# Patient Record
Sex: Female | Born: 1945
Health system: Southern US, Community
[De-identification: ages and names within clinical notes are randomized; demographics above are authoritative.]

## PROBLEM LIST (undated history)

## (undated) DIAGNOSIS — J309 Allergic rhinitis, unspecified: Secondary | ICD-10-CM

## (undated) DIAGNOSIS — K219 Gastro-esophageal reflux disease without esophagitis: Secondary | ICD-10-CM

## (undated) DIAGNOSIS — E119 Type 2 diabetes mellitus without complications: Secondary | ICD-10-CM

## (undated) DIAGNOSIS — Z8679 Personal history of other diseases of the circulatory system: Secondary | ICD-10-CM

## (undated) DIAGNOSIS — E559 Vitamin D deficiency, unspecified: Secondary | ICD-10-CM

## (undated) DIAGNOSIS — J45909 Unspecified asthma, uncomplicated: Secondary | ICD-10-CM

## (undated) DIAGNOSIS — I10 Essential (primary) hypertension: Secondary | ICD-10-CM

## (undated) DIAGNOSIS — K449 Diaphragmatic hernia without obstruction or gangrene: Secondary | ICD-10-CM

## (undated) HISTORY — DX: Personal history of other diseases of the circulatory system: Z86.79

## (undated) HISTORY — DX: Essential (primary) hypertension: I10

## (undated) HISTORY — DX: Vitamin D deficiency, unspecified: E55.9

## (undated) HISTORY — DX: Allergic rhinitis, unspecified: J30.9

## (undated) HISTORY — PX: TUBAL LIGATION: SHX77

---
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
## 1973-04-14 HISTORY — PX: NASAL SEPTUM SURGERY: SHX37

## 1996-04-14 DIAGNOSIS — C50911 Malignant neoplasm of unspecified site of right female breast: Secondary | ICD-10-CM

## 1996-04-14 HISTORY — DX: Malignant neoplasm of unspecified site of right female breast: C50.911

## 1996-04-14 HISTORY — PX: PORTACATH PLACEMENT: SHX2246

## 2000-09-17 ENCOUNTER — Encounter: Payer: Self-pay | Admitting: Family Medicine

## 2000-09-17 ENCOUNTER — Encounter: Admission: RE | Admit: 2000-09-17 | Discharge: 2000-09-17 | Payer: Self-pay | Admitting: Family Medicine

## 2005-04-14 DIAGNOSIS — C50912 Malignant neoplasm of unspecified site of left female breast: Secondary | ICD-10-CM

## 2005-04-14 HISTORY — DX: Malignant neoplasm of unspecified site of left female breast: C50.912

## 2005-10-17 ENCOUNTER — Other Ambulatory Visit: Admission: RE | Admit: 2005-10-17 | Discharge: 2005-10-17 | Payer: Self-pay | Admitting: Family Medicine

## 2007-09-28 ENCOUNTER — Ambulatory Visit (HOSPITAL_BASED_OUTPATIENT_CLINIC_OR_DEPARTMENT_OTHER): Admission: RE | Admit: 2007-09-28 | Discharge: 2007-09-28 | Payer: Self-pay | Admitting: Family Medicine

## 2008-04-14 HISTORY — PX: OTHER SURGICAL HISTORY: SHX169

## 2008-09-06 ENCOUNTER — Other Ambulatory Visit: Admission: RE | Admit: 2008-09-06 | Discharge: 2008-09-06 | Payer: Self-pay | Admitting: Family Medicine

## 2011-01-09 LAB — CREATININE, SERUM
GFR calc Af Amer: >60
GFR calc non Af Amer: >60

## 2011-01-09 LAB — BUN: BUN: 12

## 2014-06-06 DIAGNOSIS — Z853 Personal history of malignant neoplasm of breast: Secondary | ICD-10-CM | POA: Diagnosis not present

## 2014-06-06 DIAGNOSIS — Z0000 Encounter for general adult medical examination without abnormal findings: Secondary | ICD-10-CM | POA: Diagnosis not present

## 2014-06-06 DIAGNOSIS — Z23 Encounter for immunization: Secondary | ICD-10-CM | POA: Diagnosis not present

## 2014-06-06 DIAGNOSIS — I10 Essential (primary) hypertension: Secondary | ICD-10-CM | POA: Diagnosis not present

## 2014-06-06 DIAGNOSIS — E559 Vitamin D deficiency, unspecified: Secondary | ICD-10-CM | POA: Diagnosis not present

## 2014-08-28 DIAGNOSIS — Z853 Personal history of malignant neoplasm of breast: Secondary | ICD-10-CM | POA: Diagnosis not present

## 2014-08-28 DIAGNOSIS — I10 Essential (primary) hypertension: Secondary | ICD-10-CM | POA: Diagnosis not present

## 2014-08-28 DIAGNOSIS — E559 Vitamin D deficiency, unspecified: Secondary | ICD-10-CM | POA: Diagnosis not present

## 2014-08-28 DIAGNOSIS — J309 Allergic rhinitis, unspecified: Secondary | ICD-10-CM | POA: Diagnosis not present

## 2014-08-28 DIAGNOSIS — I472 Ventricular tachycardia: Secondary | ICD-10-CM | POA: Diagnosis not present

## 2014-09-13 DIAGNOSIS — S91112A Laceration without foreign body of left great toe without damage to nail, initial encounter: Secondary | ICD-10-CM | POA: Diagnosis not present

## 2014-09-25 DIAGNOSIS — Z4802 Encounter for removal of sutures: Secondary | ICD-10-CM | POA: Diagnosis not present

## 2014-09-25 DIAGNOSIS — L03032 Cellulitis of left toe: Secondary | ICD-10-CM | POA: Diagnosis not present

## 2015-02-14 DIAGNOSIS — Z23 Encounter for immunization: Secondary | ICD-10-CM | POA: Diagnosis not present

## 2015-06-11 DIAGNOSIS — Z1389 Encounter for screening for other disorder: Secondary | ICD-10-CM | POA: Diagnosis not present

## 2015-06-11 DIAGNOSIS — I10 Essential (primary) hypertension: Secondary | ICD-10-CM | POA: Diagnosis not present

## 2015-06-11 DIAGNOSIS — Z853 Personal history of malignant neoplasm of breast: Secondary | ICD-10-CM | POA: Diagnosis not present

## 2015-06-11 DIAGNOSIS — E559 Vitamin D deficiency, unspecified: Secondary | ICD-10-CM | POA: Diagnosis not present

## 2015-06-11 DIAGNOSIS — Z0000 Encounter for general adult medical examination without abnormal findings: Secondary | ICD-10-CM | POA: Diagnosis not present

## 2015-06-11 DIAGNOSIS — R202 Paresthesia of skin: Secondary | ICD-10-CM | POA: Diagnosis not present

## 2015-07-02 DIAGNOSIS — R05 Cough: Secondary | ICD-10-CM | POA: Diagnosis not present

## 2015-07-02 DIAGNOSIS — J019 Acute sinusitis, unspecified: Secondary | ICD-10-CM | POA: Diagnosis not present

## 2015-07-18 DIAGNOSIS — R109 Unspecified abdominal pain: Secondary | ICD-10-CM | POA: Diagnosis not present

## 2015-07-18 DIAGNOSIS — J019 Acute sinusitis, unspecified: Secondary | ICD-10-CM | POA: Diagnosis not present

## 2015-07-18 DIAGNOSIS — R05 Cough: Secondary | ICD-10-CM | POA: Diagnosis not present

## 2015-09-20 DIAGNOSIS — H2510 Age-related nuclear cataract, unspecified eye: Secondary | ICD-10-CM | POA: Diagnosis not present

## 2015-09-20 DIAGNOSIS — H5200 Hypermetropia, unspecified eye: Secondary | ICD-10-CM | POA: Diagnosis not present

## 2015-09-20 DIAGNOSIS — H5210 Myopia, unspecified eye: Secondary | ICD-10-CM | POA: Diagnosis not present

## 2015-11-07 DIAGNOSIS — H2513 Age-related nuclear cataract, bilateral: Secondary | ICD-10-CM | POA: Diagnosis not present

## 2015-11-07 DIAGNOSIS — H40053 Ocular hypertension, bilateral: Secondary | ICD-10-CM | POA: Diagnosis not present

## 2015-11-07 DIAGNOSIS — Z83511 Family history of glaucoma: Secondary | ICD-10-CM | POA: Diagnosis not present

## 2015-11-07 DIAGNOSIS — H53022 Refractive amblyopia, left eye: Secondary | ICD-10-CM | POA: Diagnosis not present

## 2015-12-31 DIAGNOSIS — R21 Rash and other nonspecific skin eruption: Secondary | ICD-10-CM | POA: Diagnosis not present

## 2015-12-31 DIAGNOSIS — L258 Unspecified contact dermatitis due to other agents: Secondary | ICD-10-CM | POA: Diagnosis not present

## 2015-12-31 DIAGNOSIS — N760 Acute vaginitis: Secondary | ICD-10-CM | POA: Diagnosis not present

## 2015-12-31 DIAGNOSIS — B373 Candidiasis of vulva and vagina: Secondary | ICD-10-CM | POA: Diagnosis not present

## 2015-12-31 DIAGNOSIS — R319 Hematuria, unspecified: Secondary | ICD-10-CM | POA: Diagnosis not present

## 2016-06-16 DIAGNOSIS — J069 Acute upper respiratory infection, unspecified: Secondary | ICD-10-CM | POA: Diagnosis not present

## 2016-06-16 DIAGNOSIS — R6889 Other general symptoms and signs: Secondary | ICD-10-CM | POA: Diagnosis not present

## 2016-07-02 DIAGNOSIS — Z853 Personal history of malignant neoplasm of breast: Secondary | ICD-10-CM | POA: Diagnosis not present

## 2016-07-02 DIAGNOSIS — Z1159 Encounter for screening for other viral diseases: Secondary | ICD-10-CM | POA: Diagnosis not present

## 2016-07-02 DIAGNOSIS — I10 Essential (primary) hypertension: Secondary | ICD-10-CM | POA: Diagnosis not present

## 2016-07-02 DIAGNOSIS — E559 Vitamin D deficiency, unspecified: Secondary | ICD-10-CM | POA: Diagnosis not present

## 2016-07-02 DIAGNOSIS — Z6841 Body Mass Index (BMI) 40.0 and over, adult: Secondary | ICD-10-CM | POA: Diagnosis not present

## 2016-07-02 DIAGNOSIS — Z0000 Encounter for general adult medical examination without abnormal findings: Secondary | ICD-10-CM | POA: Diagnosis not present

## 2017-07-31 DIAGNOSIS — H6091 Unspecified otitis externa, right ear: Secondary | ICD-10-CM | POA: Diagnosis not present

## 2017-07-31 DIAGNOSIS — J209 Acute bronchitis, unspecified: Secondary | ICD-10-CM | POA: Diagnosis not present

## 2018-03-30 DIAGNOSIS — J209 Acute bronchitis, unspecified: Secondary | ICD-10-CM | POA: Diagnosis not present

## 2018-03-30 DIAGNOSIS — J069 Acute upper respiratory infection, unspecified: Secondary | ICD-10-CM | POA: Diagnosis not present

## 2018-04-13 DIAGNOSIS — Z6841 Body Mass Index (BMI) 40.0 and over, adult: Secondary | ICD-10-CM | POA: Diagnosis not present

## 2018-04-13 DIAGNOSIS — J40 Bronchitis, not specified as acute or chronic: Secondary | ICD-10-CM | POA: Diagnosis not present

## 2018-08-06 DIAGNOSIS — I10 Essential (primary) hypertension: Secondary | ICD-10-CM | POA: Diagnosis not present

## 2018-09-27 DIAGNOSIS — Z6841 Body Mass Index (BMI) 40.0 and over, adult: Secondary | ICD-10-CM | POA: Diagnosis not present

## 2018-09-27 DIAGNOSIS — L02412 Cutaneous abscess of left axilla: Secondary | ICD-10-CM | POA: Diagnosis not present

## 2019-01-11 DIAGNOSIS — Z23 Encounter for immunization: Secondary | ICD-10-CM | POA: Diagnosis not present

## 2019-02-02 DIAGNOSIS — Z853 Personal history of malignant neoplasm of breast: Secondary | ICD-10-CM | POA: Diagnosis not present

## 2019-02-02 DIAGNOSIS — R350 Frequency of micturition: Secondary | ICD-10-CM | POA: Diagnosis not present

## 2019-02-02 DIAGNOSIS — E559 Vitamin D deficiency, unspecified: Secondary | ICD-10-CM | POA: Diagnosis not present

## 2019-02-02 DIAGNOSIS — Z0000 Encounter for general adult medical examination without abnormal findings: Secondary | ICD-10-CM | POA: Diagnosis not present

## 2019-02-02 DIAGNOSIS — I10 Essential (primary) hypertension: Secondary | ICD-10-CM | POA: Diagnosis not present

## 2019-02-02 DIAGNOSIS — Z6841 Body Mass Index (BMI) 40.0 and over, adult: Secondary | ICD-10-CM | POA: Diagnosis not present

## 2019-02-02 DIAGNOSIS — R202 Paresthesia of skin: Secondary | ICD-10-CM | POA: Diagnosis not present

## 2019-02-02 DIAGNOSIS — Z1211 Encounter for screening for malignant neoplasm of colon: Secondary | ICD-10-CM | POA: Diagnosis not present

## 2019-02-03 DIAGNOSIS — Z1211 Encounter for screening for malignant neoplasm of colon: Secondary | ICD-10-CM | POA: Diagnosis not present

## 2020-01-24 DIAGNOSIS — Z6841 Body Mass Index (BMI) 40.0 and over, adult: Secondary | ICD-10-CM | POA: Diagnosis not present

## 2020-01-24 DIAGNOSIS — Z0000 Encounter for general adult medical examination without abnormal findings: Secondary | ICD-10-CM | POA: Diagnosis not present

## 2020-01-24 DIAGNOSIS — I10 Essential (primary) hypertension: Secondary | ICD-10-CM | POA: Diagnosis not present

## 2020-01-24 DIAGNOSIS — Z9481 Bone marrow transplant status: Secondary | ICD-10-CM | POA: Diagnosis not present

## 2020-01-24 DIAGNOSIS — Z23 Encounter for immunization: Secondary | ICD-10-CM | POA: Diagnosis not present

## 2020-01-24 DIAGNOSIS — Z1211 Encounter for screening for malignant neoplasm of colon: Secondary | ICD-10-CM | POA: Diagnosis not present

## 2020-01-26 DIAGNOSIS — Z1211 Encounter for screening for malignant neoplasm of colon: Secondary | ICD-10-CM | POA: Diagnosis not present

## 2020-07-17 DIAGNOSIS — R202 Paresthesia of skin: Secondary | ICD-10-CM | POA: Diagnosis not present

## 2020-07-17 DIAGNOSIS — Z6841 Body Mass Index (BMI) 40.0 and over, adult: Secondary | ICD-10-CM | POA: Diagnosis not present

## 2020-07-17 DIAGNOSIS — I10 Essential (primary) hypertension: Secondary | ICD-10-CM | POA: Diagnosis not present

## 2020-07-17 DIAGNOSIS — Z9481 Bone marrow transplant status: Secondary | ICD-10-CM | POA: Diagnosis not present

## 2020-08-02 DIAGNOSIS — Z23 Encounter for immunization: Secondary | ICD-10-CM | POA: Diagnosis not present

## 2020-10-10 ENCOUNTER — Other Ambulatory Visit: Payer: Self-pay

## 2020-10-10 ENCOUNTER — Ambulatory Visit: Payer: Medicare HMO | Admitting: Neurology

## 2020-10-10 ENCOUNTER — Encounter: Payer: Self-pay | Admitting: Neurology

## 2020-10-10 ENCOUNTER — Encounter: Payer: Self-pay | Admitting: *Deleted

## 2020-10-10 VITALS — BP 150/76 | HR 85 | Ht 64.0 in | Wt 224.0 lb

## 2020-10-10 DIAGNOSIS — R292 Abnormal reflex: Secondary | ICD-10-CM | POA: Diagnosis not present

## 2020-10-10 DIAGNOSIS — Z9013 Acquired absence of bilateral breasts and nipples: Secondary | ICD-10-CM

## 2020-10-10 DIAGNOSIS — R270 Ataxia, unspecified: Secondary | ICD-10-CM | POA: Diagnosis not present

## 2020-10-10 DIAGNOSIS — R200 Anesthesia of skin: Secondary | ICD-10-CM

## 2020-10-10 DIAGNOSIS — C50912 Malignant neoplasm of unspecified site of left female breast: Secondary | ICD-10-CM

## 2020-10-10 DIAGNOSIS — R269 Unspecified abnormalities of gait and mobility: Secondary | ICD-10-CM

## 2020-10-10 DIAGNOSIS — R2689 Other abnormalities of gait and mobility: Secondary | ICD-10-CM

## 2020-10-10 DIAGNOSIS — C50911 Malignant neoplasm of unspecified site of right female breast: Secondary | ICD-10-CM

## 2020-10-10 DIAGNOSIS — R29898 Other symptoms and signs involving the musculoskeletal system: Secondary | ICD-10-CM

## 2020-10-10 DIAGNOSIS — R202 Paresthesia of skin: Secondary | ICD-10-CM

## 2020-10-10 DIAGNOSIS — C50919 Malignant neoplasm of unspecified site of unspecified female breast: Secondary | ICD-10-CM | POA: Insufficient documentation

## 2020-10-10 NOTE — Progress Notes (Signed)
[facility]    Provider:  Dr Jaynee Eagles Requesting Provider: Orpah Melter, MD Primary Care Provider:  Orpah Melter, MD  CC:  numbness and tingling in the fingers  HPI:  [name] is a 75 y.o. female here as requested by Orpah Melter, MD for numbness in the fingers (carpal tunnel versus aftereffects of chemotherapy).  Past medical history of paresthesias, morbid obesity, hypertension, right breast cancer 1998 status post modified radical right mastectomy and adjuvant chemotherapy and bone marrow transplant as well as radiation therapy, left breast cancer ductal carcinoma in situ left mastectomy, hysterectomy  Started 4-5 years ago, not coincident with her chemo or radiation in 1998. She worked on the computer for 12 years. Digits 1-4, tingling and numb, numbness is worse at night. Driving makes it worse, she has some neck pain but no radicular symptoms. Dropping things. Feet are ok no sensory symptoms there, no alcohol, progressively getting worse, started in the tips of the fingers, not in the pinkies, nothing makes it better, aggravating but not significantly painful, dropping things makes her mad, feels her coordination. She cannot feel her fingers to button her shirt or make a bow. No blood flow issues, no white fingers or blue fingers ir rashes, some forarm swelling. Some pain in the right deltoid, she feels off balance "like a drunk" she can't stand on one foot, no numbness in the feet. Right hand is worse. Neck pain. Imbalance "walking like a drunk"  Review of Systems: Patient complains of symptoms per HPI as well as the following symptoms imbalance. Pertinent negatives and positives per HPI. All others negative.   Social History   Socioeconomic History   Marital status: Married    Spouse name: Not on file   Number of children: 4   Years of education: Not on file   Highest education level: Some college, no degree  Occupational History   Not on file   Tobacco Use   Smoking status: Former    Packs/day: 1.00    Pack years: 0.00    Types: Cigarettes    Start date: 1962    Quit date: 1998    Years since quitting: 24.5   Smokeless tobacco: Never   Tobacco comments:    At first maybe 1 pack per week then it moved up to 1 ppd later.  Vaping Use   Vaping Use: Never used  Substance and Sexual Activity   Alcohol use: Yes    Comment: 2-3 drinks per week seldom   Drug use: Never   Sexual activity: Not on file  Other Topics Concern   Not on file  Social History Narrative   Lives at home with spouse   Right handed   Caffeine: coffee or tea, 2/day    Social Determinants of Health   Financial Resource Strain: Not on file  Food Insecurity: Not on file  Transportation Needs: Not on file  Physical Activity: Not on file  Stress: Not on file  Social Connections: Not on file  Intimate Partner Violence: Not on file    Family History  Problem Relation Age of Onset   Dementia Mother    Prostate cancer Father    Hypertension Maternal Grandmother    Heart Problems Maternal Grandfather    Diabetes Paternal Grandmother    Heart Problems Paternal Grandmother    Blindness Paternal Grandfather    Breast cancer Paternal Aunt    Neuropathy Neg Hx     Past Medical History:  Diagnosis Date  Allergic rhinitis    Breast cancer, left ([place]) 2007   ductal carcinoma in situ, underwent left mastectomy 07/2005; reconstructive surgery with expander, permanent implant placed 03/2006, hospitalized January 2008 with breast cellulitis.   Breast cancer, right ([place]) [place]; modified radical right mastectomy 1998 for infiltrating ductal adenocarcinoma with 7 of 12 axillary nodes positive. Underwent adjuvant chemotherapy and bone marrow transplant by Dr Marcell Anger as well as radiation therapy. Also had Tram flap reconstruction.   History of ventricular tachycardia    Hypertension    Vitamin D deficiency     Patient Active Problem List   Diagnosis Date  Noted   H/O bilateral mastectomy 10/10/2020   Breast cancer ([place]) 10/10/2020    Past Surgical History:  Procedure Laterality Date   biopsy on scalp  2010   breast implant Left 2007   CESAREAN SECTION     childbirth     63, 66, 67, [place]   hysterectomy with one ovary/due to fibroids  1987   left mastectomy Left 2007   [place]   right mastectomy Right 1998   stem cell infusion  1998   surgery for crossed eyes     TONSILLECTOMY  1961   tran flap R breast Right 1998   tri-port  1998   TUBAL LIGATION      Current Outpatient Medications  Medication Sig Dispense Refill   albuterol (VENTOLIN HFA) 108 (90 Base) MCG/ACT inhaler Inhale 2 puffs into the lungs.     B Complex Vitamins (B COMPLEX PO) Take 1 capsule by mouth.     lisinopril-hydrochlorothiazide (ZESTORETIC) 10-12.5 MG tablet Take 1 tablet by mouth daily.     Multiple Vitamin (MULTIVITAMINS PO) 1 tablet     Naproxen Sodium (ALEVE PO) Take 1 tablet by mouth 2 (two) times daily.     Specialty Vitamins Products (COLLAGEN ULTRA) CAPS See admin instructions.     VITAMIN D PO Take 1,000 Units by mouth in the morning and at bedtime.     No current facility-administered medications for this visit.    Allergies as of 10/10/2020 - Review Complete 10/10/2020  Allergen Reaction Noted   Penicillins Shortness Of Breath and Rash 10/10/2020   Bee venom Swelling 10/10/2020    Vitals: BP (!) 150/76 (BP Location: Left Arm, Patient Position: Sitting, Cuff Size: Large)   Pulse 85   Ht 5\' 4"  (1.626 m)   Wt 224 lb (101.6 kg)   BMI 38.45 kg/m  Last Weight:  Wt Readings from Last 1 Encounters:  10/10/20 224 lb (101.6 kg)   Last Height:   Ht Readings from Last 1 Encounters:  10/10/20 5\' 4"  (1.626 m)     Physical exam: Exam: Gen: NAD, conversant, well nourised, obese, well groomed                     CV: RRR, +SEM . No Carotid Bruits. + peripheral edema, warm,  nontender Eyes: Conjunctivae clear without exudates or hemorrhage  Neuro: Detailed Neurologic Exam  Speech:    Speech is normal; fluent and spontaneous with normal comprehension.  Cognition:    The patient is oriented to person, place, and time;     recent and remote memory intact;     language fluent;     normal attention, concentration,     fund of knowledge Cranial Nerves:    The pupils are equal, round, and  reactive to light. Pupils too small to visualize fundi. Visual fields are full to finger confrontation. Extraocular movements are intact. Trigeminal sensation is intact and the muscles of mastication are normal. The face is symmetric. The palate elevates in the midline. Hearing intact. Voice is normal. Shoulder shrug is normal. The tongue has normal motion without fasciculations.   Coordination:    Normal finger to nose   Gait:    Imbalance with heel/toe, cannot tandem, wide based gait   Motor Observation:    No asymmetry, no atrophy, and no involuntary movements noted. Tone:    Normal muscle tone.    Posture:    Posture is normal.     Strength: right arm prox weakness unclear if due to pain, otherwise strength is V/V in the upper and lower limbs.      Sensation: intact to LT distally in the feet, +Romberg     Reflex Exam:  DTR's: Absent AJs, trace patellars, right biceps 1+, left biceps 2+     Toes:    The toes are equiv  bilaterally.   Clonus:    Clonus is absent.    Assessment/Plan:  Patient with numbness and tingling in the hands which appears to be carpal tunnel but she also has right arm prox weakness, asymmetric reflexes, imbalance when walking, needs further evaluation.   Likely CTS bilaterally: EMG/NCS Imbalance, difficulty with heel/toe walking, ataxia, neck pain: difficult to localize need MRI cervical spine due ot neck pain and assymmteric reflexes to look for myelopathy. Recommended MRi brain as well at this time she wants to hold off.   Orders  Placed This Encounter  Procedures   MR CERVICAL SPINE WO CONTRAST   CBC with Differential/Platelets   Comprehensive metabolic panel   T55 and Folate Panel   Methylmalonic acid, serum   NCV with EMG(electromyography)   No orders of the defined types were placed in this encounter.   Cc: Orpah Melter, MD,  Orpah Melter, MD  Sarina Ill, MD  [facility] [address] [place], [place] [address]  Phone [phone] Fax [phone]

## 2020-10-10 NOTE — Patient Instructions (Signed)
MRI cervical spine EMG/NCS Basic blood work  Nurse, mental health is a test to check how well your muscles and nerves are working. This procedure includes the combined use of electromyogram (EMG) and nerve conduction study (NCS). EMG is used to look for muscular disorders. NCS, which is also called electroneurogram, measures how well your nerves arecontrolling your muscles. The procedures are usually done together to check if your muscles and nerves are healthy. If the results of the tests are abnormal, this may indicatedisease or injury, such as a neuromuscular disease or peripheral nerve damage. Tell a health care provider about: Any allergies you have. All medicines you are taking, including vitamins, herbs, eye drops, creams, and over-the-counter medicines. Any problems you or family members have had with anesthetic medicines. Any blood disorders you have. Any surgeries you have had. Any medical conditions you have. If you have a pacemaker. Whether you are pregnant or may be pregnant. What are the risks? Generally, this is a safe procedure. However, problems may occur, including: Infection where the electrodes were inserted. Bleeding. What happens before the procedure? Medicines Ask your health care provider about: Changing or stopping your regular medicines. This is especially important if you are taking diabetes medicines or blood thinners. Taking medicines such as aspirin and ibuprofen. These medicines can thin your blood. Do not take these medicines unless your health care provider tells you to take them. Taking over-the-counter medicines, vitamins, herbs, and supplements. General instructions Your health care provider may ask you to avoid: Beverages that have caffeine, such as coffee and tea. Any products that contain nicotine or tobacco. These products include cigarettes, e-cigarettes, and chewing tobacco. If you need help quitting, ask your health care  provider. Do not use lotions or creams on the same day that you will be having the procedure. What happens during the procedure? For EMG  Your health care provider will ask you to stay in a position so that he or she can access the muscle that will be studied. You may be standing, sitting, or lying down. You may be given a medicine that numbs the area (local anesthetic). A very thin needle that has an electrode will be inserted into your muscle. Another small electrode will be placed on your skin near the muscle. Your health care provider will ask you to continue to remain still. The electrodes will send a signal that tells about the electrical activity of your muscles. You may see this on a monitor or hear it in the room. After your muscles have been studied at rest, your health care provider will ask you to contract or flex your muscles. The electrodes will send a signal that tells about the electrical activity of your muscles. Your health care provider will remove the electrodes and the electrode needles when the procedure is finished. The procedure may vary among health care providers and hospitals. For NCS  An electrode that records your nerve activity (recording electrode) will be placed on your skin by the muscle that is being studied. An electrode that is used as a reference (reference electrode) will be placed near the recording electrode. A paste or gel will be applied to your skin between the recording electrode and the reference electrode. Your nerve will be stimulated with a mild shock. Your health care provider will measure how much time it takes for your muscle to react. Your health care provider will remove the electrodes and the gel when the procedure is finished. The procedure may vary among health care  providers and hospitals. What happens after the procedure? It is up to you to get the results of your procedure. Ask your health care provider, or the department that is doing  the procedure, when your results will be ready. Your health care provider may: Give you medicines for any pain. Monitor the insertion sites to make sure that bleeding stops. Summary Electromyoneurogram is a test to check how well your muscles and nerves are working. If the results of the tests are abnormal, this may indicate disease or injury. This is a safe procedure. However, problems may occur, such as bleeding and infection. Your health care provider will do two tests to complete this procedure. One checks your muscles (EMG) and another checks your nerves (NCS). It is up to you to get the results of your procedure. Ask your health care provider, or the department that is doing the procedure, when your results will be ready. This information is not intended to replace advice given to you by your health care provider. Make sure you discuss any questions you have with your healthcare provider. Document Revised: 12/15/2017 Document Reviewed: 11/27/2017 Elsevier Patient Education  [place].

## 2020-10-12 LAB — CBC WITH DIFFERENTIAL/PLATELET
Basophils Absolute: 0 10*3/uL (ref 0.0–0.2)
Basos: 1 %
EOS (ABSOLUTE): 0.1 10*3/uL (ref 0.0–0.4)
Eos: 1 %
Hematocrit: 45 % (ref 34.0–46.6)
Hemoglobin: 14.6 g/dL (ref 11.1–15.9)
Immature Grans (Abs): 0 10*3/uL (ref 0.0–0.1)
Immature Granulocytes: 0 %
Lymphocytes Absolute: 1.8 10*3/uL (ref 0.7–3.1)
Lymphs: 27 %
MCH: 27.3 pg (ref 26.6–33.0)
MCHC: 32.4 g/dL (ref 31.5–35.7)
MCV: 84 fL (ref 79–97)
Monocytes Absolute: 0.6 10*3/uL (ref 0.1–0.9)
Monocytes: 9 %
Neutrophils Absolute: 4.2 10*3/uL (ref 1.4–7.0)
Neutrophils: 62 %
Platelets: 183 10*3/uL (ref 150–450)
RBC: 5.34 x10E6/uL — ABNORMAL HIGH (ref 3.77–5.28)
RDW: 14.2 % (ref 11.7–15.4)
WBC: 6.7 10*3/uL (ref 3.4–10.8)

## 2020-10-12 LAB — COMPREHENSIVE METABOLIC PANEL
ALT: 28 IU/L (ref 0–32)
AST: 44 IU/L — ABNORMAL HIGH (ref 0–40)
Albumin/Globulin Ratio: 1.8 (ref 1.2–2.2)
Albumin: 4.4 g/dL (ref 3.7–4.7)
Alkaline Phosphatase: 74 IU/L (ref 44–121)
BUN/Creatinine Ratio: 20 (ref 12–28)
BUN: 13 mg/dL (ref 8–27)
Bilirubin Total: 0.4 mg/dL (ref 0.0–1.2)
CO2: 25 mmol/L (ref 20–29)
Calcium: 9.8 mg/dL (ref 8.7–10.3)
Chloride: 97 mmol/L (ref 96–106)
Creatinine, Ser: 0.66 mg/dL (ref 0.57–1.00)
Globulin, Total: 2.5 g/dL (ref 1.5–4.5)
Glucose: 149 mg/dL — ABNORMAL HIGH (ref 65–99)
Potassium: 4.2 mmol/L (ref 3.5–5.2)
Sodium: 137 mmol/L (ref 134–144)
Total Protein: 6.9 g/dL (ref 6.0–8.5)
eGFR: 91 mL/min/{1.73_m2} (ref >59)

## 2020-10-12 LAB — B12 AND FOLATE PANEL
Folate: >20 ng/mL (ref >3.0)
Vitamin B-12: 515 pg/mL (ref 232–1245)

## 2020-10-12 LAB — METHYLMALONIC ACID, SERUM: Methylmalonic Acid: 193 nmol/L (ref 0–378)

## 2020-10-16 ENCOUNTER — Telehealth: Payer: Self-pay | Admitting: *Deleted

## 2020-10-16 NOTE — Telephone Encounter (Signed)
Called pt and advised lab results unremarkable. She verbalized understanding and appreciation. Her questions were answered.

## 2020-10-16 NOTE — Telephone Encounter (Signed)
-----   Message from Melvenia Beam, MD sent at 10/11/2020  9:09 AM EDT ----- Unremarkable blood work. Thanks, dr Jaynee Eagles

## 2020-10-25 ENCOUNTER — Other Ambulatory Visit: Payer: Self-pay

## 2020-10-25 ENCOUNTER — Ambulatory Visit: Payer: Medicare HMO | Admitting: Neurology

## 2020-10-25 ENCOUNTER — Ambulatory Visit (INDEPENDENT_AMBULATORY_CARE_PROVIDER_SITE_OTHER): Payer: Medicare HMO | Admitting: Neurology

## 2020-10-25 DIAGNOSIS — G5603 Carpal tunnel syndrome, bilateral upper limbs: Secondary | ICD-10-CM | POA: Diagnosis not present

## 2020-10-25 DIAGNOSIS — R200 Anesthesia of skin: Secondary | ICD-10-CM

## 2020-10-25 DIAGNOSIS — Z0289 Encounter for other administrative examinations: Secondary | ICD-10-CM

## 2020-10-25 NOTE — Progress Notes (Addendum)
Full Name: [name] Gender: Female MRN #: [id number] Date of Birth: 1945-11-03    Visit Date: 10/25/2020 08:35 Age: 75 Years Examining Physician: Sarina Ill, MD  Requesting Provider: Orpah Melter, MD Primary Care Provider:  Orpah Melter, MD  History: Patient with numbness and tingling in the hands   Summary: Nerve Conduction Studies were performed on the bilateral upper extremities.  The right median APB motor nerve showed prolonged distal onset latency (13 ms, N<4.4) and reduced amplitude(0.6 mV, N<4). The left  median APB motor nerve showed NR. The right Median 2nd Digit orthodromic sensory nerve showed NR. The left  Median 2nd Digit orthodromic sensory nerve showed NR.  The right median/ulnar (palm) comparison nerve showed prolonged distal peak latency (Median Palm, 6.1 ms, N<2.2) and abnormal peak latency difference (Median Palm-Ulnar Palm, 4.0 ms, N<0.4) with a relative median delay.    The left median/ulnar (palm) comparison nerve showed prolonged distal peak latency (Median Palm, 5.6 ms, N<2.2) and abnormal peak latency difference (Median Palm-Ulnar Palm, 3.6 ms, N<0.4) with a relative median delay.    All remaining nerves (as indicated in the following tables) were within normal limits.  All muscles (as indicated in the following tables) were within normal limits.     Conclusion: This is an abnormal study. There is electrophysiologic evidence of bilateral moderately-severe Carpal Tunnel Syndrome. No suggestion of polyneuropathy or radiculopathy.   Orders Placed This Encounter  Procedures   Ambulatory referral to Orthopedic Surgery        ------------------------------- Sarina Ill, M.D.  [facility] [address], [place],  [address] Tel: [phone] Fax: [phone]  Verbal informed consent was obtained from the patient, patient was informed of potential risk of procedure, including bruising, bleeding, hematoma  formation, infection, muscle weakness, muscle pain, numbness, among others.        [place]    Nerve / Sites Muscle Latency Ref. Amplitude Ref. Rel Amp Segments Distance Velocity Ref. Area    ms ms mV mV %  cm m/s m/s mVms  R Median - APB     Wrist APB 13.0 ?4.4 0.6 ?4.0 100 Wrist - APB 7   1.6     Upper arm APB 17.1  0.2  33.3 Upper arm - Wrist 20 49 ?49 0.8  L Median - APB     Wrist APB NR ?4.4 NR ?4.0 NR Wrist - APB 7   NR     Upper arm APB NR  NR  NR Upper arm - Wrist 20 NR ?49 NR  R Ulnar - ADM     Wrist ADM 2.7 ?3.3 8.3 ?6.0 100 Wrist - ADM 7   26.3     B.Elbow ADM 5.5  7.1  85.8 B.Elbow - Wrist 17 60 ?49 22.9     A.Elbow ADM 7.3  6.3  88.5 A.Elbow - B.Elbow 10 54 ?49 19.2           SNC    Nerve / Sites Rec. Site Peak Lat Ref.  Amp Ref. Segments Distance Peak Diff Ref.    ms ms V V  cm ms ms  R Median, Ulnar - Transcarpal comparison     Median Palm Wrist 6.1 ?2.2 13 ?35 Median Palm - Wrist 8       Ulnar Palm Wrist 2.1 ?2.2 12 ?12 Ulnar Palm - Wrist 8          Median Palm - Ulnar Palm  4.0 ?0.4  L Median, Ulnar - Transcarpal comparison     Median Palm Wrist 5.6 ?2.2 11 ?35 Median Palm - Wrist 8       Ulnar Palm Wrist 2.1 ?2.2 14 ?12 Ulnar Palm - Wrist 8          Median Palm - Ulnar Palm  3.6 ?0.4  R Median - Orthodromic (Dig II, Mid palm)     Dig II Wrist NR ?3.4 NR ?10 Dig II - Wrist 13    L Median - Orthodromic (Dig II, Mid palm)     Dig II Wrist NR ?3.4 NR ?10 Dig II - Wrist 13    R Ulnar - Orthodromic, (Dig V, Mid palm)     Dig V Wrist 2.8 ?3.1 5 ?5 Dig V - Wrist 80                 F  Wave    Nerve F Lat Ref.   ms ms  R Ulnar - ADM 26.9 ?32.0       EMG Summary Table    Spontaneous MUAP Recruitment  Muscle IA Fib PSW Fasc Other Amp Dur. Poly Pattern  L. Cervical paraspinals (low) Normal None None None _______ Normal Normal Normal Normal  R. Cervical paraspinals (low) Normal None None None _______ Normal Normal Normal Normal  L. Deltoid Normal None None None  _______ Normal Normal Normal Normal  R. Deltoid Normal None None None _______ Normal Normal Normal Normal  L. Triceps brachii Normal None None None _______ Normal Normal Normal Normal  R. Triceps brachii Normal None None None _______ Normal Normal Normal Normal  L. Pronator teres Normal None None None _______ Normal Normal Normal Normal  R. Pronator teres Normal None None None _______ Normal Normal Normal Normal  L. First dorsal interosseous Normal None None None _______ Normal Normal Normal Normal  R. First dorsal interosseous Normal None None None _______ Normal Normal Normal Normal  L. Opponens pollicis Normal None None None _______ Normal Normal Normal Normal  R. Opponens pollicis Normal None None None _______ Normal Normal Normal Normal

## 2020-10-25 NOTE — Progress Notes (Signed)
See procedure note.

## 2020-10-25 NOTE — Patient Instructions (Signed)

## 2020-10-28 NOTE — Addendum Note (Signed)
Addended by: Sarina Ill B on: 10/28/2020 11:03 AM   Modules accepted: Orders

## 2020-10-28 NOTE — Procedures (Signed)
Full Name: [name] Gender: Female MRN #: [id number] Date of Birth: 1946/03/04    Visit Date: 10/25/2020 08:35 Age: 75 Years Examining Physician: Sarina Ill, MD  Requesting Provider: Orpah Melter, MD Primary Care Provider:  Orpah Melter, MD  History: Patient with numbness and tingling in the hands   Summary: Nerve Conduction Studies were performed on the bilateral upper extremities.  The right median APB motor nerve showed prolonged distal onset latency (13 ms, N<4.4) and reduced amplitude(0.6 mV, N<4). The left  median APB motor nerve showed NR. The right Median 2nd Digit orthodromic sensory nerve showed NR. The left  Median 2nd Digit orthodromic sensory nerve showed NR.  The right median/ulnar (palm) comparison nerve showed prolonged distal peak latency (Median Palm, 6.1 ms, N<2.2) and abnormal peak latency difference (Median Palm-Ulnar Palm, 4.0 ms, N<0.4) with a relative median delay.    The left median/ulnar (palm) comparison nerve showed prolonged distal peak latency (Median Palm, 5.6 ms, N<2.2) and abnormal peak latency difference (Median Palm-Ulnar Palm, 3.6 ms, N<0.4) with a relative median delay.    All remaining nerves (as indicated in the following tables) were within normal limits.  All muscles (as indicated in the following tables) were within normal limits.     Conclusion: This is an abnormal study. There is electrophysiologic evidence of bilateral moderately-severe Carpal Tunnel Syndrome. No suggestion of polyneuropathy or radiculopathy.      ------------------------------- Sarina Ill, M.D.  [facility] [address], [place], [place] [address] Tel: [phone] Fax: [phone]  Verbal informed consent was obtained from the patient, patient was informed of potential risk of procedure, including bruising, bleeding, hematoma formation, infection, muscle weakness, muscle pain, numbness, among others.        [place]     Nerve / Sites Muscle Latency Ref. Amplitude Ref. Rel Amp Segments Distance Velocity Ref. Area    ms ms mV mV %  cm m/s m/s mVms  R Median - APB     Wrist APB 13.0 ?4.4 0.6 ?4.0 100 Wrist - APB 7   1.6     Upper arm APB 17.1  0.2  33.3 Upper arm - Wrist 20 49 ?49 0.8  L Median - APB     Wrist APB NR ?4.4 NR ?4.0 NR Wrist - APB 7   NR     Upper arm APB NR  NR  NR Upper arm - Wrist 20 NR ?49 NR  R Ulnar - ADM     Wrist ADM 2.7 ?3.3 8.3 ?6.0 100 Wrist - ADM 7   26.3     B.Elbow ADM 5.5  7.1  85.8 B.Elbow - Wrist 17 60 ?49 22.9     A.Elbow ADM 7.3  6.3  88.5 A.Elbow - B.Elbow 10 54 ?49 19.2           SNC    Nerve / Sites Rec. Site Peak Lat Ref.  Amp Ref. Segments Distance Peak Diff Ref.    ms ms V V  cm ms ms  R Median, Ulnar - Transcarpal comparison     Median Palm Wrist 6.1 ?2.2 13 ?35 Median Palm - Wrist 8       Ulnar Palm Wrist 2.1 ?2.2 12 ?12 Ulnar Palm - Wrist 8          Median Palm - Ulnar Palm  4.0 ?0.4  L Median, Ulnar - Transcarpal comparison     Median Palm Wrist 5.6 ?2.2 11 ?35  Median Palm - Wrist 8       Ulnar Palm Wrist 2.1 ?2.2 14 ?12 Ulnar Palm - Wrist 8          Median Palm - Ulnar Palm  3.6 ?0.4  R Median - Orthodromic (Dig II, Mid palm)     Dig II Wrist NR ?3.4 NR ?10 Dig II - Wrist 13    L Median - Orthodromic (Dig II, Mid palm)     Dig II Wrist NR ?3.4 NR ?10 Dig II - Wrist 13    R Ulnar - Orthodromic, (Dig V, Mid palm)     Dig V Wrist 2.8 ?3.1 5 ?5 Dig V - Wrist 25                 F  Wave    Nerve F Lat Ref.   ms ms  R Ulnar - ADM 26.9 ?32.0       EMG Summary Table    Spontaneous MUAP Recruitment  Muscle IA Fib PSW Fasc Other Amp Dur. Poly Pattern  L. Cervical paraspinals (low) Normal None None None _______ Normal Normal Normal Normal  R. Cervical paraspinals (low) Normal None None None _______ Normal Normal Normal Normal  L. Deltoid Normal None None None _______ Normal Normal Normal Normal  R. Deltoid Normal None None None _______ Normal Normal Normal  Normal  L. Triceps brachii Normal None None None _______ Normal Normal Normal Normal  R. Triceps brachii Normal None None None _______ Normal Normal Normal Normal  L. Pronator teres Normal None None None _______ Normal Normal Normal Normal  R. Pronator teres Normal None None None _______ Normal Normal Normal Normal  L. First dorsal interosseous Normal None None None _______ Normal Normal Normal Normal  R. First dorsal interosseous Normal None None None _______ Normal Normal Normal Normal  L. Opponens pollicis Normal None None None _______ Normal Normal Normal Normal  R. Opponens pollicis Normal None None None _______ Normal Normal Normal Normal

## 2020-10-29 ENCOUNTER — Telehealth: Payer: Self-pay | Admitting: Neurology

## 2020-10-29 NOTE — Telephone Encounter (Signed)
Referral faxed to [facility] for Dr. Amedeo Plenty. Phone: [phone].

## 2020-11-03 ENCOUNTER — Other Ambulatory Visit: Payer: Self-pay

## 2020-11-03 ENCOUNTER — Ambulatory Visit
Admission: RE | Admit: 2020-11-03 | Discharge: 2020-11-03 | Disposition: A | Discharge status: Home / Self Care | Payer: Self-pay | Source: Ambulatory Visit | Attending: Neurology | Admitting: Neurology

## 2020-11-03 DIAGNOSIS — R2689 Other abnormalities of gait and mobility: Secondary | ICD-10-CM

## 2020-11-03 DIAGNOSIS — R200 Anesthesia of skin: Secondary | ICD-10-CM | POA: Diagnosis not present

## 2020-11-03 DIAGNOSIS — R270 Ataxia, unspecified: Secondary | ICD-10-CM

## 2020-11-03 DIAGNOSIS — R269 Unspecified abnormalities of gait and mobility: Secondary | ICD-10-CM

## 2020-11-03 DIAGNOSIS — R202 Paresthesia of skin: Secondary | ICD-10-CM

## 2020-11-03 DIAGNOSIS — R29898 Other symptoms and signs involving the musculoskeletal system: Secondary | ICD-10-CM

## 2020-11-03 DIAGNOSIS — R292 Abnormal reflex: Secondary | ICD-10-CM

## 2020-12-13 DIAGNOSIS — G5603 Carpal tunnel syndrome, bilateral upper limbs: Secondary | ICD-10-CM | POA: Diagnosis not present

## 2021-01-04 DIAGNOSIS — G5601 Carpal tunnel syndrome, right upper limb: Secondary | ICD-10-CM | POA: Diagnosis not present

## 2021-01-15 DIAGNOSIS — G5601 Carpal tunnel syndrome, right upper limb: Secondary | ICD-10-CM | POA: Diagnosis not present

## 2021-01-31 DIAGNOSIS — I10 Essential (primary) hypertension: Secondary | ICD-10-CM | POA: Diagnosis not present

## 2021-01-31 DIAGNOSIS — Z23 Encounter for immunization: Secondary | ICD-10-CM | POA: Diagnosis not present

## 2021-01-31 DIAGNOSIS — Z0000 Encounter for general adult medical examination without abnormal findings: Secondary | ICD-10-CM | POA: Diagnosis not present

## 2021-01-31 DIAGNOSIS — Z86010 Personal history of colonic polyps: Secondary | ICD-10-CM | POA: Diagnosis not present

## 2021-01-31 DIAGNOSIS — R32 Unspecified urinary incontinence: Secondary | ICD-10-CM | POA: Diagnosis not present

## 2021-03-15 DIAGNOSIS — N3946 Mixed incontinence: Secondary | ICD-10-CM | POA: Diagnosis not present

## 2021-03-15 DIAGNOSIS — R351 Nocturia: Secondary | ICD-10-CM | POA: Diagnosis not present

## 2021-03-15 DIAGNOSIS — R350 Frequency of micturition: Secondary | ICD-10-CM | POA: Diagnosis not present

## 2021-04-25 DIAGNOSIS — R350 Frequency of micturition: Secondary | ICD-10-CM | POA: Diagnosis not present

## 2021-04-25 DIAGNOSIS — N3946 Mixed incontinence: Secondary | ICD-10-CM | POA: Diagnosis not present

## 2021-05-06 ENCOUNTER — Other Ambulatory Visit: Payer: Self-pay

## 2021-05-06 ENCOUNTER — Encounter (HOSPITAL_COMMUNITY): Payer: Self-pay

## 2021-05-06 ENCOUNTER — Emergency Department (HOSPITAL_COMMUNITY)
Admission: EM | Admit: 2021-05-06 | Discharge: 2021-05-06 | Disposition: A | Discharge status: Home / Self Care | Payer: No Typology Code available for payment source | Attending: Emergency Medicine | Admitting: Emergency Medicine

## 2021-05-06 DIAGNOSIS — W228XXA Striking against or struck by other objects, initial encounter: Secondary | ICD-10-CM | POA: Diagnosis not present

## 2021-05-06 DIAGNOSIS — Y900 Blood alcohol level of less than 20 mg/100 ml: Secondary | ICD-10-CM | POA: Diagnosis not present

## 2021-05-06 DIAGNOSIS — R58 Hemorrhage, not elsewhere classified: Secondary | ICD-10-CM | POA: Diagnosis not present

## 2021-05-06 DIAGNOSIS — S8992XA Unspecified injury of left lower leg, initial encounter: Secondary | ICD-10-CM | POA: Diagnosis present

## 2021-05-06 DIAGNOSIS — Z23 Encounter for immunization: Secondary | ICD-10-CM | POA: Insufficient documentation

## 2021-05-06 DIAGNOSIS — S81812A Laceration without foreign body, left lower leg, initial encounter: Secondary | ICD-10-CM | POA: Diagnosis not present

## 2021-05-06 DIAGNOSIS — I10 Essential (primary) hypertension: Secondary | ICD-10-CM | POA: Diagnosis not present

## 2021-05-06 MED ORDER — TETANUS-DIPHTH-ACELL PERTUSSIS 5-2.5-18.5 LF-MCG/0.5 IM SUSY
0.5000 mL | PREFILLED_SYRINGE | Freq: Once | INTRAMUSCULAR | Status: AC
  Administered 2021-05-06: 0.5 mL via INTRAMUSCULAR
  Filled 2021-05-06: qty 0.5

## 2021-05-06 MED ORDER — LIDOCAINE-EPINEPHRINE (PF) 2 %-1:200000 IJ SOLN
10.0000 mL | Freq: Once | INTRAMUSCULAR | Status: AC
  Administered 2021-05-06: 10 mL
  Filled 2021-05-06: qty 20

## 2021-05-06 NOTE — ED Triage Notes (Signed)
Patient was at work, hit left lower leg on desk drawer, laceration to noted to left shin area.

## 2021-05-06 NOTE — Discharge Instructions (Signed)

## 2021-05-06 NOTE — ED Provider Notes (Signed)
[facility] EMERGENCY DEPARTMENT Provider Note   CSN: [id number] Arrival date & time: 05/06/21  1412     History  Chief Complaint  Patient presents with   Laceration    Jessica KAUFMANN is a 76 y.o. female who presents emergency department with a chief complaint of laceration to the left lower extremity.  She was at work today when she hit her leg on the corner of a desk suffering triangular laceration to the left lower extremity.  She is not up-to-date on her tetanus immunization.  She has no active bleeding at this time.   Laceration     Home Medications Prior to Admission medications   Medication Sig Start Date End Date Taking? Authorizing Provider  albuterol (VENTOLIN HFA) 108 (90 Base) MCG/ACT inhaler Inhale 2 puffs into the lungs. 04/13/18   [provider]  B Complex Vitamins (B COMPLEX PO) Take 1 capsule by mouth.    [provider]  lisinopril-hydrochlorothiazide (ZESTORETIC) 10-12.5 MG tablet Take 1 tablet by mouth daily. 07/10/20   [provider]  Multiple Vitamin (MULTIVITAMINS PO) 1 tablet    [provider]  Naproxen Sodium (ALEVE PO) Take 1 tablet by mouth 2 (two) times daily.    [provider]  Specialty Vitamins Products (COLLAGEN ULTRA) CAPS See admin instructions.    [provider]  VITAMIN D PO Take 1,000 Units by mouth in the morning and at bedtime. 09/07/08   [provider]      Allergies    Penicillins and Bee venom    Review of Systems   Review of Systems Per HPI Physical Exam Updated Vital Signs BP (!) 163/77 (BP Location: Left Arm)    Pulse 85    Temp 98.6 F (37 C) (Oral)    Ht 5\' 4"  (1.626 m)    Wt 100.7 kg    SpO2 98%    BMI 38.11 kg/m  Physical Exam Vitals and nursing note reviewed.  Constitutional:      General: She is not in acute distress.    Appearance: She is well-developed. She is not diaphoretic.  HENT:     Head: Normocephalic and atraumatic.     Right Ear: External  ear normal.     Left Ear: External ear normal.     Nose: Nose normal.     Mouth/Throat:     Mouth: Mucous membranes are moist.  Eyes:     General: No scleral icterus.    Conjunctiva/sclera: Conjunctivae normal.  Cardiovascular:     Rate and Rhythm: Normal rate and regular rhythm.     Heart sounds: Normal heart sounds. No murmur heard.   No friction rub. No gallop.  Pulmonary:     Effort: Pulmonary effort is normal. No respiratory distress.     Breath sounds: Normal breath sounds.  Abdominal:     General: Bowel sounds are normal. There is no distension.     Palpations: Abdomen is soft. There is no mass.     Tenderness: There is no abdominal tenderness. There is no guarding.  Musculoskeletal:     Cervical back: Normal range of motion.  Skin:    General: Skin is warm and dry.     Comments: 6 cm triangular laceration of the left lower extremity  Neurological:     Mental Status: She is alert and oriented to person, place, and time.  Psychiatric:        Behavior: Behavior normal.    ED Results / Procedures /  Treatments   Labs (all labs ordered are listed, but only abnormal results are displayed) Labs Reviewed - No data to display  EKG None  Radiology No results found.  Procedures .Marland KitchenLaceration Repair  Date/Time: 05/06/2021 4:35 PM Performed by: Margarita Mail, PA-C Authorized by: Margarita Mail, PA-C   Consent:    Consent obtained:  Verbal   Risks discussed:  Infection, pain, poor cosmetic result, retained foreign body and need for additional repair   Alternatives discussed:  No treatment Universal protocol:    Patient identity confirmed:  Verbally with patient Anesthesia:    Anesthesia method:  Local infiltration   Local anesthetic:  Lidocaine 2% WITH epi Laceration details:    Location:  Leg   Leg location:  L lower leg   Length (cm):  6 Pre-procedure details:    Preparation:  Patient was prepped and draped in usual sterile fashion Exploration:    Wound  exploration: wound explored through full range of motion and entire depth of wound visualized   Treatment:    Area cleansed with:  Chlorhexidine   Amount of cleaning:  Standard   Irrigation solution:  Sterile saline Skin repair:    Repair method:  Sutures   Suture size:  3-0   Suture material:  Nylon   Suture technique:  Simple interrupted   Number of sutures:  6 Repair type:    Repair type:  Simple Post-procedure details:    Dressing:  Bulky dressing   Procedure completion:  Tolerated well, no immediate complications    Medications Ordered in ED Medications - No data to display  ED Course/ Medical Decision Making/ A&P                           Medical Decision Making Patient here with simple leg laceration.  I considered an x-ray to look for foreign body however patient bumped her leg on the edge of a table do not feel that she has significant risk for foreign body in the wound.  Wound repaired under sterile conditions, flushed and irrigated, no evidence of retained foreign bodies on physical examination.  6 sutures placed.  Bulky dressing applied.  Patient may have her sutures removed in 7 days.  She was given outpatient follow-up and return precautions as well as home care.  Patient appears appropriate for discharge at this time, tetanus vaccine updated  Amount and/or Complexity of Data Reviewed Independent Historian: spouse  Risk Prescription drug management.    Final Clinical Impression(s) / ED Diagnoses Final diagnoses:  None    Rx / DC Orders ED Discharge Orders     None         Margarita Mail, PA-C 05/06/21 1637    Milton Ferguson, MD 05/08/21 [id number]

## 2021-05-23 DIAGNOSIS — R350 Frequency of micturition: Secondary | ICD-10-CM | POA: Diagnosis not present

## 2021-05-31 DIAGNOSIS — R350 Frequency of micturition: Secondary | ICD-10-CM | POA: Diagnosis not present

## 2021-05-31 DIAGNOSIS — N3946 Mixed incontinence: Secondary | ICD-10-CM | POA: Diagnosis not present

## 2021-06-04 DIAGNOSIS — R194 Change in bowel habit: Secondary | ICD-10-CM | POA: Diagnosis not present

## 2021-06-04 DIAGNOSIS — R1310 Dysphagia, unspecified: Secondary | ICD-10-CM | POA: Diagnosis not present

## 2021-06-04 DIAGNOSIS — Z86010 Personal history of colonic polyps: Secondary | ICD-10-CM | POA: Diagnosis not present

## 2021-06-04 DIAGNOSIS — Z9481 Bone marrow transplant status: Secondary | ICD-10-CM | POA: Diagnosis not present

## 2021-06-06 ENCOUNTER — Other Ambulatory Visit (HOSPITAL_COMMUNITY): Payer: Self-pay | Admitting: *Deleted

## 2021-06-06 DIAGNOSIS — R1310 Dysphagia, unspecified: Secondary | ICD-10-CM

## 2021-06-07 DIAGNOSIS — R194 Change in bowel habit: Secondary | ICD-10-CM | POA: Diagnosis not present

## 2021-06-18 ENCOUNTER — Ambulatory Visit (HOSPITAL_COMMUNITY)
Admission: RE | Admit: 2021-06-18 | Discharge: 2021-06-18 | Disposition: A | Discharge status: Home / Self Care | Payer: Medicare HMO | Source: Ambulatory Visit | Attending: Gastroenterology | Admitting: Gastroenterology

## 2021-06-18 ENCOUNTER — Other Ambulatory Visit: Payer: Self-pay

## 2021-06-18 DIAGNOSIS — R1310 Dysphagia, unspecified: Secondary | ICD-10-CM | POA: Diagnosis not present

## 2021-06-18 DIAGNOSIS — R1312 Dysphagia, oropharyngeal phase: Secondary | ICD-10-CM | POA: Insufficient documentation

## 2021-06-18 NOTE — Progress Notes (Signed)
Modified Barium Swallow Progress Note ? ?Patient Details  ?Name: [name] ?MRN: [id number] ?Date of Birth: 03/15/1946 ? ?Today's Date: 06/18/2021 ? ?Modified Barium Swallow completed.  Full report located under Chart Review in the Imaging Section. ? ?Brief recommendations include the following: ? ?Clinical Impression ? Pt's oropharyngeal swallowing is WFL with no overt difficulties to explain her symptoms. Given her subjective report and h/o GERD, SLP provided her with esophageal handout with precautions listed. Would defer any additional f/u including consideration for more esophageal w/u to MD. ?  ?Swallow Evaluation Recommendations ? ? Recommended Consults: Consider esophageal assessment;Consider GI evaluation ? ? SLP Diet Recommendations: Regular solids;Thin liquid ? ? Liquid Administration via: Cup;Straw ? ? Medication Administration: Whole meds with liquid ? ? Supervision: Patient able to self feed ? ? Compensations: Slow rate;Small sips/bites;Follow solids with liquid ? ? Postural Changes: Seated upright at 90 degrees;Remain semi-upright after after feeds/meals (Comment) ? ? Oral Care Recommendations: Oral care BID ? ?   ? ? ? ?Osie Bond., M.A. CCC-SLP ?Acute Rehabilitation Services ?Pager [phone] ?Office [phone] ? ?06/18/2021,1:17 PM ?

## 2021-07-24 DIAGNOSIS — R350 Frequency of micturition: Secondary | ICD-10-CM | POA: Diagnosis not present

## 2021-07-24 DIAGNOSIS — N3946 Mixed incontinence: Secondary | ICD-10-CM | POA: Diagnosis not present

## 2021-08-06 ENCOUNTER — Encounter (HOSPITAL_BASED_OUTPATIENT_CLINIC_OR_DEPARTMENT_OTHER): Payer: Medicare HMO | Attending: Internal Medicine | Admitting: Internal Medicine

## 2021-08-06 DIAGNOSIS — I872 Venous insufficiency (chronic) (peripheral): Secondary | ICD-10-CM | POA: Diagnosis not present

## 2021-08-06 DIAGNOSIS — I10 Essential (primary) hypertension: Secondary | ICD-10-CM

## 2021-08-06 DIAGNOSIS — G8929 Other chronic pain: Secondary | ICD-10-CM | POA: Diagnosis not present

## 2021-08-06 DIAGNOSIS — L080 Pyoderma: Secondary | ICD-10-CM | POA: Diagnosis not present

## 2021-08-06 DIAGNOSIS — C50919 Malignant neoplasm of unspecified site of unspecified female breast: Secondary | ICD-10-CM | POA: Insufficient documentation

## 2021-08-06 DIAGNOSIS — I890 Lymphedema, not elsewhere classified: Secondary | ICD-10-CM

## 2021-08-06 DIAGNOSIS — L97812 Non-pressure chronic ulcer of other part of right lower leg with fat layer exposed: Secondary | ICD-10-CM | POA: Diagnosis not present

## 2021-08-06 DIAGNOSIS — B957 Other staphylococcus as the cause of diseases classified elsewhere: Secondary | ICD-10-CM | POA: Diagnosis not present

## 2021-08-06 DIAGNOSIS — Z87891 Personal history of nicotine dependence: Secondary | ICD-10-CM | POA: Insufficient documentation

## 2021-08-06 NOTE — Progress Notes (Signed)
SHENIKA, QUINT ([id number]) ?Visit Report for 08/06/2021 ?Allergy List Details ?Patient Name: Date of Service: ?[name], [name] 08/06/2021 9:00 A M ?Medical Record Number: [id number] ?Patient Account Number: [id number] ?Date of Birth/Sex: Treating RN: ?15-Apr-1945 (76 y.o. F) [name], [name] ?Primary Care Crews Mccollam: [name] Other Clinician: ?Referring Kabir Brannock: ?Treating Cathrine Krizan/Extender: Kalman Shan ?Orpah Melter C ?Weeks in Treatment: 0 ?Allergies ?Active Allergies ?penicillin ?Reaction: rash ?Allergy Notes ?Electronic Signature(s) ?Signed: 08/06/2021 5:13:01 PM By: Deon Pilling RN, BSN ?Entered By: Deon Pilling on 08/06/2021 09:12:46 ?-------------------------------------------------------------------------------- ?Arrival Information Details ?Patient Name: Date of Service: ?[name], [name] 08/06/2021 9:00 A M ?Medical Record Number: [id number] ?Patient Account Number: [id number] ?Date of Birth/Sex: Treating RN: ?09/23/1945 (76 y.o. F) [name], [name] ?Primary Care Daison Braxton: [name] Other Clinician: ?Referring Carri Spillers: ?Treating Satcha Storlie/Extender: Kalman Shan ?Orpah Melter C ?Weeks in Treatment: 0 ?Visit Information ?Patient Arrived: [name] ?Arrival Time: 09:09 ?Accompanied By: self ?Transfer Assistance: None ?Patient Identification Verified: Yes ?Secondary Verification Process Completed: Yes ?Patient Requires Transmission-Based Precautions: No ?Patient Has Alerts: No ?Electronic Signature(s) ?Signed: 08/06/2021 5:13:01 PM By: Deon Pilling RN, BSN ?Entered By: Deon Pilling on 08/06/2021 09:11:49 ?-------------------------------------------------------------------------------- ?Clinic Level of Care Assessment Details ?Patient Name: Date of Service: ?[name], [name] 08/06/2021 9:00 A M ?Medical Record Number: [id number] ?Patient Account Number: [id number] ?Date of Birth/Sex: Treating RN: ?[birth year] (76 y.o. F) [name], [name] ?Primary Care Carlyon Nolasco: [name] Other  Clinician: ?Referring Shakaya Bhullar: ?Treating Willadene Mounsey/Extender: Kalman Shan ?Orpah Melter C ?Weeks in Treatment: 0 ?Clinic Level of Care Assessment Items ?TOOL 1 Quantity Score ?X- 1 0 ?Use when EandM and Procedure is performed on INITIAL visit ?ASSESSMENTS - Nursing Assessment / Reassessment ?X- 1 20 ?General Physical Exam (combine w/ comprehensive assessment (listed just below) when performed on new pt. evals) ?X- 1 25 ?Comprehensive Assessment (HX, ROS, Risk Assessments, Wounds Hx, etc.) ?ASSESSMENTS - Wound and Skin Assessment / Reassessment ?X- 1 10 ?Dermatologic / Skin Assessment (not related to wound area) ?ASSESSMENTS - Ostomy and/or Continence Assessment and Care ?'[]'$  - 0 ?Incontinence Assessment and Management ?'[]'$  - 0 ?Ostomy Care Assessment and Management (repouching, etc.) ?PROCESS - Coordination of Care ?X - Simple Patient / Family Education for ongoing care 1 15 ?'[]'$  - 0 ?Complex (extensive) Patient / Family Education for ongoing care ?X- 1 10 ?Staff obtains Consents, Records, T Results / Process Orders ?est ?'[]'$  - 0 ?Staff telephones HHA, Nursing Homes / Clarify orders / etc ?'[]'$  - 0 ?Routine Transfer to another Facility (non-emergent condition) ?'[]'$  - 0 ?Routine Hospital Admission (non-emergent condition) ?X- 1 15 ?New Admissions / Biomedical engineer / Ordering NPWT Apligraf, etc. ?, ?'[]'$  - 0 ?Emergency Hospital Admission (emergent condition) ?PROCESS - Special Needs ?'[]'$  - 0 ?Pediatric / Minor Patient Management ?'[]'$  - 0 ?Isolation Patient Management ?'[]'$  - 0 ?Hearing / Language / Visual special needs ?'[]'$  - 0 ?Assessment of Community assistance (transportation, D/C planning, etc.) ?'[]'$  - 0 ?Additional assistance / Altered mentation ?'[]'$  - 0 ?Support Surface(s) Assessment (bed, cushion, seat, etc.) ?INTERVENTIONS - Miscellaneous ?'[]'$  - 0 ?External ear exam ?'[]'$  - 0 ?Patient Transfer (multiple staff / Civil Service fast streamer / Similar devices) ?'[]'$  - 0 ?Simple Staple / Suture removal (25 or less) ?'[]'$  - 0 ?Complex  Staple / Suture removal (26 or more) ?'[]'$  - 0 ?Hypo/Hyperglycemic Management (do not check if billed separately) ?'[]'$  - 0 ?Ankle / Brachial Index (ABI) - do not check if billed separately ?Has the patient been seen at the hospital within the last  three years: Yes ?Total Score: 95 ?Level Of Care: New/Established - Level 3 ?Electronic Signature(s) ?Signed: 08/06/2021 5:13:01 PM By: Deon Pilling RN, BSN ?Entered By: Deon Pilling on 08/06/2021 10:04:17 ?-------------------------------------------------------------------------------- ?Encounter Discharge Information Details ?Patient Name: ?Date of Service: ?[name], [name] 08/06/2021 9:00 A M ?Medical Record Number: [id number] ?Patient Account Number: [id number] ?Date of Birth/Sex: ?Treating RN: ?Dec 29, 1945 (76 y.o. F) [name], [name] ?Primary Care Maalle Starrett: [name] ?Other Clinician: ?Referring Brad Mcgaughy: ?Treating Aundra Espin/Extender: Kalman Shan ?Orpah Melter C ?Weeks in Treatment: 0 ?Encounter Discharge Information Items Post Procedure Vitals ?Discharge Condition: Stable ?Temperature (F): 97.8 ?Ambulatory Status: [name] ?Pulse (bpm): 94 ?Discharge Destination: Home ?Respiratory Rate (breaths/min): 20 ?Transportation: Private Auto ?Blood Pressure (mmHg): 174/78 ?Accompanied By: self ?Schedule Follow-up Appointment: Yes ?Clinical Summary of Care: ?Electronic Signature(s) ?Signed: 08/06/2021 5:13:01 PM By: Deon Pilling RN, BSN ?Entered By: Deon Pilling on 08/06/2021 10:14:00 ?-------------------------------------------------------------------------------- ?Lower Extremity Assessment Details ?Patient Name: ?Date of Service: ?[name], [name] 08/06/2021 9:00 A M ?Medical Record Number: [id number] ?Patient Account Number: [id number] ?Date of Birth/Sex: ?Treating RN: ?1945/12/02 (76 y.o. F) [name], [name] ?Primary Care Ova Gillentine: [name] ?Other Clinician: ?Referring Carlo Lorson: ?Treating Tor Tsuda/Extender: Kalman Shan ?Orpah Melter C ?Weeks in Treatment:  0 ?Edema Assessment ?Assessed: [Left: Yes] [Right: No] ?Edema: [Left: Ye] [Right: s] ?Calf ?Left: Right: ?Point of Measurement: 30 cm From Medial Instep 45.5 cm ?Ankle ?Left: Right: ?Point of Measurement: 9 cm From Medial Instep 28.5 cm ?Knee To Floor ?Left: Right: ?From Medial Instep 42 cm ?Vascular Assessment ?Pulses: ?Dorsalis Pedis ?Palpable: [Left:Yes] ?Doppler Audible: [Left:Yes] ?Posterior Tibial ?Palpable: [Left:Yes] ?Doppler Audible: [Left:Yes] ?Notes ?unable to obtain r/t pain when applying BP cuff. ?Electronic Signature(s) ?Signed: 08/06/2021 5:13:01 PM By: Deon Pilling RN, BSN ?Entered By: Deon Pilling on 08/06/2021 09:35:08 ?-------------------------------------------------------------------------------- ?Multi Wound Chart Details ?Patient Name: Date of Service: ?[name], [name] 08/06/2021 9:00 A M ?Medical Record Number: [id number] ?Patient Account Number: [id number] ?Date of Birth/Sex: Treating RN: ?1945-07-18 (76 y.o. F) [name], [name] ?Primary Care Melinda Gwinner: [name] Other Clinician: ?Referring Kaysen Deal: ?Treating Kang Ishida/Extender: Kalman Shan ?Orpah Melter C ?Weeks in Treatment: 0 ?Vital Signs ?Height(in): 63 ?Pulse(bpm): 94 ?Weight(lbs): 220 ?Blood Pressure(mmHg): 174/78 ?Body Mass Index(BMI): 39 ?Temperature(??F): 97.8 ?Respiratory Rate(breaths/min): 20 ?Photos: [N/A:N/A] ?Left, Anterior Lower Leg N/A N/A ?Wound Location: ?Trauma N/A N/A ?Wounding Event: ?Abrasion N/A N/A ?Primary Etiology: ?Lymphedema N/A N/A ?Secondary Etiology: ?Hypertension N/A N/A ?Comorbid History: ?05/06/2021 N/A N/A ?Date Acquired: ?0 N/A N/A ?Weeks of Treatment: ?Open N/A N/A ?Wound Status: ?No N/A N/A ?Wound Recurrence: ?2.8x1.5x0.1 N/A N/A ?Measurements L x W x D (cm) ?3.299 N/A N/A ?A (cm?) : ?rea ?0.33 N/A N/A ?Volume (cm?) : ?0.00% N/A N/A ?% Reduction in A rea: ?0.00% N/A N/A ?% Reduction in Volume: ?Full Thickness Without Exposed N/A N/A ?Classification: ?Support Structures ?Medium N/A N/A ?Exudate  A mount: ?Serosanguineous N/A N/A ?Exudate Type: ?red, brown N/A N/A ?Exudate Color: ?Distinct, outline attached N/A N/A ?Wound Margin: ?Small (1-33%) N/A N/A ?Granulation A mount: ?Red, Pink N/A N/A ?Granulation Quality:

## 2021-08-06 NOTE — Progress Notes (Signed)
VEOLA, CAFARO ([id number]) ?Visit Report for 08/06/2021 ?Abuse Risk Screen Details ?Patient Name: Date of Service: ?[name], [name] 08/06/2021 9:00 A M ?Medical Record Number: [id number] ?Patient Account Number: [id number] ?Date of Birth/Sex: Treating RN: ?[birth year] (76 y.o. F) [name], [name] ?Primary Care Navdeep Fessenden: [name] Other Clinician: ?Referring Gaspard Isbell: ?Treating Anastasiya Gowin/Extender: Kalman Shan ?Orpah Melter C ?Weeks in Treatment: 0 ?Abuse Risk Screen Items ?Answer ?ABUSE RISK SCREEN: ?Has anyone close to you tried to hurt or harm you recentlyo No ?Do you feel uncomfortable with anyone in your familyo No ?Has anyone forced you do things that you didnt want to doo No ?Electronic Signature(s) ?Signed: 08/06/2021 5:13:01 PM By: Deon Pilling RN, BSN ?Entered By: Deon Pilling on 08/06/2021 09:18:12 ?-------------------------------------------------------------------------------- ?Activities of Daily Living Details ?Patient Name: Date of Service: ?[name], [name] 08/06/2021 9:00 A M ?Medical Record Number: [id number] ?Patient Account Number: [id number] ?Date of Birth/Sex: Treating RN: ?29-Aug-1945 (76 y.o. F) [name], [name] ?Primary Care Jobany Montellano: [name] Other Clinician: ?Referring Katerina Zurn: ?Treating Jerred Zaremba/Extender: Kalman Shan ?Orpah Melter C ?Weeks in Treatment: 0 ?Activities of Daily Living Items ?Answer ?Activities of Daily Living (Please select one for each item) ?Borger ?T Medications ?ake Completely Able ?Use T elephone Completely Able ?Care for Appearance Completely Able ?Use T oilet Completely Able ?Bath / Shower Completely Able ?Dress Self Completely Able ?Feed Self Completely Able ?Walk Completely Able ?Get In / Out Bed Completely Able ?Housework Completely Able ?Prepare Meals Completely Able ?Handle Money Completely Able ?Shop for Self Completely Able ?Electronic Signature(s) ?Signed: 08/06/2021 5:13:01 PM By: Deon Pilling RN, BSN ?Entered  By: Deon Pilling on 08/06/2021 09:18:28 ?-------------------------------------------------------------------------------- ?Education Screening Details ?Patient Name: ?Date of Service: ?[name], [name] 08/06/2021 9:00 A M ?Medical Record Number: [id number] ?Patient Account Number: [id number] ?Date of Birth/Sex: ?Treating RN: ?[birth year] (76 y.o. F) [name], [name] ?Primary Care Maricela Schreur: [name] ?Other Clinician: ?Referring Ubaldo Daywalt: ?Treating Wilmar Prabhakar/Extender: Kalman Shan ?Orpah Melter C ?Weeks in Treatment: 0 ?Primary Learner Assessed: Patient ?Learning Preferences/Education Level/Primary Language ?Learning Preference: Explanation, Demonstration, Printed Material ?Highest Education Level: College or Above ?Preferred Language: English ?Cognitive Barrier ?Language Barrier: No ?Translator Needed: No ?Memory Deficit: No ?Emotional Barrier: No ?Cultural/Religious Beliefs Affecting Medical Care: No ?Physical Barrier ?Impaired Vision: Yes Glasses ?Knowledge/Comprehension ?Knowledge Level: High ?Comprehension Level: High ?Ability to understand written instructions: High ?Ability to understand verbal instructions: High ?Motivation ?Anxiety Level: Calm ?Cooperation: Cooperative ?Education Importance: Acknowledges Need ?Interest in Health Problems: Asks Questions ?Perception: Coherent ?Willingness to Engage in Self-Management High ?Activities: ?Readiness to Engage in Self-Management High ?Activities: ?Electronic Signature(s) ?Signed: 08/06/2021 5:13:01 PM By: Deon Pilling RN, BSN ?Entered By: Deon Pilling on 08/06/2021 09:18:44 ?-------------------------------------------------------------------------------- ?Fall Risk Assessment Details ?Patient Name: ?Date of Service: ?[name], [name] 08/06/2021 9:00 A M ?Medical Record Number: [id number] ?Patient Account Number: [id number] ?Date of Birth/Sex: ?Treating RN: ?07-06-1945 (76 y.o. F) [name], [name] ?Primary Care Nilam Quakenbush: [name] ?Other  Clinician: ?Referring Elvan Ebron: ?Treating Toyoko Silos/Extender: Kalman Shan ?Orpah Melter C ?Weeks in Treatment: 0 ?Fall Risk Assessment Items ?Have you had 2 or more falls in the last 12 monthso 0 No ?Have you had any fall that resulted in injury in the last 12 monthso 0 No ?FALLS RISK SCREEN ?History of falling - immediate or within 3 months 0 No ?Secondary diagnosis (Do you have 2 or more medical diagnoseso) 0 No ?Ambulatory aid ?None/bed rest/wheelchair/nurse 0 No ?Crutches/cane/walker 15 Yes ?Furniture 0 No ?Intravenous therapy Access/Saline/Heparin Lock 0 No ?  Gait/Transferring ?Normal/ bed rest/ wheelchair 0 Yes ?Weak (short steps with or without shuffle, stooped but able to lift head while walking, may seek 0 No ?support from furniture) ?Impaired (short steps with shuffle, may have difficulty arising from chair, head down, impaired 0 No ?balance) ?Mental Status ?Oriented to own ability 0 Yes ?Electronic Signature(s) ?Signed: 08/06/2021 5:13:01 PM By: Deon Pilling RN, BSN ?Entered By: Deon Pilling on 08/06/2021 09:18:55 ?-------------------------------------------------------------------------------- ?Foot Assessment Details ?Patient Name: ?Date of Service: ?[name], [name] 08/06/2021 9:00 A M ?Medical Record Number: [id number] ?Patient Account Number: [id number] ?Date of Birth/Sex: ?Treating RN: ?[birth year] (76 y.o. F) [name], [name] ?Primary Care Corneluis Allston: [name] ?Other Clinician: ?Referring Eleen Litz: ?Treating Zakari Bathe/Extender: Kalman Shan ?Orpah Melter C ?Weeks in Treatment: 0 ?Foot Assessment Items ?Site Locations ?+ = Sensation present, - = Sensation absent, C = Callus, U = Ulcer ?R = Redness, W = Warmth, M = Maceration, PU = Pre-ulcerative lesion ?F = Fissure, S = Swelling, D = Dryness ?Assessment ?Right: Left: ?Other Deformity: No No ?Prior Foot Ulcer: No No ?Prior Amputation: No No ?Charcot Joint: No No ?Ambulatory Status: Ambulatory With Help ?Assistance Device: Cane ?Gait:  Steady ?Electronic Signature(s) ?Signed: 08/06/2021 5:13:01 PM By: Deon Pilling RN, BSN ?Entered By: Deon Pilling on 08/06/2021 09:31:16 ?-------------------------------------------------------------------------------- ?Nutrition Risk Screening Details ?Patient Name: ?Date of Service: ?[name], [name] 08/06/2021 9:00 A M ?Medical Record Number: [id number] ?Patient Account Number: [id number] ?Date of Birth/Sex: ?Treating RN: ?[birth year] (76 y.o. F) [name], [name] ?Primary Care Oluwadamilola Deliz: [name] ?Other Clinician: ?Referring Kaidence Callaway: ?Treating Donnabelle Blanchard/Extender: Kalman Shan ?Orpah Melter C ?Weeks in Treatment: 0 ?Height (in): 63 ?Weight (lbs): 220 ?Body Mass Index (BMI): 39 ?Nutrition Risk Screening Items ?Score Screening ?NUTRITION RISK SCREEN: ?I have an illness or condition that made me change the kind and/or amount of food I eat 2 Yes ?I eat fewer than two meals per day 0 No ?I eat few fruits and vegetables, or milk products 0 No ?I have three or more drinks of beer, liquor or wine almost every day 0 No ?I have tooth or mouth problems that make it hard for me to eat 0 No ?I don't always have enough money to buy the food I need 0 No ?I eat alone most of the time 0 No ?I take three or more different prescribed or over-the-counter drugs a day 1 Yes ?Without wanting to, I have lost or gained 10 pounds in the last six months 0 No ?I am not always physically able to shop, cook and/or feed myself 0 No ?Nutrition Protocols ?Good Risk Protocol ?Moderate Risk Protocol 0 Provide education on nutrition ?High Risk Proctocol ?Risk Level: Moderate Risk ?Score: 3 ?Electronic Signature(s) ?Signed: 08/06/2021 5:13:01 PM By: Deon Pilling RN, BSN ?Entered By: Deon Pilling on 08/06/2021 09:19:07 ?

## 2021-08-06 NOTE — Progress Notes (Signed)
DREMA, EDDINGTON ([id number]) ?Visit Report for 08/06/2021 ?Chief Complaint Document Details ?Patient Name: Date of Service: ?[name], Jessica [name] 08/06/2021 9:00 A M ?Medical Record Number: [id number] ?Patient Account Number: [id number] ?Date of Birth/Sex: Treating [name]: ?[birth year] (77 y.o. F) [name] ?Primary Care Provider: [name] Other Clinician: ?Referring Provider: ?Treating Provider/Extender: [name] ?[name] ?Weeks in [name]: 0 ?Information Obtained from: Patient ?Chief Complaint ?08/06/2021; left lower extremity wound following trauma ?Electronic Signature(s) ?Signed: 08/06/2021 12:48:38 PM By: Jessica Shan DO ?Entered By: [name] on 08/06/2021 10:31:18 ?-------------------------------------------------------------------------------- ?Debridement Details ?Patient Name: Date of Service: ?[name], Jessica [name] 08/06/2021 9:00 A M ?Medical Record Number: [id number] ?Patient Account Number: [id number] ?Date of Birth/Sex: Treating [name]: ?11-07-1945 (76 y.o. F) [name] ?Primary Care Provider: [name] Other Clinician: ?Referring Provider: ?Treating Provider/Extender: [name] ?[name] ?Weeks in [name]: 0 ?Debridement Performed for Assessment: Wound #1 Left,Anterior Lower Leg ?Performed By: Physician Jessica Shan, DO ?Debridement Type: Debridement ?Level of Consciousness (Pre-procedure): Awake and Alert ?Pre-procedure Verification/Time Out Yes - 10:00 ?Taken: ?Start Time: 10:01 ?Pain Control: Lidocaine 5% topical ointment ?T Area Debrided (L x W): ?otal 2.8 (cm) x 1.5 (cm) = 4.2 (cm?) ?Tissue and other material debrided: ?Viable, Non-Viable, Slough, Subcutaneous, Skin: Dermis , Skin: Epidermis, Fibrin/Exudate, Slough ?Level: Skin/Subcutaneous Tissue ?Debridement Description: Excisional ?Instrument: Curette ?Bleeding: Minimum ?Hemostasis Achieved: Pressure ?End Time: 10:04 ?Procedural Pain: 0 ?Post Procedural Pain: 0 ?Response to [name]:  Procedure was tolerated well ?Level of Consciousness (Post- Awake and Alert ?procedure): ?Post Debridement Measurements of Total Wound ?Length: (cm) 2.8 ?Width: (cm) 1.5 ?Depth: (cm) 0.1 ?Volume: (cm?) 0.33 ?Character of Wound/Ulcer Post Debridement: Improved ?Post Procedure Diagnosis ?Same as Pre-procedure ?Electronic Signature(s) ?Signed: 08/06/2021 12:48:38 PM By: Jessica Shan DO ?Signed: 08/06/2021 5:13:01 PM By: [name], [name] ?Entered By: [name] on 08/06/2021 10:05:41 ?-------------------------------------------------------------------------------- ?HPI Details ?Patient Name: Date of Service: ?[name], Jessica [name] 08/06/2021 9:00 A M ?Medical Record Number: [id number] ?Patient Account Number: [id number] ?Date of Birth/Sex: Treating [name]: ?1945-07-28 (76 y.o. F) [name] ?Primary Care Provider: [name] Other Clinician: ?Referring Provider: ?Treating Provider/Extender: [name] ?[name] ?Weeks in [name]: 0 ?History of Present Illness ?HPI Description: Admission/20 08/2021 ?[name] is a 76 year old female with a past medical history of breast cancer status post bilateral mastectomy that presents the clinic for a 45-month?history of nonhealing ulcer to the left lower extremity. On 05/06/2021 while at work she hit her leg against the corner of a desk experiencing a decent laceration. ?She went to the ED and had sutures placed. These have since been removed. She has been on 2 rounds of antibiotics including Keflex and Bactrim for this ?issue. She reports chronic pain to the wound site. She has been using Xeroform with dressing changes. She denies purulent drainage. ?Electronic Signature(s) ?Signed: 08/06/2021 12:48:38 PM By: Jessica ShanDO ?Entered By: [name] 08/06/2021 10:33:00 ?-------------------------------------------------------------------------------- ?Physical Exam Details ?Patient Name: Date of Service: ?[name], Jessica [name] 08/06/2021 9:00 A  M ?Medical Record Number: [id number]?Patient Account Number: [id number]?Date of Birth/Sex: Treating [name]: ?[birth year]((76y.o. F) [name] ?Primary Care Provider: MHowie IllOther Clinician: ?Referring Provider: ?Treating Provider/Extender: [name]?[name] ?Weeks in [name]: 0 ?Constitutional ?respirations regular, non-labored and within target range for patient..Marland Kitchen?Cardiovascular ?2+ dorsalis pedis/posterior tibialis pulses. ?Psychiatric ?pleasant and cooperative. ?Notes ?Left lower extremity: T the anterior aspect there is an open wound with nonviable surface  throughout. T ?o enderness on exam. No signs of soft tissue infection. ?Electronic Signature(s) ?Signed: 08/06/2021 12:48:38 PM By: Jessica Shan DO ?Entered By: [name] on 08/06/2021 10:34:13 ?-------------------------------------------------------------------------------- ?Physician Orders Details ?Patient Name: ?Date of Service: ?[name], Jessica [name] 08/06/2021 9:00 A M ?Medical Record Number: [id number] ?Patient Account Number: [id number] ?Date of Birth/Sex: ?Treating [name]: ?[birth year] (76 y.o. F) [name] ?Primary Care Provider: [name] ?Other Clinician: ?Referring Provider: ?Treating Provider/Extender: [name] ?[name] ?Weeks in [name]: 0 ?Verbal / Phone Orders: No ?Diagnosis Coding ?ICD-10 Coding ?Code Description ?N36.144 Non-pressure chronic ulcer of other part of right lower leg with fat layer exposed ?I10 Essential (primary) hypertension ?I89.0 Lymphedema, not elsewhere classified ?I87.2 Venous insufficiency (chronic) (peripheral) ?C50.919 Malignant neoplasm of unspecified site of unspecified female breast ?Follow-up Appointments ?ppointment in 1 week. - Dr. Heber [place] and [place], Room 8 Monday 08/12/2021 130pm ?Return A ?Other: - [employer]- will send out wound care supplies ?***Pick up the gentamicin ointment at your pharmacy.*** ?***will call you with results of the PCR  culture if positive will send in order for topical compounding antibiotic from [facility].*** ?Bathing/ Shower/ Hygiene ?May shower and wash wound with soap and water. ?Edema Control - Lymphedema / SCD / Other ?Elevate legs to the level of the heart or above for 30 minutes daily and/or when sitting, a frequency of: - 3-4 times a day throughout the day. ?Avoid standing for long periods of time. ?Exercise regularly ?Moisturize legs daily. - both legs every night before bed. ?Wound [name] ?Wound #1 - Lower Leg Wound Laterality: Left, Anterior ?Cleanser: Soap and Water 1 x Per Day/30 Days ?Discharge Instructions: May shower and wash wound with dial antibacterial soap and water prior to dressing change. ?Peri-Wound Care: Skin Prep (DME) (Generic) 1 x Per Day/30 Days ?Discharge Instructions: Use skin prep as directed ?Topical: Gentamicin 1 x Per Day/30 Days ?Discharge Instructions: Apply directly to wound bed. ?Prim Dressing: Hydrofera Blue Ready Foam, 4x5 in 1 x Per Day/30 Days ?ary ?Discharge Instructions: Apply to wound bed as instructed ?Secondary Dressing: Woven Gauze Sponges 2x2 in (DME) (Generic) 1 x Per Day/30 Days ?Discharge Instructions: Apply over primary dressing as directed. ?Secondary Dressing: Zetuvit Plus Silicone Border Dressing 5x5 (in/in) (DME) (Generic) 1 x Per Day/30 Days ?Discharge Instructions: Apply silicone border over primary dressing as directed. ?Laboratory ?erobe culture (MICRO) ?Bacteria identified in Unspecified specimen by A ?LOINC Code: 315-4 ?Convenience Name: Aerobic culture-specimen not specified ?Patient Medications ?llergies: penicillin ?A ?Notifications Medication Indication Start End ?08/06/2021 ?gentamicin ?DOSE 1 - topical 0.1 % cream - apply to the wound bed daily ?Electronic Signature(s) ?Signed: 08/06/2021 12:48:38 PM By: Jessica Shan DO ?Signed: 08/06/2021 5:13:01 PM By: [name], [name] ?Previous Signature: 08/06/2021 10:38:11 AM Version By: Jessica Shan  DO ?Entered By: [name] on 08/06/2021 10:53:16 ?Prescription 08/06/2021 ?-------------------------------------------------------------------------------- ?Nau, Byron J. Jessica Shan DO ?Patient Name: [name]

## 2021-08-12 ENCOUNTER — Encounter (HOSPITAL_BASED_OUTPATIENT_CLINIC_OR_DEPARTMENT_OTHER): Payer: No Typology Code available for payment source | Attending: Internal Medicine | Admitting: Internal Medicine

## 2021-08-12 DIAGNOSIS — L97822 Non-pressure chronic ulcer of other part of left lower leg with fat layer exposed: Secondary | ICD-10-CM | POA: Insufficient documentation

## 2021-08-12 DIAGNOSIS — I872 Venous insufficiency (chronic) (peripheral): Secondary | ICD-10-CM | POA: Diagnosis not present

## 2021-08-12 DIAGNOSIS — W228XXA Striking against or struck by other objects, initial encounter: Secondary | ICD-10-CM | POA: Insufficient documentation

## 2021-08-12 DIAGNOSIS — Z853 Personal history of malignant neoplasm of breast: Secondary | ICD-10-CM | POA: Insufficient documentation

## 2021-08-12 DIAGNOSIS — L97812 Non-pressure chronic ulcer of other part of right lower leg with fat layer exposed: Secondary | ICD-10-CM | POA: Diagnosis not present

## 2021-08-12 DIAGNOSIS — S81812A Laceration without foreign body, left lower leg, initial encounter: Secondary | ICD-10-CM | POA: Insufficient documentation

## 2021-08-12 DIAGNOSIS — I890 Lymphedema, not elsewhere classified: Secondary | ICD-10-CM | POA: Insufficient documentation

## 2021-08-12 DIAGNOSIS — I10 Essential (primary) hypertension: Secondary | ICD-10-CM | POA: Diagnosis not present

## 2021-08-12 DIAGNOSIS — G8929 Other chronic pain: Secondary | ICD-10-CM | POA: Diagnosis not present

## 2021-08-12 DIAGNOSIS — Z9013 Acquired absence of bilateral breasts and nipples: Secondary | ICD-10-CM | POA: Diagnosis not present

## 2021-08-12 NOTE — Progress Notes (Signed)
Jessica, [name] ([id number]) ?Visit Report for 08/12/2021 ?Chief Complaint Document Details ?Patient Name: Date of Service: ?[name], Jessica [name] 08/12/2021 1:30 PM ?Medical Record Number: [id number] ?Patient Account Number: [id number] ?Date of Birth/Sex: Treating RN: ?1945-05-04 (76 y.o. F) [name], Jessica ?Primary Care Provider: [name] Other Clinician: ?Referring Provider: ?Treating Provider/Extender: Kalman Shan ?Jessica Melter C ?Weeks in [name]: 0 ?Information Obtained from: Patient ?Chief Complaint ?08/06/2021; left lower extremity wound following trauma ?Electronic Signature(s) ?Signed: 08/12/2021 3:16:52 PM By: Kalman Shan DO ?Entered By: Kalman Shan on 08/12/2021 14:51:47 ?-------------------------------------------------------------------------------- ?Debridement Details ?Patient Name: Date of Service: ?[name], Jessica [place] [name] 08/12/2021 1:30 PM ?Medical Record Number: [id number] ?Patient Account Number: [id number] ?Date of Birth/Sex: Treating RN: ?February 17, 1946 (76 y.o. F) [name], Jessica ?Primary Care Provider: [name] Other Clinician: ?Referring Provider: ?Treating Provider/Extender: Kalman Shan ?Jessica Melter C ?Weeks in [name]: 0 ?Debridement Performed for Assessment: Wound #1 Left,Anterior Lower Leg ?Performed By: Physician Kalman Shan, DO ?Debridement Type: Debridement ?Level of Consciousness (Pre-procedure): Awake and Alert ?Pre-procedure Verification/Time Out Yes - 14:05 ?Taken: ?Start Time: 14:06 ?Pain Control: Lidocaine 5% topical ointment ?T Area Debrided (L x W): ?otal 2.4 (cm) x 2.5 (cm) = 6 (cm?) ?Tissue and other material debrided: ?Viable, Non-Viable, Slough, Subcutaneous, Skin: Dermis , Skin: Epidermis, Fibrin/Exudate, Slough ?Level: Skin/Subcutaneous Tissue ?Debridement Description: Excisional ?Instrument: Curette ?Bleeding: Minimum ?Hemostasis Achieved: Pressure ?End Time: 14:10 ?Procedural Pain: 0 ?Post Procedural Pain: 0 ?Response to [name]: Procedure was  tolerated well ?Level of Consciousness (Post- Awake and Alert ?procedure): ?Post Debridement Measurements of Total Wound ?Length: (cm) 2.4 ?Width: (cm) 2.5 ?Depth: (cm) 0.1 ?Volume: (cm?) 0.471 ?Character of Wound/Ulcer Post Debridement: Improved ?Post Procedure Diagnosis ?Same as Pre-procedure ?Electronic Signature(s) ?Signed: 08/12/2021 3:16:52 PM By: Kalman Shan DO ?Signed: 08/12/2021 6:01:58 PM By: Jessica Pilling RN, BSN ?Entered By: [name] on 08/12/2021 14:11:10 ?-------------------------------------------------------------------------------- ?HPI Details ?Patient Name: Date of Service: ?[name],  [name] 08/12/2021 1:30 PM ?Medical Record Number: [id number] ?Patient Account Number: [id number] ?Date of Birth/Sex: Treating RN: ?24-Jul-1945 (76 y.o. F) [name], Jessica ?Primary Care Provider: [name] Other Clinician: ?Referring Provider: ?Treating Provider/Extender: Kalman Shan ?Jessica Melter C ?Weeks in [name]: 0 ?History of Present Illness ?HPI Description: Admission/20 08/2021 ?[name] is a 76 year old female with a past medical history of breast cancer status post bilateral mastectomy that presents the clinic for a 51-month?history of nonhealing ulcer to the left lower extremity. On 05/06/2021 while at work she hit her leg against the corner of a desk experiencing a decent laceration. ?She went to the ED and had sutures placed. These have since been removed. She has been on 2 rounds of antibiotics including Keflex and Bactrim for this ?issue. She reports chronic pain to the wound site. She has been using Xeroform with dressing changes. She denies purulent drainage. ?5/1; patient presents for follow-up. She started using gentamicin ointment and Hydrofera Blue over the past week. She reports improvement in her chronic ?pain. She has compression stockings but it is unclear if she is wearing these daily. She currently denies systemic signs of infection. She had a PCR culture ?done at last  clinic visit that showed high levels of Staph aureus. Keystone antibiotics has been ordered and she states she is receiving this soon. ?Electronic Signature(s) ?Signed: 08/12/2021 3:16:52 PM By: HKalman ShanDO ?Entered By: HKalman Shanon 08/12/2021 14:54:41 ?-------------------------------------------------------------------------------- ?Physical Exam Details ?Patient Name: Date of Service: ?[name] 08/12/2021 1:30 PM ?Medical Record Number: [id number]?Patient  Account Number: [id number] ?Date of Birth/Sex: Treating RN: ?1946/03/06 (76 y.o. F) [name], Jessica ?Primary Care Provider: [name] Other Clinician: ?Referring Provider: ?Treating Provider/Extender: Kalman Shan ?Jessica Melter C ?Weeks in [name]: 0 ?Constitutional ?respirations regular, non-labored and within target range for patient.Marland Kitchen ?Cardiovascular ?2+ dorsalis pedis/posterior tibialis pulses. ?Psychiatric ?pleasant and cooperative. ?Notes ?Left lower extremity: T the anterior aspect there is an open wound with granulation tissue and tightly adhered nonviable tissue. Minimal tenderness on exam. ?o ?No signs of surrounding soft tissue infection. 3+ pitting edema to the knee ?Electronic Signature(s) ?Signed: 08/12/2021 3:16:52 PM By: Kalman Shan DO ?Signed: 08/12/2021 3:16:52 PM By: Kalman Shan DO ?Entered By: Kalman Shan on 08/12/2021 14:55:59 ?-------------------------------------------------------------------------------- ?Physician Orders Details ?Patient Name: ?Date of Service: ?[name], [name] 08/12/2021 1:30 PM ?Medical Record Number: [id number] ?Patient Account Number: [id number] ?Date of Birth/Sex: ?Treating RN: ?Jan 07, 1946 (76 y.o. F) [name], Jessica ?Primary Care Provider: [name] ?Other Clinician: ?Referring Provider: ?Treating Provider/Extender: Kalman Shan ?Jessica Melter C ?Weeks in [name]: 0 ?Verbal / Phone Orders: No ?Diagnosis Coding ?ICD-10 Coding ?Code Description ?E17.408  Non-pressure chronic ulcer of other part of right lower leg with fat layer exposed ?T79.9XXA Unspecified early complication of trauma, initial encounter ?I10 Essential (primary) hypertension ?I89.0 Lymphedema, not elsewhere classified ?I87.2 Venous insufficiency (chronic) (peripheral) ?C50.919 Malignant neoplasm of unspecified site of unspecified female breast ?Follow-up Appointments ?ppointment in 1 week. - Dr. Heber  and [place], Room 8 Tuesday 08/20/2021 0900am ?Return A ?Other: - [employer]- will send out wound care supplies ?[place] to call you about topical compounding antibiotic. BRING IN AT NEXT APPT TIME.**** ?Bathing/ Shower/ Hygiene ?May shower and wash wound with soap and water. ?Edema Control - Lymphedema / SCD / Other ?Elevate legs to the level of the heart or above for 30 minutes daily and/or when sitting, a frequency of: - 3-4 times a day throughout the day. ?Avoid standing for long periods of time. ?Exercise regularly ?Moisturize legs daily. - both legs every night before bed. ?Wound [name] ?Wound #1 - Lower Leg Wound Laterality: Left, Anterior ?Cleanser: Soap and Water 1 x Per Week/30 Days ?Discharge Instructions: May shower and wash wound with dial antibacterial soap and water prior to dressing change. ?Peri-Wound Care: Sween Lotion (Moisturizing lotion) 1 x Per Week/30 Days ?Discharge Instructions: Apply moisturizing lotion as directed ?Topical: Mupirocin Ointment 1 x Per Week/30 Days ?Discharge Instructions: Apply Mupirocin (Bactroban) as instructed ?Topical: keystone topical compounding antibiotics 1 x Per Week/30 Days ?Discharge Instructions: will apply mupirocin in clinic and will use compounding antibiotics once it arrives at next appt time. ?Prim Dressing: Hydrofera Blue Ready Foam, 4x5 in 1 x Per Week/30 Days ?ary ?Discharge Instructions: Apply to wound bed as instructed ?Secondary Dressing: Zetuvit Plus Silicone Border Dressing 5x5 (in/in) (Generic) 1 x Per Week/30  Days ?Discharge Instructions: Apply silicone border over primary dressing as directed. ?Compression Wrap: Kerlix Roll 4.5x3.1 (in/yd) 1 x Per Week/30 Days ?Discharge Instructions: Apply Kerlix and Coban compression as

## 2021-08-13 NOTE — Progress Notes (Signed)
MYRON, LONA ([id number]) ?Visit Report for 08/12/2021 ?Arrival Information Details ?Patient Name: Date of Service: ?[name], [place] [name] 08/12/2021 1:30 PM ?Medical Record Number: [id number] ?Patient Account Number: [id number] ?Date of Birth/Sex: Treating RN: ?1946-02-03 (76 y.o. F) [name], [name] ?Primary Care Alphia Behanna: [name] Other Clinician: ?Referring Orian Amberg: ?Treating Kemaya Dorner/Extender: Kalman Shan ?Orpah Melter C ?Weeks in Treatment: 0 ?Visit Information History Since Last Visit ?Added or deleted any medications: No ?Patient Arrived: Ambulatory ?Any new allergies or adverse reactions: No ?Arrival Time: 13:27 ?Had a fall or experienced change in No ?Accompanied By: self ?activities of daily living that may affect ?Transfer Assistance: None ?risk of falls: ?Patient Identification Verified: Yes ?Signs or symptoms of abuse/neglect since last visito No ?Secondary Verification Process Completed: Yes ?Hospitalized since last visit: No ?Patient Requires Transmission-Based Precautions: No ?Implantable device outside of the clinic excluding No ?Patient Has Alerts: No ?cellular tissue based products placed in the center ?since last visit: ?Has Dressing in Place as Prescribed: Yes ?Pain Present Now: Yes ?Electronic Signature(s) ?Signed: 08/13/2021 11:45:56 AM By: Sandre Kitty ?Entered By: Sandre Kitty on 08/12/2021 13:28:31 ?-------------------------------------------------------------------------------- ?Encounter Discharge Information Details ?Patient Name: Date of Service: ?[name], [place] [name] 08/12/2021 1:30 PM ?Medical Record Number: [id number] ?Patient Account Number: [id number] ?Date of Birth/Sex: Treating RN: ?1945/11/17 (76 y.o. F) [name], [name] ?Primary Care Caitlin Hillmer: [name] Other Clinician: ?Referring Diesha Rostad: ?Treating Mishayla Sliwinski/Extender: Kalman Shan ?Orpah Melter C ?Weeks in Treatment: 0 ?Encounter Discharge Information Items Post Procedure Vitals ?Discharge Condition:  Stable ?Temperature (F): 98.3 ?Ambulatory Status: Ambulatory ?Pulse (bpm): 82 ?Discharge Destination: Home ?Respiratory Rate (breaths/min): 20 ?Transportation: Private Auto ?Blood Pressure (mmHg): 165/75 ?Accompanied By: self ?Schedule Follow-up Appointment: Yes ?Clinical Summary of Care: ?Electronic Signature(s) ?Signed: 08/12/2021 6:01:58 PM By: Deon Pilling RN, BSN ?Entered By: Deon Pilling on 08/12/2021 14:16:30 ?-------------------------------------------------------------------------------- ?Lower Extremity Assessment Details ?Patient Name: ?Date of Service: ?[name], [name] 08/12/2021 1:30 PM ?Medical Record Number: [id number] ?Patient Account Number: [id number] ?Date of Birth/Sex: ?Treating RN: ?04/27/1945 (76 y.o. F) [name], [name] ?Primary Care Neziah Vogelgesang: [name] ?Other Clinician: ?Referring Kellianne Ek: ?Treating Katalyna Socarras/Extender: Kalman Shan ?Orpah Melter C ?Weeks in Treatment: 0 ?Edema Assessment ?Assessed: [Left: Yes] [Right: No] ?Edema: [Left: Ye] [Right: s] ?Calf ?Left: Right: ?Point of Measurement: 30 cm From Medial Instep 45 cm ?Ankle ?Left: Right: ?Point of Measurement: 9 cm From Medial Instep 28.5 cm ?Vascular Assessment ?Pulses: ?Dorsalis Pedis ?Palpable: [Left:Yes] ?Electronic Signature(s) ?Signed: 08/12/2021 6:01:58 PM By: Deon Pilling RN, BSN ?Entered By: Deon Pilling on 08/12/2021 13:52:35 ?-------------------------------------------------------------------------------- ?Multi Wound Chart Details ?Patient Name: ?Date of Service: ?[name], [name] 08/12/2021 1:30 PM ?Medical Record Number: [id number] ?Patient Account Number: [id number] ?Date of Birth/Sex: ?Treating RN: ?1945/07/30 (76 y.o. F) [name], [name] ?Primary Care Robbi Scurlock: [name] ?Other Clinician: ?Referring Oluwaseyi Raffel: ?Treating Latrisha Coiro/Extender: Kalman Shan ?Orpah Melter C ?Weeks in Treatment: 0 ?Vital Signs ?Height(in): 63 ?Pulse(bpm): 82 ?Weight(lbs): 220 ?Blood Pressure(mmHg): 165/75 ?Body Mass Index(BMI):  39 ?Temperature(??F): 98.3 ?Respiratory Rate(breaths/min): 20 ?Photos: [1:Left, Anterior Lower Leg] [N/A:N/A N/A] ?Wound Location: [1:Trauma] [N/A:N/A] ?Wounding Event: [1:Abrasion] [N/A:N/A] ?Primary Etiology: [1:Lymphedema] [N/A:N/A] ?Secondary Etiology: [1:Hypertension] [N/A:N/A] ?Comorbid History: [1:05/06/2021] [N/A:N/A] ?Date Acquired: [1:0] [N/A:N/A] ?Weeks of Treatment: [1:Open] [N/A:N/A] ?Wound Status: [1:No] [N/A:N/A] ?Wound Recurrence: [1:2.4x2.5x0.1] [N/A:N/A] ?Measurements L x W x D (cm) [1:4.712] [N/A:N/A] ?A (cm?) : ?rea [1:0.471] [N/A:N/A] ?Volume (cm?) : [1:-42.80%] [N/A:N/A] ?% Reduction in A rea: [1:-42.70%] [N/A:N/A] ?% Reduction in Volume: [1:Full Thickness Without Exposed] [N/A:N/A] ?Classification: [1:Support Structures Medium] [N/A:N/A] ?Exudate A mount: [  1:Serosanguineous] [N/A:N/A] ?Exudate Type: [1:red, brown] [N/A:N/A] ?Exudate Color: [1:Distinct, outline attached] [N/A:N/A] ?Wound Margin: [1:Medium (34-66%)] [N/A:N/A] ?Granulation A mount: [1:Red, Pink] [N/A:N/A] ?Granulation Quality: [1:Medium (34-66%)] [N/A:N/A] ?Necrotic A mount: ?[1:Fat Layer (Subcutaneous Tissue): Yes N/A] ?Exposed Structures: ?[1:Fascia: No Tendon: No Muscle: No Joint: No Bone: No Small (1-33%)] [N/A:N/A] ?Epithelialization: [1:Debridement - Excisional] [N/A:N/A] ?Debridement: ?Pre-procedure Verification/Time Out 14:05 [N/A:N/A] ?Taken: [1:Lidocaine 5% topical ointment] [N/A:N/A] ?Pain Control: [1:Subcutaneous, Slough] [N/A:N/A] ?Tissue Debrided: [1:Skin/Subcutaneous Tissue] [N/A:N/A] ?Level: [1:6] [N/A:N/A] ?Debridement A (sq cm): [1:rea Curette] [N/A:N/A] ?Instrument: [1:Minimum] [N/A:N/A] ?Bleeding: [1:Pressure] [N/A:N/A] ?Hemostasis A chieved: [1:0] [N/A:N/A] ?Procedural Pain: [1:0] [N/A:N/A] ?Post Procedural Pain: [1:Procedure was tolerated well] [N/A:N/A] ?Debridement Treatment Response: [1:2.4x2.5x0.1] [N/A:N/A] ?Post Debridement Measurements L x ?W x D (cm) [1:0.471] [N/A:N/A] ?Post Debridement Volume: (cm?)  [1:Debridement] [N/A:N/A] ?Treatment Notes ?Wound #1 (Lower Leg) Wound Laterality: Left, Anterior ?Cleanser ?Soap and Water ?Discharge Instruction: May shower and wash wound with dial antibacterial soap and water prior to dressing change. ?Peri-Wound Care ?Sween Lotion (Moisturizing lotion) ?Discharge Instruction: Apply moisturizing lotion as directed ?Topical ?Mupirocin Ointment ?Discharge Instruction: Apply Mupirocin (Bactroban) as instructed ?keystone topical compounding antibiotics ?Discharge Instruction: will apply mupirocin in clinic and will use compounding antibiotics once it arrives at next appt time. ?Primary Dressing ?Hydrofera Blue Ready Foam, 4x5 in ?Discharge Instruction: Apply to wound bed as instructed ?Secondary Dressing ?Zetuvit Plus Silicone Border Dressing 5x5 (in/in) ?Discharge Instruction: Apply silicone border over primary dressing as directed. ?Secured With ?Compression Wrap ?Kerlix Roll 4.5x3.1 (in/yd) ?Discharge Instruction: Apply Kerlix and Coban compression as directed. ?Coban Self-Adherent Wrap 4x5 (in/yd) ?Discharge Instruction: Apply over Kerlix as directed. ?Compression Stockings ?Add-Ons ?Electronic Signature(s) ?Signed: 08/12/2021 3:16:52 PM By: Kalman Shan DO ?Signed: 08/12/2021 6:01:58 PM By: Deon Pilling RN, BSN ?Entered By: Kalman Shan on 08/12/2021 14:50:07 ?-------------------------------------------------------------------------------- ?Multi-Disciplinary Care Plan Details ?Patient Name: ?Date of Service: ?[name], [name] 08/12/2021 1:30 PM ?Medical Record Number: [id number] ?Patient Account Number: [id number] ?Date of Birth/Sex: ?Treating RN: ?12-14-1945 (76 y.o. F) [name], [name] ?Primary Care Alithea Lapage: [name] ?Other Clinician: ?Referring Montrail Mehrer: ?Treating Evett Kassa/Extender: Kalman Shan ?Orpah Melter C ?Weeks in Treatment: 0 ?Active Inactive ?Pain, Acute or Chronic ?Nursing Diagnoses: ?Pain, acute or chronic: actual or potential ?Potential alteration  in comfort, pain ?Goals: ?Patient will verbalize adequate pain control and receive pain control interventions during procedures as needed ?Date Initiated: 08/06/2021 ?Target Resolution Date: 08/16/2021 ?Goal S

## 2021-08-19 ENCOUNTER — Encounter (HOSPITAL_BASED_OUTPATIENT_CLINIC_OR_DEPARTMENT_OTHER): Payer: No Typology Code available for payment source | Admitting: Internal Medicine

## 2021-08-20 ENCOUNTER — Encounter (HOSPITAL_BASED_OUTPATIENT_CLINIC_OR_DEPARTMENT_OTHER): Payer: No Typology Code available for payment source | Admitting: Internal Medicine

## 2021-08-20 DIAGNOSIS — G8929 Other chronic pain: Secondary | ICD-10-CM | POA: Diagnosis not present

## 2021-08-20 DIAGNOSIS — I10 Essential (primary) hypertension: Secondary | ICD-10-CM | POA: Diagnosis not present

## 2021-08-20 DIAGNOSIS — I890 Lymphedema, not elsewhere classified: Secondary | ICD-10-CM | POA: Diagnosis not present

## 2021-08-20 DIAGNOSIS — Z853 Personal history of malignant neoplasm of breast: Secondary | ICD-10-CM | POA: Diagnosis not present

## 2021-08-20 DIAGNOSIS — L97822 Non-pressure chronic ulcer of other part of left lower leg with fat layer exposed: Secondary | ICD-10-CM

## 2021-08-20 DIAGNOSIS — Z9013 Acquired absence of bilateral breasts and nipples: Secondary | ICD-10-CM | POA: Diagnosis not present

## 2021-08-20 DIAGNOSIS — I872 Venous insufficiency (chronic) (peripheral): Secondary | ICD-10-CM | POA: Diagnosis not present

## 2021-08-20 DIAGNOSIS — S81812A Laceration without foreign body, left lower leg, initial encounter: Secondary | ICD-10-CM | POA: Diagnosis not present

## 2021-08-20 NOTE — Progress Notes (Signed)
LOVEDA, COLAIZZI ([id number]) ?Visit Report for 08/20/2021 ?Chief Complaint Document Details ?Patient Name: Date of Service: ?[name], [name] 08/20/2021 9:00 A M ?Medical Record Number: [id number] ?Patient Account Number: [id number] ?Date of Birth/Sex: Treating RN: ?1945/09/24 (76 y.o. F) [name], [name] ?Primary Care Provider: [name] Other Clinician: ?Referring Provider: ?Treating Provider/Extender: Kalman Shan ?Orpah Melter C ?Weeks in Treatment: 2 ?Information Obtained from: Patient ?Chief Complaint ?08/06/2021; left lower extremity wound following trauma ?Electronic Signature(s) ?Signed: 08/20/2021 11:55:25 AM By: Kalman Shan DO ?Entered By: Kalman Shan on 08/20/2021 10:40:16 ?-------------------------------------------------------------------------------- ?Debridement Details ?Patient Name: Date of Service: ?[name], [name] 08/20/2021 9:00 A M ?Medical Record Number: [id number] ?Patient Account Number: [id number] ?Date of Birth/Sex: Treating RN: ?November 21, 1945 (76 y.o. F) [name], [name] ?Primary Care Provider: [name] Other Clinician: ?Referring Provider: ?Treating Provider/Extender: Kalman Shan ?Orpah Melter C ?Weeks in Treatment: 2 ?Debridement Performed for Assessment: Wound #1 Left,Anterior Lower Leg ?Performed By: Physician Kalman Shan, DO ?Debridement Type: Debridement ?Level of Consciousness (Pre-procedure): Awake and Alert ?Pre-procedure Verification/Time Out Yes - 09:20 ?Taken: ?Start Time: 09:21 ?Pain Control: Lidocaine 5% topical ointment ?T Area Debrided (L x W): ?otal 2.4 (cm) x 2.3 (cm) = 5.52 (cm?) ?Tissue and other material debrided: Viable, Non-Viable, Slough, Subcutaneous, Fibrin/Exudate, Slough ?Level: Skin/Subcutaneous Tissue ?Debridement Description: Excisional ?Instrument: Curette ?Bleeding: Minimum ?Hemostasis Achieved: Pressure ?End Time: 09:37 ?Procedural Pain: 0 ?Post Procedural Pain: 0 ?Response to Treatment: Procedure was tolerated well ?Level of  Consciousness (Post- Awake and Alert ?procedure): ?Post Debridement Measurements of Total Wound ?Length: (cm) 2.4 ?Width: (cm) 2.3 ?Depth: (cm) 0.1 ?Volume: (cm?) 0.434 ?Character of Wound/Ulcer Post Debridement: Improved ?Post Procedure Diagnosis ?Same as Pre-procedure ?Electronic Signature(s) ?Signed: 08/20/2021 11:55:25 AM By: Kalman Shan DO ?Signed: 08/20/2021 5:49:10 PM By: Deon Pilling RN, BSN ?Entered By: Deon Pilling on 08/20/2021 09:37:27 ?-------------------------------------------------------------------------------- ?HPI Details ?Patient Name: Date of Service: ?[name], [name] 08/20/2021 9:00 A M ?Medical Record Number: [id number] ?Patient Account Number: [id number] ?Date of Birth/Sex: Treating RN: ?1946-02-05 (76 y.o. F) [name], [name] ?Primary Care Provider: [name] Other Clinician: ?Referring Provider: ?Treating Provider/Extender: Kalman Shan ?Orpah Melter C ?Weeks in Treatment: 2 ?History of Present Illness ?HPI Description: Admission/20 08/2021 ?Ms. Alynna Hargrove is a 76 year old female with a past medical history of breast cancer status post bilateral mastectomy that presents the clinic for a 63-month?history of nonhealing ulcer to the left lower extremity. On 05/06/2021 while at work she hit her leg against the corner of a desk experiencing a decent laceration. ?She went to the ED and had sutures placed. These have since been removed. She has been on 2 rounds of antibiotics including Keflex and Bactrim for this ?issue. She reports chronic pain to the wound site. She has been using Xeroform with dressing changes. She denies purulent drainage. ?5/1; patient presents for follow-up. She started using gentamicin ointment and Hydrofera Blue over the past week. She reports improvement in her chronic ?pain. She has compression stockings but it is unclear if she is wearing these daily. She currently denies systemic signs of infection. She had a PCR culture ?done at last clinic visit that  showed high levels of Staph aureus. Keystone antibiotics has been ordered and she states she is receiving this soon. ?5/9; patient presents for follow-up. She tolerated the compression wrap well. We have been using gentamicin and Hydrofera Blue under the wrap. She is still not ?obtained her Keystone antibiotics. She states she will try to get this soon. She denies  signs of infection. ?Electronic Signature(s) ?Signed: 08/20/2021 11:55:25 AM By: Kalman Shan DO ?Entered By: Kalman Shan on 08/20/2021 10:41:20 ?-------------------------------------------------------------------------------- ?Physical Exam Details ?Patient Name: Date of Service: ?[name], [name] 08/20/2021 9:00 A M ?Medical Record Number: [id number] ?Patient Account Number: [id number] ?Date of Birth/Sex: Treating RN: ?[birth year] (76 y.o. F) [name], [name] ?Primary Care Provider: [name] Other Clinician: ?Referring Provider: ?Treating Provider/Extender: Kalman Shan ?Orpah Melter C ?Weeks in Treatment: 2 ?Constitutional ?respirations regular, non-labored and within target range for patient.Marland Kitchen ?Cardiovascular ?2+ dorsalis pedis/posterior tibialis pulses. ?Psychiatric ?pleasant and cooperative. ?Notes ?Left lower extremity: T the anterior aspect there is an open wound with granulation tissue and tightly adhered nonviable tissue. No signs of surrounding soft ?o ?tissue infection. decent edema control. ?Electronic Signature(s) ?Signed: 08/20/2021 11:55:25 AM By: Kalman Shan DO ?Entered By: Kalman Shan on 08/20/2021 10:43:08 ?-------------------------------------------------------------------------------- ?Physician Orders Details ?Patient Name: ?Date of Service: ?[name], [name] 08/20/2021 9:00 A M ?Medical Record Number: [id number] ?Patient Account Number: [id number] ?Date of Birth/Sex: ?Treating RN: ?[birth year] (76 y.o. F) [name], [name] ?Primary Care Provider: [name] ?Other Clinician: ?Referring Provider: ?Treating  Provider/Extender: Kalman Shan ?Orpah Melter C ?Weeks in Treatment: 2 ?Verbal / Phone Orders: No ?Diagnosis Coding ?ICD-10 Coding ?Code Description ?V37.106 Non-pressure chronic ulcer of other part of right lower leg with fat layer exposed ?T79.9XXA Unspecified early complication of trauma, initial encounter ?I10 Essential (primary) hypertension ?I89.0 Lymphedema, not elsewhere classified ?I87.2 Venous insufficiency (chronic) (peripheral) ?C50.919 Malignant neoplasm of unspecified site of unspecified female breast ?Follow-up Appointments ?ppointment in 1 week. - Dr. Heber [place] and [name], Room 8 Tuesday 08/27/2021 315pm ?Return A ?Dr. Heber [place] and [place], Room 8 Tuesday 09/03/2021 1115am ?Other: - [employer]- will send out wound care supplies ?[facility] to call you about topical compounding antibiotic. BRING IN AT NEXT APPT TIME.**** ?Bathing/ Shower/ Hygiene ?May shower and wash wound with soap and water. ?Edema Control - Lymphedema / SCD / Other ?Elevate legs to the level of the heart or above for 30 minutes daily and/or when sitting, a frequency of: - 3-4 times a day throughout the day. ?Avoid standing for long periods of time. ?Exercise regularly ?Moisturize legs daily. - both legs every night before bed. ?Wound Treatment ?Wound #1 - Lower Leg Wound Laterality: Left, Anterior ?Cleanser: Soap and Water 1 x Per Week/30 Days ?Discharge Instructions: May shower and wash wound with dial antibacterial soap and water prior to dressing change. ?Peri-Wound Care: Sween Lotion (Moisturizing lotion) 1 x Per Week/30 Days ?Discharge Instructions: Apply moisturizing lotion as directed ?Topical: Mupirocin Ointment 1 x Per Week/30 Days ?Discharge Instructions: Apply Mupirocin (Bactroban) as instructed ?Topical: keystone topical compounding antibiotics 1 x Per Week/30 Days ?Discharge Instructions: will apply mupirocin in clinic and will use compounding antibiotics once it arrives at next appt time. ?Prim Dressing:  Cutimed Sorbact Swab 1 x Per Week/30 Days ?ary ?Discharge Instructions: Apply to wound bed over the ointment and apply hydrogel over the sorbact. ?Secondary Dressing: Zetuvit Plus 4x8 in 1 x Per Week/30 Days ?Dischar

## 2021-08-27 ENCOUNTER — Encounter (HOSPITAL_BASED_OUTPATIENT_CLINIC_OR_DEPARTMENT_OTHER): Payer: No Typology Code available for payment source | Admitting: Internal Medicine

## 2021-08-27 DIAGNOSIS — L97822 Non-pressure chronic ulcer of other part of left lower leg with fat layer exposed: Secondary | ICD-10-CM

## 2021-08-27 DIAGNOSIS — Z9013 Acquired absence of bilateral breasts and nipples: Secondary | ICD-10-CM | POA: Diagnosis not present

## 2021-08-27 DIAGNOSIS — S81812A Laceration without foreign body, left lower leg, initial encounter: Secondary | ICD-10-CM | POA: Diagnosis not present

## 2021-08-27 DIAGNOSIS — I890 Lymphedema, not elsewhere classified: Secondary | ICD-10-CM | POA: Diagnosis not present

## 2021-08-27 DIAGNOSIS — I10 Essential (primary) hypertension: Secondary | ICD-10-CM | POA: Diagnosis not present

## 2021-08-27 DIAGNOSIS — I872 Venous insufficiency (chronic) (peripheral): Secondary | ICD-10-CM | POA: Diagnosis not present

## 2021-08-27 DIAGNOSIS — Z853 Personal history of malignant neoplasm of breast: Secondary | ICD-10-CM | POA: Diagnosis not present

## 2021-08-27 DIAGNOSIS — G8929 Other chronic pain: Secondary | ICD-10-CM | POA: Diagnosis not present

## 2021-08-27 NOTE — Progress Notes (Signed)
[name], RUDA ([id number]) ?Visit Report for 08/27/2021 ?Chief Complaint Document Details ?Patient Name: Date of Service: ?[name], [place] [name] 08/27/2021 3:15 PM ?Medical Record Number: [id number] ?Patient Account Number: [id number] ?Date of Birth/Sex: Treating RN: ?02-Apr-1946 (76 y.o. F) [name] ?Primary Care Provider: [name] Other Clinician: ?Referring Provider: ?Treating Provider/Extender: Kalman Shan ?Orpah Melter C ?Weeks in Treatment: 3 ?Information Obtained from: Patient ?Chief Complaint ?08/06/2021; left lower extremity wound following trauma ?Electronic Signature(s) ?Signed: 08/27/2021 4:36:48 PM By: Kalman Shan DO ?Entered By: Kalman Shan on 08/27/2021 16:31:59 ?-------------------------------------------------------------------------------- ?Debridement Details ?Patient Name: Date of Service: ?[name], [name] 08/27/2021 3:15 PM ?Medical Record Number: [id number] ?Patient Account Number: [id number] ?Date of Birth/Sex: Treating RN: ?18-May-1945 (76 y.o. F) [name] ?Primary Care Provider: [name] Other Clinician: ?Referring Provider: ?Treating Provider/Extender: Kalman Shan ?Orpah Melter C ?Weeks in Treatment: 3 ?Debridement Performed for Assessment: Wound #1 Left,Anterior Lower Leg ?Performed By: Physician Kalman Shan, DO ?Debridement Type: Debridement ?Level of Consciousness (Pre-procedure): Awake and Alert ?Pre-procedure Verification/Time Out Yes - 15:50 ?Taken: ?Start Time: 15:51 ?Pain Control: Lidocaine 5% topical ointment ?T Area Debrided (L x W): ?otal 2.2 (cm) x 2.3 (cm) = 5.06 (cm?) ?Tissue and other material debrided: Viable, Non-Viable, Slough, Subcutaneous, Slough ?Level: Skin/Subcutaneous Tissue ?Debridement Description: Excisional ?Instrument: Curette ?Bleeding: Minimum ?Hemostasis Achieved: Pressure ?End Time: 15:58 ?Procedural Pain: 0 ?Post Procedural Pain: 0 ?Response to Treatment: Procedure was tolerated well ?Level of Consciousness  (Post- Awake and Alert ?procedure): ?Post Debridement Measurements of Total Wound ?Length: (cm) 2.2 ?Width: (cm) 2.3 ?Depth: (cm) 0.1 ?Volume: (cm?) 0.397 ?Character of Wound/Ulcer Post Debridement: Improved ?Post Procedure Diagnosis ?Same as Pre-procedure ?Electronic Signature(s) ?Signed: 08/27/2021 4:36:48 PM By: Kalman Shan DO ?Signed: 08/27/2021 4:59:59 PM By: Deon Pilling RN, BSN ?Entered By: Deon Pilling on 08/27/2021 15:58:31 ?-------------------------------------------------------------------------------- ?HPI Details ?Patient Name: Date of Service: ?[name], [name] 08/27/2021 3:15 PM ?Medical Record Number: [id number] ?Patient Account Number: [id number] ?Date of Birth/Sex: Treating RN: ?December 18, 1945 (76 y.o. F) [name] ?Primary Care Provider: [name] Other Clinician: ?Referring Provider: ?Treating Provider/Extender: Kalman Shan ?Orpah Melter C ?Weeks in Treatment: 3 ?History of Present Illness ?HPI Description: Admission/20 08/2021 ?Ms. Sierra Spargo is a 76 year old female with a past medical history of breast cancer status post bilateral mastectomy that presents the clinic for a 66-month?history of nonhealing ulcer to the left lower extremity. On 05/06/2021 while at work she hit her leg against the corner of a desk experiencing a decent laceration. ?She went to the ED and had sutures placed. These have since been removed. She has been on 2 rounds of antibiotics including Keflex and Bactrim for this ?issue. She reports chronic pain to the wound site. She has been using Xeroform with dressing changes. She denies purulent drainage. ?5/1; patient presents for follow-up. She started using gentamicin ointment and Hydrofera Blue over the past week. She reports improvement in her chronic ?pain. She has compression stockings but it is unclear if she is wearing these daily. She currently denies systemic signs of infection. She had a PCR culture ?done at last clinic visit that showed high levels  of Staph aureus. Keystone antibiotics has been ordered and she states she is receiving this soon. ?5/9; patient presents for follow-up. She tolerated the compression wrap well. We have been using gentamicin and Hydrofera Blue under the wrap. She is still not ?obtained her Keystone antibiotics. She states she will try to get this soon. She denies signs of infection. ?5/16;  patient presents for follow-up. She states she is obtaining Keystone antibiotics today. She has no issues or complaints today. She tolerated the ?compression wrap well. We did in office ABIs and they were 0.97 on the left. ?Electronic Signature(s) ?Signed: 08/27/2021 4:36:48 PM By: Kalman Shan DO ?Entered By: Kalman Shan on 08/27/2021 16:32:32 ?-------------------------------------------------------------------------------- ?Physical Exam Details ?Patient Name: Date of Service: ?[name], [name] 08/27/2021 3:15 PM ?Medical Record Number: [id number] ?Patient Account Number: [id number] ?Date of Birth/Sex: Treating RN: ?01-30-1946 (76 y.o. F) [name] ?Primary Care Provider: [name] Other Clinician: ?Referring Provider: ?Treating Provider/Extender: Kalman Shan ?Orpah Melter C ?Weeks in Treatment: 3 ?Constitutional ?respirations regular, non-labored and within target range for patient.Marland Kitchen ?Cardiovascular ?2+ dorsalis pedis/posterior tibialis pulses. ?Psychiatric ?pleasant and cooperative. ?Notes ?Left lower extremity: T the anterior aspect there is an open wound with granulation tissue and nonviable tissue. There is tightly adhered fibrinous tissue as well. ?o ?No signs of surrounding infection. 2+ pitting edema to the knee. ?Electronic Signature(s) ?Signed: 08/27/2021 4:36:48 PM By: Kalman Shan DO ?Entered By: Kalman Shan on 08/27/2021 16:33:20 ?-------------------------------------------------------------------------------- ?Physician Orders Details ?Patient Name: ?Date of Service: ?[name], [name] 08/27/2021 3:15  PM ?Medical Record Number: [id number] ?Patient Account Number: [id number] ?Date of Birth/Sex: ?Treating RN: ?Jan 17, 1946 (76 y.o. 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 - Dr. Heber [place] [facility] [place], Room 8 Tuesday 09/03/2021 1115am ?Return A ?Dr. Heber [place] [facility] [place], Room 8 Tuesday 09/10/2021 0900am ?Other: - [employer]- will send out wound care supplies ?[facility] to call you about topical compounding antibiotic. BRING IN AT NEXT APPT TIME.**** ?Bathing/ Shower/ Hygiene ?May shower and wash wound with soap and water. ?Edema Control - Lymphedema / SCD / Other ?Elevate legs to the level of the heart or above for 30 minutes daily and/or when sitting, a frequency of: - 3-4 times a day throughout the day. ?Avoid standing for long periods of time. ?Exercise regularly ?Moisturize legs daily. - both legs every night before bed. ?Wound Treatment ?Wound #1 - Lower Leg Wound Laterality: Left, Anterior ?Cleanser: Soap and Water 1 x Per Week/30 Days ?Discharge Instructions: May shower and wash wound with dial antibacterial soap and water prior to dressing change. ?Peri-Wound Care: Sween Lotion (Moisturizing lotion) 1 x Per Week/30 Days ?Discharge Instructions: Apply moisturizing lotion as directed ?Topical: Mupirocin Ointment 1 x Per Week/30 Days ?Discharge Instructions: Apply Mupirocin  (Bactroban) as instructed ?Topical: keystone topical compounding antibiotics 1 x Per Week/30 Days ?Discharge Instructions: will apply mupirocin in clinic and will use compounding antibiotics once it arrives at n

## 2021-08-28 DIAGNOSIS — D175 Benign lipomatous neoplasm of intra-abdominal organs: Secondary | ICD-10-CM | POA: Diagnosis not present

## 2021-08-28 DIAGNOSIS — D125 Benign neoplasm of sigmoid colon: Secondary | ICD-10-CM | POA: Diagnosis not present

## 2021-08-28 DIAGNOSIS — K621 Rectal polyp: Secondary | ICD-10-CM | POA: Diagnosis not present

## 2021-08-28 DIAGNOSIS — Z1211 Encounter for screening for malignant neoplasm of colon: Secondary | ICD-10-CM | POA: Diagnosis not present

## 2021-08-28 DIAGNOSIS — K648 Other hemorrhoids: Secondary | ICD-10-CM | POA: Diagnosis not present

## 2021-08-29 DIAGNOSIS — R350 Frequency of micturition: Secondary | ICD-10-CM | POA: Diagnosis not present

## 2021-08-29 DIAGNOSIS — N3946 Mixed incontinence: Secondary | ICD-10-CM | POA: Diagnosis not present

## 2021-08-30 DIAGNOSIS — K621 Rectal polyp: Secondary | ICD-10-CM | POA: Diagnosis not present

## 2021-08-30 DIAGNOSIS — D125 Benign neoplasm of sigmoid colon: Secondary | ICD-10-CM | POA: Diagnosis not present

## 2021-09-03 ENCOUNTER — Encounter (HOSPITAL_BASED_OUTPATIENT_CLINIC_OR_DEPARTMENT_OTHER): Payer: No Typology Code available for payment source | Admitting: Internal Medicine

## 2021-09-03 DIAGNOSIS — G8929 Other chronic pain: Secondary | ICD-10-CM | POA: Diagnosis not present

## 2021-09-03 DIAGNOSIS — L97822 Non-pressure chronic ulcer of other part of left lower leg with fat layer exposed: Secondary | ICD-10-CM

## 2021-09-03 DIAGNOSIS — Z9013 Acquired absence of bilateral breasts and nipples: Secondary | ICD-10-CM | POA: Diagnosis not present

## 2021-09-03 DIAGNOSIS — S81812A Laceration without foreign body, left lower leg, initial encounter: Secondary | ICD-10-CM | POA: Diagnosis not present

## 2021-09-03 DIAGNOSIS — Z853 Personal history of malignant neoplasm of breast: Secondary | ICD-10-CM | POA: Diagnosis not present

## 2021-09-03 DIAGNOSIS — I872 Venous insufficiency (chronic) (peripheral): Secondary | ICD-10-CM | POA: Diagnosis not present

## 2021-09-03 DIAGNOSIS — I890 Lymphedema, not elsewhere classified: Secondary | ICD-10-CM | POA: Diagnosis not present

## 2021-09-03 DIAGNOSIS — I10 Essential (primary) hypertension: Secondary | ICD-10-CM | POA: Diagnosis not present

## 2021-09-03 NOTE — Progress Notes (Signed)
RUBYLEE, ZAMARRIPA ([id number]) Visit Report for 09/03/2021 Arrival Information Details Patient Name: Date of Service: Marylene Land, [place] 09/03/2021 3:15 PM Medical Record Number: [id number] Patient Account Number: [id number] Date of Birth/Sex: Treating RN: 03-15-1946 (76 y.o. Tonita Phoenix, Lauren Primary Care Vince Ainsley: [name] Other Clinician: Referring Jariyah Hackley: Treating Donnette Macmullen/Extender: Loreen Freud in Treatment: 4 Visit Information History Since Last Visit Added or deleted any medications: No Patient Arrived: [name] Any new allergies or adverse reactions: No Arrival Time: 15:13 Had a fall or experienced change in No Accompanied By: self activities of daily living that may affect Transfer Assistance: None risk of falls: Patient Identification Verified: Yes Signs or symptoms of abuse/neglect since last visito No Secondary Verification Process Completed: Yes Hospitalized since last visit: No Patient Requires Transmission-Based Precautions: No Implantable device outside of the clinic excluding No Patient Has Alerts: No cellular tissue based products placed in the center since last visit: Has Dressing in Place as Prescribed: Yes Has Compression in Place as Prescribed: Yes Pain Present Now: No Electronic Signature(s) Signed: 09/03/2021 4:06:07 PM By: Rhae Hammock RN Entered By: Rhae Hammock on 09/03/2021 15:13:20 -------------------------------------------------------------------------------- Compression Therapy Details Patient Name: Date of Service: [name], [name] 09/03/2021 3:15 PM Medical Record Number: [id number] Patient Account Number: [id number] Date of Birth/Sex: Treating RN: September 16, 1945 (76 y.o. Tonita Phoenix, Lauren Primary Care Alexzia Kasler: [name] Other Clinician: Referring Zauria Dombek: Treating Elodia Haviland/Extender: Loreen Freud in Treatment: 4 Compression Therapy Performed for Wound  Assessment: Wound #1 Left,Anterior Lower Leg Performed By: Clinician Rhae Hammock, RN Compression Type: Three Layer Post Procedure Diagnosis Same as Pre-procedure Electronic Signature(s) Signed: 09/03/2021 4:06:07 PM By: Rhae Hammock RN Entered By: Rhae Hammock on 09/03/2021 15:29:11 -------------------------------------------------------------------------------- Lower Extremity Assessment Details Patient Name: Date of Service: [name], [name] 09/03/2021 3:15 PM Medical Record Number: [id number] Patient Account Number: [id number] Date of Birth/Sex: Treating RN: 12-05-1945 (76 y.o. Tonita Phoenix, Lauren Primary Care Melady Chow: [name] Other Clinician: Referring Miko Markwood: Treating Nickson Middlesworth/Extender: Loreen Freud in Treatment: 4 Edema Assessment Assessed: [name]: Yes] [name]: No] Edema: [Left: Ye] [Right: s] Calf Left: Right: Point of Measurement: 30 cm From Medial Instep 46.7 cm Ankle Left: Right: Point of Measurement: 9 cm From Medial Instep 28.7 cm Vascular Assessment Pulses: Dorsalis Pedis Palpable: [Left:Yes] Posterior Tibial Palpable: [Left:Yes] Electronic Signature(s) Signed: 09/03/2021 4:06:07 PM By: Rhae Hammock RN Entered By: Rhae Hammock on 09/03/2021 15:13:58 -------------------------------------------------------------------------------- Multi Wound Chart Details Patient Name: Date of Service: [name], [name] 09/03/2021 3:15 PM Medical Record Number: [id number] Patient Account Number: [id number] Date of Birth/Sex: Treating RN: 01-Oct-1945 (76 y.o. [name], [name] Primary Care Austina Constantin: [name] Other Clinician: Referring Taylor Levick: Treating Hasini Peachey/Extender: Loreen Freud in Treatment: 4 Vital Signs Height(in): 63 Pulse(bpm): 84 Weight(lbs): 220 Blood Pressure(mmHg): 161/69 Body Mass Index(BMI): 39 Temperature(F): 99.1 Respiratory Rate(breaths/min):  17 Photos: [1:Left, Anterior Lower Leg] [N/A:N/A N/A] Wound Location: [1:Trauma] [N/A:N/A] Wounding Event: [1:Abrasion] [N/A:N/A] Primary Etiology: [1:Lymphedema] [N/A:N/A] Secondary Etiology: [1:Hypertension] [N/A:N/A] Comorbid History: [1:05/06/2021] [N/A:N/A] Date Acquired: [1:4] [N/A:N/A] Weeks of Treatment: [1:Open] [N/A:N/A] Wound Status: [1:No] [N/A:N/A] Wound Recurrence: [1:2x2x0.2] [N/A:N/A] Measurements L x W x D (cm) [1:3.142] [N/A:N/A] A (cm) : rea [1:0.628] [N/A:N/A] Volume (cm) : [1:4.80%] [N/A:N/A] % Reduction in A rea: [1:-90.30%] [N/A:N/A] % Reduction in Volume: [1:Full Thickness Without Exposed] [N/A:N/A] Classification: [1:Support Structures Medium] [N/A:N/A] Exudate A mount: [1:Serosanguineous] [N/A:N/A] Exudate Type: [1:red, brown] [N/A:N/A] Exudate  Color: [1:Distinct, outline attached] [N/A:N/A] Wound Margin: [1:Large (67-100%)] [N/A:N/A] Granulation A mount: [1:Red, Pink, Hyper-granulation] [N/A:N/A] Granulation Quality: [1:Small (1-33%)] [N/A:N/A] Necrotic A mount: [1:Fat Layer (Subcutaneous Tissue): Yes N/A] Exposed Structures: [1:Fascia: No Tendon: No Muscle: No Joint: No Bone: No Small (1-33%)] [N/A:N/A] Epithelialization: [1:Debridement - Excisional] [N/A:N/A] Debridement: Pre-procedure Verification/Time Out 15:30 [N/A:N/A] Taken: [1:Lidocaine] [N/A:N/A] Pain Control: [1:Subcutaneous, Slough] [N/A:N/A] Tissue Debrided: [1:Skin/Subcutaneous Tissue] [N/A:N/A] Level: [1:4] [N/A:N/A] Debridement A (sq cm): [1:rea Curette] [N/A:N/A] Instrument: [1:Minimum] [N/A:N/A] Bleeding: [1:Pressure] [N/A:N/A] Hemostasis A chieved: [1:0] [N/A:N/A] Procedural Pain: [1:0] [N/A:N/A] Post Procedural Pain: [1:Procedure was tolerated well] [N/A:N/A] Debridement Treatment Response: [1:2x2x0.2] [N/A:N/A] Post Debridement Measurements L x W x D (cm) [1:0.628] [N/A:N/A] Post Debridement Volume: (cm) [1:Compression Therapy] [N/A:N/A] Procedures Performed:  [1:Debridement] Treatment Notes Wound #1 (Lower Leg) Wound Laterality: Left, Anterior Cleanser Soap and Water Discharge Instruction: May shower and wash wound with dial antibacterial soap and water prior to dressing change. Peri-Wound Care Sween Lotion (Moisturizing lotion) Discharge Instruction: Apply moisturizing lotion as directed Topical keystone topical compounding antibiotics Discharge Instruction: will apply mupirocin in clinic and will use compounding antibiotics once it arrives at next appt time. Primary Dressing Cutimed Sorbact Swab Discharge Instruction: Apply to wound bed over the ointment and apply hydrogel over the sorbact. Secondary Dressing Zetuvit Plus 4x8 in Discharge Instruction: Apply over primary dressing as directed. Secured With Compression Wrap ThreePress (3 layer compression wrap) Discharge Instruction: Apply three layer compression as directed. Compression Stockings Add-Ons Electronic Signature(s) Signed: 09/03/2021 4:06:56 PM By: Kalman Shan DO Signed: 09/03/2021 4:34:51 PM By: Deon Pilling RN, BSN Entered By: Kalman Shan on 09/03/2021 16:03:36 -------------------------------------------------------------------------------- Multi-Disciplinary Care Plan Details Patient Name: Date of Service: [name], [name] 09/03/2021 3:15 PM Medical Record Number: [id number] Patient Account Number: [id number] Date of Birth/Sex: Treating RN: 14-Nov-1945 (76 y.o. Tonita Phoenix, Lauren Primary Care Takari Duncombe: [name] Other Clinician: Referring Keliyah Spillman: Treating Kemet Nijjar/Extender: Loreen Freud in Treatment: 4 Active Inactive Pain, Acute or Chronic Nursing Diagnoses: Pain, acute or chronic: actual or potential Potential alteration in comfort, pain Goals: Patient will verbalize adequate pain control and receive pain control interventions during procedures as needed Date Initiated: 08/06/2021 Target Resolution Date:  10/04/2021 Goal Status: Active Patient/caregiver will verbalize comfort level met Date Initiated: 08/06/2021 Target Resolution Date: 10/10/2021 Goal Status: Active Interventions: Encourage patient to take pain medications as prescribed Provide education on pain management Reposition patient for comfort Treatment Activities: Administer pain control measures as ordered : 08/06/2021 Notes: Wound/Skin Impairment Nursing Diagnoses: Knowledge deficit related to ulceration/compromised skin integrity Goals: Patient/caregiver will verbalize understanding of skin care regimen Date Initiated: 08/06/2021 Target Resolution Date: 10/10/2021 Goal Status: Active Interventions: Assess patient/caregiver ability to obtain necessary supplies Assess patient/caregiver ability to perform ulcer/skin care regimen upon admission and as needed Provide education on ulcer and skin care Treatment Activities: Skin care regimen initiated : 08/06/2021 Topical wound management initiated : 08/06/2021 Notes: Electronic Signature(s) Signed: 09/03/2021 4:06:07 PM By: Rhae Hammock RN Entered By: Rhae Hammock on 09/03/2021 15:21:23 -------------------------------------------------------------------------------- Pain Assessment Details Patient Name: Date of Service: [name], [name] 09/03/2021 3:15 PM Medical Record Number: [id number] Patient Account Number: [id number] Date of Birth/Sex: Treating RN: 1946-01-05 (76 y.o. Tonita Phoenix, Lauren Primary Care Chanan Detwiler: [name] Other Clinician: Referring Artina Minella: Treating Jevonte Clanton/Extender: Loreen Freud in Treatment: 4 Active Problems Location of Pain Severity and Description of Pain Patient Has Paino No Site Locations Pain Management and Medication Current Pain Management: Electronic Signature(s)  Signed: 09/03/2021 4:06:07 PM By: Rhae Hammock RN Entered By: Rhae Hammock on 09/03/2021  15:13:32 -------------------------------------------------------------------------------- Patient/Caregiver Education Details Patient Name: Date of Service: [name], [name] 5/23/2023andnbsp3:15 PM Medical Record Number: [id number] Patient Account Number: [id number] Date of Birth/Gender: Treating RN: 30-Nov-1945 (76 y.o. Tonita Phoenix, Lauren Primary Care Physician: [name] Other Clinician: Referring Physician: Treating Physician/Extender: Loreen Freud in Treatment: 4 Education Assessment Education Provided To: Patient Education Topics Provided Wound/Skin Impairment: Methods: Explain/Verbal Responses: Reinforcements needed, State content correctly Electronic Signature(s) Signed: 09/03/2021 4:06:07 PM By: Rhae Hammock RN Entered By: Rhae Hammock on 09/03/2021 15:21:49 -------------------------------------------------------------------------------- Wound Assessment Details Patient Name: Date of Service: [name], [name] 09/03/2021 3:15 PM Medical Record Number: [id number] Patient Account Number: [id number] Date of Birth/Sex: Treating RN: January 10, 1946 (76 y.o. Tonita Phoenix, Lauren Primary Care Dailan Pfalzgraf: [name] Other Clinician: Referring Diago Haik: Treating Neriah Brott/Extender: Loreen Freud in Treatment: 4 Wound Status Wound Number: 1 Primary Etiology: Abrasion Wound Location: Left, Anterior Lower Leg Secondary Etiology: Lymphedema Wounding Event: Trauma Wound Status: Open Date Acquired: 05/06/2021 Comorbid History: Hypertension Weeks Of Treatment: 4 Clustered Wound: No Photos Wound Measurements Length: (cm) 2 Width: (cm) 2 Depth: (cm) 0.2 Area: (cm) 3.142 Volume: (cm) 0.628 % Reduction in Area: 4.8% % Reduction in Volume: -90.3% Epithelialization: Small (1-33%) Tunneling: No Undermining: No Wound Description Classification: Full Thickness Without Exposed Support Structures Wound  Margin: Distinct, outline attached Exudate Amount: Medium Exudate Type: Serosanguineous Exudate Color: red, brown Foul Odor After Cleansing: No Slough/Fibrino Yes Wound Bed Granulation Amount: Large (67-100%) Exposed Structure Granulation Quality: Red, Pink, Hyper-granulation Fascia Exposed: No Necrotic Amount: Small (1-33%) Fat Layer (Subcutaneous Tissue) Exposed: Yes Necrotic Quality: Adherent Slough Tendon Exposed: No Muscle Exposed: No Joint Exposed: No Bone Exposed: No Treatment Notes Wound #1 (Lower Leg) Wound Laterality: Left, Anterior Cleanser Soap and Water Discharge Instruction: May shower and wash wound with dial antibacterial soap and water prior to dressing change. Peri-Wound Care Sween Lotion (Moisturizing lotion) Discharge Instruction: Apply moisturizing lotion as directed Topical keystone topical compounding antibiotics Discharge Instruction: will apply mupirocin in clinic and will use compounding antibiotics once it arrives at next appt time. Primary Dressing Cutimed Sorbact Swab Discharge Instruction: Apply to wound bed over the ointment and apply hydrogel over the sorbact. Secondary Dressing Zetuvit Plus 4x8 in Discharge Instruction: Apply over primary dressing as directed. Secured With Compression Wrap ThreePress (3 layer compression wrap) Discharge Instruction: Apply three layer compression as directed. Compression Stockings Add-Ons Electronic Signature(s) Signed: 09/03/2021 4:06:07 PM By: Rhae Hammock RN Entered By: Rhae Hammock on 09/03/2021 15:19:43 -------------------------------------------------------------------------------- Vitals Details Patient Name: Date of Service: [name], [name] 09/03/2021 3:15 PM Medical Record Number: [id number] Patient Account Number: [id number] Date of Birth/Sex: Treating RN: [birth year] (76 y.o. Tonita Phoenix, Lauren Primary Care Jeannetta Cerutti: [name] Other Clinician: Referring  Mckenleigh Tarlton: Treating Emelly Wurtz/Extender: Loreen Freud in Treatment: 4 Vital Signs Time Taken: 15:13 Temperature (F): 99.1 Height (in): 63 Pulse (bpm): 84 Weight (lbs): 220 Respiratory Rate (breaths/min): 17 Body Mass Index (BMI): 39 Blood Pressure (mmHg): 161/69 Reference Range: 80 - 120 mg / dl Electronic Signature(s) Signed: 09/03/2021 4:06:07 PM By: Rhae Hammock RN Entered By: Rhae Hammock on 09/03/2021 15:18:05

## 2021-09-03 NOTE — Progress Notes (Signed)
JEREMIE, ABDELAZIZ ([id number]) Visit Report for 09/03/2021 Chief Complaint Document Details Patient Name: Date of Service: [name], [place] [name] 09/03/2021 3:15 PM Medical Record Number: [id number] Patient Account Number: [id number] Date of Birth/Sex: Treating RN: 06/12/1945 (76 y.o. [name], [name] in Treatment: 4 Information Obtained from: Patient Chief Complaint 08/06/2021; left lower extremity wound following trauma Electronic Signature(s) Signed: 09/03/2021 4:06:56 PM By: Kalman Shan DO Entered By: Kalman Shan on 09/03/2021 16:03:48 -------------------------------------------------------------------------------- Debridement Details Patient Name: Date of Service: CO [name], [name] 09/03/2021 3:15 PM Medical Record Number: [id number] Patient Account Number: [id number] Date of Birth/Sex: Treating RN: [birth year] (76 y.o. Tonita Phoenix, Lauren Primary Care Provider: [name] Other Clinician: Referring Provider: Treating Provider/Extender: Loreen Freud in Treatment: 4 Debridement Performed for Assessment: Wound #1 Left,Anterior Lower Leg Performed By: Physician Kalman Shan, DO Debridement Type: Debridement Level of Consciousness (Pre-procedure): Awake and Alert Pre-procedure Verification/Time Out Yes - 15:30 Taken: Start Time: 15:30 Pain Control: Lidocaine T Area Debrided (L x W): otal 2 (cm) x 2 (cm) = 4 (cm) Tissue and other material debrided: Viable, Non-Viable, Slough, Subcutaneous, Slough Level: Skin/Subcutaneous Tissue Debridement Description: Excisional Instrument: Curette Bleeding: Minimum Hemostasis Achieved: Pressure End Time: 15:30 Procedural Pain: 0 Post Procedural Pain: 0 Response to Treatment: Procedure was tolerated well Level of Consciousness (Post- Awake and  Alert procedure): Post Debridement Measurements of Total Wound Length: (cm) 2 Width: (cm) 2 Depth: (cm) 0.2 Volume: (cm) 0.628 Character of Wound/Ulcer Post Debridement: Improved Post Procedure Diagnosis Same as Pre-procedure Electronic Signature(s) Signed: 09/03/2021 4:06:07 PM By: Rhae Hammock RN Signed: 09/03/2021 4:06:56 PM By: Kalman Shan DO Entered By: Rhae Hammock on 09/03/2021 15:28:59 -------------------------------------------------------------------------------- HPI Details Patient Name: Date of Service: CO [name], [name] 09/03/2021 3:15 PM Medical Record Number: [id number] Patient Account Number: [id number] Date of Birth/Sex: Treating RN: 06-15-1945 (76 y.o. [name] Primary Care Provider: [name] Other Clinician: Referring Provider: Treating Provider/Extender: Loreen Freud in Treatment: 4 History of Present Illness HPI Description: Admission/20 08/2021 Ms. [name] is a 76 year old female with a past medical history of breast cancer status post bilateral mastectomy that presents the clinic for a 14-monthhistory of nonhealing ulcer to the left lower extremity. On 05/06/2021 while at work she hit her leg against the corner of a desk experiencing a decent laceration. She went to the ED and had sutures placed. These have since been removed. She has been on 2 rounds of antibiotics including Keflex and Bactrim for this issue. She reports chronic pain to the wound site. She has been using Xeroform with dressing changes. She denies purulent drainage. 5/1; patient presents for follow-up. She started using gentamicin ointment and Hydrofera Blue over the past week. She reports improvement in her chronic pain. She has compression stockings but it is unclear if she is wearing these daily. She currently denies systemic signs of infection. She had a PCR culture done at last clinic visit that showed high levels of Staph aureus.  Keystone antibiotics has been ordered and she states she is receiving this soon. 5/9; patient presents for follow-up. She tolerated the compression wrap well. We have been using gentamicin and Hydrofera Blue under the wrap. She is still not obtained her Keystone antibiotics. She states she will try to get this soon. She denies signs of infection. 5/16; patient presents for follow-up.  She states she is obtaining Keystone antibiotics today. She has no issues or complaints today. She tolerated the compression wrap well. We did in office ABIs and they were 0.97 on the left. 5/23; patient presents for follow-up. She has her Keystone antibiotics with her today. She has no issues or complaints. She tolerated the compression wrap well. Electronic Signature(s) Signed: 09/03/2021 4:06:56 PM By: Kalman Shan DO Entered By: Kalman Shan on 09/03/2021 16:04:15 -------------------------------------------------------------------------------- Physical Exam Details Patient Name: Date of Service: CO [name], [name] 09/03/2021 3:15 PM Medical Record Number: [id number] Patient Account Number: [id number] Date of Birth/Sex: Treating RN: 02-Nov-1945 (76 y.o. [name] Primary Care Provider: [name] Other Clinician: Referring Provider: Treating Provider/Extender: Loreen Freud in Treatment: 4 Constitutional respirations regular, non-labored and within target range for patient.. Cardiovascular 2+ dorsalis pedis/posterior tibialis pulses. Psychiatric pleasant and cooperative. Notes Left lower extremity: T the anterior aspect there is an open wound with granulation tissue and tightly adhered nonviable tissue. No surrounding signs of o infection. Good edema control. Electronic Signature(s) Signed: 09/03/2021 4:06:56 PM By: Kalman Shan DO Entered By: Kalman Shan on 09/03/2021  16:04:56 -------------------------------------------------------------------------------- Physician Orders Details Patient Name: Date of Service: CO [name], [name] 09/03/2021 3:15 PM Medical Record Number: [id number] Patient Account Number: [id number] Date of Birth/Sex: Treating RN: [birth year] (76 y.o. Tonita Phoenix, Lauren Primary Care Provider: [name] Other Clinician: Referring Provider: Treating Provider/Extender: Loreen Freud in Treatment: 4 Verbal / Phone Orders: No Diagnosis Coding Follow-up Appointments ppointment in 1 week. - Dr. Heber [place] and [place], Room 8 Tuesday 09/10/2021 0900am Return A Other: - [employer]- will send out wound care supplies [place] to call you about topical compounding antibiotic. BRING IN AT NEXT APPT TIME.**** Bathing/ Shower/ Hygiene May shower and wash wound with soap and water. Edema Control - Lymphedema / SCD / Other Elevate legs to the level of the heart or above for 30 minutes daily and/or when sitting, a frequency of: - 3-4 times a day throughout the day. Avoid standing for long periods of time. Exercise regularly Moisturize legs daily. - both legs every night before bed. Wound Treatment Wound #1 - Lower Leg Wound Laterality: Left, Anterior Cleanser: Soap and Water 1 x Per Week/30 Days Discharge Instructions: May shower and wash wound with dial antibacterial soap and water prior to dressing change. Peri-Wound Care: Sween Lotion (Moisturizing lotion) 1 x Per Week/30 Days Discharge Instructions: Apply moisturizing lotion as directed Topical: keystone topical compounding antibiotics 1 x Per Week/30 Days Discharge Instructions: will apply mupirocin in clinic and will use compounding antibiotics once it arrives at next appt time. Prim Dressing: Cutimed Sorbact Swab 1 x Per Week/30 Days ary Discharge Instructions: Apply to wound bed over the ointment and apply hydrogel over the  sorbact. Secondary Dressing: Zetuvit Plus 4x8 in 1 x Per Week/30 Days Discharge Instructions: Apply over primary dressing as directed. Compression Wrap: ThreePress (3 layer compression wrap) 1 x Per Week/30 Days Discharge Instructions: Apply three layer compression as directed. Electronic Signature(s) Signed: 09/03/2021 4:06:56 PM By: Kalman Shan DO Entered By: Kalman Shan on 09/03/2021 16:05:05 -------------------------------------------------------------------------------- Problem List Details Patient Name: Date of Service: CO [name], [name] 09/03/2021 3:15 PM Medical Record Number: [id number] Patient Account Number: [id number] Date of Birth/Sex: Treating RN: 11-Aug-1945 (76 y.o. [name], [name] in Treatment: 4 Active  Problems ICD-10 Encounter Code Description Active Date MDM Diagnosis L97.822 Non-pressure chronic ulcer of other part of left lower leg with fat layer exposed5/12/2021 No Yes T79.9XXA Unspecified early complication of trauma, initial encounter 08/06/2021 No Yes I10 Essential (primary) hypertension 08/06/2021 No Yes I89.0 Lymphedema, not elsewhere classified 08/06/2021 No Yes I87.2 Venous insufficiency (chronic) (peripheral) 08/06/2021 No Yes C50.919 Malignant neoplasm of unspecified site of unspecified female breast 08/06/2021 No Yes Inactive Problems Resolved Problems Electronic Signature(s) Signed: 09/03/2021 4:06:56 PM By: Kalman Shan DO Entered By: Kalman Shan on 09/03/2021 16:03:29 -------------------------------------------------------------------------------- Progress Note Details Patient Name: Date of Service: CO [name], [name] 09/03/2021 3:15 PM Medical Record Number: [id number] Patient Account Number: [id number] Date of Birth/Sex: Treating RN: 1945/12/21 (76 y.o. [name] Primary Care  Provider: [name] Other Clinician: Referring Provider: Treating Provider/Extender: Loreen Freud in Treatment: 4 Subjective Chief Complaint Information obtained from Patient 08/06/2021; left lower extremity wound following trauma History of Present Illness (HPI) Admission/20 08/2021 [name] is a 76 year old female with a past medical history of breast cancer status post bilateral mastectomy that presents the clinic for a 43-monthhistory of nonhealing ulcer to the left lower extremity. On 05/06/2021 while at work she hit her leg against the corner of a desk experiencing a decent laceration. She went to the ED and had sutures placed. These have since been removed. She has been on 2 rounds of antibiotics including Keflex and Bactrim for this issue. She reports chronic pain to the wound site. She has been using Xeroform with dressing changes. She denies purulent drainage. 5/1; patient presents for follow-up. She started using gentamicin ointment and Hydrofera Blue over the past week. She reports improvement in her chronic pain. She has compression stockings but it is unclear if she is wearing these daily. She currently denies systemic signs of infection. She had a PCR culture done at last clinic visit that showed high levels of Staph aureus. Keystone antibiotics has been ordered and she states she is receiving this soon. 5/9; patient presents for follow-up. She tolerated the compression wrap well. We have been using gentamicin and Hydrofera Blue under the wrap. She is still not obtained her Keystone antibiotics. She states she will try to get this soon. She denies signs of infection. 5/16; patient presents for follow-up. She states she is obtaining Keystone antibiotics today. She has no issues or complaints today. She tolerated the compression wrap well. We did in office ABIs and they were 0.97 on the left. 5/23; patient presents for follow-up. She has her  Keystone antibiotics with her today. She has no issues or complaints. She tolerated the compression wrap well. Patient History Family History Cancer - Father, Diabetes - Father, Heart Disease - Father, Hypertension - Father. Social History Former smoker - quit 1998, Marital Status - Married, Alcohol Use - Never, Drug Use - No History, Caffeine Use - Never. Medical History Cardiovascular Patient has history of Hypertension Hospitalization/Surgery History - eyes surgery. - tonsillectomy. - hysterectomy. - bilateral mastectomy. - deviated septum from a pear falling from a tree.. Medical A Surgical History Notes nd Constitutional Symptoms (General Health) Bilateral mastectomy Breast Cancer Vitamin D deficiency Cardiovascular Ventricular tachycardia Objective Constitutional respirations regular, non-labored and within target range for patient.. Vitals Time Taken: 3:13 PM, Height: 63 in, Weight: 220 lbs, BMI: 39, Temperature: 99.1 F, Pulse: 84 bpm, Respiratory Rate: 17 breaths/min, Blood Pressure: 161/69 mmHg. Cardiovascular 2+ dorsalis pedis/posterior tibialis pulses. Psychiatric pleasant and cooperative. General Notes:  Left lower extremity: T the anterior aspect there is an open wound with granulation tissue and tightly adhered nonviable tissue. No surrounding o signs of infection. Good edema control. Integumentary (Hair, Skin) Wound #1 status is Open. Original cause of wound was Trauma. The date acquired was: 05/06/2021. The wound has been in treatment 4 weeks. The wound is located on the Left,Anterior Lower Leg. The wound measures 2cm length x 2cm width x 0.2cm depth; 3.142cm^2 area and 0.628cm^3 volume. There is Fat Layer (Subcutaneous Tissue) exposed. There is no tunneling or undermining noted. There is a medium amount of serosanguineous drainage noted. The wound margin is distinct with the outline attached to the wound base. There is large (67-100%) red, pink, hyper - granulation  within the wound bed. There is a small (1- 33%) amount of necrotic tissue within the wound bed including Adherent Slough. Assessment Active Problems ICD-10 Non-pressure chronic ulcer of other part of left lower leg with fat layer exposed Unspecified early complication of trauma, initial encounter Essential (primary) hypertension Lymphedema, not elsewhere classified Venous insufficiency (chronic) (peripheral) Malignant neoplasm of unspecified site of unspecified female breast Patient's wound has shown improvement in size and appearance since last clinic visit. I debrided nonviable tissue. I recommended starting Keystone antibiotic along with Sorbact under 3 layer compression. Procedures Wound #1 Pre-procedure diagnosis of Wound #1 is an Abrasion located on the Left,Anterior Lower Leg . There was a Excisional Skin/Subcutaneous Tissue Debridement with a total area of 4 sq cm performed by Kalman Shan, DO. With the following instrument(s): Curette to remove Viable and Non-Viable tissue/material. Material removed includes Subcutaneous Tissue and Slough and after achieving pain control using Lidocaine. No specimens were taken. A time out was conducted at 15:30, prior to the start of the procedure. A Minimum amount of bleeding was controlled with Pressure. The procedure was tolerated well with a pain level of 0 throughout and a pain level of 0 following the procedure. Post Debridement Measurements: 2cm length x 2cm width x 0.2cm depth; 0.628cm^3 volume. Character of Wound/Ulcer Post Debridement is improved. Post procedure Diagnosis Wound #1: Same as Pre-Procedure Pre-procedure diagnosis of Wound #1 is an Abrasion located on the Left,Anterior Lower Leg . There was a Three Layer Compression Therapy Procedure by Rhae Hammock, RN. Post procedure Diagnosis Wound #1: Same as Pre-Procedure Plan Follow-up Appointments: Return Appointment in 1 week. - Dr. Heber [place] and [place], Room 8 Tuesday  09/10/2021 0900am Other: [employer]- will send out wound care supplies [place] to call you about topical compounding antibiotic. BRING IN AT NEXT APPT TIME.**** Bathing/ Shower/ Hygiene: May shower and wash wound with soap and water. Edema Control - Lymphedema / SCD / Other: Elevate legs to the level of the heart or above for 30 minutes daily and/or when sitting, a frequency of: - 3-4 times a day throughout the day. Avoid standing for long periods of time. Exercise regularly Moisturize legs daily. - both legs every night before bed. WOUND #1: - Lower Leg Wound Laterality: Left, Anterior Cleanser: Soap and Water 1 x Per Week/30 Days Discharge Instructions: May shower and wash wound with dial antibacterial soap and water prior to dressing change. Peri-Wound Care: Sween Lotion (Moisturizing lotion) 1 x Per Week/30 Days Discharge Instructions: Apply moisturizing lotion as directed Topical: keystone topical compounding antibiotics 1 x Per Week/30 Days Discharge Instructions: will apply mupirocin in clinic and will use compounding antibiotics once it arrives at next appt time. Prim Dressing: Cutimed Sorbact Swab 1 x Per Week/30 Days  ary Discharge Instructions: Apply to wound bed over the ointment and apply hydrogel over the sorbact. Secondary Dressing: Zetuvit Plus 4x8 in 1 x Per Week/30 Days Discharge Instructions: Apply over primary dressing as directed. Com pression Wrap: ThreePress (3 layer compression wrap) 1 x Per Week/30 Days Discharge Instructions: Apply three layer compression as directed. 1. In office sharp debridement 2. Sorbact with Keystone antibiotic under 3 layer compression Electronic Signature(s) Signed: 09/03/2021 4:06:56 PM By: Kalman Shan DO Entered By: Kalman Shan on 09/03/2021 16:05:55 -------------------------------------------------------------------------------- HxROS Details Patient Name: Date of Service: [name], [name] 09/03/2021 3:15  PM Medical Record Number: [id number] Patient Account Number: [id number] Date of Birth/Sex: Treating RN: 1946-01-11 (76 y.o. [name] Primary Care Provider: Other Clinician: [name] Referring Provider: Treating Provider/Extender: Loreen Freud in Treatment: 4 Constitutional Symptoms (General Health) Medical History: Past Medical History Notes: Bilateral mastectomy Breast Cancer Vitamin D deficiency Cardiovascular Medical History: Positive for: Hypertension Past Medical History Notes: Ventricular tachycardia Immunizations Pneumococcal Vaccine: Received Pneumococcal Vaccination: No Implantable Devices No devices added Hospitalization / Surgery History Type of Hospitalization/Surgery eyes surgery tonsillectomy hysterectomy bilateral mastectomy deviated septum from a pear falling from a tree. Family and Social History Cancer: Yes - Father; Diabetes: Yes - Father; Heart Disease: Yes - Father; Hypertension: Yes - Father; Former smoker - quit 1998; Marital Status - Married; Alcohol Use: Never; Drug Use: No History; Caffeine Use: Never; Financial Concerns: No; Food, Clothing or Shelter Needs: No; Support System Lacking: No; Transportation Concerns: No Electronic Signature(s) Signed: 09/03/2021 4:06:56 PM By: Kalman Shan DO Signed: 09/03/2021 4:34:51 PM By: Deon Pilling RN, BSN Entered By: Kalman Shan on 09/03/2021 16:04:24 -------------------------------------------------------------------------------- SuperBill Details Patient Name: Date of Service: CO [name], [name] 09/03/2021 Medical Record Number: [id number] Patient Account Number: [id number] Date of Birth/Sex: Treating RN: Aug 23, 1945 (76 y.o. Tonita Phoenix, Lauren Primary Care Provider: [name] Other Clinician: Referring Provider: Treating Provider/Extender: Loreen Freud in Treatment: 4 Diagnosis Coding ICD-10 Codes Code  Description [phone] Non-pressure chronic ulcer of other part of left lower leg with fat layer exposed T79.9XXA Unspecified early complication of trauma, initial encounter I10 Essential (primary) hypertension I89.0 Lymphedema, not elsewhere classified I87.2 Venous insufficiency (chronic) (peripheral) C50.919 Malignant neoplasm of unspecified site of unspecified female breast Facility Procedures CPT4 Code: [id number] Description: 97416 - DEB SUBQ TISSUE 20 SQ CM/< ICD-10 Diagnosis Description L97.822 Non-pressure chronic ulcer of other part of left lower leg with fat layer expo Modifier: sed Quantity: 1 Physician Procedures : CPT4 Code Description Modifier [id number] - WC PHYS SUBQ TISS 20 SQ CM ICD-10 Diagnosis Description L97.822 Non-pressure chronic ulcer of other part of left lower leg with fat layer exposed Quantity: 1 Electronic Signature(s) Signed: 09/03/2021 4:06:56 PM By: Kalman Shan DO Previous Signature: 09/03/2021 4:06:07 PM Version By: Rhae Hammock RN Entered By: Kalman Shan on 09/03/2021 16:06:34

## 2021-09-10 ENCOUNTER — Encounter (HOSPITAL_BASED_OUTPATIENT_CLINIC_OR_DEPARTMENT_OTHER): Payer: No Typology Code available for payment source | Admitting: Internal Medicine

## 2021-09-10 DIAGNOSIS — I10 Essential (primary) hypertension: Secondary | ICD-10-CM | POA: Diagnosis not present

## 2021-09-10 DIAGNOSIS — S81812A Laceration without foreign body, left lower leg, initial encounter: Secondary | ICD-10-CM | POA: Diagnosis not present

## 2021-09-10 DIAGNOSIS — L97822 Non-pressure chronic ulcer of other part of left lower leg with fat layer exposed: Secondary | ICD-10-CM | POA: Diagnosis not present

## 2021-09-10 DIAGNOSIS — I890 Lymphedema, not elsewhere classified: Secondary | ICD-10-CM

## 2021-09-10 DIAGNOSIS — Z9013 Acquired absence of bilateral breasts and nipples: Secondary | ICD-10-CM | POA: Diagnosis not present

## 2021-09-10 DIAGNOSIS — T799XXA Unspecified early complication of trauma, initial encounter: Secondary | ICD-10-CM | POA: Diagnosis not present

## 2021-09-10 DIAGNOSIS — I872 Venous insufficiency (chronic) (peripheral): Secondary | ICD-10-CM | POA: Diagnosis not present

## 2021-09-10 DIAGNOSIS — Z853 Personal history of malignant neoplasm of breast: Secondary | ICD-10-CM | POA: Diagnosis not present

## 2021-09-10 DIAGNOSIS — G8929 Other chronic pain: Secondary | ICD-10-CM | POA: Diagnosis not present

## 2021-09-10 NOTE — Progress Notes (Signed)
[name] ([id number]) Visit Report for 09/10/2021 Chief Complaint Document Details Patient Name: Date of Service: [name], [name]. 09/10/2021 9:00 A M Medical Record Number: [id number] Patient Account Number: [id number] Date of Birth/Sex: Treating RN: [birth year] (76 y.o. [name], [name] Primary Care Provider: [name] Other Clinician: Referring Provider: Treating Provider/Extender: Loreen Freud in Treatment: 5 Information Obtained from: Patient Chief Complaint 08/06/2021; left lower extremity wound following trauma Electronic Signature(s) Signed: 09/10/2021 10:12:59 AM By: Kalman Shan DO Entered By: Kalman Shan on 09/10/2021 10:09:40 -------------------------------------------------------------------------------- Debridement Details Patient Name: Date of Service: Jessica [name], [name]. 09/10/2021 9:00 A M Medical Record Number: [id number] Patient Account Number: [id number] Date of Birth/Sex: Treating RN: 02-04-1946 (76 y.o. [name], [name] Primary Care Provider: [name] Other Clinician: Referring Provider: Treating Provider/Extender: Loreen Freud in Treatment: 5 Debridement Performed for Assessment: Wound #1 Left,Anterior Lower Leg Performed By: Physician Kalman Shan, DO Debridement Type: Debridement Level of Consciousness (Pre-procedure): Awake and Alert Pre-procedure Verification/Time Out Yes - 09:40 Taken: Start Time: 09:41 Pain Control: Lidocaine 5% topical ointment T Area Debrided (L x W): otal 1.9 (cm) x 1.9 (cm) = 3.61 (cm) Tissue and other material debrided: Viable, Non-Viable, Slough, Subcutaneous, Slough Level: Skin/Subcutaneous Tissue Debridement Description: Excisional Instrument: Curette Bleeding: Minimum Hemostasis Achieved: Pressure End Time: 09:45 Procedural Pain: 0 Post Procedural Pain: 0 Response to Treatment: Procedure was tolerated well Level of Consciousness  (Post- Awake and Alert procedure): Post Debridement Measurements of Total Wound Length: (cm) 1.9 Width: (cm) 1.9 Depth: (cm) 0.1 Volume: (cm) 0.284 Character of Wound/Ulcer Post Debridement: Improved Post Procedure Diagnosis Same as Pre-procedure Electronic Signature(s) Signed: 09/10/2021 10:12:59 AM By: Kalman Shan DO Signed: 09/10/2021 4:42:51 PM By: Deon Pilling RN, BSN Entered By: Deon Pilling on 09/10/2021 09:45:48 -------------------------------------------------------------------------------- HPI Details Patient Name: Date of Service: [name], [name]. 09/10/2021 9:00 A M Medical Record Number: [id number] Patient Account Number: [id number] Date of Birth/Sex: Treating RN: 25-May-1945 (76 y.o. [name] Primary Care Provider: [name] Other Clinician: Referring Provider: Treating Provider/Extender: Loreen Freud in Treatment: 5 History of Present Illness HPI Description: Admission/20 08/2021 [name] is a 76 year old female with a past medical history of breast cancer status post bilateral mastectomy that presents the clinic for a 68-monthhistory of nonhealing ulcer to the left lower extremity. On 05/06/2021 while at work she hit her leg against the corner of a desk experiencing a decent laceration. She went to the ED and had sutures placed. These have since been removed. She has been on 2 rounds of antibiotics including Keflex and Bactrim for this issue. She reports chronic pain to the wound site. She has been using Xeroform with dressing changes. She denies purulent drainage. 5/1; patient presents for follow-up. She started using gentamicin ointment and Hydrofera Blue over the past week. She reports improvement in her chronic pain. She has compression stockings but it is unclear if she is wearing these daily. She currently denies systemic signs of infection. She had a PCR culture done at last clinic visit that showed high  levels of Staph aureus. Keystone antibiotics has been ordered and she states she is receiving this soon. 5/9; patient presents for follow-up. She tolerated the compression wrap well. We have been using gentamicin and Hydrofera Blue under the wrap. She is still not obtained her Keystone antibiotics. She states she will try to get this soon. She denies signs  of infection. 5/16; patient presents for follow-up. She states she is obtaining Keystone antibiotics today. She has no issues or complaints today. She tolerated the compression wrap well. We did in office ABIs and they were 0.97 on the left. 5/23; patient presents for follow-up. She has her Keystone antibiotics with her today. She has no issues or complaints. She tolerated the compression wrap well. 5/30; patient presents for follow-up. We have been using Keystone antibiotic with Sorbact under compression wrap. She has no issues or complaints today. She denies signs of infection. Electronic Signature(s) Signed: 09/10/2021 10:12:59 AM By: Kalman Shan DO Entered By: Kalman Shan on 09/10/2021 10:10:13 -------------------------------------------------------------------------------- Physical Exam Details Patient Name: Date of Service: Jessica [name], [name]. 09/10/2021 9:00 A M Medical Record Number: [id number] Patient Account Number: [id number] Date of Birth/Sex: Treating RN: 09/01/1945 (76 y.o. [name] Primary Care Provider: [name] Other Clinician: Referring Provider: Treating Provider/Extender: Loreen Freud in Treatment: 5 Constitutional respirations regular, non-labored and within target range for patient.. Cardiovascular 2+ dorsalis pedis/posterior tibialis pulses. Psychiatric pleasant and cooperative. Notes Left lower extremity: T the anterior aspect there is an open wound with granulation tissue and tightly adhered nonviable tissue. No surrounding signs of o infection. Good edema  control. Electronic Signature(s) Signed: 09/10/2021 10:12:59 AM By: Kalman Shan DO Entered By: Kalman Shan on 09/10/2021 10:10:39 -------------------------------------------------------------------------------- Physician Orders Details Patient Name: Date of Service: [name], [name]. 09/10/2021 9:00 A M Medical Record Number: [id number] Patient Account Number: [id number] Date of Birth/Sex: Treating RN: [birth year] (76 y.o. [name] Primary Care Provider: [name] Other Clinician: Referring Provider: Treating Provider/Extender: Loreen Freud in Treatment: 5 Verbal / Phone Orders: No Diagnosis Coding ICD-10 Coding Code Description [phone] Non-pressure chronic ulcer of other part of left lower leg with fat layer exposed T79.9XXA Unspecified early complication of trauma, initial encounter I10 Essential (primary) hypertension I89.0 Lymphedema, not elsewhere classified I87.2 Venous insufficiency (chronic) (peripheral) C50.919 Malignant neoplasm of unspecified site of unspecified female breast Follow-up Appointments ppointment in 1 week. - (Dr. Dellia Nims covering) Dr. Heber [place] and [place], Room 8 Tuesday 09/17/2021 1230 Return A Dr. Heber [place] and [place], Room 8 Tuesday 09/24/2021 0900 Other: - [employer]- will send out wound care supplies Keystone topical antibiotic bring in at each appt time. Bathing/ Shower/ Hygiene May shower and wash wound with soap and water. Edema Control - Lymphedema / SCD / Other Elevate legs to the level of the heart or above for 30 minutes daily and/or when sitting, a frequency of: - 3-4 times a day throughout the day. Avoid standing for long periods of time. Exercise regularly Moisturize legs daily. - both legs every night before bed. Wound Treatment Wound #1 - Lower Leg Wound Laterality: Left, Anterior Cleanser: Soap and Water 1 x Per Week/30 Days Discharge Instructions: May shower and wash wound with dial  antibacterial soap and water prior to dressing change. Peri-Wound Care: Sween Lotion (Moisturizing lotion) 1 x Per Week/30 Days Discharge Instructions: Apply moisturizing lotion as directed Topical: keystone topical compounding antibiotics 1 x Per Week/30 Days Discharge Instructions: will apply mupirocin in clinic and will use compounding antibiotics once it arrives at next appt time. Prim Dressing: IODOFLEX 0.9% Cadexomer Iodine Pad 4x6 cm 1 x Per Week/30 Days ary Discharge Instructions: Apply to wound bed as instructed Secondary Dressing: Zetuvit Plus 4x8 in 1 x Per Week/30 Days Discharge Instructions: Apply over primary dressing as directed. Compression Wrap: ThreePress (3  layer compression wrap) 1 x Per Week/30 Days Discharge Instructions: Apply three layer compression as directed. Electronic Signature(s) Signed: 09/10/2021 10:12:59 AM By: Kalman Shan DO Entered By: Kalman Shan on 09/10/2021 10:10:55 -------------------------------------------------------------------------------- Problem List Details Patient Name: Date of Service: [name], [name]. 09/10/2021 9:00 A M Medical Record Number: [id number] Patient Account Number: [id number] Date of Birth/Sex: Treating RN: Jan 04, 1946 (76 y.o. [name] Primary Care Provider: [name] Other Clinician: Referring Provider: Treating Provider/Extender: Loreen Freud in Treatment: 5 Active Problems ICD-10 Encounter Code Description Active Date MDM Diagnosis [phone] Non-pressure chronic ulcer of other part of left lower leg with fat layer exposed5/12/2021 No Yes T79.9XXA Unspecified early complication of trauma, initial encounter 08/06/2021 No Yes I10 Essential (primary) hypertension 08/06/2021 No Yes I89.0 Lymphedema, not elsewhere classified 08/06/2021 No Yes I87.2 Venous insufficiency (chronic) (peripheral) 08/06/2021 No Yes C50.919 Malignant neoplasm of unspecified site of unspecified  female breast 08/06/2021 No Yes Inactive Problems Resolved Problems Electronic Signature(s) Signed: 09/10/2021 10:12:59 AM By: Kalman Shan DO Entered By: Kalman Shan on 09/10/2021 10:09:24 -------------------------------------------------------------------------------- Progress Note Details Patient Name: Date of Service: [name], [name]. 09/10/2021 9:00 A M Medical Record Number: [id number] Patient Account Number: [id number] Date of Birth/Sex: Treating RN: [birth year] (76 y.o. [name] Primary Care Provider: Other Clinician: [name] Referring Provider: Treating Provider/Extender: Loreen Freud in Treatment: 5 Subjective Chief Complaint Information obtained from Patient 08/06/2021; left lower extremity wound following trauma History of Present Illness (HPI) Admission/20 08/2021 [name] is a 76 year old female with a past medical history of breast cancer status post bilateral mastectomy that presents the clinic for a 60-monthhistory of nonhealing ulcer to the left lower extremity. On 05/06/2021 while at work she hit her leg against the corner of a desk experiencing a decent laceration. She went to the ED and had sutures placed. These have since been removed. She has been on 2 rounds of antibiotics including Keflex and Bactrim for this issue. She reports chronic pain to the wound site. She has been using Xeroform with dressing changes. She denies purulent drainage. 5/1; patient presents for follow-up. She started using gentamicin ointment and Hydrofera Blue over the past week. She reports improvement in her chronic pain. She has compression stockings but it is unclear if she is wearing these daily. She currently denies systemic signs of infection. She had a PCR culture done at last clinic visit that showed high levels of Staph aureus. Keystone antibiotics has been ordered and she states she is receiving this soon. 5/9; patient  presents for follow-up. She tolerated the compression wrap well. We have been using gentamicin and Hydrofera Blue under the wrap. She is still not obtained her Keystone antibiotics. She states she will try to get this soon. She denies signs of infection. 5/16; patient presents for follow-up. She states she is obtaining Keystone antibiotics today. She has no issues or complaints today. She tolerated the compression wrap well. We did in office ABIs and they were 0.97 on the left. 5/23; patient presents for follow-up. She has her Keystone antibiotics with her today. She has no issues or complaints. She tolerated the compression wrap well. 5/30; patient presents for follow-up. We have been using Keystone antibiotic with Sorbact under compression wrap. She has no issues or complaints today. She denies signs of infection. Patient History Family History Cancer - Father, Diabetes - Father, Heart Disease - Father, Hypertension - Father. Social History Former  smoker - quit 1998, Marital Status - Married, Alcohol Use - Never, Drug Use - No History, Caffeine Use - Never. Medical History Cardiovascular Patient has history of Hypertension Hospitalization/Surgery History - eyes surgery. - tonsillectomy. - hysterectomy. - bilateral mastectomy. - deviated septum from a pear falling from a tree.. Medical A Surgical History Notes nd Constitutional Symptoms (General Health) Bilateral mastectomy Breast Cancer Vitamin D deficiency Cardiovascular Ventricular tachycardia Objective Constitutional respirations regular, non-labored and within target range for patient.. Vitals Time Taken: 9:12 AM, Height: 63 in, Weight: 220 lbs, BMI: 39, Temperature: 98.6 F, Pulse: 94 bpm, Respiratory Rate: 16 breaths/min, Blood Pressure: 140/74 mmHg. Cardiovascular 2+ dorsalis pedis/posterior tibialis pulses. Psychiatric pleasant and cooperative. General Notes: Left lower extremity: T the anterior aspect there is an open  wound with granulation tissue and tightly adhered nonviable tissue. No surrounding o signs of infection. Good edema control. Integumentary (Hair, Skin) Wound #1 status is Open. Original cause of wound was Trauma. The date acquired was: 05/06/2021. The wound has been in treatment 5 weeks. The wound is located on the Left,Anterior Lower Leg. The wound measures 1.9cm length x 1.9cm width x 0.1cm depth; 2.835cm^2 area and 0.284cm^3 volume. There is Fat Layer (Subcutaneous Tissue) exposed. There is no tunneling or undermining noted. There is a medium amount of serosanguineous drainage noted. The wound margin is distinct with the outline attached to the wound base. There is medium (34-66%) red, pink granulation within the wound bed. There is a medium (34-66%) amount of necrotic tissue within the wound bed including Adherent Slough. Assessment Active Problems ICD-10 Non-pressure chronic ulcer of other part of left lower leg with fat layer exposed Unspecified early complication of trauma, initial encounter Essential (primary) hypertension Lymphedema, not elsewhere classified Venous insufficiency (chronic) (peripheral) Malignant neoplasm of unspecified site of unspecified female breast Patient's wound is stable. No signs of surrounding infection. I debrided nonviable tissue. She still has tightly adhered fibrinous tissue and I recommended switching the dressing to Iodoflex with the [facility] antibiotics to help with further debridement. We will continue the compression wrap. Follow-up in 1 week. Procedures Wound #1 Pre-procedure diagnosis of Wound #1 is an Abrasion located on the Left,Anterior Lower Leg . There was a Excisional Skin/Subcutaneous Tissue Debridement with a total area of 3.61 sq cm performed by Kalman Shan, DO. With the following instrument(s): Curette to remove Viable and Non-Viable tissue/material. Material removed includes Subcutaneous Tissue and Slough and after achieving pain  control using Lidocaine 5% topical ointment. A time out was conducted at 09:40, prior to the start of the procedure. A Minimum amount of bleeding was controlled with Pressure. The procedure was tolerated well with a pain level of 0 throughout and a pain level of 0 following the procedure. Post Debridement Measurements: 1.9cm length x 1.9cm width x 0.1cm depth; 0.284cm^3 volume. Character of Wound/Ulcer Post Debridement is improved. Post procedure Diagnosis Wound #1: Same as Pre-Procedure Pre-procedure diagnosis of Wound #1 is an Abrasion located on the Left,Anterior Lower Leg . There was a Three Layer Compression Therapy Procedure by Erenest Blank. Post procedure Diagnosis Wound #1: Same as Pre-Procedure Plan Follow-up Appointments: Return Appointment in 1 week. - (Dr. Dellia Nims covering) Dr. Heber [place] and [place], Room 8 Tuesday 09/17/2021 1230 Dr. Heber [place] and [place], Room 8 Tuesday 09/24/2021 0900 Other: - [employer]- will send out wound care supplies Keystone topical antibiotic bring in at each appt time. Bathing/ Shower/ Hygiene: May shower and wash wound with soap and water. Edema Control - Lymphedema / SCD /  Other: Elevate legs to the level of the heart or above for 30 minutes daily and/or when sitting, a frequency of: - 3-4 times a day throughout the day. Avoid standing for long periods of time. Exercise regularly Moisturize legs daily. - both legs every night before bed. WOUND #1: - Lower Leg Wound Laterality: Left, Anterior Cleanser: Soap and Water 1 x Per Week/30 Days Discharge Instructions: May shower and wash wound with dial antibacterial soap and water prior to dressing change. Peri-Wound Care: Sween Lotion (Moisturizing lotion) 1 x Per Week/30 Days Discharge Instructions: Apply moisturizing lotion as directed Topical: keystone topical compounding antibiotics 1 x Per Week/30 Days Discharge Instructions: will apply mupirocin in clinic and will use compounding antibiotics once it  arrives at next appt time. Prim Dressing: IODOFLEX 0.9% Cadexomer Iodine Pad 4x6 cm 1 x Per Week/30 Days ary Discharge Instructions: Apply to wound bed as instructed Secondary Dressing: Zetuvit Plus 4x8 in 1 x Per Week/30 Days Discharge Instructions: Apply over primary dressing as directed. Com pression Wrap: ThreePress (3 layer compression wrap) 1 x Per Week/30 Days Discharge Instructions: Apply three layer compression as directed. 1. In office sharp debridement 2. Iodoflex 3. Keystone antibiotic 4. 3 layer compression 5. Follow-up in 1 week Electronic Signature(s) Signed: 09/10/2021 10:12:59 AM By: Kalman Shan DO Signed: 09/10/2021 10:12:59 AM By: Kalman Shan DO Entered By: Kalman Shan on 09/10/2021 10:12:07 -------------------------------------------------------------------------------- HxROS Details Patient Name: Date of Service: [name], [name]. 09/10/2021 9:00 A M Medical Record Number: [id number] Patient Account Number: [id number] Date of Birth/Sex: Treating RN: 03-24-1946 (76 y.o. [name], [name] Primary Care Provider: [name] Other Clinician: Referring Provider: Treating Provider/Extender: Loreen Freud in Treatment: 5 Constitutional Symptoms (General Health) Medical History: Past Medical History Notes: Bilateral mastectomy Breast Cancer Vitamin D deficiency Cardiovascular Medical History: Positive for: Hypertension Past Medical History Notes: Ventricular tachycardia Immunizations Pneumococcal Vaccine: Received Pneumococcal Vaccination: No Implantable Devices No devices added Hospitalization / Surgery History Type of Hospitalization/Surgery eyes surgery tonsillectomy hysterectomy bilateral mastectomy deviated septum from a pear falling from a tree. Family and Social History Cancer: Yes - Father; Diabetes: Yes - Father; Heart Disease: Yes - Father; Hypertension: Yes - Father; Former smoker - quit 1998;  Marital Status - Married; Alcohol Use: Never; Drug Use: No History; Caffeine Use: Never; Financial Concerns: No; Food, Clothing or Shelter Needs: No; Support System Lacking: No; Transportation Concerns: No Electronic Signature(s) Signed: 09/10/2021 10:12:59 AM By: Kalman Shan DO Signed: 09/10/2021 4:42:51 PM By: Deon Pilling RN, BSN Entered By: Kalman Shan on 09/10/2021 10:10:19 -------------------------------------------------------------------------------- SuperBill Details Patient Name: Date of Service: Jessica [name], [name]. 09/10/2021 Medical Record Number: [id number] Patient Account Number: [id number] Date of Birth/Sex: Treating RN: June 30, 1945 (76 y.o. [name] Primary Care Provider: [name] Other Clinician: Referring Provider: Treating Provider/Extender: Loreen Freud in Treatment: 5 Diagnosis Coding ICD-10 Codes Code Description [phone] Non-pressure chronic ulcer of other part of left lower leg with fat layer exposed T79.9XXA Unspecified early complication of trauma, initial encounter I10 Essential (primary) hypertension I89.0 Lymphedema, not elsewhere classified I87.2 Venous insufficiency (chronic) (peripheral) C50.919 Malignant neoplasm of unspecified site of unspecified female breast Facility Procedures CPT4 Code: [id number] Description: 73419 - DEB SUBQ TISSUE 20 SQ CM/< ICD-10 Diagnosis Description L97.822 Non-pressure chronic ulcer of other part of left lower leg with fat layer expo Modifier: sed Quantity: 1 Physician Procedures : CPT4 Code Description Modifier [id number] - WC PHYS LEVEL 2 - EST  PT 25 ICD-10 Diagnosis Description L97.822 Non-pressure chronic ulcer of other part of left lower leg with fat layer exposed T79.9XXA Unspecified early complication of trauma, initial  encounter I89.0 Lymphedema, not elsewhere classified I87.2 Venous insufficiency (chronic) (peripheral) Quantity: 1 : [id number] 11042 - WC PHYS  SUBQ TISS 20 SQ CM ICD-10 Diagnosis Description L97.822 Non-pressure chronic ulcer of other part of left lower leg with fat layer exposed Quantity: 1 Electronic Signature(s) Signed: 09/10/2021 12:32:00 PM By: Kalman Shan DO Previous Signature: 09/10/2021 10:12:59 AM Version By: Kalman Shan DO Entered By: Kalman Shan on 09/10/2021 12:29:24

## 2021-09-10 NOTE — Progress Notes (Signed)
MERITA, HAWKS ([id number]) Visit Report for 09/10/2021 Arrival Information Details Patient Name: Date of Service: [name], Jessica [name] 09/10/2021 9:00 A M Medical Record Number: [id number] Patient Account Number: [id number] Date of Birth/Sex: Treating RN: 05-06-1945 (76 y.o. [name], [name] Primary Care [name]: [name] Other Clinician: Referring Ever Gustafson: Treating Luana Tatro/Extender: [name] in Treatment: 5 Visit Information History Since Last Visit Added or deleted any medications: No Patient Arrived: [name] Any new allergies or adverse reactions: No Arrival Time: 09:11 Had a fall or experienced change in No Accompanied By: self activities of daily living that may affect Transfer Assistance: None risk of falls: Patient Identification Verified: Yes Signs or symptoms of abuse/neglect since last visito No Secondary Verification Process Completed: Yes Hospitalized since last visit: No Patient Requires Transmission-Based Precautions: No Implantable device outside of the clinic excluding No Patient Has Alerts: No cellular tissue based products placed in the center since last visit: Has Dressing in Place as Prescribed: Yes Has Compression in Place as Prescribed: Yes Pain Present Now: No Electronic Signature(s) Signed: 09/10/2021 4:42:51 PM By: Jessica Pilling RN, BSN Entered By: [name] on 09/10/2021 09:11:39 -------------------------------------------------------------------------------- Compression Therapy Details Patient Name: Date of Service: CO [name], [name] 09/10/2021 9:00 A M Medical Record Number: [id number] Patient Account Number: [id number] Date of Birth/Sex: Treating RN: 30-Nov-1945 (76 y.o. [name] Primary Care Brevon Dewald: [name] Other Clinician: Referring Tal Kempker: Treating [name]/Extender: [name] in Treatment: 5 Compression Therapy Performed for Wound Assessment: Wound  #1 Left,Anterior Lower Leg Performed By: Clinician Erenest Blank, Compression Type: Three Layer Post Procedure Diagnosis Same as Pre-procedure Electronic Signature(s) Signed: 09/10/2021 4:42:51 PM By: Jessica Pilling RN, BSN Entered By: [name] on 09/10/2021 09:48:40 -------------------------------------------------------------------------------- Encounter Discharge Information Details Patient Name: Date of Service: CO [name], [name] 09/10/2021 9:00 A M Medical Record Number: [id number] Patient Account Number: [id number] Date of Birth/Sex: Treating RN: 07/04/1945 (76 y.o. [name], [name] Primary Care [place]: [name] Other Clinician: Referring Ashliegh Parekh: Treating Jisell Majer/Extender: [name] in Treatment: 5 Encounter Discharge Information Items Post Procedure Vitals Discharge Condition: Stable Temperature (F): 98.6 Ambulatory Status: Cane Pulse (bpm): 94 Discharge Destination: Home Respiratory Rate (breaths/min): 20 Transportation: Private Auto Blood Pressure (mmHg): 140/74 Accompanied By: self Schedule Follow-up Appointment: Yes Clinical Summary of Care: Electronic Signature(s) Signed: 09/10/2021 4:42:51 PM By: Jessica Pilling RN, BSN Entered By: [name] on 09/10/2021 09:49:26 -------------------------------------------------------------------------------- Lower Extremity Assessment Details Patient Name: Date of Service: CO [name], [name] 09/10/2021 9:00 A M Medical Record Number: [id number] Patient Account Number: [id number] Date of Birth/Sex: Treating RN: 05-21-1945 (76 y.o. [name], [name] Primary Care Kiyonna Tortorelli: [name] Other Clinician: Referring Chanz Cahall: Treating Trigo Winterbottom/Extender: [name] in Treatment: 5 Edema Assessment Assessed: [Left: Yes] [Right: No] Edema: [Left: N] [Right: o] Calf Left: Right: Point of Measurement: 30 cm From Medial Instep 42 cm Ankle Left:  Right: Point of Measurement: 9 cm From Medial Instep 26 cm Vascular Assessment Pulses: Dorsalis Pedis Palpable: [Left:Yes] Electronic Signature(s) Signed: 09/10/2021 4:42:51 PM By: Jessica Pilling RN, BSN Entered By: [name] on 09/10/2021 09:18:09 -------------------------------------------------------------------------------- Multi Wound Chart Details Patient Name: Date of Service: CO [name], [name] 09/10/2021 9:00 A M Medical Record Number: [id number] Patient Account Number: [id number] Date of Birth/Sex: Treating RN: 02-27-1946 (76 y.o. [name], [name] Primary Care Kylyn Sookram: [name] Other Clinician: Referring Harla Mensch: Treating Lakeyn Dokken/Extender: Kalman Shan  [name] Weeks in Treatment: 5 Vital Signs Height(in): 63 Pulse(bpm): 94 Weight(lbs): 220 Blood Pressure(mmHg): 140/74 Body Mass Index(BMI): 39 Temperature(F): 98.6 Respiratory Rate(breaths/min): 16 Photos: [N/A:N/A] Left, Anterior Lower Leg N/A N/A Wound Location: Trauma N/A N/A Wounding Event: Abrasion N/A N/A Primary Etiology: Lymphedema N/A N/A Secondary Etiology: Hypertension N/A N/A Comorbid History: 05/06/2021 N/A N/A Date Acquired: 5 N/A N/A Weeks of Treatment: Open N/A N/A Wound Status: No N/A N/A Wound Recurrence: 1.9x1.9x0.1 N/A N/A Measurements L x W x D (cm) 2.835 N/A N/A A (cm) : rea 0.284 N/A N/A Volume (cm) : 14.10% N/A N/A % Reduction in A rea: 13.90% N/A N/A % Reduction in Volume: Full Thickness Without Exposed N/A N/A Classification: Support Structures Medium N/A N/A Exudate A mount: Serosanguineous N/A N/A Exudate Type: red, brown N/A N/A Exudate Color: Distinct, outline attached N/A N/A Wound Margin: Medium (34-66%) N/A N/A Granulation A mount: Red, Pink N/A N/A Granulation Quality: Medium (34-66%) N/A N/A Necrotic A mount: Fat Layer (Subcutaneous Tissue): Yes N/A N/A Exposed Structures: Fascia: No Tendon: No Muscle: No Joint:  No Bone: No Large (67-100%) N/A N/A Epithelialization: Debridement - Excisional N/A N/A Debridement: Pre-procedure Verification/Time Out 09:40 N/A N/A Taken: Lidocaine 5% topical ointment N/A N/A Pain Control: Subcutaneous, Slough N/A N/A Tissue Debrided: Skin/Subcutaneous Tissue N/A N/A Level: 3.61 N/A N/A Debridement A (sq cm): rea Curette N/A N/A Instrument: Minimum N/A N/A Bleeding: Pressure N/A N/A Hemostasis A chieved: 0 N/A N/A Procedural Pain: 0 N/A N/A Post Procedural Pain: Procedure was tolerated well N/A N/A Debridement Treatment Response: 1.9x1.9x0.1 N/A N/A Post Debridement Measurements L x W x D (cm) 0.284 N/A N/A Post Debridement Volume: (cm) Compression Therapy N/A N/A Procedures Performed: Debridement Treatment Notes Wound #1 (Lower Leg) Wound Laterality: Left, Anterior Cleanser Soap and Water Discharge Instruction: May shower and wash wound with dial antibacterial soap and water prior to dressing change. Peri-Wound Care Sween Lotion (Moisturizing lotion) Discharge Instruction: Apply moisturizing lotion as directed Topical keystone topical compounding antibiotics Discharge Instruction: will apply mupirocin in clinic and will use compounding antibiotics once it arrives at next appt time. Primary Dressing IODOFLEX 0.9% Cadexomer Iodine Pad 4x6 cm Discharge Instruction: Apply to wound bed as instructed Secondary Dressing Zetuvit Plus 4x8 in Discharge Instruction: Apply over primary dressing as directed. Secured With Compression Wrap ThreePress (3 layer compression wrap) Discharge Instruction: Apply three layer compression as directed. Compression Stockings Add-Ons Electronic Signature(s) Signed: 09/10/2021 10:12:59 AM By: Kalman Shan DO Signed: 09/10/2021 4:42:51 PM By: Jessica Pilling RN, BSN Entered By: Kalman Shan on 09/10/2021  10:09:32 -------------------------------------------------------------------------------- Multi-Disciplinary Care Plan Details Patient Name: Date of Service: CO [name], [name] 09/10/2021 9:00 A M Medical Record Number: [id number] Patient Account Number: [id number] Date of Birth/Sex: Treating RN: [birth year] (76 y.o. [name], [name] Primary Care Onita Pfluger: [name] Other Clinician: Referring Parthiv Mucci: Treating Cheick Suhr/Extender: [name] in Treatment: 5 Active Inactive Pain, Acute or Chronic Nursing Diagnoses: Pain, acute or chronic: actual or potential Potential alteration in comfort, pain Goals: Patient will verbalize adequate pain control and receive pain control interventions during procedures as needed Date Initiated: 08/06/2021 Target Resolution Date: 10/04/2021 Goal Status: Active Patient/caregiver will verbalize comfort level met Date Initiated: 08/06/2021 Target Resolution Date: 10/10/2021 Goal Status: Active Interventions: Encourage patient to take pain medications as prescribed Provide education on pain management Reposition patient for comfort Treatment Activities: Administer pain control measures as ordered : 08/06/2021 Notes: Wound/Skin Impairment Nursing Diagnoses: Knowledge deficit related to ulceration/compromised skin  integrity Goals: Patient/caregiver will verbalize understanding of skin care regimen Date Initiated: 08/06/2021 Target Resolution Date: 10/10/2021 Goal Status: Active Interventions: Assess patient/caregiver ability to obtain necessary supplies Assess patient/caregiver ability to perform ulcer/skin care regimen upon admission and as needed Provide education on ulcer and skin care Treatment Activities: Skin care regimen initiated : 08/06/2021 Topical wound management initiated : 08/06/2021 Notes: Electronic Signature(s) Signed: 09/10/2021 4:42:51 PM By: Jessica Pilling RN, BSN Entered By: [name] on  09/10/2021 09:20:31 -------------------------------------------------------------------------------- Pain Assessment Details Patient Name: Date of Service: CO [name], [name] 09/10/2021 9:00 A M Medical Record Number: [id number] Patient Account Number: [id number] Date of Birth/Sex: Treating RN: 08-03-1945 (76 y.o. [name] Primary Care Mariangela Heldt: [name] Other Clinician: Referring Jemmie Rhinehart: Treating Deneisha Dade/Extender: [name] in Treatment: 5 Active Problems Location of Pain Severity and Description of Pain Patient Has Paino No Site Locations Rate the pain. Current Pain Level: 0 Pain Management and Medication Current Pain Management: Medication: No Cold Application: No Rest: No Massage: No Activity: No T.E.N.S.: No Heat Application: No Leg drop or elevation: No Is the Current Pain Management Adequate: Adequate How does your wound impact your activities of daily livingo Sleep: No Bathing: No Appetite: No Relationship With Others: No Bladder Continence: No Emotions: No Bowel Continence: No Work: No Toileting: No Drive: No Dressing: No Hobbies: No Engineer, maintenance) Signed: 09/10/2021 4:42:51 PM By: Jessica Pilling RN, BSN Entered By: [name] on 09/10/2021 09:12:36 -------------------------------------------------------------------------------- Patient/Caregiver Education Details Patient Name: Date of Service: CO [name], [name] 5/30/2023andnbsp9:00 A M Medical Record Number: [id number] Patient Account Number: [id number] Date of Birth/Gender: Treating RN: [birth year] (76 y.o. [name], [name] Primary Care Physician: [name] Other Clinician: Referring Physician: Treating Physician/Extender: [name] in Treatment: 5 Education Assessment Education Provided To: Patient Education Topics Provided Wound/Skin Impairment: Handouts: Skin Care Do's and Dont's Methods:  Explain/Verbal Responses: Reinforcements needed Electronic Signature(s) Signed: 09/10/2021 4:42:51 PM By: Jessica Pilling RN, BSN Entered By: [name] on 09/10/2021 09:21:43 -------------------------------------------------------------------------------- Wound Assessment Details Patient Name: Date of Service: CO [name], [name] 09/10/2021 9:00 A M Medical Record Number: [id number] Patient Account Number: [id number] Date of Birth/Sex: Treating RN: January 28, 1946 (76 y.o. [name], [name] Primary Care Dylan Ruotolo: [name] Other Clinician: Referring Jameela Michna: Treating Reedy Biernat/Extender: [name] in Treatment: 5 Wound Status Wound Number: 1 Primary Etiology: Abrasion Wound Location: Left, Anterior Lower Leg Secondary Etiology: Lymphedema Wounding Event: Trauma Wound Status: Open Date Acquired: 05/06/2021 Comorbid History: Hypertension Weeks Of Treatment: 5 Clustered Wound: No Photos Wound Measurements Length: (cm) 1.9 Width: (cm) 1.9 Depth: (cm) 0.1 Area: (cm) 2.835 Volume: (cm) 0.284 % Reduction in Area: 14.1% % Reduction in Volume: 13.9% Epithelialization: Large (67-100%) Tunneling: No Undermining: No Wound Description Classification: Full Thickness Without Exposed Support Structures Wound Margin: Distinct, outline attached Exudate Amount: Medium Exudate Type: Serosanguineous Exudate Color: red, brown Foul Odor After Cleansing: No Slough/Fibrino Yes Wound Bed Granulation Amount: Medium (34-66%) Exposed Structure Granulation Quality: Red, Pink Fascia Exposed: No Necrotic Amount: Medium (34-66%) Fat Layer (Subcutaneous Tissue) Exposed: Yes Necrotic Quality: Adherent Slough Tendon Exposed: No Muscle Exposed: No Joint Exposed: No Bone Exposed: No Treatment Notes Wound #1 (Lower Leg) Wound Laterality: Left, Anterior Cleanser Soap and Water Discharge Instruction: May shower and wash wound with dial antibacterial soap and  water prior to dressing change. Peri-Wound Care Sween Lotion (Moisturizing lotion) Discharge Instruction: Apply moisturizing lotion as directed Topical keystone  topical compounding antibiotics Discharge Instruction: will apply mupirocin in clinic and will use compounding antibiotics once it arrives at next appt time. Primary Dressing IODOFLEX 0.9% Cadexomer Iodine Pad 4x6 cm Discharge Instruction: Apply to wound bed as instructed Secondary Dressing Zetuvit Plus 4x8 in Discharge Instruction: Apply over primary dressing as directed. Secured With Compression Wrap ThreePress (3 layer compression wrap) Discharge Instruction: Apply three layer compression as directed. Compression Stockings Add-Ons Electronic Signature(s) Signed: 09/10/2021 4:42:51 PM By: Jessica Pilling RN, BSN Entered By: [name] on 09/10/2021 09:19:48 -------------------------------------------------------------------------------- Vitals Details Patient Name: Date of Service: CO [name], [name] 09/10/2021 9:00 A M Medical Record Number: [id number] Patient Account Number: [id number] Date of Birth/Sex: Treating RN: 1946-04-12 (76 y.o. [name], [name] Primary Care Charley Lafrance: [name] Other Clinician: Referring Juanell Saffo: Treating Chloee Tena/Extender: [name] in Treatment: 5 Vital Signs Time Taken: 09:12 Temperature (F): 98.6 Height (in): 63 Pulse (bpm): 94 Weight (lbs): 220 Respiratory Rate (breaths/min): 16 Body Mass Index (BMI): 39 Blood Pressure (mmHg): 140/74 Reference Range: 80 - 120 mg / dl Electronic Signature(s) Signed: 09/10/2021 4:42:51 PM By: Jessica Pilling RN, BSN Entered By: [name] on 09/10/2021 09:12:46

## 2021-09-17 ENCOUNTER — Encounter (HOSPITAL_BASED_OUTPATIENT_CLINIC_OR_DEPARTMENT_OTHER): Payer: No Typology Code available for payment source | Attending: Internal Medicine | Admitting: Internal Medicine

## 2021-09-17 DIAGNOSIS — I872 Venous insufficiency (chronic) (peripheral): Secondary | ICD-10-CM | POA: Diagnosis not present

## 2021-09-17 DIAGNOSIS — T798XXA Other early complications of trauma, initial encounter: Secondary | ICD-10-CM | POA: Insufficient documentation

## 2021-09-17 DIAGNOSIS — L97822 Non-pressure chronic ulcer of other part of left lower leg with fat layer exposed: Secondary | ICD-10-CM | POA: Diagnosis not present

## 2021-09-17 DIAGNOSIS — Z853 Personal history of malignant neoplasm of breast: Secondary | ICD-10-CM | POA: Insufficient documentation

## 2021-09-17 DIAGNOSIS — Z9013 Acquired absence of bilateral breasts and nipples: Secondary | ICD-10-CM | POA: Insufficient documentation

## 2021-09-17 DIAGNOSIS — X58XXXA Exposure to other specified factors, initial encounter: Secondary | ICD-10-CM | POA: Diagnosis not present

## 2021-09-17 DIAGNOSIS — I890 Lymphedema, not elsewhere classified: Secondary | ICD-10-CM | POA: Insufficient documentation

## 2021-09-17 DIAGNOSIS — I10 Essential (primary) hypertension: Secondary | ICD-10-CM | POA: Diagnosis not present

## 2021-09-17 NOTE — Progress Notes (Signed)
[name] ([id number]) Visit Report for 09/17/2021 Debridement Details Patient Name: Date of Service: [name], [place] [name] 09/17/2021 12:30 PM Medical Record Number: [id number] Patient Account Number: [id number] Date of Birth/Sex: Treating RN: 1945/08/16 (76 y.o. F) Primary Care Provider: [name] Other Clinician: Referring Provider: Treating Provider/Extender: Aldine Contes in Treatment: 6 Debridement Performed for Assessment: Wound #1 Left,Anterior Lower Leg Performed By: Physician Ricard Dillon., MD Debridement Type: Debridement Level of Consciousness (Pre-procedure): Awake and Alert Pre-procedure Verification/Time Out Yes - 12:45 Taken: Start Time: 12:46 Pain Control: Lidocaine 5% topical ointment T Area Debrided (L x W): otal 2 (cm) x 2 (cm) = 4 (cm) Tissue and other material debrided: Viable, Non-Viable, Slough, Subcutaneous, Skin: Dermis , Skin: Epidermis, Fibrin/Exudate, Slough Level: Skin/Subcutaneous Tissue Debridement Description: Excisional Instrument: Curette Bleeding: Minimum Hemostasis Achieved: Pressure End Time: 12:49 Procedural Pain: 0 Post Procedural Pain: 0 Response to Treatment: Procedure was tolerated well Level of Consciousness (Post- Awake and Alert procedure): Post Debridement Measurements of Total Wound Length: (cm) 2 Width: (cm) 2 Depth: (cm) 0.1 Volume: (cm) 0.314 Character of Wound/Ulcer Post Debridement: Improved Post Procedure Diagnosis Same as Pre-procedure Electronic Signature(s) Signed: 09/17/2021 5:45:31 PM By: Linton Ham MD Entered By: Linton Ham on 09/17/2021 12:52:08 -------------------------------------------------------------------------------- HPI Details Patient Name: Date of Service: [place] [name], [name] 09/17/2021 12:30 PM Medical Record Number: [id number] Patient Account Number: [id number] Date of Birth/Sex: Treating RN: June 10, 1945 (76 y.o. F) Primary Care Provider: [name] Other Clinician: Referring Provider: Treating Provider/Extender: Aldine Contes in Treatment: 6 History of Present Illness HPI Description: Admission/20 08/2021 [name] is a 76 year old female with a past medical history of breast cancer status post bilateral mastectomy that presents the clinic for a 67-monthhistory of nonhealing ulcer to the left lower extremity. On 05/06/2021 while at work she hit her leg against the corner of a desk experiencing a decent laceration. She went to the ED and had sutures placed. These have since been removed. She has been on 2 rounds of antibiotics including Keflex and Bactrim for this issue. She reports chronic pain to the wound site. She has been using Xeroform with dressing changes. She denies purulent drainage. 5/1; patient presents for follow-up. She started using gentamicin ointment and Hydrofera Blue over the past week. She reports improvement in her chronic pain. She has compression stockings but it is unclear if she is wearing these daily. She currently denies systemic signs of infection. She had a PCR culture done at last clinic visit that showed high levels of Staph aureus. Keystone antibiotics has been ordered and she states she is receiving this soon. 5/9; patient presents for follow-up. She tolerated the compression wrap well. We have been using gentamicin and Hydrofera Blue under the wrap. She is still not obtained her Keystone antibiotics. She states she will try to get this soon. She denies signs of infection. 5/16; patient presents for follow-up. She states she is obtaining Keystone antibiotics today. She has no issues or complaints today. She tolerated the compression wrap well. We did in office ABIs and they were 0.97 on the left. 5/23; patient presents for follow-up. She has her Keystone antibiotics with her today. She has no issues or complaints. She tolerated the compression wrap well. 5/30; patient presents  for follow-up. We have been using Keystone antibiotic with Sorbact under compression wrap. She has no issues or complaints today. She denies signs of infection. 6/6; Keystone with Iodoflex  as of last week. Per our intake nurse everything looks a little better she is using 3 layer compression Electronic Signature(s) Signed: 09/17/2021 5:45:31 PM By: Linton Ham MD Entered By: Linton Ham on 09/17/2021 12:52:40 -------------------------------------------------------------------------------- Physical Exam Details Patient Name: Date of Service: CO [name], [name] 09/17/2021 12:30 PM Medical Record Number: [id number] Patient Account Number: [id number] Date of Birth/Sex: Treating RN: 30-Aug-1945 (76 y.o. F) Primary Care Provider: [name] Other Clinician: Referring Provider: Treating Provider/Extender: Aldine Contes in Treatment: 6 Constitutional Sitting or standing Blood Pressure is within target range for patient.. Pulse regular and within target range for patient.Marland Kitchen Respirations regular, non-labored and within target range.. Temperature is normal and within the target range for the patient.Marland Kitchen Appears in no distress. Notes Wound exam; left anterior lower extremity. Probably some degree of chronic venous insufficiency here not perfect edema control. I used a #3 curette to debride the subcutaneous tissue postdebridement this really cleans up quite nicely healthy looking clinic granulation. Mild to moderate bleeding controlled with direct pressure. No evidence of surrounding infection Electronic Signature(s) Signed: 09/17/2021 5:45:31 PM By: Linton Ham MD Entered By: Linton Ham on 09/17/2021 12:53:40 -------------------------------------------------------------------------------- Physician Orders Details Patient Name: Date of Service: CO [name], [name] 09/17/2021 12:30 PM Medical Record Number: [id number] Patient Account Number: [id number] Date of  Birth/Sex: Treating RN: [birth year] (76 y.o. [name] Primary Care Provider: [name] Other Clinician: Referring Provider: Treating Provider/Extender: Aldine Contes in Treatment: 6 Verbal / Phone Orders: No Diagnosis Coding ICD-10 Coding Code Description [phone] Non-pressure chronic ulcer of other part of left lower leg with fat layer exposed T79.9XXA Unspecified early complication of trauma, initial encounter I10 Essential (primary) hypertension I89.0 Lymphedema, not elsewhere classified I87.2 Venous insufficiency (chronic) (peripheral) C50.919 Malignant neoplasm of unspecified site of unspecified female breast Follow-up Appointments ppointment in 1 week. - Dr. Heber [place], Room 8 Tuesday 09/24/2021 0900 Return A Dr. Heber [place] and [place], Room 8 Tuesday 10/01/2021 1115 Other: - [employer]- will send out wound care supplies [place] topical antibiotic bring in at each appt time. Bathing/ Shower/ Hygiene May shower and wash wound with soap and water. Edema Control - Lymphedema / SCD / Other Elevate legs to the level of the heart or above for 30 minutes daily and/or when sitting, a frequency of: - 3-4 times a day throughout the day. Avoid standing for long periods of time. Exercise regularly Moisturize legs daily. - both legs every night before bed. Wound Treatment Wound #1 - Lower Leg Wound Laterality: Left, Anterior Cleanser: Soap and Water 1 x Per Week/30 Days Discharge Instructions: May shower and wash wound with dial antibacterial soap and water prior to dressing change. Peri-Wound Care: Triamcinolone 15 (g) 1 x Per Week/30 Days Discharge Instructions: Use triamcinolone 15 (g) as directed Peri-Wound Care: Sween Lotion (Moisturizing lotion) 1 x Per Week/30 Days Discharge Instructions: Apply moisturizing lotion as directed Topical: keystone topical compounding antibiotics 1 x Per Week/30 Days Discharge Instructions: will apply  mupirocin in clinic and will use compounding antibiotics once it arrives at next appt time. Prim Dressing: IODOFLEX 0.9% Cadexomer Iodine Pad 4x6 cm 1 x Per Week/30 Days ary Discharge Instructions: Apply to wound bed as instructed Secondary Dressing: Zetuvit Plus 4x8 in 1 x Per Week/30 Days Discharge Instructions: Apply over primary dressing as directed. Compression Wrap: FourPress (4 layer compression wrap) 1 x Per Week/30 Days Discharge Instructions: Apply four layer compression as directed. May also  use Miliken CoFlex 2 layer compression system as alternative. Electronic Signature(s) Signed: 09/17/2021 5:45:31 PM By: Linton Ham MD Signed: 09/17/2021 6:06:53 PM By: Deon Pilling RN, BSN Entered By: Deon Pilling on 09/17/2021 12:52:56 -------------------------------------------------------------------------------- Problem List Details Patient Name: Date of Service: CO [name], [name] 09/17/2021 12:30 PM Medical Record Number: [id number] Patient Account Number: [id number] Date of Birth/Sex: Treating RN: [birth year] (76 y.o. F) Primary Care Provider: [name] Other Clinician: Referring Provider: Treating Provider/Extender: Aldine Contes in Treatment: 6 Active Problems ICD-10 Encounter Code Description Active Date MDM Diagnosis [phone] Non-pressure chronic ulcer of other part of left lower leg with fat layer exposed5/12/2021 No Yes T79.9XXA Unspecified early complication of trauma, initial encounter 08/06/2021 No Yes I10 Essential (primary) hypertension 08/06/2021 No Yes I89.0 Lymphedema, not elsewhere classified 08/06/2021 No Yes I87.2 Venous insufficiency (chronic) (peripheral) 08/06/2021 No Yes C50.919 Malignant neoplasm of unspecified site of unspecified female breast 08/06/2021 No Yes Inactive Problems Resolved Problems Electronic Signature(s) Signed: 09/17/2021 5:45:31 PM By: Linton Ham MD Entered By: Linton Ham on 09/17/2021  12:51:55 -------------------------------------------------------------------------------- Progress Note Details Patient Name: Date of Service: CO [name], [name] 09/17/2021 12:30 PM Medical Record Number: [id number] Patient Account Number: [id number] Date of Birth/Sex: Treating RN: 06/28/1945 (76 y.o. F) Primary Care Provider: [name] Other Clinician: Referring Provider: Treating Provider/Extender: Aldine Contes in Treatment: 6 Subjective History of Present Illness (HPI) Admission/20 08/2021 [name] is a 76 year old female with a past medical history of breast cancer status post bilateral mastectomy that presents the clinic for a 46-monthhistory of nonhealing ulcer to the left lower extremity. On 05/06/2021 while at work she hit her leg against the corner of a desk experiencing a decent laceration. She went to the ED and had sutures placed. These have since been removed. She has been on 2 rounds of antibiotics including Keflex and Bactrim for this issue. She reports chronic pain to the wound site. She has been using Xeroform with dressing changes. She denies purulent drainage. 5/1; patient presents for follow-up. She started using gentamicin ointment and Hydrofera Blue over the past week. She reports improvement in her chronic pain. She has compression stockings but it is unclear if she is wearing these daily. She currently denies systemic signs of infection. She had a PCR culture done at last clinic visit that showed high levels of Staph aureus. Keystone antibiotics has been ordered and she states she is receiving this soon. 5/9; patient presents for follow-up. She tolerated the compression wrap well. We have been using gentamicin and Hydrofera Blue under the wrap. She is still not obtained her Keystone antibiotics. She states she will try to get this soon. She denies signs of infection. 5/16; patient presents for follow-up. She states she is obtaining  Keystone antibiotics today. She has no issues or complaints today. She tolerated the compression wrap well. We did in office ABIs and they were 0.97 on the left. 5/23; patient presents for follow-up. She has her Keystone antibiotics with her today. She has no issues or complaints. She tolerated the compression wrap well. 5/30; patient presents for follow-up. We have been using Keystone antibiotic with Sorbact under compression wrap. She has no issues or complaints today. She denies signs of infection. 6/6; Keystone with Iodoflex as of last week. Per our intake nurse everything looks a little better she is using 3 layer compression Objective Constitutional Sitting or standing Blood Pressure is within target range for patient.. Pulse  regular and within target range for patient.Marland Kitchen Respirations regular, non-labored and within target range.. Temperature is normal and within the target range for the patient.Marland Kitchen Appears in no distress. Vitals Time Taken: 12:35 PM, Height: 63 in, Weight: 220 lbs, BMI: 39, Temperature: 98.6 F, Pulse: 84 bpm, Respiratory Rate: 16 breaths/min, Blood Pressure: 139/84 mmHg. General Notes: Wound exam; left anterior lower extremity. Probably some degree of chronic venous insufficiency here not perfect edema control. I used a #3 curette to debride the subcutaneous tissue postdebridement this really cleans up quite nicely healthy looking clinic granulation. Mild to moderate bleeding controlled with direct pressure. No evidence of surrounding infection Integumentary (Hair, Skin) Wound #1 status is Open. Original cause of wound was Trauma. The date acquired was: 05/06/2021. The wound has been in treatment 6 weeks. The wound is located on the Left,Anterior Lower Leg. The wound measures 2cm length x 2cm width x 0.1cm depth; 3.142cm^2 area and 0.314cm^3 volume. There is Fat Layer (Subcutaneous Tissue) exposed. There is no tunneling or undermining noted. There is a medium amount of  serosanguineous drainage noted. The wound margin is distinct with the outline attached to the wound base. There is medium (34-66%) red, pink granulation within the wound bed. There is a medium (34-66%) amount of necrotic tissue within the wound bed including Adherent Slough. Assessment Active Problems ICD-10 Non-pressure chronic ulcer of other part of left lower leg with fat layer exposed Unspecified early complication of trauma, initial encounter Essential (primary) hypertension Lymphedema, not elsewhere classified Venous insufficiency (chronic) (peripheral) Malignant neoplasm of unspecified site of unspecified female breast Procedures Wound #1 Pre-procedure diagnosis of Wound #1 is an Abrasion located on the Left,Anterior Lower Leg . There was a Excisional Skin/Subcutaneous Tissue Debridement with a total area of 4 sq cm performed by Ricard Dillon., MD. With the following instrument(s): Curette to remove Viable and Non-Viable tissue/material. Material removed includes Subcutaneous Tissue, Slough, Skin: Dermis, Skin: Epidermis, and Fibrin/Exudate after achieving pain control using Lidocaine 5% topical ointment. A time out was conducted at 12:45, prior to the start of the procedure. A Minimum amount of bleeding was controlled with Pressure. The procedure was tolerated well with a pain level of 0 throughout and a pain level of 0 following the procedure. Post Debridement Measurements: 2cm length x 2cm width x 0.1cm depth; 0.314cm^3 volume. Character of Wound/Ulcer Post Debridement is improved. Post procedure Diagnosis Wound #1: Same as Pre-Procedure Pre-procedure diagnosis of Wound #1 is an Abrasion located on the Left,Anterior Lower Leg . There was a Four Layer Compression Therapy Procedure by Deon Pilling, RN. Post procedure Diagnosis Wound #1: Same as Pre-Procedure Plan Follow-up Appointments: Return Appointment in 1 week. - Dr. Heber [place] and [place], Room 8 Tuesday 09/24/2021 0900 Dr.  Heber [place] and [place], Room 8 Tuesday 10/01/2021 1115 Other: [employer]- will send out wound care supplies Key[place] topical antibiotic bring in at each appt time. Bathing/ Shower/ Hygiene: May shower and wash wound with soap and water. Edema Control - Lymphedema / SCD / Other: Elevate legs to the level of the heart or above for 30 minutes daily and/or when sitting, a frequency of: - 3-4 times a day throughout the day. Avoid standing for long periods of time. Exercise regularly Moisturize legs daily. - both legs every night before bed. WOUND #1: - Lower Leg Wound Laterality: Left, Anterior Cleanser: Soap and Water 1 x Per Week/30 Days Discharge Instructions: May shower and wash wound with dial antibacterial soap and water prior to dressing change. Peri-Wound  Care: Triamcinolone 15 (g) 1 x Per Week/30 Days Discharge Instructions: Use triamcinolone 15 (g) as directed Peri-Wound Care: Sween Lotion (Moisturizing lotion) 1 x Per Week/30 Days Discharge Instructions: Apply moisturizing lotion as directed Topical: keystone topical compounding antibiotics 1 x Per Week/30 Days Discharge Instructions: will apply mupirocin in clinic and will use compounding antibiotics once it arrives at next appt time. Prim Dressing: IODOFLEX 0.9% Cadexomer Iodine Pad 4x6 cm 1 x Per Week/30 Days ary Discharge Instructions: Apply to wound bed as instructed Secondary Dressing: Zetuvit Plus 4x8 in 1 x Per Week/30 Days Discharge Instructions: Apply over primary dressing as directed. Com pression Wrap: FourPress (4 layer compression wrap) 1 x Per Week/30 Days Discharge Instructions: Apply four layer compression as directed. May also use [place] CoFlex 2 layer compression system as alternative. 1. I continued with the [facility] and Iodoflex which really seems to have done a nice job since last week. 2. Mechanical debridement today due to a nonviable surface on the wound this cleaned up surprisingly well. 3. She did not  have edema control therefore I have increased her to 4-layer compression. She does not have an arterial issue Electronic Signature(s) Signed: 09/17/2021 5:45:31 PM By: Linton Ham MD Entered By: Linton Ham on 09/17/2021 12:54:27 -------------------------------------------------------------------------------- SuperBill Details Patient Name: Date of Service: CO [name], [name] 09/17/2021 Medical Record Number: [id number] Patient Account Number: [id number] Date of Birth/Sex: Treating RN: 1946/03/28 (76 y.o. [name] Primary Care Provider: [name] Other Clinician: Referring Provider: Treating Provider/Extender: Aldine Contes in Treatment: 6 Diagnosis Coding ICD-10 Codes Code Description [phone] Non-pressure chronic ulcer of other part of left lower leg with fat layer exposed T79.9XXA Unspecified early complication of trauma, initial encounter I10 Essential (primary) hypertension I89.0 Lymphedema, not elsewhere classified I87.2 Venous insufficiency (chronic) (peripheral) C50.919 Malignant neoplasm of unspecified site of unspecified female breast Facility Procedures CPT4 Code: [id number] Description: 40814 - DEB SUBQ TISSUE 20 SQ CM/< ICD-10 Diagnosis Description L97.822 Non-pressure chronic ulcer of other part of left lower leg with fat layer expo Modifier: sed Quantity: 1 Physician Procedures : CPT4 Code Description Modifier [id number] - WC PHYS SUBQ TISS 20 SQ CM ICD-10 Diagnosis Description L97.822 Non-pressure chronic ulcer of other part of left lower leg with fat layer exposed Quantity: 1 Electronic Signature(s) Signed: 09/17/2021 5:45:31 PM By: Linton Ham MD Signed: 09/17/2021 5:45:31 PM By: Linton Ham MD Entered By: Linton Ham on 09/17/2021 12:54:34

## 2021-09-24 ENCOUNTER — Encounter (HOSPITAL_BASED_OUTPATIENT_CLINIC_OR_DEPARTMENT_OTHER): Payer: No Typology Code available for payment source | Admitting: Internal Medicine

## 2021-09-24 DIAGNOSIS — I10 Essential (primary) hypertension: Secondary | ICD-10-CM | POA: Diagnosis not present

## 2021-09-24 DIAGNOSIS — Z9013 Acquired absence of bilateral breasts and nipples: Secondary | ICD-10-CM | POA: Diagnosis not present

## 2021-09-24 DIAGNOSIS — T798XXA Other early complications of trauma, initial encounter: Secondary | ICD-10-CM | POA: Diagnosis not present

## 2021-09-24 DIAGNOSIS — I872 Venous insufficiency (chronic) (peripheral): Secondary | ICD-10-CM | POA: Diagnosis not present

## 2021-09-24 DIAGNOSIS — I890 Lymphedema, not elsewhere classified: Secondary | ICD-10-CM | POA: Diagnosis not present

## 2021-09-24 DIAGNOSIS — L97822 Non-pressure chronic ulcer of other part of left lower leg with fat layer exposed: Secondary | ICD-10-CM

## 2021-09-24 DIAGNOSIS — Z853 Personal history of malignant neoplasm of breast: Secondary | ICD-10-CM | POA: Diagnosis not present

## 2021-09-24 NOTE — Progress Notes (Signed)
[name], [name] ([id number]) Visit Report for 09/17/2021 Arrival Information Details Patient Name: Date of Service: [place], [place] [name] 09/17/2021 12:30 PM Medical Record Number: [id number] Patient Account Number: [id number] Date of Birth/Sex: Treating RN: [birth year] (76 y.o. F) Primary Care Kwana Ringel: [name] Other Clinician: Referring Chenee Munns: Treating Kieryn Burtis/Extender: Aldine Contes in Treatment: 6 Visit Information History Since Last Visit Added or deleted any medications: No Patient Arrived: [name] Any new allergies or adverse reactions: No Arrival Time: 12:34 Had a fall or experienced change in No Accompanied By: self activities of daily living that may affect Transfer Assistance: None risk of falls: Patient Identification Verified: Yes Signs or symptoms of abuse/neglect since last visito No Secondary Verification Process Completed: Yes Hospitalized since last visit: No Patient Requires Transmission-Based Precautions: No Implantable device outside of the clinic excluding No Patient Has Alerts: No cellular tissue based products placed in the center since last visit: Has Dressing in Place as Prescribed: Yes Has Compression in Place as Prescribed: Yes Pain Present Now: No Electronic Signature(s) Signed: 09/24/2021 8:46:10 AM By: [name] Entered By: [name] on 09/17/2021 12:34:49 -------------------------------------------------------------------------------- Compression Therapy Details Patient Name: Date of Service: [name], Jessica [name] 09/17/2021 12:30 PM Medical Record Number: [id number] Patient Account Number: [id number] Date of Birth/Sex: Treating RN: [birth year] (76 y.o. [name] Primary Care Nathalya Wolanski: [name] Other Clinician: Referring Navil Kole: Treating [name]/Extender: Aldine Contes in Treatment: 6 Compression Therapy Performed for Wound Assessment: Wound #1 Left,Anterior Lower  Leg Performed By: Clinician Jessica Pilling, RN Compression Type: Four Layer Post Procedure Diagnosis Same as Pre-procedure Electronic Signature(s) Signed: 09/17/2021 6:06:53 PM By: Jessica Pilling RN, BSN Entered By: [name] on 09/17/2021 12:48:17 -------------------------------------------------------------------------------- Encounter Discharge Information Details Patient Name: Date of Service: [name], Jessica [name] 09/17/2021 12:30 PM Medical Record Number: [id number] Patient Account Number: [id number] Date of Birth/Sex: Treating RN: 06-Aug-1945 (76 y.o. [name] Primary Care Darianne Muralles: [name] Other Clinician: Referring Adalie Mand: Treating [name]/Extender: Aldine Contes in Treatment: 6 Encounter Discharge Information Items Post Procedure Vitals Discharge Condition: Stable Temperature (F): 98.6 Ambulatory Status: Cane Pulse (bpm): 84 Discharge Destination: Home Respiratory Rate (breaths/min): 20 Transportation: Private Auto Blood Pressure (mmHg): 139/84 Accompanied By: self Schedule Follow-up Appointment: Yes Clinical Summary of Care: Electronic Signature(s) Signed: 09/17/2021 6:06:53 PM By: Jessica Pilling RN, BSN Entered By: [name] on 09/17/2021 12:53:45 -------------------------------------------------------------------------------- Lower Extremity Assessment Details Patient Name: Date of Service: Jessica [name], Jessica [name] 09/17/2021 12:30 PM Medical Record Number: [id number] Patient Account Number: [id number] Date of Birth/Sex: Treating RN: April 25, 1945 (76 y.o. F) Primary Care Erie Radu: [name] Other Clinician: Referring Bluford Sedler: Treating Hashir Deleeuw/Extender: Aldine Contes in Treatment: 6 Edema Assessment Assessed: [name]: Yes] [Right: No] Edema: [Left: N] [Right: o] Calf Left: Right: Point of Measurement: 30 cm From Medial Instep 45 cm Ankle Left: Right: Point of Measurement: 9 cm From  Medial Instep 25 cm Vascular Assessment Pulses: Dorsalis Pedis Palpable: [Left:Yes] Electronic Signature(s) Signed: 09/24/2021 8:46:10 AM By: [name] Entered By: [name] on 09/17/2021 12:40:01 -------------------------------------------------------------------------------- Multi Wound Chart Details Patient Name: Date of Service: [name], Jessica [name] 09/17/2021 12:30 PM Medical Record Number: [id number] Patient Account Number: [id number] Date of Birth/Sex: Treating RN: 1945/10/24 (76 y.o. F) Primary Care Andry Bogden: [name] Other Clinician: Referring Billiejo Sorto: Treating Cosimo Schertzer/Extender: Aldine Contes in Treatment: 6 Vital Signs Height(in): 63 Pulse(bpm): 84 Weight(lbs):  220 Blood Pressure(mmHg): 139/84 Body Mass Index(BMI): 39 Temperature(F): 98.6 Respiratory Rate(breaths/min): 16 Photos: [N/A:N/A] Left, Anterior Lower Leg N/A N/A Wound Location: Trauma N/A N/A Wounding Event: Abrasion N/A N/A Primary Etiology: Lymphedema N/A N/A Secondary Etiology: Hypertension N/A N/A Comorbid History: 05/06/2021 N/A N/A Date Acquired: 6 N/A N/A Weeks of Treatment: Open N/A N/A Wound Status: No N/A N/A Wound Recurrence: 2x2x0.1 N/A N/A Measurements L x W x D (cm) 3.142 N/A N/A A (cm) : rea 0.314 N/A N/A Volume (cm) : 4.80% N/A N/A % Reduction in A rea: 4.80% N/A N/A % Reduction in Volume: Full Thickness Without Exposed N/A N/A Classification: Support Structures Medium N/A N/A Exudate A mount: Serosanguineous N/A N/A Exudate Type: red, brown N/A N/A Exudate Color: Distinct, outline attached N/A N/A Wound Margin: Medium (34-66%) N/A N/A Granulation A mount: Red, Pink N/A N/A Granulation Quality: Medium (34-66%) N/A N/A Necrotic A mount: Fat Layer (Subcutaneous Tissue): Yes N/A N/A Exposed Structures: Fascia: No Tendon: No Muscle: No Joint: No Bone: No Medium (34-66%) N/A N/A Epithelialization: Debridement  - Excisional N/A N/A Debridement: Pre-procedure Verification/Time Out 12:45 N/A N/A Taken: Lidocaine 5% topical ointment N/A N/A Pain Control: Subcutaneous, Slough N/A N/A Tissue Debrided: Skin/Subcutaneous Tissue N/A N/A Level: 4 N/A N/A Debridement A (sq cm): rea Curette N/A N/A Instrument: Minimum N/A N/A Bleeding: Pressure N/A N/A Hemostasis A chieved: 0 N/A N/A Procedural Pain: 0 N/A N/A Post Procedural Pain: Procedure was tolerated well N/A N/A Debridement Treatment Response: 2x2x0.1 N/A N/A Post Debridement Measurements L x W x D (cm) 0.314 N/A N/A Post Debridement Volume: (cm) Compression Therapy N/A N/A Procedures Performed: Debridement Treatment Notes Electronic Signature(s) Signed: 09/17/2021 5:45:31 PM By: Jessica Ham MD Entered By: [name] on 09/17/2021 12:52:01 -------------------------------------------------------------------------------- Multi-Disciplinary Care Plan Details Patient Name: Date of Service: [name], Jessica [name] 09/17/2021 12:30 PM Medical Record Number: [id number] Patient Account Number: [id number] Date of Birth/Sex: Treating RN: 1945/07/12 (76 y.o. F) Primary Care Joenathan Sakuma: [name] Other Clinician: Referring Sadey Yandell: Treating [name]/Extender: Aldine Contes in Treatment: 6 Active Inactive Pain, Acute or Chronic Nursing Diagnoses: Pain, acute or chronic: actual or potential Potential alteration in comfort, pain Goals: Patient will verbalize adequate pain control and receive pain control interventions during procedures as needed Date Initiated: 08/06/2021 Target Resolution Date: 10/04/2021 Goal Status: Active Patient/caregiver will verbalize comfort level met Date Initiated: 08/06/2021 Target Resolution Date: 10/10/2021 Goal Status: Active Interventions: Encourage patient to take pain medications as prescribed Provide education on pain management Reposition patient for  comfort Treatment Activities: Administer pain control measures as ordered : 08/06/2021 Notes: Wound/Skin Impairment Nursing Diagnoses: Knowledge deficit related to ulceration/compromised skin integrity Goals: Patient/caregiver will verbalize understanding of skin care regimen Date Initiated: 08/06/2021 Target Resolution Date: 10/10/2021 Goal Status: Active Interventions: Assess patient/caregiver ability to obtain necessary supplies Assess patient/caregiver ability to perform ulcer/skin care regimen upon admission and as needed Provide education on ulcer and skin care Treatment Activities: Skin care regimen initiated : 08/06/2021 Topical wound management initiated : 08/06/2021 Notes: Electronic Signature(s) Signed: 09/17/2021 6:06:53 PM By: Jessica Pilling RN, BSN Entered By: [name] on 09/17/2021 12:44:17 -------------------------------------------------------------------------------- Pain Assessment Details Patient Name: Date of Service: [name], Jessica [name] 09/17/2021 12:30 PM Medical Record Number: [id number] Patient Account Number: [id number] Date of Birth/Sex: Treating RN: 1946-03-28 (76 y.o. F) Primary Care Laronda Lisby: [name] Other Clinician: Referring Jordynn Marcella: Treating [name]/Extender: Aldine Contes in Treatment: 6 Active Problems Location of Pain Severity and  Description of Pain Patient Has Paino No Site Locations Rate the pain. Current Pain Level: 0 Pain Management and Medication Current Pain Management: Medication: No Cold Application: No Rest: No Massage: No Activity: No T.E.N.S.: No Heat Application: No Leg drop or elevation: No Is the Current Pain Management Adequate: Adequate How does your wound impact your activities of daily livingo Sleep: No Bathing: No Appetite: No Relationship With Others: No Bladder Continence: No Emotions: No Bowel Continence: No Work: No Toileting: No Drive: No Dressing: No Hobbies:  No Electronic Signature(s) Signed: 09/24/2021 8:46:10 AM By: [name] Entered By: [name] on 09/17/2021 12:35:00 -------------------------------------------------------------------------------- Patient/Caregiver Education Details Patient Name: Date of Service: [name], Jessica [name] 6/6/2023andnbsp12:30 PM Medical Record Number: [id number] Patient Account Number: [id number] Date of Birth/Gender: Treating RN: [birth year] (76 y.o. F) Primary Care Physician: [name] Other Clinician: Referring Physician: Treating Physician/Extender: Aldine Contes in Treatment: 6 Education Assessment Education Provided To: Patient Education Topics Provided Pain: Handouts: A Guide to Pain Control Methods: Explain/Verbal Responses: Reinforcements needed Electronic Signature(s) Signed: 09/17/2021 6:06:53 PM By: Jessica Pilling RN, BSN Entered By: [name] on 09/17/2021 12:44:22 -------------------------------------------------------------------------------- Wound Assessment Details Patient Name: Date of Service: [name], Jessica [name] 09/17/2021 12:30 PM Medical Record Number: [id number] Patient Account Number: [id number] Date of Birth/Sex: Treating RN: 1945/05/22 (76 y.o. [name], [name] Primary Care Barbi Kumagai: [name] Other Clinician: Referring Blue Winther: Treating [name]/Extender: Aldine Contes in Treatment: 6 Wound Status Wound Number: 1 Primary Etiology: Abrasion Wound Location: Left, Anterior Lower Leg Secondary Etiology: Lymphedema Wounding Event: Trauma Wound Status: Open Date Acquired: 05/06/2021 Comorbid History: Hypertension Weeks Of Treatment: 6 Clustered Wound: No Photos Wound Measurements Length: (cm) 2 Width: (cm) 2 Depth: (cm) 0.1 Area: (cm) 3.142 Volume: (cm) 0.314 % Reduction in Area: 4.8% % Reduction in Volume: 4.8% Epithelialization: Medium (34-66%) Tunneling: No Undermining: No Wound  Description Classification: Full Thickness Without Exposed Support Structu Wound Margin: Distinct, outline attached Exudate Amount: Medium Exudate Type: Serosanguineous Exudate Color: red, brown res Foul Odor After Cleansing: No Slough/Fibrino Yes Wound Bed Granulation Amount: Medium (34-66%) Exposed Structure Granulation Quality: Red, Pink Fascia Exposed: No Necrotic Amount: Medium (34-66%) Fat Layer (Subcutaneous Tissue) Exposed: Yes Necrotic Quality: Adherent Slough Tendon Exposed: No Muscle Exposed: No Joint Exposed: No Bone Exposed: No Treatment Notes Wound #1 (Lower Leg) Wound Laterality: Left, Anterior Cleanser Soap and Water Discharge Instruction: May shower and wash wound with dial antibacterial soap and water prior to dressing change. Peri-Wound Care Triamcinolone 15 (g) Discharge Instruction: Use triamcinolone 15 (g) as directed Sween Lotion (Moisturizing lotion) Discharge Instruction: Apply moisturizing lotion as directed Topical keystone topical compounding antibiotics Discharge Instruction: will apply mupirocin in clinic and will use compounding antibiotics once it arrives at next appt time. Primary Dressing IODOFLEX 0.9% Cadexomer Iodine Pad 4x6 cm Discharge Instruction: Apply to wound bed as instructed Secondary Dressing Zetuvit Plus 4x8 in Discharge Instruction: Apply over primary dressing as directed. Secured With Compression Wrap FourPress (4 layer compression wrap) Discharge Instruction: Apply four layer compression as directed. May also use Miliken CoFlex 2 layer compression system as alternative. Compression Stockings Add-Ons Electronic Signature(s) Signed: 09/17/2021 6:06:53 PM By: Jessica Pilling RN, BSN Entered By: [name] on 09/17/2021 12:42:24 -------------------------------------------------------------------------------- Vitals Details Patient Name: Date of Service: Jessica [name], Jessica [name] 09/17/2021 12:30 PM Medical Record Number:  [id number] Patient Account Number: [id number] Date of Birth/Sex: Treating RN: 12-Jan-1946 (76 y.o. F) Primary Care Tylasia Fletchall: [name]  Other Clinician: Referring Harry Bark: Treating [name]/Extender: Aldine Contes in Treatment: 6 Vital Signs Time Taken: 12:35 Temperature (F): 98.6 Height (in): 63 Pulse (bpm): 84 Weight (lbs): 220 Respiratory Rate (breaths/min): 16 Body Mass Index (BMI): 39 Blood Pressure (mmHg): 139/84 Reference Range: 80 - 120 mg / dl Electronic Signature(s) Signed: 09/24/2021 8:46:10 AM By: [name] Entered By: [name] on 09/17/2021 12:36:56

## 2021-09-24 NOTE — Progress Notes (Signed)
EVALYSE, STROOPE ([id number]) Visit Report for 08/27/2021 Arrival Information Details Patient Name: Date of Service: [place], [place] [name] 08/27/2021 3:15 PM Medical Record Number: [id number] Patient Account Number: [id number] Date of Birth/Sex: Treating RN: 31-Jan-1946 (76 y.o. [name], [name] Primary Care Ariyonna Twichell: [name] Other Clinician: Referring Aravind Chrismer: Treating Oluwaferanmi Wain/Extender: Loreen Freud in Treatment: 3 Visit Information History Since Last Visit Added or deleted any medications: No Patient Arrived: [name] Any new allergies or adverse reactions: No Arrival Time: 15:22 Had a fall or experienced change in No Accompanied By: self activities of daily living that may affect Transfer Assistance: None risk of falls: Patient Identification Verified: Yes Signs or symptoms of abuse/neglect since last visito No Secondary Verification Process Completed: Yes Hospitalized since last visit: No Patient Requires Transmission-Based Precautions: No Implantable device outside of the clinic excluding No Patient Has Alerts: No cellular tissue based products placed in the center since last visit: Has Dressing in Place as Prescribed: Yes Has Compression in Place as Prescribed: Yes Pain Present Now: No Electronic Signature(s) Signed: 09/24/2021 8:46:54 AM By: Erenest Blank Entered By: Erenest Blank on 08/27/2021 15:23:09 -------------------------------------------------------------------------------- Encounter Discharge Information Details Patient Name: Date of Service: [name], [name] 08/27/2021 3:15 PM Medical Record Number: [id number] Patient Account Number: [id number] Date of Birth/Sex: Treating RN: Sep 12, 1945 (76 y.o. [name], [name] Primary Care Nimisha Rathel: [name] Other Clinician: Referring Alexiz Cothran: Treating Katalin Colledge/Extender: Loreen Freud in Treatment: 3 Encounter Discharge Information Items Post Procedure  Vitals Discharge Condition: Stable Temperature (F): 97.8 Ambulatory Status: Cane Pulse (bpm): 87 Discharge Destination: Home Respiratory Rate (breaths/min): 18 Transportation: Private Auto Blood Pressure (mmHg): 145/93 Accompanied By: self Schedule Follow-up Appointment: Yes Clinical Summary of Care: Electronic Signature(s) Signed: 09/24/2021 8:46:54 AM By: Erenest Blank Entered By: Erenest Blank on 08/27/2021 16:58:45 -------------------------------------------------------------------------------- Lower Extremity Assessment Details Patient Name: Date of Service: [name], [name] 08/27/2021 3:15 PM Medical Record Number: [id number] Patient Account Number: [id number] Date of Birth/Sex: Treating RN: [birth year] (76 y.o. [name], [name] Primary Care Jonice Cerra: [name] Other Clinician: Referring Narmeen Kerper: Treating Nikkolas Coomes/Extender: Loreen Freud in Treatment: 3 Edema Assessment Assessed: [Left: No] [Right: No] Edema: [Left: Ye] [Right: s] Calf Left: Right: Point of Measurement: 30 cm From Medial Instep 46.7 cm Ankle Left: Right: Point of Measurement: 9 cm From Medial Instep 28.7 cm Vascular Assessment Pulses: Dorsalis Pedis Palpable: [Left:Yes] Blood Pressure: Brachial: [Left:145] Ankle: [Left:Dorsalis Pedis: 140 0.97] Electronic Signature(s) Signed: 08/27/2021 4:59:59 PM By: Deon Pilling RN, BSN Entered By: Deon Pilling on 08/27/2021 16:04:51 -------------------------------------------------------------------------------- Multi Wound Chart Details Patient Name: Date of Service: [name], [name] 08/27/2021 3:15 PM Medical Record Number: [id number] Patient Account Number: [id number] Date of Birth/Sex: Treating RN: 01-21-1946 (76 y.o. [name], [name] Primary Care Neomia Herbel: [name] Other Clinician: Referring Renell Coaxum: Treating Bibi Economos/Extender: Loreen Freud in Treatment: 3 Vital  Signs Height(in): 63 Pulse(bpm): 87 Weight(lbs): 220 Blood Pressure(mmHg): 145/93 Body Mass Index(BMI): 39 Temperature(F): 97.8 Respiratory Rate(breaths/min): 18 Photos: [1:Left, Anterior Lower Leg] [N/A:N/A N/A] Wound Location: [1:Trauma] [N/A:N/A] Wounding Event: [1:Abrasion] [N/A:N/A] Primary Etiology: [1:Lymphedema] [N/A:N/A] Secondary Etiology: [1:Hypertension] [N/A:N/A] Comorbid History: [1:05/06/2021] [N/A:N/A] Date Acquired: [1:3] [N/A:N/A] Weeks of Treatment: [1:Open] [N/A:N/A] Wound Status: [1:No] [N/A:N/A] Wound Recurrence: [1:2.2x2.3x0.1] [N/A:N/A] Measurements L x W x D (cm) [1:3.974] [N/A:N/A] A (cm) : rea [1:0.397] [N/A:N/A] Volume (cm) : [1:-20.50%] [N/A:N/A] % Reduction in A rea: [1:-20.30%] [N/A:N/A] % Reduction in  Volume: [1:Full Thickness Without Exposed] [N/A:N/A] Classification: [1:Support Structures Medium] [N/A:N/A] Exudate A mount: [1:Serosanguineous] [N/A:N/A] Exudate Type: [1:red, brown] [N/A:N/A] Exudate Color: [1:Distinct, outline attached] [N/A:N/A] Wound Margin: [1:Large (67-100%)] [N/A:N/A] Granulation A mount: [1:Red, Pink, Hyper-granulation] [N/A:N/A] Granulation Quality: [1:Small (1-33%)] [N/A:N/A] Necrotic A mount: [1:Fat Layer (Subcutaneous Tissue): Yes N/A] Exposed Structures: [1:Fascia: No Tendon: No Muscle: No Joint: No Bone: No Small (1-33%)] [N/A:N/A] Epithelialization: [1:Debridement - Excisional] [N/A:N/A] Debridement: Pre-procedure Verification/Time Out 15:50 [N/A:N/A] Taken: [1:Lidocaine 5% topical ointment] [N/A:N/A] Pain Control: [1:Subcutaneous, Slough] [N/A:N/A] Tissue Debrided: [1:Skin/Subcutaneous Tissue] [N/A:N/A] Level: [1:5.06] [N/A:N/A] Debridement A (sq cm): [1:rea Curette] [N/A:N/A] Instrument: [1:Minimum] [N/A:N/A] Bleeding: [1:Pressure] [N/A:N/A] Hemostasis A chieved: [1:0] [N/A:N/A] Procedural Pain: [1:0] [N/A:N/A] Post Procedural Pain: [1:Procedure was tolerated well] [N/A:N/A] Debridement Treatment  Response: [1:2.2x2.3x0.1] [N/A:N/A] Post Debridement Measurements L x W x D (cm) [1:0.397] [N/A:N/A] Post Debridement Volume: (cm) [1:Debridement] [N/A:N/A] Treatment Notes Electronic Signature(s) Signed: 08/27/2021 4:36:48 PM By: Kalman Shan DO Signed: 08/27/2021 4:59:59 PM By: Deon Pilling RN, BSN Entered By: Kalman Shan on 08/27/2021 16:31:51 -------------------------------------------------------------------------------- Multi-Disciplinary Care Plan Details Patient Name: Date of Service: [name], [name] 08/27/2021 3:15 PM Medical Record Number: [id number] Patient Account Number: [id number] Date of Birth/Sex: Treating RN: 08-Sep-1945 (76 y.o. [name], [name] Primary Care Niyati Heinke: [name] Other Clinician: Referring Tobin Witucki: Treating Ishitha Roper/Extender: Loreen Freud in Treatment: 3 Active Inactive Pain, Acute or Chronic Nursing Diagnoses: Pain, acute or chronic: actual or potential Potential alteration in comfort, pain Goals: Patient will verbalize adequate pain control and receive pain control interventions during procedures as needed Date Initiated: 08/06/2021 Target Resolution Date: 10/04/2021 Goal Status: Active Patient/caregiver will verbalize comfort level met Date Initiated: 08/06/2021 Target Resolution Date: 10/10/2021 Goal Status: Active Interventions: Encourage patient to take pain medications as prescribed Provide education on pain management Reposition patient for comfort Treatment Activities: Administer pain control measures as ordered : 08/06/2021 Notes: Wound/Skin Impairment Nursing Diagnoses: Knowledge deficit related to ulceration/compromised skin integrity Goals: Patient/caregiver will verbalize understanding of skin care regimen Date Initiated: 08/06/2021 Target Resolution Date: 10/10/2021 Goal Status: Active Interventions: Assess patient/caregiver ability to obtain necessary supplies Assess  patient/caregiver ability to perform ulcer/skin care regimen upon admission and as needed Provide education on ulcer and skin care Treatment Activities: Skin care regimen initiated : 08/06/2021 Topical wound management initiated : 08/06/2021 Notes: Electronic Signature(s) Signed: 08/27/2021 4:59:59 PM By: Deon Pilling RN, BSN Entered By: Deon Pilling on 08/27/2021 15:34:08 -------------------------------------------------------------------------------- Pain Assessment Details Patient Name: Date of Service: [name], [name] 08/27/2021 3:15 PM Medical Record Number: [id number] Patient Account Number: [id number] Date of Birth/Sex: Treating RN: 10/12/1945 (76 y.o. [name], [name] Primary Care Nicklas Mcsweeney: [name] Other Clinician: Referring Britany Callicott: Treating Nathaniel Yaden/Extender: Loreen Freud in Treatment: 3 Active Problems Location of Pain Severity and Description of Pain Patient Has Paino No Site Locations Pain Management and Medication Current Pain Management: Electronic Signature(s) Signed: 08/27/2021 4:59:59 PM By: Deon Pilling RN, BSN Signed: 09/24/2021 8:46:54 AM By: Erenest Blank Entered By: Erenest Blank on 08/27/2021 15:26:57 -------------------------------------------------------------------------------- Patient/Caregiver Education Details Patient Name: Date of Service: CO Bobbye [place] 5/16/2023andnbsp3:15 PM Medical Record Number: [id number] Patient Account Number: [id number] Date of Birth/Gender: Treating RN: 05-04-1945 (76 y.o. [name] Primary Care Physician: [name] Other Clinician: Referring Physician: Treating Physician/Extender: Loreen Freud in Treatment: 3 Education Assessment Education Provided To: Patient Education Topics Provided Wound/Skin Impairment: Handouts: Skin Care Do's and Dont's Methods: Explain/Verbal  Responses: Reinforcements needed Electronic  Signature(s) Signed: 08/27/2021 4:59:59 PM By: Deon Pilling RN, BSN Entered By: Deon Pilling on 08/27/2021 15:34:20 -------------------------------------------------------------------------------- Wound Assessment Details Patient Name: Date of Service: [name], [name] 08/27/2021 3:15 PM Medical Record Number: [id number] Patient Account Number: [id number] Date of Birth/Sex: Treating RN: May 07, 1945 (76 y.o. [name], [name] Primary Care Tequan Redmon: [name] Other Clinician: Referring Kaipo Ardis: Treating Ezariah Nace/Extender: Loreen Freud in Treatment: 3 Wound Status Wound Number: 1 Primary Etiology: Abrasion Wound Location: Left, Anterior Lower Leg Secondary Etiology: Lymphedema Wounding Event: Trauma Wound Status: Open Date Acquired: 05/06/2021 Comorbid History: Hypertension Weeks Of Treatment: 3 Clustered Wound: No Photos Wound Measurements Length: (cm) 2.2 Width: (cm) 2.3 Depth: (cm) 0.1 Area: (cm) 3.974 Volume: (cm) 0.397 % Reduction in Area: -20.5% % Reduction in Volume: -20.3% Epithelialization: Small (1-33%) Tunneling: No Undermining: No Wound Description Classification: Full Thickness Without Exposed Support Structures Wound Margin: Distinct, outline attached Exudate Amount: Medium Exudate Type: Serosanguineous Exudate Color: red, brown Foul Odor After Cleansing: No Slough/Fibrino Yes Wound Bed Granulation Amount: Large (67-100%) Exposed Structure Granulation Quality: Red, Pink, Hyper-granulation Fascia Exposed: No Necrotic Amount: Small (1-33%) Fat Layer (Subcutaneous Tissue) Exposed: Yes Necrotic Quality: Adherent Slough Tendon Exposed: No Muscle Exposed: No Joint Exposed: No Bone Exposed: No Electronic Signature(s) Signed: 08/27/2021 4:59:59 PM By: Deon Pilling RN, BSN Signed: 09/24/2021 8:46:54 AM By: Erenest Blank Entered By: Erenest Blank on 08/27/2021  15:42:32 -------------------------------------------------------------------------------- Vitals Details Patient Name: Date of Service: [name], [name] 08/27/2021 3:15 PM Medical Record Number: [id number] Patient Account Number: [id number] Date of Birth/Sex: Treating RN: November 24, 1945 (76 y.o. [name], [name] Primary Care Dempsey Ahonen: [name] Other Clinician: Referring Marne Meline: Treating Demetrius Barrell/Extender: Loreen Freud in Treatment: 3 Vital Signs Time Taken: 15:26 Temperature (F): 97.8 Height (in): 63 Pulse (bpm): 87 Weight (lbs): 220 Respiratory Rate (breaths/min): 18 Body Mass Index (BMI): 39 Blood Pressure (mmHg): 145/93 Reference Range: 80 - 120 mg / dl Electronic Signature(s) Signed: 09/24/2021 8:46:54 AM By: Erenest Blank Entered By: Erenest Blank on 08/27/2021 15:26:39

## 2021-09-24 NOTE — Progress Notes (Signed)
[name], [name] ([id number]) Visit Report for 09/24/2021 Chief Complaint Document Details Patient Name: Date of Service: [name], [place] [name] 09/24/2021 9:00 A M Medical Record Number: [id number] Patient Account Number: [id number] Date of Birth/Sex: Treating RN: June 07, 1945 (76 y.o. [name], [name] Primary Care Provider: [name] Other Clinician: Referring Provider: Treating Provider/Extender: Loreen Freud in Treatment: 7 Information Obtained from: Patient Chief Complaint 08/06/2021; left lower extremity wound following trauma Electronic Signature(s) Signed: 09/24/2021 10:43:02 AM By: Kalman Shan DO Entered By: Kalman Shan on 09/24/2021 10:14:36 -------------------------------------------------------------------------------- Debridement Details Patient Name: Date of Service: CO [name], Jessica [name] 09/24/2021 9:00 A M Medical Record Number: [id number] Patient Account Number: [id number] Date of Birth/Sex: Treating RN: Apr 07, 1946 (76 y.o. [name], [name] Primary Care Provider: [name] Other Clinician: Referring Provider: Treating Provider/Extender: Loreen Freud in Treatment: 7 Debridement Performed for Assessment: Wound #1 Left,Anterior Lower Leg Performed By: Physician Kalman Shan, DO Debridement Type: Debridement Level of Consciousness (Pre-procedure): Awake and Alert Pre-procedure Verification/Time Out Yes - 09:30 Taken: Start Time: 09:31 Pain Control: Lidocaine 5% topical ointment T Area Debrided (L x W): otal 2 (cm) x 2 (cm) = 4 (cm) Tissue and other material debrided: Viable, Non-Viable, Slough, Subcutaneous, Skin: Dermis , Skin: Epidermis, Fibrin/Exudate, Slough Level: Skin/Subcutaneous Tissue Debridement Description: Excisional Instrument: Curette Bleeding: Minimum Hemostasis Achieved: Pressure End Time: 09:35 Procedural Pain: 0 Post Procedural Pain: 0 Response to Treatment: Procedure was  tolerated well Level of Consciousness (Post- Awake and Alert procedure): Post Debridement Measurements of Total Wound Length: (cm) 2 Width: (cm) 2 Depth: (cm) 0.1 Volume: (cm) 0.314 Character of Wound/Ulcer Post Debridement: Improved Post Procedure Diagnosis Same as Pre-procedure Electronic Signature(s) Signed: 09/24/2021 10:43:02 AM By: Kalman Shan DO Signed: 09/24/2021 5:07:46 PM By: Jessica Pilling RN, BSN Entered By: [name] on 09/24/2021 09:35:59 -------------------------------------------------------------------------------- HPI Details Patient Name: Date of Service: [name], Jessica [name] 09/24/2021 9:00 A M Medical Record Number: [id number] Patient Account Number: [id number] Date of Birth/Sex: Treating RN: 01-Apr-1946 (76 y.o. [name] Primary Care Provider: [name] Other Clinician: Referring Provider: Treating Provider/Extender: Loreen Freud in Treatment: 7 History of Present Illness HPI Description: Admission/20 08/2021 [name] is a 76 year old female with a past medical history of breast cancer status post bilateral mastectomy that presents the clinic for a 46-monthhistory of nonhealing ulcer to the left lower extremity. On 05/06/2021 while at work she hit her leg against the corner of a desk experiencing a decent laceration. She went to the ED and had sutures placed. These have since been removed. She has been on 2 rounds of antibiotics including Keflex and Bactrim for this issue. She reports chronic pain to the wound site. She has been using Xeroform with dressing changes. She denies purulent drainage. 5/1; patient presents for follow-up. She started using gentamicin ointment and Hydrofera Blue over the past week. She reports improvement in her chronic pain. She has compression stockings but it is unclear if she is wearing these daily. She currently denies systemic signs of infection. She had a PCR culture done at last  clinic visit that showed high levels of Staph aureus. Keystone antibiotics has been ordered and she states she is receiving this soon. 5/9; patient presents for follow-up. She tolerated the compression wrap well. We have been using gentamicin and Hydrofera Blue under the wrap. She is still not obtained her Keystone antibiotics. She states she will try to  get this soon. She denies signs of infection. 5/16; patient presents for follow-up. She states she is obtaining Keystone antibiotics today. She has no issues or complaints today. She tolerated the compression wrap well. We did in office ABIs and they were 0.97 on the left. 5/23; patient presents for follow-up. She has her Keystone antibiotics with her today. She has no issues or complaints. She tolerated the compression wrap well. 5/30; patient presents for follow-up. We have been using Keystone antibiotic with Sorbact under compression wrap. She has no issues or complaints today. She denies signs of infection. 6/6; Keystone with Iodoflex as of last week. Per our intake nurse everything looks a little better she is using 3 layer compression 6/13; patient presents for follow-up. She has been using Keystone with Iodoflex under compression. She was increased to 4 layer at last clinic visit. Electronic Signature(s) Signed: 09/24/2021 10:43:02 AM By: Kalman Shan DO Entered By: Kalman Shan on 09/24/2021 10:39:14 -------------------------------------------------------------------------------- Physical Exam Details Patient Name: Date of Service: CO [name], Jessica [name] 09/24/2021 9:00 A M Medical Record Number: [id number] Patient Account Number: [id number] Date of Birth/Sex: Treating RN: 24-Feb-1946 (76 y.o. [name] Primary Care Provider: [name] Other Clinician: Referring Provider: Treating Provider/Extender: Loreen Freud in Treatment: 7 Constitutional respirations regular, non-labored and within  target range for patient.. Cardiovascular 2+ dorsalis pedis/posterior tibialis pulses. Psychiatric pleasant and cooperative. Notes Left lower extremity: T the anterior aspect there is an open wound with granulation tissue and slough. No surrounding signs of infection. Good edema control. o Electronic Signature(s) Signed: 09/24/2021 10:43:02 AM By: Kalman Shan DO Entered By: Kalman Shan on 09/24/2021 10:40:56 -------------------------------------------------------------------------------- Physician Orders Details Patient Name: Date of Service: [name], Jessica [name] 09/24/2021 9:00 A M Medical Record Number: [id number] Patient Account Number: [id number] Date of Birth/Sex: Treating RN: [birth year] (76 y.o. [name] Primary Care Provider: [name] Other Clinician: Referring Provider: Treating Provider/Extender: Loreen Freud in Treatment: 7 Verbal / Phone Orders: No Diagnosis Coding ICD-10 Coding Code Description [phone] Non-pressure chronic ulcer of other part of left lower leg with fat layer exposed T79.9XXA Unspecified early complication of trauma, initial encounter I10 Essential (primary) hypertension I89.0 Lymphedema, not elsewhere classified I87.2 Venous insufficiency (chronic) (peripheral) C50.919 Malignant neoplasm of unspecified site of unspecified female breast Follow-up Appointments ppointment in 1 week. - Dr. Heber [place] and [place], Room 8 Tuesday 10/01/2021 1115 Return A Dr. Heber [place] and [place], Room 8 Tuesday 10/08/2021 0900 Other: - [employer]- will send out wound care supplies Keystone topical antibiotic bring in at each appt time. Bathing/ Shower/ Hygiene May shower and wash wound with soap and water. Edema Control - Lymphedema / SCD / Other Elevate legs to the level of the heart or above for 30 minutes daily and/or when sitting, a frequency of: - 3-4 times a day throughout the day. Avoid standing for long periods of  time. Exercise regularly Moisturize legs daily. - both legs every night before bed. Wound Treatment Wound #1 - Lower Leg Wound Laterality: Left, Anterior Cleanser: Soap and Water 1 x Per Week/30 Days Discharge Instructions: May shower and wash wound with dial antibacterial soap and water prior to dressing change. Peri-Wound Care: Triamcinolone 15 (g) 1 x Per Week/30 Days Discharge Instructions: Use triamcinolone 15 (g) as directed Peri-Wound Care: Sween Lotion (Moisturizing lotion) 1 x Per Week/30 Days Discharge Instructions: Apply moisturizing lotion as directed Topical: keystone topical compounding antibiotics 1 x Per Week/30 Days  Discharge Instructions: will apply mupirocin in clinic and will use compounding antibiotics once it arrives at next appt time. Prim Dressing: IODOFLEX 0.9% Cadexomer Iodine Pad 4x6 cm ary 1 x Per Week/30 Days Discharge Instructions: Apply to wound bed as instructed Secondary Dressing: Zetuvit Plus 4x8 in 1 x Per Week/30 Days Discharge Instructions: Apply over primary dressing as directed. Compression Wrap: FourPress (4 layer compression wrap) 1 x Per Week/30 Days Discharge Instructions: Apply four layer compression as directed. May also use Miliken CoFlex 2 layer compression system as alternative. Electronic Signature(s) Signed: 09/24/2021 10:43:02 AM By: Kalman Shan DO Entered By: Kalman Shan on 09/24/2021 10:41:03 -------------------------------------------------------------------------------- Problem List Details Patient Name: Date of Service: [name], Jessica [name] 09/24/2021 9:00 A M Medical Record Number: [id number] Patient Account Number: [id number] Date of Birth/Sex: Treating RN: 1946/03/17 (76 y.o. [name] Primary Care Provider: [name] Other Clinician: Referring Provider: Treating Provider/Extender: Loreen Freud in Treatment: 7 Active Problems ICD-10 Encounter Code Description Active Date  MDM Diagnosis [phone] Non-pressure chronic ulcer of other part of left lower leg with fat layer exposed5/12/2021 No Yes T79.9XXA Unspecified early complication of trauma, initial encounter 08/06/2021 No Yes I10 Essential (primary) hypertension 08/06/2021 No Yes I89.0 Lymphedema, not elsewhere classified 08/06/2021 No Yes I87.2 Venous insufficiency (chronic) (peripheral) 08/06/2021 No Yes C50.919 Malignant neoplasm of unspecified site of unspecified female breast 08/06/2021 No Yes Inactive Problems Resolved Problems Electronic Signature(s) Signed: 09/24/2021 10:43:02 AM By: Kalman Shan DO Entered By: Kalman Shan on 09/24/2021 10:14:23 -------------------------------------------------------------------------------- Progress Note Details Patient Name: Date of Service: [name], Jessica [name] 09/24/2021 9:00 A M Medical Record Number: [id number] Patient Account Number: [id number] Date of Birth/Sex: Treating RN: 1946-02-11 (76 y.o. [name] Primary Care Provider: [name] Other Clinician: Referring Provider: Treating Provider/Extender: Loreen Freud in Treatment: 7 Subjective Chief Complaint Information obtained from Patient 08/06/2021; left lower extremity wound following trauma History of Present Illness (HPI) Admission/20 08/2021 [name] is a 76 year old female with a past medical history of breast cancer status post bilateral mastectomy that presents the clinic for a 14-monthhistory of nonhealing ulcer to the left lower extremity. On 05/06/2021 while at work she hit her leg against the corner of a desk experiencing a decent laceration. She went to the ED and had sutures placed. These have since been removed. She has been on 2 rounds of antibiotics including Keflex and Bactrim for this issue. She reports chronic pain to the wound site. She has been using Xeroform with dressing changes. She denies purulent drainage. 5/1; patient presents for  follow-up. She started using gentamicin ointment and Hydrofera Blue over the past week. She reports improvement in her chronic pain. She has compression stockings but it is unclear if she is wearing these daily. She currently denies systemic signs of infection. She had a PCR culture done at last clinic visit that showed high levels of Staph aureus. Keystone antibiotics has been ordered and she states she is receiving this soon. 5/9; patient presents for follow-up. She tolerated the compression wrap well. We have been using gentamicin and Hydrofera Blue under the wrap. She is still not obtained her Keystone antibiotics. She states she will try to get this soon. She denies signs of infection. 5/16; patient presents for follow-up. She states she is obtaining Keystone antibiotics today. She has no issues or complaints today. She tolerated the compression wrap well. We did in office ABIs and they were 0.97  on the left. 5/23; patient presents for follow-up. She has her Keystone antibiotics with her today. She has no issues or complaints. She tolerated the compression wrap well. 5/30; patient presents for follow-up. We have been using Keystone antibiotic with Sorbact under compression wrap. She has no issues or complaints today. She denies signs of infection. 6/6; Keystone with Iodoflex as of last week. Per our intake nurse everything looks a little better she is using 3 layer compression 6/13; patient presents for follow-up. She has been using Keystone with Iodoflex under compression. She was increased to 4 layer at last clinic visit. Patient History Family History Cancer - Father, Diabetes - Father, Heart Disease - Father, Hypertension - Father. Social History Former smoker - quit 1998, Marital Status - Married, Alcohol Use - Never, Drug Use - No History, Caffeine Use - Never. Medical History Cardiovascular Patient has history of Hypertension Hospitalization/Surgery History - eyes surgery. -  tonsillectomy. - hysterectomy. - bilateral mastectomy. - deviated septum from a pear falling from a tree.. Medical A Surgical History Notes nd Constitutional Symptoms (General Health) Bilateral mastectomy Breast Cancer Vitamin D deficiency Cardiovascular Ventricular tachycardia Objective Constitutional respirations regular, non-labored and within target range for patient.. Vitals Time Taken: 8:55 AM, Height: 63 in, Weight: 220 lbs, BMI: 39, Temperature: 98 F, Pulse: 83 bpm, Respiratory Rate: 20 breaths/min, Blood Pressure: 151/83 mmHg. Cardiovascular 2+ dorsalis pedis/posterior tibialis pulses. Psychiatric pleasant and cooperative. General Notes: Left lower extremity: T the anterior aspect there is an open wound with granulation tissue and slough. No surrounding signs of infection. Good o edema control. Integumentary (Hair, Skin) Wound #1 status is Open. Original cause of wound was Trauma. The date acquired was: 05/06/2021. The wound has been in treatment 7 weeks. The wound is located on the Left,Anterior Lower Leg. The wound measures 2cm length x 2cm width x 0.1cm depth; 3.142cm^2 area and 0.314cm^3 volume. There is Fat Layer (Subcutaneous Tissue) exposed. There is no tunneling or undermining noted. There is a medium amount of serosanguineous drainage noted. The wound margin is distinct with the outline attached to the wound base. There is large (67-100%) red, pink granulation within the wound bed. There is a small (1-33%) amount of necrotic tissue within the wound bed including Adherent Slough. Assessment Active Problems ICD-10 Non-pressure chronic ulcer of other part of left lower leg with fat layer exposed Unspecified early complication of trauma, initial encounter Essential (primary) hypertension Lymphedema, not elsewhere classified Venous insufficiency (chronic) (peripheral) Malignant neoplasm of unspecified site of unspecified female breast Patient's wound has shown  improvement in appearance since last clinic visit. There is more granulation tissue present. I debrided nonviable tissue. I recommended continuing with Iodoflex and Keystone antibiotics under 4-layer compression. Procedures Wound #1 Pre-procedure diagnosis of Wound #1 is an Abrasion located on the Left,Anterior Lower Leg . There was a Excisional Skin/Subcutaneous Tissue Debridement with a total area of 4 sq cm performed by Kalman Shan, DO. With the following instrument(s): Curette to remove Viable and Non-Viable tissue/material. Material removed includes Subcutaneous Tissue, Slough, Skin: Dermis, Skin: Epidermis, and Fibrin/Exudate after achieving pain control using Lidocaine 5% topical ointment. A time out was conducted at 09:30, prior to the start of the procedure. A Minimum amount of bleeding was controlled with Pressure. The procedure was tolerated well with a pain level of 0 throughout and a pain level of 0 following the procedure. Post Debridement Measurements: 2cm length x 2cm width x 0.1cm depth; 0.314cm^3 volume. Character of Wound/Ulcer Post Debridement is improved. Post procedure  Diagnosis Wound #1: Same as Pre-Procedure Pre-procedure diagnosis of Wound #1 is an Abrasion located on the Left,Anterior Lower Leg . There was a Four Layer Compression Therapy Procedure by Jessica Pilling, RN. Post procedure Diagnosis Wound #1: Same as Pre-Procedure Plan Follow-up Appointments: Return Appointment in 1 week. - Dr. Heber [place] and [place], Room 8 Tuesday 10/01/2021 1115 Dr. Heber [place] and [place], Room 8 Tuesday 10/08/2021 0900 Other: - [employer]- will send out wound care supplies Keystone topical antibiotic bring in at each appt time. Bathing/ Shower/ Hygiene: May shower and wash wound with soap and water. Edema Control - Lymphedema / SCD / Other: Elevate legs to the level of the heart or above for 30 minutes daily and/or when sitting, a frequency of: - 3-4 times a day throughout the  day. Avoid standing for long periods of time. Exercise regularly Moisturize legs daily. - both legs every night before bed. WOUND #1: - Lower Leg Wound Laterality: Left, Anterior Cleanser: Soap and Water 1 x Per Week/30 Days Discharge Instructions: May shower and wash wound with dial antibacterial soap and water prior to dressing change. Peri-Wound Care: Triamcinolone 15 (g) 1 x Per Week/30 Days Discharge Instructions: Use triamcinolone 15 (g) as directed Peri-Wound Care: Sween Lotion (Moisturizing lotion) 1 x Per Week/30 Days Discharge Instructions: Apply moisturizing lotion as directed Topical: keystone topical compounding antibiotics 1 x Per Week/30 Days Discharge Instructions: will apply mupirocin in clinic and will use compounding antibiotics once it arrives at next appt time. Prim Dressing: IODOFLEX 0.9% Cadexomer Iodine Pad 4x6 cm 1 x Per Week/30 Days ary Discharge Instructions: Apply to wound bed as instructed Secondary Dressing: Zetuvit Plus 4x8 in 1 x Per Week/30 Days Discharge Instructions: Apply over primary dressing as directed. Com pression Wrap: FourPress (4 layer compression wrap) 1 x Per Week/30 Days Discharge Instructions: Apply four layer compression as directed. May also use Miliken CoFlex 2 layer compression system as alternative. 1. In office sharp debridement 2. Iodoflex with Keystone antibiotics under 4-layer compression 3. Follow-up in 1 week Electronic Signature(s) Signed: 09/24/2021 10:43:02 AM By: Kalman Shan DO Entered By: Kalman Shan on 09/24/2021 10:42:26 -------------------------------------------------------------------------------- HxROS Details Patient Name: Date of Service: [name], Jessica [name] 09/24/2021 9:00 A M Medical Record Number: [id number] Patient Account Number: [id number] Date of Birth/Sex: Treating RN: [birth year] (76 y.o. [name], [name] Primary Care Provider: [name] Other Clinician: Referring Provider: Treating  Provider/Extender: Loreen Freud in Treatment: 7 Constitutional Symptoms (General Health) Medical History: Past Medical History Notes: Bilateral mastectomy Breast Cancer Vitamin D deficiency Cardiovascular Medical History: Positive for: Hypertension Past Medical History Notes: Ventricular tachycardia Immunizations Pneumococcal Vaccine: Received Pneumococcal Vaccination: No Implantable Devices No devices added Hospitalization / Surgery History Type of Hospitalization/Surgery eyes surgery tonsillectomy hysterectomy bilateral mastectomy deviated septum from a pear falling from a tree. Family and Social History Cancer: Yes - Father; Diabetes: Yes - Father; Heart Disease: Yes - Father; Hypertension: Yes - Father; Former smoker - quit 1998; Marital Status - Married; Alcohol Use: Never; Drug Use: No History; Caffeine Use: Never; Financial Concerns: No; Food, Clothing or Shelter Needs: No; Support System Lacking: No; Transportation Concerns: No Electronic Signature(s) Signed: 09/24/2021 10:43:02 AM By: Kalman Shan DO Signed: 09/24/2021 5:07:46 PM By: Jessica Pilling RN, BSN Entered By: Kalman Shan on 09/24/2021 10:39:24 -------------------------------------------------------------------------------- SuperBill Details Patient Name: Date of Service: [name], Jessica [name] 09/24/2021 Medical Record Number: [id number] Patient Account Number: [id number] Date of Birth/Sex: Treating RN: 30-Apr-1945 (76 y.o. F) [name],  [facility] Primary Care Provider: [name] Other Clinician: Referring Provider: Treating Provider/Extender: Loreen Freud in Treatment: 7 Diagnosis Coding ICD-10 Codes Code Description [phone] Non-pressure chronic ulcer of other part of left lower leg with fat layer exposed T79.9XXA Unspecified early complication of trauma, initial encounter I10 Essential (primary) hypertension I89.0 Lymphedema, not elsewhere  classified I87.2 Venous insufficiency (chronic) (peripheral) C50.919 Malignant neoplasm of unspecified site of unspecified female breast Facility Procedures CPT4 Code: [id number] Description: 59292 - DEB SUBQ TISSUE 20 SQ CM/< ICD-10 Diagnosis Description L97.822 Non-pressure chronic ulcer of other part of left lower leg with fat layer expo Modifier: sed Quantity: 1 Physician Procedures : CPT4 Code Description Modifier [id number] 11042 - WC PHYS SUBQ TISS 20 SQ CM ICD-10 Diagnosis Description L97.822 Non-pressure chronic ulcer of other part 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

## 2021-09-24 NOTE — Progress Notes (Signed)
ANYSA, TACEY ([id number]) Visit Report for 08/20/2021 Arrival Information Details Patient Name: Date of Service: [place], [place] [name] 08/20/2021 9:00 A M Medical Record Number: [id number] Patient Account Number: [id number] Date of Birth/Sex: Treating RN: [birth year] (76 y.o. [name], [name] Primary Care Blayden Conwell: [name] Other Clinician: Referring Joany Khatib: Treating Consuello Lassalle/Extender: Loreen Freud in Treatment: 2 Visit Information History Since Last Visit Added or deleted any medications: No Patient Arrived: [name] Any new allergies or adverse reactions: No Arrival Time: 09:12 Had a fall or experienced change in No Accompanied By: self activities of daily living that may affect Transfer Assistance: None risk of falls: Patient Identification Verified: Yes Signs or symptoms of abuse/neglect since last visito No Secondary Verification Process Completed: Yes Hospitalized since last visit: No Patient Requires Transmission-Based Precautions: No Implantable device outside of the clinic excluding No Patient Has Alerts: No cellular tissue based products placed in the center since last visit: Has Dressing in Place as Prescribed: Yes Has Compression in Place as Prescribed: Yes Pain Present Now: Yes Electronic Signature(s) Signed: 09/24/2021 8:46:54 AM By: Erenest Blank Entered By: Erenest Blank on 08/20/2021 09:16:56 -------------------------------------------------------------------------------- Encounter Discharge Information Details Patient Name: Date of Service: [name], [name] 08/20/2021 9:00 A M Medical Record Number: [id number] Patient Account Number: [id number] Date of Birth/Sex: Treating RN: Jun 20, 1945 (76 y.o. [name], [name] Primary Care Cameryn Chrisley: [name] Other Clinician: Referring Favor Kreh: Treating Keeya Dyckman/Extender: Loreen Freud in Treatment: 2 Encounter Discharge Information Items Post Procedure  Vitals Discharge Condition: Stable Temperature (F): 98.3 Ambulatory Status: Cane Pulse (bpm): 93 Discharge Destination: Home Respiratory Rate (breaths/min): 18 Transportation: Private Auto Blood Pressure (mmHg): 155/69 Accompanied By: self Schedule Follow-up Appointment: Yes Clinical Summary of Care: Electronic Signature(s) Signed: 09/24/2021 8:46:54 AM By: Erenest Blank Entered By: Erenest Blank on 08/20/2021 09:59:58 -------------------------------------------------------------------------------- Lower Extremity Assessment Details Patient Name: Date of Service: [name], [name] 08/20/2021 9:00 A M Medical Record Number: [id number] Patient Account Number: [id number] Date of Birth/Sex: Treating RN: 1946/03/20 (76 y.o. [name], [name] Primary Care Amish Mintzer: [name] Other Clinician: Referring Hilde Churchman: Treating Grayden Burley/Extender: Loreen Freud in Treatment: 2 Edema Assessment Assessed: [Left: Yes] [Right: No] Edema: [Left: Ye] [Right: s] Calf Left: Right: Point of Measurement: 30 cm From Medial Instep 44.5 cm Ankle Left: Right: Point of Measurement: 9 cm From Medial Instep 26.5 cm Vascular Assessment Pulses: Dorsalis Pedis Palpable: [Left:Yes] Electronic Signature(s) Signed: 08/20/2021 5:49:10 PM By: Deon Pilling RN, BSN Signed: 09/24/2021 8:46:54 AM By: Erenest Blank Entered By: Erenest Blank on 08/20/2021 09:18:15 -------------------------------------------------------------------------------- Multi Wound Chart Details Patient Name: Date of Service: [name], [name] 08/20/2021 9:00 A M Medical Record Number: [id number] Patient Account Number: [id number] Date of Birth/Sex: Treating RN: 06-Sep-1945 (76 y.o. [name], [name] Primary Care Iyanni Hepp: [name] Other Clinician: Referring Saree Krogh: Treating Oluwaseyi Raffel/Extender: Loreen Freud in Treatment: 2 Vital Signs Height(in): 63 Pulse(bpm):  93 Weight(lbs): 220 Blood Pressure(mmHg): 155/69 Body Mass Index(BMI): 39 Temperature(F): 98.3 Respiratory Rate(breaths/min): 18 Photos: [N/A:N/A] Left, Anterior Lower Leg N/A N/A Wound Location: Trauma N/A N/A Wounding Event: Abrasion N/A N/A Primary Etiology: Lymphedema N/A N/A Secondary Etiology: Hypertension N/A N/A Comorbid History: 05/06/2021 N/A N/A Date Acquired: 2 N/A N/A Weeks of Treatment: Open N/A N/A Wound Status: No N/A N/A Wound Recurrence: 2.4x2.3x0.1 N/A N/A Measurements L x W x D (cm) 4.335 N/A N/A A (cm) : rea 0.434 N/A N/A Volume (  cm) : -31.40% N/A N/A % Reduction in A rea: -31.50% N/A N/A % Reduction in Volume: Full Thickness Without Exposed N/A N/A Classification: Support Structures Medium N/A N/A Exudate A mount: Serosanguineous N/A N/A Exudate Type: red, brown N/A N/A Exudate Color: Distinct, outline attached N/A N/A Wound Margin: Large (67-100%) N/A N/A Granulation A mount: Red, Pink, Hyper-granulation N/A N/A Granulation Quality: Small (1-33%) N/A N/A Necrotic A mount: Fat Layer (Subcutaneous Tissue): Yes N/A N/A Exposed Structures: Fascia: No Tendon: No Muscle: No Joint: No Bone: No Small (1-33%) N/A N/A Epithelialization: Debridement - Excisional N/A N/A Debridement: Pre-procedure Verification/Time Out 09:20 N/A N/A Taken: Lidocaine 5% topical ointment N/A N/A Pain Control: Subcutaneous, Slough N/A N/A Tissue Debrided: Skin/Subcutaneous Tissue N/A N/A Level: 5.52 N/A N/A Debridement A (sq cm): rea Curette N/A N/A Instrument: Minimum N/A N/A Bleeding: Pressure N/A N/A Hemostasis A chieved: 0 N/A N/A Procedural Pain: 0 N/A N/A Post Procedural Pain: Procedure was tolerated well N/A N/A Debridement Treatment Response: 2.4x2.3x0.1 N/A N/A Post Debridement Measurements L x W x D (cm) 0.434 N/A N/A Post Debridement Volume: (cm) Debridement N/A N/A Procedures Performed: Treatment Notes Wound #1  (Lower Leg) Wound Laterality: Left, Anterior Cleanser Soap and Water Discharge Instruction: May shower and wash wound with dial antibacterial soap and water prior to dressing change. Peri-Wound Care Sween Lotion (Moisturizing lotion) Discharge Instruction: Apply moisturizing lotion as directed Topical Mupirocin Ointment Discharge Instruction: Apply Mupirocin (Bactroban) as instructed keystone topical compounding antibiotics Discharge Instruction: will apply mupirocin in clinic and will use compounding antibiotics once it arrives at next appt time. Primary Dressing Cutimed Sorbact Swab Discharge Instruction: Apply to wound bed over the ointment and apply hydrogel over the sorbact. Secondary Dressing Zetuvit Plus 4x8 in Discharge Instruction: Apply over primary dressing as directed. Secured With Compression Wrap Kerlix Roll 4.5x3.1 (in/yd) Discharge Instruction: Apply Kerlix and Coban compression as directed. Coban Self-Adherent Wrap 4x5 (in/yd) Discharge Instruction: Apply over Kerlix as directed. Compression Stockings Add-Ons Electronic Signature(s) Signed: 08/20/2021 11:55:25 AM By: Kalman Shan DO Signed: 08/20/2021 5:49:10 PM By: Deon Pilling RN, BSN Entered By: Kalman Shan on 08/20/2021 10:40:09 -------------------------------------------------------------------------------- Multi-Disciplinary Care Plan Details Patient Name: Date of Service: [name], [name] 08/20/2021 9:00 A M Medical Record Number: [id number] Patient Account Number: [id number] Date of Birth/Sex: Treating RN: 1945-12-23 (76 y.o. [name], [name] Primary Care Krystyl Cannell: [name] Other Clinician: Referring Bernita Beckstrom: Treating Tonatiuh Mallon/Extender: Loreen Freud in Treatment: 2 Active Inactive Pain, Acute or Chronic Nursing Diagnoses: Pain, acute or chronic: actual or potential Potential alteration in comfort, pain Goals: Patient will verbalize adequate pain  control and receive pain control interventions during procedures as needed Date Initiated: 08/06/2021 Target Resolution Date: 09/06/2021 Goal Status: Active Patient/caregiver will verbalize comfort level met Date Initiated: 08/06/2021 Target Resolution Date: 09/06/2021 Goal Status: Active Interventions: Encourage patient to take pain medications as prescribed Provide education on pain management Reposition patient for comfort Treatment Activities: Administer pain control measures as ordered : 08/06/2021 Notes: Wound/Skin Impairment Nursing Diagnoses: Knowledge deficit related to ulceration/compromised skin integrity Goals: Patient/caregiver will verbalize understanding of skin care regimen Date Initiated: 08/06/2021 Target Resolution Date: 09/06/2021 Goal Status: Active Interventions: Assess patient/caregiver ability to obtain necessary supplies Assess patient/caregiver ability to perform ulcer/skin care regimen upon admission and as needed Provide education on ulcer and skin care Treatment Activities: Skin care regimen initiated : 08/06/2021 Topical wound management initiated : 08/06/2021 Notes: Electronic Signature(s) Signed: 08/20/2021 5:49:10 PM By: Deon Pilling RN, BSN Entered By:  Deon Pilling on 08/20/2021 09:27:49 -------------------------------------------------------------------------------- Pain Assessment Details Patient Name: Date of Service: Marylene [place], [place] [name] 08/20/2021 9:00 A M Medical Record Number: [id number] Patient Account Number: [id number] Date of Birth/Sex: Treating RN: 1945/09/14 (76 y.o. [name] Primary Care Donovin Kraemer: [name] Other Clinician: Referring Anavictoria Wilk: Treating Brenly Trawick/Extender: Loreen Freud in Treatment: 2 Active Problems Location of Pain Severity and Description of Pain Patient Has Paino Yes Site Locations Pain Location: Generalized Pain Duration of the Pain. Constant / Intermittento  Intermittent Rate the pain. Current Pain Level: 7 Pain Management and Medication Current Pain Management: Electronic Signature(s) Signed: 08/20/2021 5:49:10 PM By: Deon Pilling RN, BSN Signed: 09/24/2021 8:46:54 AM By: Erenest Blank Entered By: Erenest Blank on 08/20/2021 09:17:57 -------------------------------------------------------------------------------- Patient/Caregiver Education Details Patient Name: Date of Service: [name], [name] 5/9/2023andnbsp9:00 A M Medical Record Number: [id number] Patient Account Number: [id number] Date of Birth/Gender: Treating RN: 1946-01-21 (76 y.o. [name], [name] Primary Care Physician: [name] Other Clinician: Referring Physician: Treating Physician/Extender: Loreen Freud in Treatment: 2 Education Assessment Education Provided To: Patient Education Topics Provided Wound/Skin Impairment: Handouts: Skin Care Do's and Dont's Methods: Explain/Verbal Responses: Reinforcements needed Electronic Signature(s) Signed: 08/20/2021 5:49:10 PM By: Deon Pilling RN, BSN Entered By: Deon Pilling on 08/20/2021 09:27:59 -------------------------------------------------------------------------------- Wound Assessment Details Patient Name: Date of Service: [name], [name] 08/20/2021 9:00 A M Medical Record Number: [id number] Patient Account Number: [id number] Date of Birth/Sex: Treating RN: 07/18/1945 (76 y.o. [name], [name] Primary Care Trevante Tennell: [name] Other Clinician: Referring Paloma Grange: Treating Tiffnay Bossi/Extender: Loreen Freud in Treatment: 2 Wound Status Wound Number: 1 Primary Etiology: Abrasion Wound Location: Left, Anterior Lower Leg Secondary Etiology: Lymphedema Wounding Event: Trauma Wound Status: Open Date Acquired: 05/06/2021 Comorbid History: Hypertension Weeks Of Treatment: 2 Clustered Wound: No Photos Wound Measurements Length: (cm)  2.4 Width: (cm) 2.3 Depth: (cm) 0.1 Area: (cm) 4.335 Volume: (cm) 0.434 % Reduction in Area: -31.4% % Reduction in Volume: -31.5% Epithelialization: Small (1-33%) Tunneling: No Undermining: No Wound Description Classification: Full Thickness Without Exposed Support Structures Wound Margin: Distinct, outline attached Exudate Amount: Medium Exudate Type: Serosanguineous Exudate Color: red, brown Foul Odor After Cleansing: No Slough/Fibrino Yes Wound Bed Granulation Amount: Large (67-100%) Exposed Structure Granulation Quality: Red, Pink, Hyper-granulation Fascia Exposed: No Necrotic Amount: Small (1-33%) Fat Layer (Subcutaneous Tissue) Exposed: Yes Necrotic Quality: Adherent Slough Tendon Exposed: No Muscle Exposed: No Joint Exposed: No Bone Exposed: No Electronic Signature(s) Signed: 08/20/2021 5:49:10 PM By: Deon Pilling RN, BSN Signed: 09/24/2021 8:46:54 AM By: Erenest Blank Entered By: Erenest Blank on 08/20/2021 09:21:54 -------------------------------------------------------------------------------- Vitals Details Patient Name: Date of Service: [name], [name] 08/20/2021 9:00 A M Medical Record Number: [id number] Patient Account Number: [id number] Date of Birth/Sex: Treating RN: 01/31/1946 (76 y.o. [name], [name] Primary Care Roberts Bon: [name] Other Clinician: Referring Arkie Tagliaferro: Treating Chasidy Janak/Extender: Loreen Freud in Treatment: 2 Vital Signs Time Taken: 09:17 Temperature (F): 98.3 Height (in): 63 Pulse (bpm): 93 Weight (lbs): 220 Respiratory Rate (breaths/min): 18 Body Mass Index (BMI): 39 Blood Pressure (mmHg): 155/69 Reference Range: 80 - 120 mg / dl Electronic Signature(s) Signed: 09/24/2021 8:46:54 AM By: Erenest Blank Entered By: Erenest Blank on 08/20/2021 09:17:24

## 2021-09-26 NOTE — Progress Notes (Signed)
[name], [name] ([id number]) Visit Report for 09/24/2021 Arrival Information Details Patient Name: Date of Service: [name], Jessica [name] 09/24/2021 9:00 A M Medical Record Number: [id number] Patient Account Number: [id number] Date of Birth/Sex: Treating RN: Jul 08, 1945 (76 y.o. [name], [name] Primary Care [name] Other Clinician: Referring Neil Errickson: Treating Aiman Sonn/Extender: Loreen Freud in Treatment: 7 Visit Information History Since Last Visit Added or deleted any medications: No Patient Arrived: Ambulatory Any new allergies or adverse reactions: No Arrival Time: 08:58 Had a fall or experienced change in No Accompanied By: self activities of daily living that may affect Transfer Assistance: None risk of falls: Patient Identification Verified: Yes Signs or symptoms of abuse/neglect since last visito No Secondary Verification Process Completed: Yes Hospitalized since last visit: No Patient Requires Transmission-Based Precautions: No Implantable device outside of the clinic excluding No Patient Has Alerts: No cellular tissue based products placed in the center since last visit: Has Dressing in Place as Prescribed: Yes Has Compression in Place as Prescribed: Yes Pain Present Now: No Electronic Signature(s) Signed: 09/24/2021 5:07:46 PM By: Deon Pilling RN, BSN Entered By: Deon Pilling on 09/24/2021 08:59:05 -------------------------------------------------------------------------------- Compression Therapy Details Patient Name: Date of Service: [name], [name] 09/24/2021 9:00 A M Medical Record Number: [id number] Patient Account Number: [id number] Date of Birth/Sex: Treating RN: 1946/04/03 (76 y.o. [name], [name] Primary Care Mercy Malena: Howie [name] Other Clinician: Referring Mitcheal Sweetin: Treating Elliett Guarisco/Extender: Loreen Freud in Treatment: 7 Compression Therapy Performed for Wound Assessment:  Wound #1 Left,Anterior Lower Leg Performed By: Clinician Deon Pilling, RN Compression Type: Four Layer Post Procedure Diagnosis Same as Pre-procedure Electronic Signature(s) Signed: 09/24/2021 5:07:46 PM By: Deon Pilling RN, BSN Entered By: Deon Pilling on 09/24/2021 09:32:59 -------------------------------------------------------------------------------- Encounter Discharge Information Details Patient Name: Date of Service: [name], [name] 09/24/2021 9:00 A M Medical Record Number: [id number] Patient Account Number: [id number] Date of Birth/Sex: Treating RN: 1945-08-20 (76 y.o. [name], [name] Primary Care Amana Bouska: Howie [name] Other Clinician: Referring Maybelline Kolarik: Treating Keisi Eckford/Extender: Loreen Freud in Treatment: 7 Encounter Discharge Information Items Post Procedure Vitals Discharge Condition: Stable Temperature (F): 98 Ambulatory Status: Cane Pulse (bpm): 83 Discharge Destination: Home Respiratory Rate (breaths/min): 20 Transportation: Private Auto Blood Pressure (mmHg): 151/83 Accompanied By: self Schedule Follow-up Appointment: Yes Clinical Summary of Care: Electronic Signature(s) Signed: 09/24/2021 5:07:46 PM By: Deon Pilling RN, BSN Entered By: Deon Pilling on 09/24/2021 09:36:55 -------------------------------------------------------------------------------- Lower Extremity Assessment Details Patient Name: Date of Service: [name], [name] 09/24/2021 9:00 A M Medical Record Number: [id number] Patient Account Number: [id number] Date of Birth/Sex: Treating RN: 07-08-1945 (76 y.o. [name], [name] Primary Care Keatyn Luck: Howie [name] Other Clinician: Referring Nate Perri: Treating Raeley Gilmore/Extender: Loreen Freud in Treatment: 7 Edema Assessment Assessed: [Left: Yes] [Right: No] Edema: [Left: N] [Right: o] Calf Left: Right: Point of Measurement: 30 cm From Medial Instep 43  cm Ankle Left: Right: Point of Measurement: 9 cm From Medial Instep 26 cm Vascular Assessment Pulses: Dorsalis Pedis Palpable: [Left:Yes] Electronic Signature(s) Signed: 09/24/2021 5:07:46 PM By: Deon Pilling RN, BSN Entered By: Deon Pilling on 09/24/2021 09:01:25 -------------------------------------------------------------------------------- Multi Wound Chart Details Patient Name: Date of Service: [name], [name] 09/24/2021 9:00 A M Medical Record Number: [id number] Patient Account Number: [id number] Date of Birth/Sex: Treating RN: 08/20/1945 (76 y.o. [name], [name] Primary Care Gianella Chismar: Howie [name] Other Clinician: Referring Calaya Gildner: Treating Shawnta Schlegel/Extender: [place]  Violet Baldy, Madelon Lips Weeks in Treatment: 7 Vital Signs Height(in): 63 Pulse(bpm): 83 Weight(lbs): 220 Blood Pressure(mmHg): 151/83 Body Mass Index(BMI): 39 Temperature(F): 98 Respiratory Rate(breaths/min): 20 Photos: [N/A:N/A] Left, Anterior Lower Leg N/A N/A Wound Location: Trauma N/A N/A Wounding Event: Abrasion N/A N/A Primary Etiology: Lymphedema N/A N/A Secondary Etiology: Hypertension N/A N/A Comorbid History: 05/06/2021 N/A N/A Date Acquired: 7 N/A N/A Weeks of Treatment: Open N/A N/A Wound Status: No N/A N/A Wound Recurrence: 2x2x0.1 N/A N/A Measurements L x W x D (cm) 3.142 N/A N/A A (cm) : rea 0.314 N/A N/A Volume (cm) : 4.80% N/A N/A % Reduction in A rea: 4.80% N/A N/A % Reduction in Volume: Full Thickness Without Exposed N/A N/A Classification: Support Structures Medium N/A N/A Exudate A mount: Serosanguineous N/A N/A Exudate Type: red, brown N/A N/A Exudate Color: Distinct, outline attached N/A N/A Wound Margin: Large (67-100%) N/A N/A Granulation A mount: Red, Pink N/A N/A Granulation Quality: Small (1-33%) N/A N/A Necrotic A mount: Fat Layer (Subcutaneous Tissue): Yes N/A N/A Exposed Structures: Fascia: No Tendon: No Muscle:  No Joint: No Bone: No Medium (34-66%) N/A N/A Epithelialization: Debridement - Excisional N/A N/A Debridement: Pre-procedure Verification/Time Out 09:30 N/A N/A Taken: Lidocaine 5% topical ointment N/A N/A Pain Control: Subcutaneous, Slough N/A N/A Tissue Debrided: Skin/Subcutaneous Tissue N/A N/A Level: 4 N/A N/A Debridement A (sq cm): rea Curette N/A N/A Instrument: Minimum N/A N/A Bleeding: Pressure N/A N/A Hemostasis A chieved: 0 N/A N/A Procedural Pain: 0 N/A N/A Post Procedural Pain: Procedure was tolerated well N/A N/A Debridement Treatment Response: 2x2x0.1 N/A N/A Post Debridement Measurements L x W x D (cm) 0.314 N/A N/A Post Debridement Volume: (cm) Compression Therapy N/A N/A Procedures Performed: Debridement Treatment Notes Wound #1 (Lower Leg) Wound Laterality: Left, Anterior Cleanser Soap and Water Discharge Instruction: May shower and wash wound with dial antibacterial soap and water prior to dressing change. Peri-Wound Care Triamcinolone 15 (g) Discharge Instruction: Use triamcinolone 15 (g) as directed Sween Lotion (Moisturizing lotion) Discharge Instruction: Apply moisturizing lotion as directed Topical keystone topical compounding antibiotics Discharge Instruction: will apply mupirocin in clinic and will use compounding antibiotics once it arrives at next appt time. Primary Dressing IODOFLEX 0.9% Cadexomer Iodine Pad 4x6 cm Discharge Instruction: Apply to wound bed as instructed Secondary Dressing Zetuvit Plus 4x8 in Discharge Instruction: Apply over primary dressing as directed. Secured With Compression Wrap FourPress (4 layer compression wrap) Discharge Instruction: Apply four layer compression as directed. May also use Miliken CoFlex 2 layer compression system as alternative. Compression Stockings Add-Ons Electronic Signature(s) Signed: 09/24/2021 10:43:02 AM By: Kalman Shan DO Signed: 09/24/2021 5:07:46 PM By: Deon Pilling  RN, BSN Entered By: Kalman Shan on 09/24/2021 10:14:30 -------------------------------------------------------------------------------- Multi-Disciplinary Care Plan Details Patient Name: Date of Service: [name], [name] 09/24/2021 9:00 A M Medical Record Number: [id number] Patient Account Number: [id number] Date of Birth/Sex: Treating RN: 27-Jan-1946 (76 y.o. [name], [name] Primary Care Maston Wight: Howie [name] Other Clinician: Referring Orazio Weller: Treating Darrian Goodwill/Extender: Loreen Freud in Treatment: 7 Active Inactive Pain, Acute or Chronic Nursing Diagnoses: Pain, acute or chronic: actual or potential Potential alteration in comfort, pain Goals: Patient will verbalize adequate pain control and receive pain control interventions during procedures as needed Date Initiated: 08/06/2021 Target Resolution Date: 10/04/2021 Goal Status: Active Patient/caregiver will verbalize comfort level met Date Initiated: 08/06/2021 Target Resolution Date: 10/10/2021 Goal Status: Active Interventions: Encourage patient to take pain medications as prescribed Provide education on pain management Reposition patient  for comfort Treatment Activities: Administer pain control measures as ordered : 08/06/2021 Notes: Wound/Skin Impairment Nursing Diagnoses: Knowledge deficit related to ulceration/compromised skin integrity Goals: Patient/caregiver will verbalize understanding of skin care regimen Date Initiated: 08/06/2021 Target Resolution Date: 10/10/2021 Goal Status: Active Interventions: Assess patient/caregiver ability to obtain necessary supplies Assess patient/caregiver ability to perform ulcer/skin care regimen upon admission and as needed Provide education on ulcer and skin care Treatment Activities: Skin care regimen initiated : 08/06/2021 Topical wound management initiated : 08/06/2021 Notes: Electronic Signature(s) Signed: 09/24/2021 5:07:46 PM By:  Deon Pilling RN, BSN Entered By: Deon Pilling on 09/24/2021 09:05:29 -------------------------------------------------------------------------------- Pain Assessment Details Patient Name: Date of Service: [name], [name] 09/24/2021 9:00 A M Medical Record Number: [id number] Patient Account Number: [id number] Date of Birth/Sex: Treating RN: 02/04/1946 (76 y.o. [name], [name] Primary Care Brantlee Penn: Howie [name] Other Clinician: Referring Krissa Utke: Treating Cicley Ganesh/Extender: Loreen Freud in Treatment: 7 Active Problems Location of Pain Severity and Description of Pain Patient Has Paino No Site Locations Pain Management and Medication Current Pain Management: Electronic Signature(s) Signed: 09/24/2021 5:07:46 PM By: Deon Pilling RN, BSN Entered By: Deon Pilling on 09/24/2021 09:01:18 -------------------------------------------------------------------------------- Patient/Caregiver Education Details Patient Name: Date of Service: [name], [name] 6/13/2023andnbsp9:00 A M Medical Record Number: [id number] Patient Account Number: [id number] Date of Birth/Gender: Treating RN: 09/03/1945 (76 y.o. [name], [name] Primary Care Physician: Howie [name] Other Clinician: Referring Physician: Treating Physician/Extender: Loreen Freud in Treatment: 7 Education Assessment Education Provided To: Patient Education Topics Provided Wound/Skin Impairment: Handouts: Skin Care Do's and Dont's Methods: Explain/Verbal Responses: Reinforcements needed Electronic Signature(s) Signed: 09/24/2021 5:07:46 PM By: Deon Pilling RN, BSN Entered By: Deon Pilling on 09/24/2021 09:05:38 -------------------------------------------------------------------------------- Wound Assessment Details Patient Name: Date of Service: [name], [name] 09/24/2021 9:00 A M Medical Record Number: [id number] Patient Account Number:  [id number] Date of Birth/Sex: Treating RN: [birth year] (76 y.o. [name], [name] Primary Care Alasdair Kleve: Howie [name] Other Clinician: Referring Ishaq Maffei: Treating Alaa Eyerman/Extender: Loreen Freud in Treatment: 7 Wound Status Wound Number: 1 Primary Etiology: Abrasion Wound Location: Left, Anterior Lower Leg Secondary Etiology: Lymphedema Wounding Event: Trauma Wound Status: Open Date Acquired: 05/06/2021 Comorbid History: Hypertension Weeks Of Treatment: 7 Clustered Wound: No Photos Wound Measurements Length: (cm) 2 Width: (cm) 2 Depth: (cm) 0.1 Area: (cm) 3.142 Volume: (cm) 0.314 % Reduction in Area: 4.8% % Reduction in Volume: 4.8% Epithelialization: Medium (34-66%) Tunneling: No Undermining: No Wound Description Classification: Full Thickness Without Exposed Support Structures Wound Margin: Distinct, outline attached Exudate Amount: Medium Exudate Type: Serosanguineous Exudate Color: red, brown Foul Odor After Cleansing: No Slough/Fibrino Yes Wound Bed Granulation Amount: Large (67-100%) Exposed Structure Granulation Quality: Red, Pink Fascia Exposed: No Necrotic Amount: Small (1-33%) Fat Layer (Subcutaneous Tissue) Exposed: Yes Necrotic Quality: Adherent Slough Tendon Exposed: No Muscle Exposed: No Joint Exposed: No Bone Exposed: No Treatment Notes Wound #1 (Lower Leg) Wound Laterality: Left, Anterior Cleanser Soap and Water Discharge Instruction: May shower and wash wound with dial antibacterial soap and water prior to dressing change. Peri-Wound Care Triamcinolone 15 (g) Discharge Instruction: Use triamcinolone 15 (g) as directed Sween Lotion (Moisturizing lotion) Discharge Instruction: Apply moisturizing lotion as directed Topical keystone topical compounding antibiotics Discharge Instruction: will apply mupirocin in clinic and will use compounding antibiotics once it arrives at next appt time. Primary  Dressing IODOFLEX 0.9% Cadexomer Iodine Pad 4x6 cm Discharge Instruction: Apply to wound bed as instructed  Secondary Dressing Zetuvit Plus 4x8 in Discharge Instruction: Apply over primary dressing as directed. Secured With Compression Wrap FourPress (4 layer compression wrap) Discharge Instruction: Apply four layer compression as directed. May also use Miliken CoFlex 2 layer compression system as alternative. Compression Stockings Add-Ons Electronic Signature(s) Signed: 09/24/2021 5:07:46 PM By: Deon Pilling RN, BSN Signed: 09/26/2021 5:29:07 PM By: Rhae Hammock RN Entered By: Rhae Hammock on 09/24/2021 09:03:08 -------------------------------------------------------------------------------- Vitals Details Patient Name: Date of Service: [name], [name] 09/24/2021 9:00 A M Medical Record Number: [id number] Patient Account Number: [id number] Date of Birth/Sex: Treating RN: 05/25/1945 (76 y.o. [name], [name] Primary Care Suhail Peloquin: Howie [name] Other Clinician: Referring Sidonia Nutter: Treating Izzabell Klasen/Extender: Loreen Freud in Treatment: 7 Vital Signs Time Taken: 08:55 Temperature (F): 98 Height (in): 63 Pulse (bpm): 83 Weight (lbs): 220 Respiratory Rate (breaths/min): 20 Body Mass Index (BMI): 39 Blood Pressure (mmHg): 151/83 Reference Range: 80 - 120 mg / dl Electronic Signature(s) Signed: 09/24/2021 5:07:46 PM By: Deon Pilling RN, BSN Entered By: Deon Pilling on 09/24/2021 08:59:16

## 2021-09-27 DIAGNOSIS — Z6837 Body mass index (BMI) 37.0-37.9, adult: Secondary | ICD-10-CM | POA: Diagnosis not present

## 2021-09-27 DIAGNOSIS — Z791 Long term (current) use of non-steroidal anti-inflammatories (NSAID): Secondary | ICD-10-CM | POA: Diagnosis not present

## 2021-09-27 DIAGNOSIS — K219 Gastro-esophageal reflux disease without esophagitis: Secondary | ICD-10-CM | POA: Diagnosis not present

## 2021-09-27 DIAGNOSIS — J45909 Unspecified asthma, uncomplicated: Secondary | ICD-10-CM | POA: Diagnosis not present

## 2021-09-27 DIAGNOSIS — N3281 Overactive bladder: Secondary | ICD-10-CM | POA: Diagnosis not present

## 2021-09-27 DIAGNOSIS — R32 Unspecified urinary incontinence: Secondary | ICD-10-CM | POA: Diagnosis not present

## 2021-09-27 DIAGNOSIS — I10 Essential (primary) hypertension: Secondary | ICD-10-CM | POA: Diagnosis not present

## 2021-09-27 DIAGNOSIS — Z853 Personal history of malignant neoplasm of breast: Secondary | ICD-10-CM | POA: Diagnosis not present

## 2021-10-01 ENCOUNTER — Encounter (HOSPITAL_BASED_OUTPATIENT_CLINIC_OR_DEPARTMENT_OTHER): Payer: No Typology Code available for payment source | Admitting: Internal Medicine

## 2021-10-01 DIAGNOSIS — L97822 Non-pressure chronic ulcer of other part of left lower leg with fat layer exposed: Secondary | ICD-10-CM

## 2021-10-01 DIAGNOSIS — Z853 Personal history of malignant neoplasm of breast: Secondary | ICD-10-CM | POA: Diagnosis not present

## 2021-10-01 DIAGNOSIS — I890 Lymphedema, not elsewhere classified: Secondary | ICD-10-CM | POA: Diagnosis not present

## 2021-10-01 DIAGNOSIS — T798XXA Other early complications of trauma, initial encounter: Secondary | ICD-10-CM | POA: Diagnosis not present

## 2021-10-01 DIAGNOSIS — Z9013 Acquired absence of bilateral breasts and nipples: Secondary | ICD-10-CM | POA: Diagnosis not present

## 2021-10-01 DIAGNOSIS — I872 Venous insufficiency (chronic) (peripheral): Secondary | ICD-10-CM | POA: Diagnosis not present

## 2021-10-01 DIAGNOSIS — I10 Essential (primary) hypertension: Secondary | ICD-10-CM | POA: Diagnosis not present

## 2021-10-08 ENCOUNTER — Encounter (HOSPITAL_BASED_OUTPATIENT_CLINIC_OR_DEPARTMENT_OTHER): Payer: No Typology Code available for payment source | Admitting: Internal Medicine

## 2021-10-08 DIAGNOSIS — I890 Lymphedema, not elsewhere classified: Secondary | ICD-10-CM | POA: Diagnosis not present

## 2021-10-08 DIAGNOSIS — T798XXA Other early complications of trauma, initial encounter: Secondary | ICD-10-CM | POA: Diagnosis not present

## 2021-10-08 DIAGNOSIS — I872 Venous insufficiency (chronic) (peripheral): Secondary | ICD-10-CM | POA: Diagnosis not present

## 2021-10-08 DIAGNOSIS — Z9013 Acquired absence of bilateral breasts and nipples: Secondary | ICD-10-CM | POA: Diagnosis not present

## 2021-10-08 DIAGNOSIS — Z853 Personal history of malignant neoplasm of breast: Secondary | ICD-10-CM | POA: Diagnosis not present

## 2021-10-08 DIAGNOSIS — L97822 Non-pressure chronic ulcer of other part of left lower leg with fat layer exposed: Secondary | ICD-10-CM

## 2021-10-08 DIAGNOSIS — I10 Essential (primary) hypertension: Secondary | ICD-10-CM | POA: Diagnosis not present

## 2021-10-17 ENCOUNTER — Encounter (HOSPITAL_BASED_OUTPATIENT_CLINIC_OR_DEPARTMENT_OTHER): Payer: No Typology Code available for payment source | Attending: Internal Medicine | Admitting: Internal Medicine

## 2021-10-17 DIAGNOSIS — X58XXXA Exposure to other specified factors, initial encounter: Secondary | ICD-10-CM | POA: Insufficient documentation

## 2021-10-17 DIAGNOSIS — I10 Essential (primary) hypertension: Secondary | ICD-10-CM | POA: Diagnosis not present

## 2021-10-17 DIAGNOSIS — L97822 Non-pressure chronic ulcer of other part of left lower leg with fat layer exposed: Secondary | ICD-10-CM | POA: Insufficient documentation

## 2021-10-17 DIAGNOSIS — I872 Venous insufficiency (chronic) (peripheral): Secondary | ICD-10-CM | POA: Diagnosis not present

## 2021-10-17 DIAGNOSIS — T799XXA Unspecified early complication of trauma, initial encounter: Secondary | ICD-10-CM | POA: Diagnosis not present

## 2021-10-17 DIAGNOSIS — C50919 Malignant neoplasm of unspecified site of unspecified female breast: Secondary | ICD-10-CM | POA: Insufficient documentation

## 2021-10-17 DIAGNOSIS — I890 Lymphedema, not elsewhere classified: Secondary | ICD-10-CM | POA: Diagnosis not present

## 2021-10-17 NOTE — Progress Notes (Signed)
[name], [name] ([id number]) Visit Report for 10/17/2021 Arrival Information Details Patient Name: Date of Service: Screven, [name] 10/17/2021 9:00 A M Medical Record Number: [id number] Patient Account Number: [id number] Date of Birth/Sex: Treating RN: 1945/08/30 (76 y.o. [name], [name] Primary Care Provider: [name] Other Clinician: Referring Provider: Treating Provider/Extender: Loreen Freud in Treatment: 10 Visit Information History Since Last Visit Added or deleted any medications: No Patient Arrived: [name] Any new allergies or adverse reactions: No Arrival Time: 09:20 Had a fall or experienced change in No Accompanied By: self activities of daily living that may affect Transfer Assistance: None risk of falls: Patient Identification Verified: Yes Signs or symptoms of abuse/neglect since last visito No Secondary Verification Process Completed: Yes Hospitalized since last visit: No Patient Requires Transmission-Based Precautions: No Implantable device outside of the clinic excluding No Patient Has Alerts: No cellular tissue based products placed in the center since last visit: Has Dressing in Place as Prescribed: Yes Has Compression in Place as Prescribed: Yes Pain Present Now: No Electronic Signature(s) Signed: 10/17/2021 4:18:35 PM By: Deon Pilling RN, BSN Entered By: Deon Pilling on 10/17/2021 09:26:48 -------------------------------------------------------------------------------- Compression Therapy Details Patient Name: Date of Service: CO [name], [name] 10/17/2021 9:00 A M Medical Record Number: [id number] Patient Account Number: [id number] Date of Birth/Sex: Treating RN: 1945-08-27 (76 y.o. [name], [name] in Treatment: 10 Compression Therapy Performed for Wound Assessment: Wound #1  Left,Anterior Lower Leg Performed By: Clinician Deon Pilling, RN Compression Type: Double Layer Post Procedure Diagnosis Same as Pre-procedure Electronic Signature(s) Signed: 10/17/2021 4:18:35 PM By: Deon Pilling RN, BSN Entered By: Deon Pilling on 10/17/2021 09:53:02 -------------------------------------------------------------------------------- Encounter Discharge Information Details Patient Name: Date of Service: CO [name], [name] 10/17/2021 9:00 A M Medical Record Number: [id number] Patient Account Number: [id number] Date of Birth/Sex: Treating RN: 1946-02-15 (76 y.o. [name], [name] in Treatment: 10 Encounter Discharge Information Items Discharge Condition: Stable Ambulatory Status: Cane Discharge Destination: Home Transportation: Private Auto Accompanied By: self Schedule Follow-up Appointment: Yes Clinical Summary of Care: Electronic Signature(s) Signed: 10/17/2021 4:18:35 PM By: Deon Pilling RN, BSN Entered By: Deon Pilling on 10/17/2021 09:54:22 -------------------------------------------------------------------------------- Lower Extremity Assessment Details Patient Name: Date of Service: [name], [name] 10/17/2021 9:00 A M Medical Record Number: [id number] Patient Account Number: [id number] Date of Birth/Sex: Treating RN: 1945/09/27 (76 y.o. [name], [name] in Treatment: 10 Edema Assessment Assessed: [name]: Yes] [name]: No] Edema: [Left: N] [Right: o] Calf Left: Right: Point of Measurement: 30 cm From Medial Instep 41 cm Ankle Left: Right: Point of Measurement: 9 cm From Medial Instep 29.5 cm Vascular Assessment Pulses: Dorsalis Pedis Palpable:  [Left:Yes] Electronic Signature(s) Signed: 10/17/2021 4:18:35 PM By: Deon Pilling RN, BSN Entered By: Deon Pilling on 10/17/2021 09:29:22 -------------------------------------------------------------------------------- Multi Wound Chart Details Patient Name: Date of Service: CO [name], [name] 10/17/2021 9:00 A M Medical Record Number: [id number] Patient Account Number: [id number] Date of Birth/Sex: Treating RN: [birth year] (76 y.o. [name] Primary Care Provider: [name] Other Clinician: Referring Provider: Treating Provider/Extender: Loreen Freud in Treatment: 10 Vital Signs Height(in): 63 Pulse(bpm): 96 Weight(lbs): 220 Blood  Pressure(mmHg): 116/82 Body Mass Index(BMI): 39 Temperature(F): 98 Respiratory Rate(breaths/min): 20 Photos: [N/A:N/A] Left, Anterior Lower Leg N/A N/A Wound Location: Trauma N/A N/A Wounding Event: Abrasion N/A N/A Primary Etiology: Lymphedema N/A N/A Secondary Etiology: Hypertension N/A N/A Comorbid History: 05/06/2021 N/A N/A Date Acquired: 10 N/A N/A Weeks of Treatment: Open N/A N/A Wound Status: No N/A N/A Wound Recurrence: 1.5x1.7x0.1 N/A N/A Measurements L x W x D (cm) 2.003 N/A N/A A (cm) : rea 0.2 N/A N/A Volume (cm) : 39.30% N/A N/A % Reduction in A rea: 39.40% N/A N/A % Reduction in Volume: Full Thickness Without Exposed N/A N/A Classification: Support Structures Medium N/A N/A Exudate Amount: Serosanguineous N/A N/A Exudate Type: red, brown N/A N/A Exudate Color: Distinct, outline attached N/A N/A Wound Margin: Large (67-100%) N/A N/A Granulation Amount: Red, Pink N/A N/A Granulation Quality: Small (1-33%) N/A N/A Necrotic Amount: Fat Layer (Subcutaneous Tissue): Yes N/A N/A Exposed Structures: Fascia: No Tendon: No Muscle: No Joint: No Bone: No Medium (34-66%) N/A N/A Epithelialization: Compression Therapy N/A N/A Procedures Performed: Treatment  Notes Wound #1 (Lower Leg) Wound Laterality: Left, Anterior Cleanser Soap and Water Discharge Instruction: May shower and wash wound with dial antibacterial soap and water prior to dressing change. Peri-Wound Care Triamcinolone 15 (g) Discharge Instruction: Use triamcinolone 15 (g) as directed Sween Lotion (Moisturizing lotion) Discharge Instruction: Apply moisturizing lotion as directed Topical keystone topical compounding antibiotics Discharge Instruction: will apply mupirocin in clinic and will use compounding antibiotics once it arrives at next appt time. Primary Dressing PolyMem Silver Non-Adhesive Dressing, 4.25x4.25 in Discharge Instruction: Apply to wound bed as instructed Secondary Dressing Zetuvit Plus 4x8 in Discharge Instruction: Apply over primary dressing as directed. Secured With Compression Wrap FourPress (4 layer compression wrap) Discharge Instruction: Apply four layer compression as directed. May also use Miliken CoFlex 2 layer compression system as alternative. Compression Stockings Add-Ons Electronic Signature(s) Signed: 10/17/2021 12:03:16 PM By: Kalman Shan DO Signed: 10/17/2021 4:18:35 PM By: Deon Pilling RN, BSN Entered By: Kalman Shan on 10/17/2021 11:04:09 -------------------------------------------------------------------------------- Multi-Disciplinary Care Plan Details Patient Name: Date of Service: CO [name], [name] 10/17/2021 9:00 A M Medical Record Number: [id number] Patient Account Number: [id number] Date of Birth/Sex: Treating RN: 03/24/1946 (76 y.o. [name], [name] Primary Care Provider: [name] Other Clinician: Referring Provider: Treating Provider/Extender: Loreen Freud in Treatment: 10 Active Inactive Pain, Acute or Chronic Nursing Diagnoses: Pain, acute or chronic: actual or potential Potential alteration in comfort, pain Goals: Patient will verbalize adequate pain control and receive  pain control interventions during procedures as needed Date Initiated: 08/06/2021 Date Inactivated: 10/01/2021 Target Resolution Date: 10/04/2021 Goal Status: Met Patient/caregiver will verbalize comfort level met Date Initiated: 08/06/2021 Target Resolution Date: 12/13/2021 Goal Status: Active Interventions: Encourage patient to take pain medications as prescribed Provide education on pain management Reposition patient for comfort Treatment Activities: Administer pain control measures as ordered : 08/06/2021 Notes: Wound/Skin Impairment Nursing Diagnoses: Knowledge deficit related to ulceration/compromised skin integrity Goals: Patient/caregiver will verbalize understanding of skin care regimen Date Initiated: 08/06/2021 Target Resolution Date: 12/13/2021 Goal Status: Active Interventions: Assess patient/caregiver ability to obtain necessary supplies Assess patient/caregiver ability to perform ulcer/skin care regimen upon admission and as needed Provide education on ulcer and skin care Treatment Activities: Skin care regimen initiated : 08/06/2021 Topical wound management initiated : 08/06/2021 Notes: Electronic Signature(s) Signed: 10/17/2021 4:18:35 PM By: Deon Pilling RN, BSN Entered By: Deon Pilling on 10/17/2021 09:42:36 -------------------------------------------------------------------------------- Pain Assessment Details Patient Name: Date of  Service: [name], [name] 10/17/2021 9:00 A M Medical Record Number: [id number] Patient Account Number: [id number] Date of Birth/Sex: Treating RN: 1945-05-09 (76 y.o. [name] Primary Care Aalyah Mansouri: [name] Other Clinician: Referring Rhiley Solem: Treating Meryl Ponder/Extender: Loreen Freud in Treatment: 10 Active Problems Location of Pain Severity and Description of Pain Patient Has Paino No Site Locations Rate the pain. Current Pain Level: 0 Pain Management and Medication Current Pain  Management: Medication: No Cold Application: No Rest: No Massage: No Activity: No T.E.N.S.: No Heat Application: No Leg drop or elevation: No Is the Current Pain Management Adequate: Adequate How does your wound impact your activities of daily livingo Sleep: No Bathing: No Appetite: No Relationship With Others: No Bladder Continence: No Emotions: No Bowel Continence: No Work: No Toileting: No Drive: No Dressing: No Hobbies: No Notes per patient early am pain. Electronic Signature(s) Signed: 10/17/2021 4:18:35 PM By: Deon Pilling RN, BSN Entered By: Deon Pilling on 10/17/2021 09:27:16 -------------------------------------------------------------------------------- Patient/Caregiver Education Details Patient Name: Date of Service: CO [name], [name] 7/6/2023andnbsp9:00 A M Medical Record Number: [id number] Patient Account Number: [id number] Date of Birth/Gender: Treating RN: 1945-08-10 (76 y.o. [name], [name] Primary Care Physician: [name] Other Clinician: Referring Physician: Treating Physician/Extender: Loreen Freud in Treatment: 10 Education Assessment Education Provided To: Patient Education Topics Provided Wound/Skin Impairment: Handouts: Skin Care Do's and Dont's Methods: Explain/Verbal Responses: Reinforcements needed Electronic Signature(s) Signed: 10/17/2021 4:18:35 PM By: Deon Pilling RN, BSN Entered By: Deon Pilling on 10/17/2021 09:42:47 -------------------------------------------------------------------------------- Wound Assessment Details Patient Name: Date of Service: [name], [name] 10/17/2021 9:00 A M Medical Record Number: [id number] Patient Account Number: [id number] Date of Birth/Sex: Treating RN: May 26, 1945 (76 y.o. [name], [name] Primary Care Temesgen Weightman: [name] Other Clinician: Referring Jesalyn Finazzo: Treating Lakya Schrupp/Extender: Loreen Freud in Treatment: 10 Wound  Status Wound Number: 1 Primary Etiology: Abrasion Wound Location: Left, Anterior Lower Leg Secondary Etiology: Lymphedema Wounding Event: Trauma Wound Status: Open Date Acquired: 05/06/2021 Comorbid History: Hypertension Weeks Of Treatment: 10 Clustered Wound: No Photos Wound Measurements Length: (cm) 1.5 Width: (cm) 1.7 Depth: (cm) 0.1 Area: (cm) 2.003 Volume: (cm) 0.2 % Reduction in Area: 39.3% % Reduction in Volume: 39.4% Epithelialization: Medium (34-66%) Tunneling: No Undermining: No Wound Description Classification: Full Thickness Without Exposed Support Structures Wound Margin: Distinct, outline attached Exudate Amount: Medium Exudate Type: Serosanguineous Exudate Color: red, brown Foul Odor After Cleansing: No Slough/Fibrino Yes Wound Bed Granulation Amount: Large (67-100%) Exposed Structure Granulation Quality: Red, Pink Fascia Exposed: No Necrotic Amount: Small (1-33%) Fat Layer (Subcutaneous Tissue) Exposed: Yes Necrotic Quality: Adherent Slough Tendon Exposed: No Muscle Exposed: No Joint Exposed: No Bone Exposed: No Treatment Notes Wound #1 (Lower Leg) Wound Laterality: Left, Anterior Cleanser Soap and Water Discharge Instruction: May shower and wash wound with dial antibacterial soap and water prior to dressing change. Peri-Wound Care Triamcinolone 15 (g) Discharge Instruction: Use triamcinolone 15 (g) as directed Sween Lotion (Moisturizing lotion) Discharge Instruction: Apply moisturizing lotion as directed Topical keystone topical compounding antibiotics Discharge Instruction: will apply mupirocin in clinic and will use compounding antibiotics once it arrives at next appt time. Primary Dressing PolyMem Silver Non-Adhesive Dressing, 4.25x4.25 in Discharge Instruction: Apply to wound bed as instructed Secondary Dressing Zetuvit Plus 4x8 in Discharge Instruction: Apply over primary dressing as directed. Secured With Compression  Wrap FourPress (4 layer compression wrap) Discharge Instruction: Apply four layer compression as directed. May also use  Miliken CoFlex 2 layer compression system as alternative. Compression Stockings Add-Ons Electronic Signature(s) Signed: 10/17/2021 4:18:35 PM By: Deon Pilling RN, BSN Signed: 10/17/2021 4:18:35 PM By: Deon Pilling RN, BSN Entered By: Deon Pilling on 10/17/2021 09:37:42 -------------------------------------------------------------------------------- Vitals Details Patient Name: Date of Service: [name], [name] 10/17/2021 9:00 A M Medical Record Number: [id number] Patient Account Number: [id number] Date of Birth/Sex: Treating RN: 10-06-1945 (76 y.o. [name], [name] Primary Care Provider: [name] Other Clinician: Referring Provider: Treating Provider/Extender: Loreen Freud in Treatment: 10 Vital Signs Time Taken: 09:20 Temperature (F): 98 Height (in): 63 Pulse (bpm): 96 Weight (lbs): 220 Respiratory Rate (breaths/min): 20 Body Mass Index (BMI): 39 Blood Pressure (mmHg): 116/82 Reference Range: 80 - 120 mg / dl Electronic Signature(s) Signed: 10/17/2021 4:18:35 PM By: Deon Pilling RN, BSN Entered By: Deon Pilling on 10/17/2021 09:27:01

## 2021-10-17 NOTE — Progress Notes (Signed)
[name], [name] ([id number]) Visit Report for 10/17/2021 Chief Complaint Document Details Patient Name: Date of Service: [name],  [name] 10/17/2021 9:00 A M Medical Record Number: [id number] Patient Account Number: [id number] Date of Birth/Sex: Treating RN: 09/21/1945 (76 y.o. [name], [name] Primary Care Provider: Howie [name] Other Clinician: Referring Provider: Treating Provider/Extender: Loreen [name] in Treatment: 10 Information Obtained from: Patient Chief Complaint 08/06/2021; left lower extremity wound following trauma Electronic Signature(s) Signed: 10/17/2021 12:03:16 PM By: Jessica Shan DO Entered By: [name] on 10/17/2021 11:04:15 -------------------------------------------------------------------------------- HPI Details Patient Name: Date of Service: [name], Jessica [name] 10/17/2021 9:00 A M Medical Record Number: [id number] Patient Account Number: [id number] Date of Birth/Sex: Treating RN: [birth year] (76 y.o. [name] Primary Care Provider: Howie [name] Other Clinician: Referring Provider: Treating Provider/Extender: Loreen [name] in Treatment: 10 History of Present Illness HPI Description: Admission/20 08/2021 [name] is a 76 year old female with a past medical history of breast cancer status post bilateral mastectomy that presents the clinic for a 15-monthhistory of nonhealing ulcer to the left lower extremity. On 05/06/2021 while at work she hit her leg against the corner of a desk experiencing a decent laceration. She went to the ED and had sutures placed. These have since been removed. She has been on 2 rounds of antibiotics including Keflex and Bactrim for this issue. She reports chronic pain to the wound site. She has been using Xeroform with dressing changes. She denies purulent drainage. 5/1; patient presents for follow-up. She started using gentamicin ointment and Hydrofera Blue  over the past week. She reports improvement in her chronic pain. She has compression stockings but it is unclear if she is wearing these daily. She currently denies systemic signs of infection. She had a PCR culture done at last clinic visit that showed high levels of Staph aureus. Keystone antibiotics has been ordered and she states she is receiving this soon. 5/9; patient presents for follow-up. She tolerated the compression wrap well. We have been using gentamicin and Hydrofera Blue under the wrap. She is still not obtained her Keystone antibiotics. She states she will try to get this soon. She denies signs of infection. 5/16; patient presents for follow-up. She states she is obtaining Keystone antibiotics today. She has no issues or complaints today. She tolerated the compression wrap well. We did in office ABIs and they were 0.97 on the left. 5/23; patient presents for follow-up. She has her Keystone antibiotics with her today. She has no issues or complaints. She tolerated the compression wrap well. 5/30; patient presents for follow-up. We have been using Keystone antibiotic with Sorbact under compression wrap. She has no issues or complaints today. She denies signs of infection. 6/6; Keystone with Iodoflex as of last week. Per our intake nurse everything looks a little better she is using 3 layer compression 6/13; patient presents for follow-up. She has been using Keystone with Iodoflex under compression. She was increased to 4 layer at last clinic visit. 6/22; patient presents for follow-up. We have been using Keystone antibiotics with Iodoflex under compression. She has no issues or complaints today. 6/27; patient presents for follow-up. We have been using Keystone antibiotics with PolyMem silver under compression for the past week. She has no issues or complaints today. 7/6; patient presents for follow-up. We have been using Keystone antibiotic with PolyMem silver under the compression wrap  with no issues. Patient reports improvement in wound healing. Electronic  Signature(s) Signed: 10/17/2021 12:03:16 PM By: Jessica Shan DO Entered By: [name] on 10/17/2021 11:04:44 -------------------------------------------------------------------------------- Physical Exam Details Patient Name: Date of Service: Jessica [name], Jessica [name] 10/17/2021 9:00 A M Medical Record Number: [id number] Patient Account Number: [id number] Date of Birth/Sex: Treating RN: 1945/09/20 (76 y.o. [name] Primary Care Provider: Howie [name] Other Clinician: Referring Provider: Treating Provider/Extender: Loreen [name] in Treatment: 10 Constitutional respirations regular, non-labored and within target range for patient.. Cardiovascular 2+ dorsalis pedis/posterior tibialis pulses. Psychiatric pleasant and cooperative. Notes T the anterior aspect there is an open wound with granulation tissue and tightly adhered fibrinous tissue. No signs of surrounding infection. Good edema o control. Electronic Signature(s) Signed: 10/17/2021 12:03:16 PM By: Jessica Shan DO Entered By: [name] on 10/17/2021 11:05:30 -------------------------------------------------------------------------------- Physician Orders Details Patient Name: Date of Service: [name], Jessica [name] 10/17/2021 9:00 A M Medical Record Number: [id number] Patient Account Number: [id number] Date of Birth/Sex: Treating RN: 08-Jul-1945 (76 y.o. [name] Primary Care Provider: Howie [name] Other Clinician: Referring Provider: Treating Provider/Extender: Loreen [name] in Treatment: 10 Verbal / Phone Orders: No Diagnosis Coding ICD-10 Coding Code Description [phone] Non-pressure chronic ulcer of other part of left lower leg with fat layer exposed T79.9XXA Unspecified early complication of trauma, initial encounter I10 Essential (primary) hypertension I89.0  Lymphedema, not elsewhere classified I87.2 Venous insufficiency (chronic) (peripheral) C50.919 Malignant neoplasm of unspecified site of unspecified female breast Follow-up Appointments ppointment in 1 week. - Dr. Heber [name] and [place], Room 8 Tuesday 10/22/2021 0900 Return A ppointment in 2 weeks. - Dr. Heber  and [place], Room 8 Tuesday 10/29/2021 0900 Return A Other: - [employer]- will send out wound care supplies Keystone topical antibiotic bring in at each appt time. Bathing/ Shower/ Hygiene May shower and wash wound with soap and water. Edema Control - Lymphedema / SCD / Other Elevate legs to the level of the heart or above for 30 minutes daily and/or when sitting, a frequency of: - 3-4 times a day throughout the day. Avoid standing for long periods of time. Exercise regularly Moisturize legs daily. - both legs every night before bed. Wound Treatment Wound #1 - Lower Leg Wound Laterality: Left, Anterior Cleanser: Soap and Water 1 x Per Week/30 Days Discharge Instructions: May shower and wash wound with dial antibacterial soap and water prior to dressing change. Peri-Wound Care: Triamcinolone 15 (g) 1 x Per Week/30 Days Discharge Instructions: Use triamcinolone 15 (g) as directed Peri-Wound Care: Sween Lotion (Moisturizing lotion) 1 x Per Week/30 Days Discharge Instructions: Apply moisturizing lotion as directed Topical: keystone topical compounding antibiotics 1 x Per Week/30 Days Discharge Instructions: will apply mupirocin in clinic and will use compounding antibiotics once it arrives at next appt time. Prim Dressing: PolyMem Silver Non-Adhesive Dressing, 4.25x4.25 in 1 x Per Week/30 Days ary Discharge Instructions: Apply to wound bed as instructed Secondary Dressing: Zetuvit Plus 4x8 in 1 x Per Week/30 Days Discharge Instructions: Apply over primary dressing as directed. Compression Wrap: FourPress (4 layer compression wrap) 1 x Per Week/30 Days Discharge Instructions: Apply  four layer compression as directed. May also use Miliken CoFlex 2 layer compression system as alternative. Electronic Signature(s) Signed: 10/17/2021 12:03:16 PM By: Jessica Shan DO Entered By: [name] on 10/17/2021 11:05:40 -------------------------------------------------------------------------------- Problem List Details Patient Name: Date of Service: [name], Jessica [name] 10/17/2021 9:00 A M Medical Record Number: [id number] Patient Account Number: [id number] Date of Birth/Sex: Treating RN: 04/12/1946 (  76 y.o. [name] Primary Care Provider: Howie [name] Other Clinician: Referring Provider: Treating Provider/Extender: Loreen [name] in Treatment: 10 Active Problems ICD-10 Encounter Code Description Active Date MDM Diagnosis [phone] Non-pressure chronic ulcer of other part of left lower leg with fat layer exposed5/12/2021 No Yes T79.9XXA Unspecified early complication of trauma, initial encounter 08/06/2021 No Yes I10 Essential (primary) hypertension 08/06/2021 No Yes I89.0 Lymphedema, not elsewhere classified 08/06/2021 No Yes I87.2 Venous insufficiency (chronic) (peripheral) 08/06/2021 No Yes C50.919 Malignant neoplasm of unspecified site of unspecified female breast 08/06/2021 No Yes Inactive Problems Resolved Problems Electronic Signature(s) Signed: 10/17/2021 12:03:16 PM By: Jessica Shan DO Entered By: [name] on 10/17/2021 11:04:04 -------------------------------------------------------------------------------- Progress Note Details Patient Name: Date of Service: [name], Jessica [name] 10/17/2021 9:00 A M Medical Record Number: [id number] Patient Account Number: [id number] Date of Birth/Sex: Treating RN: Nov 01, 1945 (76 y.o. [name] Primary Care Provider: Howie [name] Other Clinician: Referring Provider: Treating Provider/Extender: Loreen [name] in Treatment: 10 Subjective Chief  Complaint Information obtained from Patient 08/06/2021; left lower extremity wound following trauma History of Present Illness (HPI) Admission/20 08/2021 Ms. Meghen Akopyan is a 76 year old female with a past medical history of breast cancer status post bilateral mastectomy that presents the clinic for a 67-monthhistory of nonhealing ulcer to the left lower extremity. On 05/06/2021 while at work she hit her leg against the corner of a desk experiencing a decent laceration. She went to the ED and had sutures placed. These have since been removed. She has been on 2 rounds of antibiotics including Keflex and Bactrim for this issue. She reports chronic pain to the wound site. She has been using Xeroform with dressing changes. She denies purulent drainage. 5/1; patient presents for follow-up. She started using gentamicin ointment and Hydrofera Blue over the past week. She reports improvement in her chronic pain. She has compression stockings but it is unclear if she is wearing these daily. She currently denies systemic signs of infection. She had a PCR culture done at last clinic visit that showed high levels of Staph aureus. Keystone antibiotics has been ordered and she states she is receiving this soon. 5/9; patient presents for follow-up. She tolerated the compression wrap well. We have been using gentamicin and Hydrofera Blue under the wrap. She is still not obtained her Keystone antibiotics. She states she will try to get this soon. She denies signs of infection. 5/16; patient presents for follow-up. She states she is obtaining Keystone antibiotics today. She has no issues or complaints today. She tolerated the compression wrap well. We did in office ABIs and they were 0.97 on the left. 5/23; patient presents for follow-up. She has her Keystone antibiotics with her today. She has no issues or complaints. She tolerated the compression wrap well. 5/30; patient presents for follow-up. We have been using  Keystone antibiotic with Sorbact under compression wrap. She has no issues or complaints today. She denies signs of infection. 6/6; Keystone with Iodoflex as of last week. Per our intake nurse everything looks a little better she is using 3 layer compression 6/13; patient presents for follow-up. She has been using Keystone with Iodoflex under compression. She was increased to 4 layer at last clinic visit. 6/22; patient presents for follow-up. We have been using Keystone antibiotics with Iodoflex under compression. She has no issues or complaints today. 6/27; patient presents for follow-up. We have been using Keystone antibiotics with PolyMem silver under compression  for the past week. She has no issues or complaints today. 7/6; patient presents for follow-up. We have been using Keystone antibiotic with PolyMem silver under the compression wrap with no issues. Patient reports improvement in wound healing. Patient History Family History Cancer - Father, Diabetes - Father, Heart Disease - Father, Hypertension - Father. Social History Former smoker - quit 1998, Marital Status - Married, Alcohol Use - Never, Drug Use - No History, Caffeine Use - Never. Medical History Cardiovascular Patient has history of Hypertension Hospitalization/Surgery History - eyes surgery. - tonsillectomy. - hysterectomy. - bilateral mastectomy. - deviated septum from a pear falling from a tree.. Medical A Surgical History Notes nd Constitutional Symptoms (General Health) Bilateral mastectomy Breast Cancer Vitamin D deficiency Cardiovascular Ventricular tachycardia Objective Constitutional respirations regular, non-labored and within target range for patient.. Vitals Time Taken: 9:20 AM, Height: 63 in, Weight: 220 lbs, BMI: 39, Temperature: 98 F, Pulse: 96 bpm, Respiratory Rate: 20 breaths/min, Blood Pressure: 116/82 mmHg. Cardiovascular 2+ dorsalis pedis/posterior tibialis pulses. Psychiatric pleasant and  cooperative. General Notes: T the anterior aspect there is an open wound with granulation tissue and tightly adhered fibrinous tissue. No signs of surrounding infection. o Good edema control. Integumentary (Hair, Skin) Wound #1 status is Open. Original cause of wound was Trauma. The date acquired was: 05/06/2021. The wound has been in treatment 10 weeks. The wound is located on the Left,Anterior Lower Leg. The wound measures 1.5cm length x 1.7cm width x 0.1cm depth; 2.003cm^2 area and 0.2cm^3 volume. There is Fat Layer (Subcutaneous Tissue) exposed. There is no tunneling or undermining noted. There is a medium amount of serosanguineous drainage noted. The wound margin is distinct with the outline attached to the wound base. There is large (67-100%) red, pink granulation within the wound bed. There is a small (1-33%) amount of necrotic tissue within the wound bed including Adherent Slough. Assessment Active Problems ICD-10 Non-pressure chronic ulcer of other part of left lower leg with fat layer exposed Unspecified early complication of trauma, initial encounter Essential (primary) hypertension Lymphedema, not elsewhere classified Venous insufficiency (chronic) (peripheral) Malignant neoplasm of unspecified site of unspecified female breast Patient's wound has shown improvement in size and appearance since last clinic visit. No need for debridement today. I recommended continuing PolyMem silver And Keystone antibiotic under 4-layer compression. Follow-up in 1 week. Procedures Wound #1 Pre-procedure diagnosis of Wound #1 is an Abrasion located on the Left,Anterior Lower Leg . There was a Double Layer Compression Therapy Procedure by Deon Pilling, RN. Post procedure Diagnosis Wound #1: Same as Pre-Procedure Plan Follow-up Appointments: Return Appointment in 1 week. - Dr. Heber [place] and [place], Room 8 Tuesday 10/22/2021 0900 Return Appointment in 2 weeks. - Dr. Heber [place] and [place], Room 8 Tuesday  10/29/2021 0900 Other: - [employer]- will send out wound care supplies Keystone topical antibiotic bring in at each appt time. Bathing/ Shower/ Hygiene: May shower and wash wound with soap and water. Edema Control - Lymphedema / SCD / Other: Elevate legs to the level of the heart or above for 30 minutes daily and/or when sitting, a frequency of: - 3-4 times a day throughout the day. Avoid standing for long periods of time. Exercise regularly Moisturize legs daily. - both legs every night before bed. WOUND #1: - Lower Leg Wound Laterality: Left, Anterior Cleanser: Soap and Water 1 x Per Week/30 Days Discharge Instructions: May shower and wash wound with dial antibacterial soap and water prior to dressing change. Peri-Wound Care: Triamcinolone 15 (g) 1 x  Per Week/30 Days Discharge Instructions: Use triamcinolone 15 (g) as directed Peri-Wound Care: Sween Lotion (Moisturizing lotion) 1 x Per Week/30 Days Discharge Instructions: Apply moisturizing lotion as directed Topical: keystone topical compounding antibiotics 1 x Per Week/30 Days Discharge Instructions: will apply mupirocin in clinic and will use compounding antibiotics once it arrives at next appt time. Prim Dressing: PolyMem Silver Non-Adhesive Dressing, 4.25x4.25 in 1 x Per Week/30 Days ary Discharge Instructions: Apply to wound bed as instructed Secondary Dressing: Zetuvit Plus 4x8 in 1 x Per Week/30 Days Discharge Instructions: Apply over primary dressing as directed. Com pression Wrap: FourPress (4 layer compression wrap) 1 x Per Week/30 Days Discharge Instructions: Apply four layer compression as directed. May also use Miliken CoFlex 2 layer compression system as alternative. 1. PolyMem silver with Keystone under 4-layer compression 2. Follow-up in 1 week Electronic Signature(s) Signed: 10/17/2021 12:03:16 PM By: Jessica Shan DO Entered By: [name] on 10/17/2021  11:06:30 -------------------------------------------------------------------------------- HxROS Details Patient Name: Date of Service: [name], Jessica [name] 10/17/2021 9:00 A M Medical Record Number: [id number] Patient Account Number: [id number] Date of Birth/Sex: Treating RN: 06-02-1945 (76 y.o. [name], [name] Primary Care Provider: Howie [name] Other Clinician: Referring Provider: Treating Provider/Extender: Loreen [name] in Treatment: 10 Constitutional Symptoms (General Health) Medical History: Past Medical History Notes: Bilateral mastectomy Breast Cancer Vitamin D deficiency Cardiovascular Medical History: Positive for: Hypertension Past Medical History Notes: Ventricular tachycardia Immunizations Pneumococcal Vaccine: Received Pneumococcal Vaccination: No Implantable Devices No devices added Hospitalization / Surgery History Type of Hospitalization/Surgery eyes surgery tonsillectomy hysterectomy bilateral mastectomy deviated septum from a pear falling from a tree. Family and Social History Cancer: Yes - Father; Diabetes: Yes - Father; Heart Disease: Yes - Father; Hypertension: Yes - Father; Former smoker - quit 1998; Marital Status - Married; Alcohol Use: Never; Drug Use: No History; Caffeine Use: Never; Financial Concerns: No; Food, Clothing or Shelter Needs: No; Support System Lacking: No; Transportation Concerns: No Electronic Signature(s) Signed: 10/17/2021 12:03:16 PM By: Jessica Shan DO Signed: 10/17/2021 4:18:35 PM By: Deon Pilling RN, BSN Entered By: [name] on 10/17/2021 11:04:49 -------------------------------------------------------------------------------- SuperBill Details Patient Name: Date of Service: Jessica [name], Jessica [name] 10/17/2021 Medical Record Number: [id number] Patient Account Number: [id number] Date of Birth/Sex: Treating RN: October 10, 1945 (76 y.o. [name] Primary Care Provider: Howie [name] Other Clinician: Referring Provider: Treating Provider/Extender: Loreen [name] in Treatment: 10 Diagnosis Coding ICD-10 Codes Code Description [phone] Non-pressure chronic ulcer of other part of left lower leg with fat layer exposed T79.9XXA Unspecified early complication of trauma, initial encounter I10 Essential (primary) hypertension I89.0 Lymphedema, not elsewhere classified I87.2 Venous insufficiency (chronic) (peripheral) C50.919 Malignant neoplasm of unspecified site of unspecified female breast Facility Procedures CPT4 Code: [id number] ( Description: Facility Use Only) [id number] - [facility] LWR LT LEG Modifier: Quantity: 1 Physician Procedures : CPT4 Code Description Modifier [id number] - WC PHYS LEVEL 3 - EST PT ICD-10 Diagnosis Description L97.822 Non-pressure chronic ulcer of other part of left lower leg with fat layer exposed T79.9XXA Unspecified early complication of trauma, initial  encounter I89.0 Lymphedema, not elsewhere classified I87.2 Venous insufficiency (chronic) (peripheral) Quantity: 1 Electronic Signature(s) Signed: 10/17/2021 12:03:16 PM By: Jessica Shan DO Entered By: [name] on 10/17/2021 11:24:51

## 2021-10-22 ENCOUNTER — Encounter (HOSPITAL_BASED_OUTPATIENT_CLINIC_OR_DEPARTMENT_OTHER): Payer: No Typology Code available for payment source | Admitting: Internal Medicine

## 2021-10-22 DIAGNOSIS — L97822 Non-pressure chronic ulcer of other part of left lower leg with fat layer exposed: Secondary | ICD-10-CM | POA: Diagnosis not present

## 2021-10-22 DIAGNOSIS — I890 Lymphedema, not elsewhere classified: Secondary | ICD-10-CM | POA: Diagnosis not present

## 2021-10-22 DIAGNOSIS — T799XXA Unspecified early complication of trauma, initial encounter: Secondary | ICD-10-CM | POA: Diagnosis not present

## 2021-10-22 DIAGNOSIS — I872 Venous insufficiency (chronic) (peripheral): Secondary | ICD-10-CM | POA: Diagnosis not present

## 2021-10-22 NOTE — Progress Notes (Signed)
[name], [name] ([id number]) Visit Report for 10/22/2021 Arrival Information Details Patient Name: Date of Service: [name], [name] 10/22/2021 9:00 A M Medical Record Number: [id number] Patient Account Number: [id number] Date of Birth/Sex: Treating RN: March 08, 1946 (76 y.o. [name], [name] Primary Care Muneeb Veras: [name] Other Clinician: Referring [name]: Treating Phelan Schadt/Extender: Loreen Freud in Treatment: 11 Visit Information History Since Last Visit Added or deleted any medications: No Patient Arrived: [name] Any new allergies or adverse reactions: No Arrival Time: 09:18 Had a fall or experienced change in No Accompanied By: self activities of daily living that may affect Transfer Assistance: None risk of falls: Patient Identification Verified: Yes Signs or symptoms of abuse/neglect since last visito No Secondary Verification Process Completed: Yes Hospitalized since last visit: No Patient Requires Transmission-Based Precautions: No Implantable device outside of the clinic excluding No Patient Has Alerts: No cellular tissue based products placed in the center since last visit: Has Dressing in Place as Prescribed: Yes Has Compression in Place as Prescribed: Yes Pain Present Now: No Electronic Signature(s) Signed: 10/22/2021 6:35:30 PM By: Deon Pilling RN, BSN Entered By: Deon Pilling on 10/22/2021 10:37:34 -------------------------------------------------------------------------------- Compression Therapy Details Patient Name: Date of Service: Jessica [name], [name] 10/22/2021 9:00 A M Medical Record Number: [id number] Patient Account Number: [id number] Date of Birth/Sex: Treating RN: 1945/07/18 (76 y.o. [name], [name] Primary Care Enriqueta Augusta: [name] Other Clinician: Referring Kden Wagster: Treating Yzabelle Calles/Extender: Loreen Freud in Treatment: 11 Compression Therapy Performed for Wound Assessment:  Wound #1 Left,Anterior Lower Leg Performed By: Clinician Deon Pilling, RN Compression Type: Double Layer Post Procedure Diagnosis Same as Pre-procedure Electronic Signature(s) Signed: 10/22/2021 6:35:30 PM By: Deon Pilling RN, BSN Entered By: Deon Pilling on 10/22/2021 18:11:38 -------------------------------------------------------------------------------- Encounter Discharge Information Details Patient Name: Date of Service: Jessica [name], [name] 10/22/2021 9:00 A M Medical Record Number: [id number] Patient Account Number: [id number] Date of Birth/Sex: Treating RN: August 03, 1945 (76 y.o. [name], [name] Primary Care Samarah Hogle: [name] Other Clinician: Referring Amarys Sliwinski: Treating Idus Rathke/Extender: Loreen Freud in Treatment: 11 Encounter Discharge Information Items Discharge Condition: Stable Ambulatory Status: Cane Discharge Destination: Home Transportation: Private Auto Accompanied By: self Schedule Follow-up Appointment: Yes Clinical Summary of Care: Electronic Signature(s) Signed: 10/22/2021 6:35:30 PM By: Deon Pilling RN, BSN Entered By: Deon Pilling on 10/22/2021 18:13:21 -------------------------------------------------------------------------------- Lower Extremity Assessment Details Patient Name: Date of Service: Jessica [name], [name] 10/22/2021 9:00 A M Medical Record Number: [id number] Patient Account Number: [id number] Date of Birth/Sex: Treating RN: Jun 27, 1945 (76 y.o. [name], [name] Primary Care Avina Eberle: [name] Other Clinician: Referring Keishon Chavarin: Treating Charlcie Prisco/Extender: Loreen Freud in Treatment: 11 Edema Assessment Assessed: [name]: Yes] [name]: No] Edema: [Left: N] [Right: o] Calf Left: Right: Point of Measurement: 30 cm From Medial Instep 41 cm Ankle Left: Right: Point of Measurement: 9 cm From Medial Instep 26 cm Vascular Assessment Pulses: Dorsalis Pedis Palpable:  [Left:Yes] Electronic Signature(s) Signed: 10/22/2021 6:35:30 PM By: Deon Pilling RN, BSN Entered By: Deon Pilling on 10/22/2021 10:38:15 -------------------------------------------------------------------------------- Multi Wound Chart Details Patient Name: Date of Service: Jessica [name], [name] 10/22/2021 9:00 A M Medical Record Number: [id number] Patient Account Number: [id number] Date of Birth/Sex: Treating RN: 11/23/1945 (76 y.o. [name] Primary Care Chyanne Kohut: [name] Other Clinician: Referring Namira Rosekrans: Treating Hiawatha Dressel/Extender: Loreen Freud in Treatment: 11 Vital Signs Height(in): 63 Pulse(bpm): 103 Weight(lbs): 220 Blood  Pressure(mmHg): 106/89 Body Mass Index(BMI): 39 Temperature(F): 98.3 Respiratory Rate(breaths/min): 16 Photos: [1:No Photos Left, Anterior Lower Leg] [N/A:N/A N/A] Wound Location: [1:Trauma] [N/A:N/A] Wounding Event: [1:Abrasion] [N/A:N/A] Primary Etiology: [1:Lymphedema] [N/A:N/A] Secondary Etiology: [1:Hypertension] [N/A:N/A] Comorbid History: [1:05/06/2021] [N/A:N/A] Date Acquired: [1:11] [N/A:N/A] Weeks of Treatment: [1:Open] [N/A:N/A] Wound Status: [1:No] [N/A:N/A] Wound Recurrence: [1:1.2x1.9x0.1] [N/A:N/A] Measurements L x W x D (cm) [1:1.791] [N/A:N/A] A (cm) : rea [1:0.179] [N/A:N/A] Volume (cm) : [1:45.70%] [N/A:N/A] % Reduction in A rea: [1:45.80%] [N/A:N/A] % Reduction in Volume: [1:Full Thickness Without Exposed] [N/A:N/A] Classification: [1:Support Structures Medium] [N/A:N/A] Exudate Amount: [1:Serosanguineous] [N/A:N/A] Exudate Type: [1:red, brown] [N/A:N/A] Exudate Color: [1:Distinct, outline attached] [N/A:N/A] Wound Margin: [1:Large (67-100%)] [N/A:N/A] Granulation Amount: [1:Red, Pink] [N/A:N/A] Granulation Quality: [1:None Present (0%)] [N/A:N/A] Necrotic Amount: [1:Fat Layer (Subcutaneous Tissue): Yes N/A] Exposed Structures: [1:Fascia: No Tendon: No Muscle: No Joint: No  Bone: No Medium (34-66%)] [N/A:N/A] Treatment Notes Electronic Signature(s) Signed: 10/22/2021 2:06:16 PM By: Kalman Shan DO Signed: 10/22/2021 6:35:30 PM By: Deon Pilling RN, BSN Entered By: Kalman Shan on 10/22/2021 11:32:19 -------------------------------------------------------------------------------- Multi-Disciplinary Care Plan Details Patient Name: Date of Service: Jessica [name], [name] 10/22/2021 9:00 A M Medical Record Number: [id number] Patient Account Number: [id number] Date of Birth/Sex: Treating RN: 28-Feb-1946 (76 y.o. [name], [name] Primary Care Josalyn Dettmann: [name] Other Clinician: Referring Johan Creveling: Treating Caramia Boutin/Extender: Loreen Freud in Treatment: 11 Active Inactive Pain, Acute or Chronic Nursing Diagnoses: Pain, acute or chronic: actual or potential Potential alteration in comfort, pain Goals: Patient will verbalize adequate pain control and receive pain control interventions during procedures as needed Date Initiated: 08/06/2021 Date Inactivated: 10/01/2021 Target Resolution Date: 10/04/2021 Goal Status: Met Patient/caregiver will verbalize comfort level met Date Initiated: 08/06/2021 Target Resolution Date: 12/13/2021 Goal Status: Active Interventions: Encourage patient to take pain medications as prescribed Provide education on pain management Reposition patient for comfort Treatment Activities: Administer pain control measures as ordered : 08/06/2021 Notes: Wound/Skin Impairment Nursing Diagnoses: Knowledge deficit related to ulceration/compromised skin integrity Goals: Patient/caregiver will verbalize understanding of skin care regimen Date Initiated: 08/06/2021 Target Resolution Date: 12/13/2021 Goal Status: Active Interventions: Assess patient/caregiver ability to obtain necessary supplies Assess patient/caregiver ability to perform ulcer/skin care regimen upon admission and as needed Provide education  on ulcer and skin care Treatment Activities: Skin care regimen initiated : 08/06/2021 Topical wound management initiated : 08/06/2021 Notes: Electronic Signature(s) Signed: 10/22/2021 6:35:30 PM By: Deon Pilling RN, BSN Entered By: Deon Pilling on 10/22/2021 18:11:44 -------------------------------------------------------------------------------- Pain Assessment Details Patient Name: Date of Service: Jessica [name], [name] 10/22/2021 9:00 A M Medical Record Number: [id number] Patient Account Number: [id number] Date of Birth/Sex: Treating RN: Dec 10, 1945 (76 y.o. [name] Primary Care Zaray Gatchel: [name] Other Clinician: Referring Karol Liendo: Treating Jerelene Salaam/Extender: Loreen Freud in Treatment: 11 Active Problems Location of Pain Severity and Description of Pain Patient Has Paino No Site Locations Rate the pain. Current Pain Level: 0 Pain Management and Medication Current Pain Management: Medication: No Cold Application: No Rest: No Massage: No Activity: No T.E.N.S.: No Heat Application: No Leg drop or elevation: No Is the Current Pain Management Adequate: Adequate How does your wound impact your activities of daily livingo Sleep: No Bathing: No Appetite: No Relationship With Others: No Bladder Continence: No Emotions: No Bowel Continence: No Work: No Toileting: No Drive: No Dressing: No Hobbies: No Engineer, maintenance) Signed: 10/22/2021 6:35:30 PM By: Deon Pilling RN, BSN Entered By: Deon Pilling on 10/22/2021 10:38:01 -------------------------------------------------------------------------------- Patient/Caregiver  Education Details Patient Name: Date of Service: Marylene Land, [place] [name] 7/11/2023andnbsp9:00 A M Medical Record Number: [id number] Patient Account Number: [id number] Date of Birth/Gender: Treating RN: 1946-03-31 (76 y.o. [name], [name] Primary Care Physician: [name] Other Clinician: Referring  Physician: Treating Physician/Extender: Loreen Freud in Treatment: 11 Education Assessment Education Provided To: Patient Education Topics Provided Wound/Skin Impairment: Handouts: Skin Care Do's and Dont's Methods: Explain/Verbal Responses: Reinforcements needed Electronic Signature(s) Signed: 10/22/2021 6:35:30 PM By: Deon Pilling RN, BSN Entered By: Deon Pilling on 10/22/2021 18:11:58 -------------------------------------------------------------------------------- Wound Assessment Details Patient Name: Date of Service: Jessica [name], [name] 10/22/2021 9:00 A M Medical Record Number: [id number] Patient Account Number: [id number] Date of Birth/Sex: Treating RN: 1946/02/21 (76 y.o. [name], [name] Primary Care Woodie Degraffenreid: [name] Other Clinician: Referring Beren Yniguez: Treating Keisy Strickler/Extender: Loreen Freud in Treatment: 11 Wound Status Wound Number: 1 Primary Etiology: Abrasion Wound Location: Left, Anterior Lower Leg Secondary Etiology: Lymphedema Wounding Event: Trauma Wound Status: Open Date Acquired: 05/06/2021 Comorbid History: Hypertension Weeks Of Treatment: 11 Clustered Wound: No Wound Measurements Length: (cm) 1.2 Width: (cm) 1.9 Depth: (cm) 0.1 Area: (cm) 1.791 Volume: (cm) 0.179 % Reduction in Area: 45.7% % Reduction in Volume: 45.8% Epithelialization: Medium (34-66%) Tunneling: No Undermining: No Wound Description Classification: Full Thickness Without Exposed Support Structures Wound Margin: Distinct, outline attached Exudate Amount: Medium Exudate Type: Serosanguineous Exudate Color: red, brown Foul Odor After Cleansing: No Slough/Fibrino No Wound Bed Granulation Amount: Large (67-100%) Exposed Structure Granulation Quality: Red, Pink Fascia Exposed: No Necrotic Amount: None Present (0%) Fat Layer (Subcutaneous Tissue) Exposed: Yes Tendon Exposed: No Muscle Exposed: No Joint  Exposed: No Bone Exposed: No Treatment Notes Wound #1 (Lower Leg) Wound Laterality: Left, Anterior Cleanser Soap and Water Discharge Instruction: May shower and wash wound with dial antibacterial soap and water prior to dressing change. Peri-Wound Care Triamcinolone 15 (g) Discharge Instruction: Use triamcinolone 15 (g) as directed Sween Lotion (Moisturizing lotion) Discharge Instruction: Apply moisturizing lotion as directed Topical keystone topical compounding antibiotics Discharge Instruction: will apply mupirocin in clinic and will use compounding antibiotics once it arrives at next appt time. Primary Dressing PolyMem Silver Non-Adhesive Dressing, 4.25x4.25 in Discharge Instruction: Apply to wound bed as instructed Secondary Dressing Zetuvit Plus 4x8 in Discharge Instruction: Apply over primary dressing as directed. Secured With Compression Wrap FourPress (4 layer compression wrap) Discharge Instruction: Apply four layer compression as directed. May also use Miliken CoFlex 2 layer compression system as alternative. Compression Stockings Add-Ons Electronic Signature(s) Signed: 10/22/2021 6:35:30 PM By: Deon Pilling RN, BSN Entered By: Deon Pilling on 10/22/2021 10:38:32 -------------------------------------------------------------------------------- Vitals Details Patient Name: Date of Service: Jessica [name], [name] 10/22/2021 9:00 A M Medical Record Number: [id number] Patient Account Number: [id number] Date of Birth/Sex: Treating RN: 09/17/1945 (76 y.o. [name], [name] Primary Care Keivon Garden: [name] Other Clinician: Referring Jolonda Gomm: Treating Holbert Caples/Extender: Loreen Freud in Treatment: 11 Vital Signs Time Taken: 09:18 Temperature (F): 98.3 Height (in): 63 Pulse (bpm): 103 Weight (lbs): 220 Respiratory Rate (breaths/min): 16 Body Mass Index (BMI): 39 Blood Pressure (mmHg): 106/89 Reference Range: 80 - 120 mg /  dl Electronic Signature(s) Signed: 10/22/2021 6:35:30 PM By: Deon Pilling RN, BSN Entered By: Deon Pilling on 10/22/2021 10:37:54

## 2021-10-23 NOTE — Progress Notes (Signed)
TAUHEEDAH, BOK ([id number]) Visit Report for 10/22/2021 Chief Complaint Document Details Patient Name: Date of Service: [name], Alexander [name] 10/22/2021 9:00 A M Medical Record Number: [id number] Patient Account Number: [id number] Date of Birth/Sex: Treating RN: [birth year] (76 y.o. [name], [name] in Treatment: 11 Information Obtained from: Patient Chief Complaint 08/06/2021; left lower extremity wound following trauma Electronic Signature(s) Signed: 10/22/2021 2:06:16 PM By: Kalman Shan DO Entered By: Kalman Shan on 10/22/2021 11:32:27 -------------------------------------------------------------------------------- HPI Details Patient Name: Date of Service: [name], [name] 10/22/2021 9:00 A M Medical Record Number: [id number] Patient Account Number: [id number] Date of Birth/Sex: Treating RN: May 14, 1945 (76 y.o. [name] Primary Care Provider: [name] Other Clinician: Referring Provider: Treating Provider/Extender: Loreen Freud in Treatment: 11 History of Present Illness HPI Description: Admission/20 08/2021 Ms. [name] is a 75 year old female with a past medical history of breast cancer status post bilateral mastectomy that presents the clinic for a 92-monthhistory of nonhealing ulcer to the left lower extremity. On 05/06/2021 while at work she hit her leg against the corner of a desk experiencing a decent laceration. She went to the ED and had sutures placed. These have since been removed. She has been on 2 rounds of antibiotics including Keflex and Bactrim for this issue. She reports chronic pain to the wound site. She has been using Xeroform with dressing changes. She denies purulent drainage. 5/1; patient presents for follow-up. She started using gentamicin ointment and Hydrofera  Blue over the past week. She reports improvement in her chronic pain. She has compression stockings but it is unclear if she is wearing these daily. She currently denies systemic signs of infection. She had a PCR culture done at last clinic visit that showed high levels of Staph aureus. Keystone antibiotics has been ordered and she states she is receiving this soon. 5/9; patient presents for follow-up. She tolerated the compression wrap well. We have been using gentamicin and Hydrofera Blue under the wrap. She is still not obtained her Keystone antibiotics. She states she will try to get this soon. She denies signs of infection. 5/16; patient presents for follow-up. She states she is obtaining Keystone antibiotics today. She has no issues or complaints today. She tolerated the compression wrap well. We did in office ABIs and they were 0.97 on the left. 5/23; patient presents for follow-up. She has her Keystone antibiotics with her today. She has no issues or complaints. She tolerated the compression wrap well. 5/30; patient presents for follow-up. We have been using Keystone antibiotic with Sorbact under compression wrap. She has no issues or complaints today. She denies signs of infection. 6/6; Keystone with Iodoflex as of last week. Per our intake nurse everything looks a little better she is using 3 layer compression 6/13; patient presents for follow-up. She has been using Keystone with Iodoflex under compression. She was increased to 4 layer at last clinic visit. 6/22; patient presents for follow-up. We have been using Keystone antibiotics with Iodoflex under compression. She has no issues or complaints today. 6/27; patient presents for follow-up. We have been using Keystone antibiotics with PolyMem silver under compression for the past week. She has no issues or complaints today. 7/6; patient presents for follow-up. We have been using Keystone antibiotic with PolyMem silver under the compression  wrap with no issues. Patient reports improvement in wound healing. 7/11;  patient presents for follow-up. We have been using Keystone along with PolyMem silver under compression wrap. Patient has no issues or complaints today. Electronic Signature(s) Signed: 10/22/2021 2:06:16 PM By: Kalman Shan DO Entered By: Kalman Shan on 10/22/2021 11:32:47 -------------------------------------------------------------------------------- Physical Exam Details Patient Name: Date of Service: CO [name], [name] 10/22/2021 9:00 A M Medical Record Number: [id number] Patient Account Number: [id number] Date of Birth/Sex: Treating RN: 05-04-1945 (76 y.o. [name] Primary Care Provider: [name] Other Clinician: Referring Provider: Treating Provider/Extender: Loreen Freud in Treatment: 11 Constitutional respirations regular, non-labored and within target range for patient.. Cardiovascular 2+ dorsalis pedis/posterior tibialis pulses. Psychiatric pleasant and cooperative. Notes T the anterior aspect there is an open wound with granulation tissue and tightly adhered fibrinous tissue. No signs of surrounding infection. Good edema o control. Electronic Signature(s) Signed: 10/22/2021 2:06:16 PM By: Kalman Shan DO Entered By: Kalman Shan on 10/22/2021 11:33:09 -------------------------------------------------------------------------------- Physician Orders Details Patient Name: Date of Service: [name], [name] 10/22/2021 9:00 A M Medical Record Number: [id number] Patient Account Number: [id number] Date of Birth/Sex: Treating RN: 1946/03/22 (76 y.o. 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 - Dr. Heber [place] and [place], Room 8 Tuesday 10/29/2021 0900 Return A Other: - [employer]- will send out wound care supplies Keystone topical antibiotic bring in at each appt time. Bathing/ Shower/ Hygiene May shower and wash wound with soap and water. Edema Control - Lymphedema / SCD / Other Elevate legs to the level of the heart or above for 30 minutes daily and/or when sitting, a frequency of: - 3-4 times a day throughout the day. Avoid standing for long periods of time. Exercise regularly Moisturize legs daily. - both legs every night before bed. Wound Treatment Wound #1 - Lower Leg Wound Laterality: Left, Anterior Cleanser: Soap and Water 1 x Per Week/30 Days Discharge Instructions: May shower and wash wound with dial antibacterial soap and water prior to dressing change. Peri-Wound Care: Triamcinolone 15 (g) 1 x Per Week/30 Days Discharge Instructions: Use triamcinolone 15 (g) as directed Peri-Wound Care: Sween Lotion (Moisturizing lotion) 1 x Per Week/30 Days Discharge Instructions: Apply moisturizing lotion as directed Topical: keystone topical compounding antibiotics 1 x Per Week/30 Days Discharge Instructions: will apply mupirocin in clinic and will use compounding antibiotics once it arrives at next appt time. Prim Dressing: PolyMem Silver Non-Adhesive Dressing, 4.25x4.25 in 1 x Per Week/30 Days ary Discharge Instructions: Apply to wound bed as instructed Secondary Dressing: Zetuvit Plus 4x8 in 1 x Per Week/30 Days Discharge Instructions: Apply over primary dressing as directed. Compression Wrap: FourPress (4  layer compression wrap) 1 x Per Week/30 Days Discharge Instructions: Apply four layer compression as directed. May also use Miliken CoFlex 2 layer compression system as alternative. Electronic Signature(s) Signed: 10/22/2021 6:35:30 PM By: Deon Pilling RN, BSN Signed: 10/23/2021 11:30:51 AM By: Kalman Shan DO Entered By: Deon Pilling on 10/22/2021 18:12:35 -------------------------------------------------------------------------------- Problem List Details Patient Name: Date of Service: [name], [name] 10/22/2021  9:00 A M Medical Record Number: [id number] Patient Account Number: [id number] Date of Birth/Sex: Treating RN: Oct 15, 1945 (76 y.o. [name] Primary Care Provider: [name] Other Clinician: Referring Provider: Treating Provider/Extender: Loreen Freud in Treatment: 11 Active Problems ICD-10 Encounter Code Description Active Date MDM Diagnosis [phone] Non-pressure chronic ulcer of other part of left lower leg with fat layer exposed5/12/2021 No Yes T79.9XXA Unspecified early complication of trauma, initial encounter 08/06/2021 No Yes I10 Essential (primary) hypertension 08/06/2021 No Yes I89.0 Lymphedema, not elsewhere classified 08/06/2021 No Yes I87.2 Venous insufficiency (chronic) (peripheral) 08/06/2021 No Yes C50.919 Malignant neoplasm of unspecified site of unspecified female breast 08/06/2021 No Yes Inactive Problems Resolved Problems Electronic Signature(s) Signed: 10/22/2021 2:06:16 PM By: Kalman Shan DO Entered By: Kalman Shan on 10/22/2021 11:32:15 -------------------------------------------------------------------------------- Progress Note Details Patient Name: Date of Service: [name], [name] 10/22/2021 9:00 A M Medical Record Number: [id number] Patient Account Number: [id number] Date of Birth/Sex: Treating RN: [birth year] (76 y.o. [name] Primary Care Provider: [name] Other  Clinician: Referring Provider: Treating Provider/Extender: Loreen Freud in Treatment: 11 Subjective Chief Complaint Information obtained from Patient 08/06/2021; left lower extremity wound following trauma History of Present Illness (HPI) Admission/20 08/2021 Ms. Ayaan Ringle is a 76 year old female with a past medical history of breast cancer status post bilateral mastectomy that presents the clinic for a 66-monthhistory of nonhealing ulcer to the left lower extremity. On 05/06/2021 while at work she hit her leg against the corner of a desk experiencing a decent laceration. She went to the ED and had sutures placed. These have since been removed. She has been on 2 rounds of antibiotics including Keflex and Bactrim for this issue. She reports chronic pain to the wound site. She has been using Xeroform with dressing changes. She denies purulent drainage. 5/1; patient presents for follow-up. She started using gentamicin ointment and Hydrofera Blue over the past week. She reports improvement in her chronic pain. She has compression stockings but it is unclear if she is wearing these daily. She currently denies systemic signs of infection. She had a PCR culture done at last clinic visit that showed high levels of Staph aureus. Keystone antibiotics has been ordered and she states she is receiving this soon. 5/9; patient presents for follow-up. She tolerated the compression wrap well. We have been using gentamicin and Hydrofera Blue under the wrap. She is still not obtained her Keystone antibiotics. She states she will try to get this soon. She denies signs of infection. 5/16; patient presents for follow-up. She states she is obtaining Keystone antibiotics today. She has no issues or complaints today. She tolerated the compression wrap well. We did in office ABIs and they were 0.97 on the left. 5/23; patient presents for follow-up. She has her Keystone antibiotics with her  today. She has no issues or complaints. She tolerated the compression wrap well. 5/30; patient presents for follow-up. We have been using Keystone antibiotic with Sorbact under compression wrap. She has no issues or complaints today. She denies signs of infection. 6/6; Keystone with Iodoflex as of last week. Per our intake nurse everything looks a little better she is using 3 layer compression 6/13; patient presents for follow-up. She has been using Keystone with Iodoflex under compression. She was increased to 4 layer at last clinic visit. 6/22; patient presents for follow-up. We have been using Keystone antibiotics with Iodoflex under compression. She has no issues or complaints  today. 6/27; patient presents for follow-up. We have been using Keystone antibiotics with PolyMem silver under compression for the past week. She has no issues or complaints today. 7/6; patient presents for follow-up. We have been using Keystone antibiotic with PolyMem silver under the compression wrap with no issues. Patient reports improvement in wound healing. 7/11; patient presents for follow-up. We have been using Keystone along with PolyMem silver under compression wrap. Patient has no issues or complaints today. Patient History Family History Cancer - Father, Diabetes - Father, Heart Disease - Father, Hypertension - Father. Social History Former smoker - quit 1998, Marital Status - Married, Alcohol Use - Never, Drug Use - No History, Caffeine Use - Never. Medical History Cardiovascular Patient has history of Hypertension Hospitalization/Surgery History - eyes surgery. - tonsillectomy. - hysterectomy. - bilateral mastectomy. - deviated septum from a pear falling from a tree.. Medical A Surgical History Notes nd Constitutional Symptoms (General Health) Bilateral mastectomy Breast Cancer Vitamin D deficiency Cardiovascular Ventricular tachycardia Objective Constitutional respirations regular, non-labored  and within target range for patient.. Vitals Time Taken: 9:18 AM, Height: 63 in, Weight: 220 lbs, BMI: 39, Temperature: 98.3 F, Pulse: 103 bpm, Respiratory Rate: 16 breaths/min, Blood Pressure: 106/89 mmHg. Cardiovascular 2+ dorsalis pedis/posterior tibialis pulses. Psychiatric pleasant and cooperative. General Notes: T the anterior aspect there is an open wound with granulation tissue and tightly adhered fibrinous tissue. No signs of surrounding infection. o Good edema control. Integumentary (Hair, Skin) Wound #1 status is Open. Original cause of wound was Trauma. The date acquired was: 05/06/2021. The wound has been in treatment 11 weeks. The wound is located on the Left,Anterior Lower Leg. The wound measures 1.2cm length x 1.9cm width x 0.1cm depth; 1.791cm^2 area and 0.179cm^3 volume. There is Fat Layer (Subcutaneous Tissue) exposed. There is no tunneling or undermining noted. There is a medium amount of serosanguineous drainage noted. The wound margin is distinct with the outline attached to the wound base. There is large (67-100%) red, pink granulation within the wound bed. There is no necrotic tissue within the wound bed. Assessment Active Problems ICD-10 Non-pressure chronic ulcer of other part of left lower leg with fat layer exposed Unspecified early complication of trauma, initial encounter Essential (primary) hypertension Lymphedema, not elsewhere classified Venous insufficiency (chronic) (peripheral) Malignant neoplasm of unspecified site of unspecified female breast Patient's wound continues to show improvement in size and appearance. I recommended continuing PolyMem silver with Keystone under 2 layer Coflex. Follow-up in 1 week. Procedures Wound #1 Pre-procedure diagnosis of Wound #1 is an Abrasion located on the Left,Anterior Lower Leg . There was a Double Layer Compression Therapy Procedure by Deon Pilling, RN. Post procedure Diagnosis Wound #1: Same as  Pre-Procedure Plan Follow-up Appointments: Return Appointment in 1 week. - Dr. Heber [place] and [place], Room 8 Tuesday 10/29/2021 0900 Other: - [employer]- will send out wound care supplies Keystone topical antibiotic bring in at each appt time. Bathing/ Shower/ Hygiene: May shower and wash wound with soap and water. Edema Control - Lymphedema / SCD / Other: Elevate legs to the level of the heart or above for 30 minutes daily and/or when sitting, a frequency of: - 3-4 times a day throughout the day. Avoid standing for long periods of time. Exercise regularly Moisturize legs daily. - both legs every night before bed. WOUND #1: - Lower Leg Wound Laterality: Left, Anterior Cleanser: Soap and Water 1 x Per Week/30 Days Discharge Instructions: May shower and wash wound with dial antibacterial soap and water  prior to dressing change. Peri-Wound Care: Triamcinolone 15 (g) 1 x Per Week/30 Days Discharge Instructions: Use triamcinolone 15 (g) as directed Peri-Wound Care: Sween Lotion (Moisturizing lotion) 1 x Per Week/30 Days Discharge Instructions: Apply moisturizing lotion as directed Topical: keystone topical compounding antibiotics 1 x Per Week/30 Days Discharge Instructions: will apply mupirocin in clinic and will use compounding antibiotics once it arrives at next appt time. Prim Dressing: PolyMem Silver Non-Adhesive Dressing, 4.25x4.25 in 1 x Per Week/30 Days ary Discharge Instructions: Apply to wound bed as instructed Secondary Dressing: Zetuvit Plus 4x8 in 1 x Per Week/30 Days Discharge Instructions: Apply over primary dressing as directed. Com pression Wrap: FourPress (4 layer compression wrap) 1 x Per Week/30 Days Discharge Instructions: Apply four layer compression as directed. May also use Miliken CoFlex 2 layer compression system as alternative. 1. PolyMem silver with Keystone antibiotic under 2 layer Coflex 2. Follow-up in 1 week Electronic Signature(s) Signed: 10/22/2021 6:35:30  PM By: Deon Pilling RN, BSN Signed: 10/23/2021 11:30:51 AM By: Kalman Shan DO Previous Signature: 10/22/2021 2:06:16 PM Version By: Kalman Shan DO Entered By: Deon Pilling on 10/22/2021 18:13:03 -------------------------------------------------------------------------------- HxROS Details Patient Name: Date of Service: [name], [name] 10/22/2021 9:00 A M Medical Record Number: [id number] Patient Account Number: [id number] Date of Birth/Sex: Treating RN: 1945/07/12 (76 y.o. [name], [name] in Treatment: 11 Constitutional Symptoms (General Health) Medical History: Past Medical History Notes: Bilateral mastectomy Breast Cancer Vitamin D deficiency Cardiovascular Medical History: Positive for: Hypertension Past Medical History Notes: Ventricular tachycardia Immunizations Pneumococcal Vaccine: Received Pneumococcal Vaccination: No Implantable Devices No devices added Hospitalization / Surgery History Type of Hospitalization/Surgery eyes surgery tonsillectomy hysterectomy bilateral mastectomy deviated septum from a pear falling from a tree. Family and Social History Cancer: Yes - Father; Diabetes: Yes - Father; Heart Disease: Yes - Father; Hypertension: Yes - Father; Former smoker - quit 1998; Marital Status - Married; Alcohol Use: Never; Drug Use: No History; Caffeine Use: Never; Financial Concerns: No; Food, Clothing or Shelter Needs: No; Support System Lacking: No; Transportation Concerns: No Electronic Signature(s) Signed: 10/22/2021 2:06:16 PM By: Kalman Shan DO Signed: 10/22/2021 6:35:30 PM By: Deon Pilling RN, BSN Entered By: Kalman Shan on 10/22/2021 11:32:54 -------------------------------------------------------------------------------- SuperBill Details Patient Name: Date of Service: CO [name], SA  [name] 10/22/2021 Medical Record Number: [id number] Patient Account Number: [id number] Date of Birth/Sex: Treating RN: 09/19/1945 (76 y.o. [name] Primary Care Provider: [name] Other Clinician: Referring Provider: Treating Provider/Extender: Loreen Freud in Treatment: 11 Diagnosis Coding ICD-10 Codes Code Description [id number] Non-pressure chronic ulcer of other part of left lower leg with fat layer exposed T79.9XXA Unspecified early complication of trauma, initial encounter I10 Essential (primary) hypertension I89.0 Lymphedema, not elsewhere classified I87.2 Venous insufficiency (chronic) (peripheral) C50.919 Malignant neoplasm of unspecified site of unspecified female breast Facility Procedures CPT4 Code: [phone] Description: (Facility Use Only) [phone] - [facility] Modifier: Quantity: 1 Physician Procedures Electronic Signature(s) Signed: 10/22/2021 6:35:30 PM By: Deon Pilling RN, BSN Signed: 10/23/2021 11:30:51 AM By: Kalman Shan DO Previous Signature: 10/22/2021 2:06:16 PM Version By: Kalman Shan DO Entered By: Deon Pilling on 10/22/2021 18:12:51

## 2021-10-29 ENCOUNTER — Encounter (HOSPITAL_BASED_OUTPATIENT_CLINIC_OR_DEPARTMENT_OTHER): Payer: No Typology Code available for payment source | Admitting: Internal Medicine

## 2021-10-29 DIAGNOSIS — L97822 Non-pressure chronic ulcer of other part of left lower leg with fat layer exposed: Secondary | ICD-10-CM | POA: Diagnosis not present

## 2021-10-29 DIAGNOSIS — T799XXA Unspecified early complication of trauma, initial encounter: Secondary | ICD-10-CM

## 2021-10-29 DIAGNOSIS — I890 Lymphedema, not elsewhere classified: Secondary | ICD-10-CM

## 2021-10-29 DIAGNOSIS — I872 Venous insufficiency (chronic) (peripheral): Secondary | ICD-10-CM | POA: Diagnosis not present

## 2021-10-30 NOTE — Progress Notes (Signed)
Jessica, [name] ([id number]) Visit Report for 10/29/2021 Chief Complaint Document Details Patient Name: Date of Service: [name], [place] [name] 10/29/2021 9:00 A M Medical Record Number: [id number] Patient Account Number: [id number] Date of Birth/Sex: Treating RN: May 10, 1945 (76 y.o. [name], [name] in Treatment: 12 Information Obtained from: Patient Chief Complaint 08/06/2021; left lower extremity wound following trauma Electronic Signature(s) Signed: 10/29/2021 12:02:15 PM By: Kalman Shan DO Entered By: Kalman Shan on 10/29/2021 09:23:22 -------------------------------------------------------------------------------- HPI Details Patient Name: Date of Service: [name], [name] 10/29/2021 9:00 A M Medical Record Number: [id number] Patient Account Number: [id number] Date of Birth/Sex: Treating RN: 1945-08-06 (76 y.o. [name] Primary Care Provider: [name] Other Clinician: Referring Provider: Treating Provider/Extender: Loreen Freud in Treatment: 12 History of Present Illness HPI Description: Admission/20 08/2021 [name] is a 76 year old female with a past medical history of breast cancer status post bilateral mastectomy that presents the clinic for a 32-monthhistory of nonhealing ulcer to the left lower extremity. On 05/06/2021 while at work she hit her leg against the corner of a desk experiencing a decent laceration. She went to the ED and had sutures placed. These have since been removed. She has been on 2 rounds of antibiotics including Keflex and Bactrim for this issue. She reports chronic pain to the wound site. She has been using Xeroform with dressing changes. She denies purulent drainage. 5/1; patient presents for follow-up. She started using gentamicin ointment and Hydrofera  Blue over the past week. She reports improvement in her chronic pain. She has compression stockings but it is unclear if she is wearing these daily. She currently denies systemic signs of infection. She had a PCR culture done at last clinic visit that showed high levels of Staph aureus. Keystone antibiotics has been ordered and she states she is receiving this soon. 5/9; patient presents for follow-up. She tolerated the compression wrap well. We have been using gentamicin and Hydrofera Blue under the wrap. She is still not obtained her Keystone antibiotics. She states she will try to get this soon. She denies signs of infection. 5/16; patient presents for follow-up. She states she is obtaining Keystone antibiotics today. She has no issues or complaints today. She tolerated the compression wrap well. We did in office ABIs and they were 0.97 on the left. 5/23; patient presents for follow-up. She has her Keystone antibiotics with her today. She has no issues or complaints. She tolerated the compression wrap well. 5/30; patient presents for follow-up. We have been using Keystone antibiotic with Sorbact under compression wrap. She has no issues or complaints today. She denies signs of infection. 6/6; Keystone with Iodoflex as of last week. Per our intake nurse everything looks a little better she is using 3 layer compression 6/13; patient presents for follow-up. She has been using Keystone with Iodoflex under compression. She was increased to 4 layer at last clinic visit. 6/22; patient presents for follow-up. We have been using Keystone antibiotics with Iodoflex under compression. She has no issues or complaints today. 6/27; patient presents for follow-up. We have been using Keystone antibiotics with PolyMem silver under compression for the past week. She has no issues or complaints today. 7/6; patient presents for follow-up. We have been using Keystone antibiotic with PolyMem silver under the compression  wrap with no issues. Patient reports improvement in wound healing. 7/11;  patient presents for follow-up. We have been using Keystone along with PolyMem silver under compression wrap. Patient has no issues or complaints today. 7/18; patient presents for follow-up. We continue to use [facility] with PolyMem silver under compression therapy. There is continued wound healing. Patient has no issues or complaints today. Electronic Signature(s) Signed: 10/29/2021 12:02:15 PM By: Kalman Shan DO Entered By: Kalman Shan on 10/29/2021 09:24:00 -------------------------------------------------------------------------------- Physical Exam Details Patient Name: Date of Service: CO [name], [name] 10/29/2021 9:00 A M Medical Record Number: [id number] Patient Account Number: [id number] Date of Birth/Sex: Treating RN: 01-17-1946 (76 y.o. [name] Primary Care Provider: [name] Other Clinician: Referring Provider: Treating Provider/Extender: Loreen Freud in Treatment: 12 Constitutional respirations regular, non-labored and within target range for patient.. Cardiovascular 2+ dorsalis pedis/posterior tibialis pulses. Psychiatric pleasant and cooperative. Notes T the anterior aspect there is an open wound with granulation tissue and tightly adhered fibrinous tissue. No signs of surrounding infection. Good edema o control. Electronic Signature(s) Signed: 10/29/2021 12:02:15 PM By: Kalman Shan DO Entered By: Kalman Shan on 10/29/2021 09:25:36 -------------------------------------------------------------------------------- Physician Orders Details Patient Name: Date of Service: [name], [name] 10/29/2021 9:00 A M Medical Record Number: [id number] Patient Account Number: [id number] Date of Birth/Sex: Treating RN: [birth year] (76 y.o. 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 - Dr. Heber [place] and [place], Room 8 Tuesday 11/05/2021 1115 Return A ppointment in 2 weeks. - Dr. Heber [place] and [place], Room 8 Tuesday 11/12/2021 0815 Return A Other: - [employer]- will send out wound care supplies Keystone topical antibiotic bring in at each appt time. Bathing/ Shower/ Hygiene May shower and wash wound with soap and water. Edema Control - Lymphedema / SCD / Other Elevate legs to the level of the heart or above for 30 minutes daily and/or when sitting, a frequency of: - 3-4 times a day throughout the day. Avoid standing for long periods of time. Exercise regularly Moisturize legs daily. - both legs every night before bed. Wound Treatment Wound #1 - Lower Leg Wound Laterality: Left, Anterior Cleanser: Soap and Water 1 x Per Week/30 Days Discharge Instructions: May shower and wash wound with dial antibacterial soap and water prior to dressing change. Peri-Wound Care: Triamcinolone 15 (g) 1 x Per Week/30 Days Discharge Instructions: Use triamcinolone 15 (g) as directed Peri-Wound Care: Sween Lotion (Moisturizing lotion) 1 x Per Week/30 Days Discharge Instructions: Apply moisturizing lotion as directed Topical: keystone topical compounding antibiotics 1 x Per Week/30 Days Discharge Instructions: will apply mupirocin in clinic and will use compounding antibiotics once it arrives at next appt time. Prim Dressing: PolyMem  Silver Non-Adhesive Dressing, 4.25x4.25 in 1 x Per Week/30 Days ary Discharge Instructions: Apply to wound bed as instructed Secondary Dressing: Zetuvit Plus 4x8 in 1 x Per Week/30 Days Discharge Instructions: Apply over primary dressing as directed. Compression Wrap: FourPress (4 layer compression wrap) 1 x Per Week/30 Days Discharge Instructions: Apply four layer compression as directed. May also use Miliken CoFlex 2 layer compression  system as alternative. Electronic Signature(s) Signed: 10/29/2021 12:02:15 PM By: Kalman Shan DO Entered By: Kalman Shan on 10/29/2021 09:26:00 -------------------------------------------------------------------------------- Problem List Details Patient Name: Date of Service: [name], [name] 10/29/2021 9:00 A M Medical Record Number: [id number] Patient Account Number: [id number] Date of Birth/Sex: Treating RN: [birth year] (76 y.o. [name] Primary Care Provider: [name] Other Clinician: Referring Provider: Treating Provider/Extender: Loreen Freud in Treatment: 12 Active Problems ICD-10 Encounter Code Description Active Date MDM Diagnosis [phone] Non-pressure chronic ulcer of other part of left lower leg with fat layer exposed5/12/2021 No Yes T79.9XXA Unspecified early complication of trauma, initial encounter 08/06/2021 No Yes I10 Essential (primary) hypertension 08/06/2021 No Yes I89.0 Lymphedema, not elsewhere classified 08/06/2021 No Yes I87.2 Venous insufficiency (chronic) (peripheral) 08/06/2021 No Yes C50.919 Malignant neoplasm of unspecified site of unspecified female breast 08/06/2021 No Yes Inactive Problems Resolved Problems Electronic Signature(s) Signed: 10/29/2021 12:02:15 PM By: Kalman Shan DO Entered By: Kalman Shan on 10/29/2021 09:23:14 -------------------------------------------------------------------------------- Progress Note Details Patient Name: Date of  Service: [name], [name] 10/29/2021 9:00 A M Medical Record Number: [id number] Patient Account Number: [id number] Date of Birth/Sex: Treating RN: 09/15/1945 (76 y.o. [name] Primary Care Provider: [name] Other Clinician: Referring Provider: Treating Provider/Extender: Loreen Freud in Treatment: 12 Subjective Chief Complaint Information obtained from Patient 08/06/2021; left lower extremity wound following trauma History of Present Illness (HPI) Admission/20 08/2021 Ms. Skyra Crichlow is a 76 year old female with a past medical history of breast cancer status post bilateral mastectomy that presents the clinic for a 71-monthhistory of nonhealing ulcer to the left lower extremity. On 05/06/2021 while at work she hit her leg against the corner of a desk experiencing a decent laceration. She went to the ED and had sutures placed. These have since been removed. She has been on 2 rounds of antibiotics including Keflex and Bactrim for this issue. She reports chronic pain to the wound site. She has been using Xeroform with dressing changes. She denies purulent drainage. 5/1; patient presents for follow-up. She started using gentamicin ointment and Hydrofera Blue over the past week. She reports improvement in her chronic pain. She has compression stockings but it is unclear if she is wearing these daily. She currently denies systemic signs of infection. She had a PCR culture done at last clinic visit that showed high levels of Staph aureus. Keystone antibiotics has been ordered and she states she is receiving this soon. 5/9; patient presents for follow-up. She tolerated the compression wrap well. We have been using gentamicin and Hydrofera Blue under the wrap. She is still not obtained her Keystone antibiotics. She states she will try to get this soon. She denies signs of infection. 5/16; patient presents for follow-up. She states she is obtaining Keystone  antibiotics today. She has no issues or complaints today. She tolerated the compression wrap well. We did in office ABIs and they were 0.97 on the left. 5/23; patient presents for follow-up. She has her Keystone antibiotics with her today. She has no issues or complaints. She tolerated the compression wrap well. 5/30; patient presents for follow-up. We have been using Keystone antibiotic with Sorbact under compression wrap. She has no issues or complaints today. She denies signs of infection. 6/6; Keystone with Iodoflex as of last week. Per our intake nurse everything looks a little better she is using 3 layer compression 6/13; patient presents for follow-up. She has been using Keystone  with Iodoflex under compression. She was increased to 4 layer at last clinic visit. 6/22; patient presents for follow-up. We have been using Keystone antibiotics with Iodoflex under compression. She has no issues or complaints today. 6/27; patient presents for follow-up. We have been using Keystone antibiotics with PolyMem silver under compression for the past week. She has no issues or complaints today. 7/6; patient presents for follow-up. We have been using Keystone antibiotic with PolyMem silver under the compression wrap with no issues. Patient reports improvement in wound healing. 7/11; patient presents for follow-up. We have been using Keystone along with PolyMem silver under compression wrap. Patient has no issues or complaints today. 7/18; patient presents for follow-up. We continue to use [facility] with PolyMem silver under compression therapy. There is continued wound healing. Patient has no issues or complaints today. Patient History Family History Cancer - Father, Diabetes - Father, Heart Disease - Father, Hypertension - Father. Social History Former smoker - quit 1998, Marital Status - Married, Alcohol Use - Never, Drug Use - No History, Caffeine Use - Never. Medical History Cardiovascular Patient  has history of Hypertension Hospitalization/Surgery History - eyes surgery. - tonsillectomy. - hysterectomy. - bilateral mastectomy. - deviated septum from a pear falling from a tree.. Medical A Surgical History Notes nd Constitutional Symptoms (General Health) Bilateral mastectomy Breast Cancer Vitamin D deficiency Cardiovascular Ventricular tachycardia Objective Constitutional respirations regular, non-labored and within target range for patient.. Vitals Time Taken: 9:10 AM, Height: 63 in, Weight: 220 lbs, BMI: 39, Temperature: 98.9 F, Pulse: 81 bpm, Respiratory Rate: 20 breaths/min, Blood Pressure: 136/80 mmHg. Cardiovascular 2+ dorsalis pedis/posterior tibialis pulses. Psychiatric pleasant and cooperative. General Notes: T the anterior aspect there is an open wound with granulation tissue and tightly adhered fibrinous tissue. No signs of surrounding infection. o Good edema control. Integumentary (Hair, Skin) Wound #1 status is Open. Original cause of wound was Trauma. The date acquired was: 05/06/2021. The wound has been in treatment 12 weeks. The wound is located on the Left,Anterior Lower Leg. The wound measures 0.9cm length x 1.5cm width x 0.1cm depth; 1.06cm^2 area and 0.106cm^3 volume. There is Fat Layer (Subcutaneous Tissue) exposed. There is no tunneling or undermining noted. There is a medium amount of serosanguineous drainage noted. The wound margin is distinct with the outline attached to the wound base. There is large (67-100%) red, pink granulation within the wound bed. There is a small (1-33%) amount of necrotic tissue within the wound bed including Adherent Slough. Assessment Active Problems ICD-10 Non-pressure chronic ulcer of other part of left lower leg with fat layer exposed Unspecified early complication of trauma, initial encounter Essential (primary) hypertension Lymphedema, not elsewhere classified Venous insufficiency (chronic) (peripheral) Malignant  neoplasm of unspecified site of unspecified female breast Patient's wound continues to show improvement in size and appearance. No signs of surrounding soft tissue infection. I recommended continuing the course with Keystone and PolyMem silver under 4-layer compression. Procedures Wound #1 Pre-procedure diagnosis of Wound #1 is an Abrasion located on the Left,Anterior Lower Leg . There was a Double Layer Compression Therapy Procedure by Deon Pilling, RN. Post procedure Diagnosis Wound #1: Same as Pre-Procedure Plan Follow-up Appointments: Return Appointment in 1 week. - Dr. Heber [place] and [place], Room 8 Tuesday 11/05/2021 1115 Return Appointment in 2 weeks. - Dr. Heber [place] and [place], Room 8 Tuesday 11/12/2021 0815 Other: [employer]- will send out wound care supplies Keystone topical antibiotic bring in at each appt time. Bathing/ Shower/ Hygiene: May shower and wash wound  with soap and water. Edema Control - Lymphedema / SCD / Other: Elevate legs to the level of the heart or above for 30 minutes daily and/or when sitting, a frequency of: - 3-4 times a day throughout the day. Avoid standing for long periods of time. Exercise regularly Moisturize legs daily. - both legs every night before bed. WOUND #1: - Lower Leg Wound Laterality: Left, Anterior Cleanser: Soap and Water 1 x Per Week/30 Days Discharge Instructions: May shower and wash wound with dial antibacterial soap and water prior to dressing change. Peri-Wound Care: Triamcinolone 15 (g) 1 x Per Week/30 Days Discharge Instructions: Use triamcinolone 15 (g) as directed Peri-Wound Care: Sween Lotion (Moisturizing lotion) 1 x Per Week/30 Days Discharge Instructions: Apply moisturizing lotion as directed Topical: keystone topical compounding antibiotics 1 x Per Week/30 Days Discharge Instructions: will apply mupirocin in clinic and will use compounding antibiotics once it arrives at next appt time. Prim Dressing: PolyMem Silver  Non-Adhesive Dressing, 4.25x4.25 in 1 x Per Week/30 Days ary Discharge Instructions: Apply to wound bed as instructed Secondary Dressing: Zetuvit Plus 4x8 in 1 x Per Week/30 Days Discharge Instructions: Apply over primary dressing as directed. Com pression Wrap: FourPress (4 layer compression wrap) 1 x Per Week/30 Days Discharge Instructions: Apply four layer compression as directed. May also use Miliken CoFlex 2 layer compression system as alternative. 1. Keystone, PolyMem silver under 4-layer compression 2. Follow-up in 1 week Electronic Signature(s) Signed: 10/29/2021 12:02:15 PM By: Kalman Shan DO Entered By: Kalman Shan on 10/29/2021 09:27:00 -------------------------------------------------------------------------------- HxROS Details Patient Name: Date of Service: [name], [name] 10/29/2021 9:00 A M Medical Record Number: [id number] Patient Account Number: [id number] Date of Birth/Sex: Treating RN: 1945/12/05 (76 y.o. [name], [name] in Treatment: 12 Constitutional Symptoms (General Health) Medical History: Past Medical History Notes: Bilateral mastectomy Breast Cancer Vitamin D deficiency Cardiovascular Medical History: Positive for: Hypertension Past Medical History Notes: Ventricular tachycardia Immunizations Pneumococcal Vaccine: Received Pneumococcal Vaccination: No Implantable Devices No devices added Hospitalization / Surgery History Type of Hospitalization/Surgery eyes surgery tonsillectomy hysterectomy bilateral mastectomy deviated septum from a pear falling from a tree. Family and Social History Cancer: Yes - Father; Diabetes: Yes - Father; Heart Disease: Yes - Father; Hypertension: Yes - Father; Former smoker - quit 1998; Marital Status - Married; Alcohol Use: Never; Drug Use: No History;  Caffeine Use: Never; Financial Concerns: No; Food, Clothing or Shelter Needs: No; Support System Lacking: No; Transportation Concerns: No Electronic Signature(s) Signed: 10/29/2021 12:02:15 PM By: Kalman Shan DO Signed: 10/30/2021 4:20:13 PM By: Deon Pilling RN, BSN Entered By: Kalman Shan on 10/29/2021 09:24:06 -------------------------------------------------------------------------------- SuperBill Details Patient Name: Date of Service: [name], [name] 10/29/2021 Medical Record Number: [id number] Patient Account Number: [id number] Date of Birth/Sex: Treating RN: 1945/09/26 (76 y.o. [name] Primary Care Provider: [name] Other Clinician: Referring Provider: Treating Provider/Extender: Loreen Freud in Treatment: 12 Diagnosis Coding ICD-10 Codes Code Description [phone] Non-pressure chronic ulcer of other part of left lower leg with fat layer exposed T79.9XXA Unspecified early complication of trauma, initial encounter I10 Essential (primary) hypertension I89.0 Lymphedema, not elsewhere classified I87.2 Venous insufficiency (chronic) (peripheral) C50.919 Malignant neoplasm of unspecified site of unspecified female breast Facility Procedures CPT4 Code: [id number] Description: (Facility Use Only) [phone] - [place] LWR LT LEG Modifier: Quantity: 1 Physician Procedures : CPT4 Code Description Modifier [id number]  [id number] - WC PHYS LEVEL 3 - EST PT ICD-10 Diagnosis Description L97.822 Non-pressure chronic ulcer of other part of left lower leg with fat layer exposed T79.9XXA Unspecified early complication of trauma, initial  encounter I89.0 Lymphedema, not elsewhere classified I87.2 Venous insufficiency (chronic) (peripheral) Quantity: 1 Electronic Signature(s) Signed: 10/29/2021 12:02:15 PM By: Kalman Shan DO Entered By: Kalman Shan on 10/29/2021 09:27:14

## 2021-10-30 NOTE — Progress Notes (Signed)
[name] ([id number]) Visit Report for 10/29/2021 Arrival Information Details Patient Name: Date of Service: Jessica [place], [place] [name] 10/29/2021 9:00 A M Medical Record Number: [id number] Patient Account Number: [id number] Date of Birth/Sex: Treating RN: October 27, 1945 (76 y.o. [name], [name] History Since Last Visit Added or deleted any medications: No Patient Arrived: [name] Any new allergies or adverse reactions: No Arrival Time: 09:10 Had a fall or experienced change in No Accompanied By: self activities of daily living that may affect Transfer Assistance: None risk of falls: Patient Identification Verified: Yes Signs or symptoms of abuse/neglect since last visito No Secondary Verification Process Completed: Yes Hospitalized since last visit: No Patient Requires Transmission-Based Precautions: No Implantable device outside of the clinic excluding No Patient Has Alerts: No cellular tissue based products placed in the center since last visit: Has Dressing in Place as Prescribed: Yes Has Compression in Place as Prescribed: Yes Pain Present Now: No Electronic Signature(s) Signed: 10/30/2021 4:20:13 PM By: Deon Pilling RN, BSN Entered By: Deon Pilling on 10/29/2021 09:13:39 -------------------------------------------------------------------------------- Compression Therapy Details Patient Name: Date of Service: Jessica Jessica Kaufmann, [name] 10/29/2021 9:00 A M Medical Record Number: [id number] Patient Account Number: [id number] Date of Birth/Sex: Treating RN: May 28, 1945 (76 y.o. [name], [name] in Treatment: 12 Compression Therapy Performed for Wound Assessment:  Wound #1 Left,Anterior Lower Leg Performed By: Clinician Deon Pilling, RN Compression Type: Double Layer Post Procedure Diagnosis Same as Pre-procedure Electronic Signature(s) Signed: 10/30/2021 4:20:13 PM By: Deon Pilling RN, BSN Entered By: Deon Pilling on 10/29/2021 09:21:39 -------------------------------------------------------------------------------- Encounter Discharge Information Details Patient Name: Date of Service: Jessica Jessica Kaufmann, [name] 10/29/2021 9:00 A M Medical Record Number: [id number] Patient Account Number: [id number] Date of Birth/Sex: Treating RN: 09-19-1945 (76 y.o. [name], [name] in Treatment: 12 Encounter Discharge Information Items Discharge Condition: Stable Ambulatory Status: Cane Discharge Destination: Home Transportation: Private Auto Accompanied By: self Schedule Follow-up Appointment: Yes Clinical Summary of Care: Electronic Signature(s) Signed: 10/30/2021 4:20:13 PM By: Deon Pilling RN, BSN Entered By: Deon Pilling on 10/29/2021 09:24:09 -------------------------------------------------------------------------------- Lower Extremity Assessment Details Patient Name: Date of Service: Jessica [name], [name] 10/29/2021 9:00 A M Medical Record Number: [id number] Patient Account Number: [id number] Date of Birth/Sex: Treating RN: Sep 30, 1945 (76 y.o. [name], [name] in Treatment: 12 Edema Assessment Assessed: [name]: Yes] [name]: No] Edema: [Left: N] [Right: o] Calf Left: Right: Point of Measurement: 30 cm From Medial Instep 40 cm Ankle Left: Right: Point of Measurement: 9 cm From Medial Instep 25.5 cm Vascular Assessment Pulses: Dorsalis Pedis Palpable:  [Left:Yes] Electronic Signature(s) Signed: 10/30/2021 4:20:13 PM By: Deon Pilling RN, BSN Entered By: Deon Pilling on 10/29/2021 09:16:10 -------------------------------------------------------------------------------- Multi Wound Chart Details Patient Name: Date of Service: Jessica Jessica Kaufmann, [name] 10/29/2021 9:00 A M Medical Record Number: [id number] Patient Account Number: [id number] Date of Birth/Sex: Treating RN: [birth year] (76 y.o. [name] Primary Care Provider: [name] Other Clinician: Referring Provider: Treating Provider/Extender: Loreen Freud in Treatment: 12 Vital Signs Height(in): 63 Pulse(bpm): 81 Weight(lbs): 220 Blood  Pressure(mmHg): 136/80 Body Mass Index(BMI): 39 Temperature(F): 98.9 Respiratory Rate(breaths/min): 20 Photos: [N/A:N/A] Left, Anterior Lower Leg N/A N/A Wound Location: Trauma N/A N/A Wounding Event: Abrasion N/A N/A Primary Etiology: Lymphedema N/A N/A Secondary Etiology: Hypertension N/A N/A Comorbid History: 05/06/2021 N/A N/A Date Acquired: 12 N/A N/A Weeks of Treatment: Open N/A N/A Wound Status: No N/A N/A Wound Recurrence: 0.9x1.5x0.1 N/A N/A Measurements L x W x D (cm) 1.06 N/A N/A A (cm) : rea 0.106 N/A N/A Volume (cm) : 67.90% N/A N/A % Reduction in A rea: 67.90% N/A N/A % Reduction in Volume: Full Thickness Without Exposed N/A N/A Classification: Support Structures Medium N/A N/A Exudate Amount: Serosanguineous N/A N/A Exudate Type: red, brown N/A N/A Exudate Color: Distinct, outline attached N/A N/A Wound Margin: Large (67-100%) N/A N/A Granulation Amount: Red, Pink N/A N/A Granulation Quality: Small (1-33%) N/A N/A Necrotic Amount: Fat Layer (Subcutaneous Tissue): Yes N/A N/A Exposed Structures: Fascia: No Tendon: No Muscle: No Joint: No Bone: No Large (67-100%) N/A N/A Epithelialization: Compression Therapy N/A N/A Procedures Performed: Treatment  Notes Electronic Signature(s) Signed: 10/29/2021 12:02:15 PM By: Kalman Shan DO Signed: 10/30/2021 4:20:13 PM By: Deon Pilling RN, BSN Entered By: Kalman Shan on 10/29/2021 09:23:17 -------------------------------------------------------------------------------- Multi-Disciplinary Care Plan Details Patient Name: Date of Service: Jessica [name], [name] 10/29/2021 9:00 A M Medical Record Number: [id number] Patient Account Number: [id number] Date of Birth/Sex: Treating RN: 21-Jan-1946 (76 y.o. [name], [name] Primary Care [name]: [name] Other Clinician: Referring Cache Decoursey: Treating Casilda Pickerill/Extender: Loreen Freud in Treatment: 12 Active Inactive Pain, Acute or Chronic Nursing Diagnoses: Pain, acute or chronic: actual or potential Potential alteration in comfort, pain Goals: Patient will verbalize adequate pain control and receive pain control interventions during procedures as needed Date Initiated: 08/06/2021 Date Inactivated: 10/01/2021 Target Resolution Date: 10/04/2021 Goal Status: Met Patient/caregiver will verbalize comfort level met Date Initiated: 08/06/2021 Target Resolution Date: 12/13/2021 Goal Status: Active Interventions: Encourage patient to take pain medications as prescribed Provide education on pain management Reposition patient for comfort Treatment Activities: Administer pain control measures as ordered : 08/06/2021 Notes: Wound/Skin Impairment Nursing Diagnoses: Knowledge deficit related to ulceration/compromised skin integrity Goals: Patient/caregiver will verbalize understanding of skin care regimen Date Initiated: 08/06/2021 Target Resolution Date: 12/13/2021 Goal Status: Active Interventions: Assess patient/caregiver ability to obtain necessary supplies Assess patient/caregiver ability to perform ulcer/skin care regimen upon admission and as needed Provide education on ulcer and skin care Treatment  Activities: Skin care regimen initiated : 08/06/2021 Topical wound management initiated : 08/06/2021 Notes: Electronic Signature(s) Signed: 10/30/2021 4:20:13 PM By: Deon Pilling RN, BSN Entered By: Deon Pilling on 10/29/2021 09:23:22 -------------------------------------------------------------------------------- Pain Assessment Details Patient Name: Date of Service: Jessica [name], [name] 10/29/2021 9:00 A M Medical Record Number: [id number] Patient Account Number: [id number] Date of Birth/Sex: Treating RN: [birth year] (76 y.o. [name] Primary Care Azyria Osmon: [name] Other Clinician: Referring Zarrah Loveland: Treating [name]/Extender: Loreen Freud in Treatment: 12 Active Problems Location of Pain Severity and Description of Pain Patient Has Paino No Site Locations Rate the pain. Current Pain Level: 0 Pain Management and Medication Current Pain Management: Medication: No Cold Application: No Rest: No Massage: No Activity: No T.E.N.S.: No Heat Application: No Leg drop or elevation: No Is the Current Pain Management Adequate: Adequate How does your wound impact your activities of daily livingo Sleep: No Bathing: No Appetite: No Relationship With Others: No Bladder Continence: No Emotions: No Bowel Continence: No Work: No  Toileting: No Drive: No Dressing: No Hobbies: No Electronic Signature(s) Signed: 10/30/2021 4:20:13 PM By: Deon Pilling RN, BSN Entered By: Deon Pilling on 10/29/2021 09:13:58 -------------------------------------------------------------------------------- Patient/Caregiver Education Details Patient Name: Date of Service: Jessica Jessica Kaufmann, [name] 7/18/2023andnbsp9:00 A M Medical Record Number: [id number] Patient Account Number: [id number] Date of Birth/Gender: Treating RN: 05/06/1945 (76 y.o. [name], [name] Primary Care Physician: [name] Other Clinician: Referring Physician: Treating  Physician/Extender: Loreen Freud in Treatment: 12 Education Assessment Education Provided To: Patient Education Topics Provided Wound/Skin Impairment: Handouts: Skin Care Do's and Dont's Methods: Explain/Verbal Responses: Reinforcements needed Electronic Signature(s) Signed: 10/30/2021 4:20:13 PM By: Deon Pilling RN, BSN Entered By: Deon Pilling on 10/29/2021 09:23:32 -------------------------------------------------------------------------------- Wound Assessment Details Patient Name: Date of Service: Jessica [name], [name] 10/29/2021 9:00 A M Medical Record Number: [id number] Patient Account Number: [id number] Date of Birth/Sex: Treating RN: [birth year] (76 y.o. [name], [name] Primary Care Keisi Eckford: [name] Other Clinician: Referring Keonna Raether: Treating Mikaelah Trostle/Extender: Loreen Freud in Treatment: 12 Wound Status Wound Number: 1 Primary Etiology: Abrasion Wound Location: Left, Anterior Lower Leg Secondary Etiology: Lymphedema Wounding Event: Trauma Wound Status: Open Date Acquired: 05/06/2021 Comorbid History: Hypertension Weeks Of Treatment: 12 Clustered Wound: No Photos Wound Measurements Length: (cm) 0.9 Width: (cm) 1.5 Depth: (cm) 0.1 Area: (cm) 1.06 Volume: (cm) 0.106 % Reduction in Area: 67.9% % Reduction in Volume: 67.9% Epithelialization: Large (67-100%) Tunneling: No Undermining: No Wound Description Classification: Full Thickness Without Exposed Support Structures Wound Margin: Distinct, outline attached Exudate Amount: Medium Exudate Type: Serosanguineous Exudate Color: red, brown Foul Odor After Cleansing: No Slough/Fibrino Yes Wound Bed Granulation Amount: Large (67-100%) Exposed Structure Granulation Quality: Red, Pink Fascia Exposed: No Necrotic Amount: Small (1-33%) Fat Layer (Subcutaneous Tissue) Exposed: Yes Necrotic Quality: Adherent Slough Tendon Exposed: No Muscle  Exposed: No Joint Exposed: No Bone Exposed: No Treatment Notes Wound #1 (Lower Leg) Wound Laterality: Left, Anterior Cleanser Soap and Water Discharge Instruction: May shower and wash wound with dial antibacterial soap and water prior to dressing change. Peri-Wound Care Triamcinolone 15 (g) Discharge Instruction: Use triamcinolone 15 (g) as directed Sween Lotion (Moisturizing lotion) Discharge Instruction: Apply moisturizing lotion as directed Topical keystone topical compounding antibiotics Discharge Instruction: will apply mupirocin in clinic and will use compounding antibiotics once it arrives at next appt time. Primary Dressing PolyMem Silver Non-Adhesive Dressing, 4.25x4.25 in Discharge Instruction: Apply to wound bed as instructed Secondary Dressing Zetuvit Plus 4x8 in Discharge Instruction: Apply over primary dressing as directed. Secured With Compression Wrap FourPress (4 layer compression wrap) Discharge Instruction: Apply four layer compression as directed. May also use Miliken CoFlex 2 layer compression system as alternative. Compression Stockings Add-Ons Electronic Signature(s) Signed: 10/30/2021 4:20:13 PM By: Deon Pilling RN, BSN Entered By: Deon Pilling on 10/29/2021 09:17:44 -------------------------------------------------------------------------------- Vitals Details Patient Name: Date of Service: Jessica [name], [name] 10/29/2021 9:00 A M Medical Record Number: [id number] Patient Account Number: [id number] Date of Birth/Sex: Treating RN: 1945/04/23 (76 y.o. [name], [name] Primary Care Maxton Noreen: [name] Other Clinician: Referring Marquesa Rath: Treating [name]/Extender: Loreen Freud in Treatment: 12 Vital Signs Time Taken: 09:10 Temperature (F): 98.9 Height (in): 63 Pulse (bpm): 81 Weight (lbs): 220 Respiratory Rate (breaths/min): 20 Body Mass Index (BMI): 39 Blood Pressure (mmHg): 136/80 Reference Range: 80 -  120 mg / dl Electronic Signature(s) Signed: 10/30/2021 4:20:13 PM By: Deon Pilling RN, BSN Entered By: Deon Pilling on 10/29/2021 09:13:51

## 2021-11-05 ENCOUNTER — Encounter (HOSPITAL_BASED_OUTPATIENT_CLINIC_OR_DEPARTMENT_OTHER): Payer: No Typology Code available for payment source | Admitting: Internal Medicine

## 2021-11-05 DIAGNOSIS — L97822 Non-pressure chronic ulcer of other part of left lower leg with fat layer exposed: Secondary | ICD-10-CM | POA: Diagnosis not present

## 2021-11-05 NOTE — Progress Notes (Signed)
[name], KAMPF ([id number]) Visit Report for 11/05/2021 Arrival Information Details Patient Name: Date of Service: [name], [place] [name] 11/05/2021 11:15 A M Medical Record Number: [id number] Patient Account Number: [id number] Date of Birth/Sex: Treating RN: [birth year] (76 y.o. [name], [name] Primary Care Baby Gieger: [name] Other Clinician: Referring Revere Maahs: Treating Merlina Marchena/Extender: Loreen Freud in Treatment: 70 Visit Information History Since Last Visit Added or deleted any medications: No Patient Arrived: [name] Any new allergies or adverse reactions: No Arrival Time: 11:03 Had a fall or experienced change in No Accompanied By: self activities of daily living that may affect Transfer Assistance: None risk of falls: Patient Identification Verified: Yes Signs or symptoms of abuse/neglect since last visito No Secondary Verification Process Completed: Yes Hospitalized since last visit: No Patient Requires Transmission-Based Precautions: No Implantable device outside of the clinic excluding No Patient Has Alerts: No cellular tissue based products placed in the center since last visit: Has Dressing in Place as Prescribed: Yes Has Compression in Place as Prescribed: Yes Pain Present Now: Yes Electronic Signature(s) Signed: 11/05/2021 4:24:01 PM By: Erenest Blank Entered By: Erenest Blank on 11/05/2021 11:04:37 -------------------------------------------------------------------------------- Compression Therapy Details Patient Name: Date of Service: [name], [name] 11/05/2021 11:15 A M Medical Record Number: [id number] Patient Account Number: [id number] Date of Birth/Sex: Treating RN: 05-Dec-1945 (76 y.o. [name], [name] Primary Care Shakai Dolley: [name] Other Clinician: Referring Mayre Bury: Treating Kiwanna Spraker/Extender: Loreen Freud in Treatment: 13 Compression Therapy Performed for Wound Assessment: Wound  #1 Left,Anterior Lower Leg Performed By: Clinician Deon Pilling, RN Compression Type: Four Layer Electronic Signature(s) Signed: 11/05/2021 4:24:01 PM By: Erenest Blank Entered By: Erenest Blank on 11/05/2021 11:21:50 -------------------------------------------------------------------------------- Encounter Discharge Information Details Patient Name: Date of Service: [name], [name] 11/05/2021 11:15 A M Medical Record Number: [id number] Patient Account Number: [id number] Date of Birth/Sex: Treating RN: 01/10/1946 (76 y.o. [name], [name] Primary Care Kashayla Ungerer: [name] Other Clinician: Referring Carlo Lorson: Treating Arcenio Mullaly/Extender: Loreen Freud in Treatment: 13 Encounter Discharge Information Items Discharge Condition: Stable Ambulatory Status: Cane Discharge Destination: Home Transportation: Private Auto Accompanied By: self Schedule Follow-up Appointment: Yes Clinical Summary of Care: Electronic Signature(s) Signed: 11/05/2021 4:24:01 PM By: Erenest Blank Entered By: Erenest Blank on 11/05/2021 11:22:25 -------------------------------------------------------------------------------- Patient/Caregiver Education Details Patient Name: Date of Service: [name], [name] 7/25/2023andnbsp11:15 A M Medical Record Number: [id number] Patient Account Number: [id number] Date of Birth/Gender: Treating RN: Jan 07, 1946 (76 y.o. [name], [name] Primary Care Physician: [name] Other Clinician: Referring Physician: Treating Physician/Extender: Loreen Freud in Treatment: 13 Education Assessment Education Provided To: Patient Education Topics Provided Electronic Signature(s) Signed: 11/05/2021 4:24:01 PM By: Erenest Blank Entered By: Erenest Blank on 11/05/2021 11:22:10 -------------------------------------------------------------------------------- Wound Assessment Details Patient Name: Date of  Service: [name], [name] 11/05/2021 11:15 A M Medical Record Number: [id number] Patient Account Number: [id number] Date of Birth/Sex: Treating RN: [birth year] (76 y.o. [name], [name] Primary Care Darragh Nay: [name] Other Clinician: Referring Roman Sandall: Treating Tonyetta Berko/Extender: Loreen Freud in Treatment: 13 Wound Status Wound Number: 1 Primary Etiology: Abrasion Wound Location: Left, Anterior Lower Leg Secondary Etiology: Lymphedema Wounding Event: Trauma Wound Status: Open Date Acquired: 05/06/2021 Weeks Of Treatment: 13 Clustered Wound: No Wound Measurements Length: (cm) 0.9 Width: (cm) 1.5 Depth: (cm) 0.1 Area: (cm) 1.06 Volume: (cm) 0.106 % Reduction in Area: 67.9% % Reduction in Volume: 67.9% Wound Description  Classification: Full Thickness Without Exposed Support Struct Exudate Amount: Medium Exudate Type: Serosanguineous Exudate Color: red, brown ures Treatment Notes Wound #1 (Lower Leg) Wound Laterality: Left, Anterior Cleanser Soap and Water Discharge Instruction: May shower and wash wound with dial antibacterial soap and water prior to dressing change. Peri-Wound Care Triamcinolone 15 (g) Discharge Instruction: Use triamcinolone 15 (g) as directed Sween Lotion (Moisturizing lotion) Discharge Instruction: Apply moisturizing lotion as directed Topical keystone topical compounding antibiotics Discharge Instruction: will apply mupirocin in clinic and will use compounding antibiotics once it arrives at next appt time. Primary Dressing PolyMem Silver Non-Adhesive Dressing, 4.25x4.25 in Discharge Instruction: Apply to wound bed as instructed Secondary Dressing Zetuvit Plus 4x8 in Discharge Instruction: Apply over primary dressing as directed. Secured With Compression Wrap FourPress (4 layer compression wrap) Discharge Instruction: Apply four layer compression as directed. May also use Miliken CoFlex 2 layer  compression system as alternative. Compression Stockings Add-Ons Electronic Signature(s) Signed: 11/05/2021 4:24:01 PM By: Erenest Blank Signed: 11/05/2021 5:12:34 PM By: Deon Pilling RN, BSN Entered By: Erenest Blank on 11/05/2021 11:08:04 -------------------------------------------------------------------------------- Vitals Details Patient Name: Date of Service: [name], [name] 11/05/2021 11:15 A M Medical Record Number: [id number] Patient Account Number: [id number] Date of Birth/Sex: Treating RN: 1945/11/05 (76 y.o. [name], [name] Primary Care Katerina Zurn: [name] Other Clinician: Referring Arianni Gallego: Treating Joquan Lotz/Extender: Loreen Freud in Treatment: 13 Vital Signs Time Taken: 11:07 Temperature (F): 98.4 Height (in): 63 Pulse (bpm): 93 Weight (lbs): 220 Respiratory Rate (breaths/min): 18 Body Mass Index (BMI): 39 Blood Pressure (mmHg): 135/78 Reference Range: 80 - 120 mg / dl Electronic Signature(s) Signed: 11/05/2021 4:24:01 PM By: Erenest Blank Entered By: Erenest Blank on 11/05/2021 11:07:50

## 2021-11-05 NOTE — Progress Notes (Signed)
ELVERIA, LAUDERBAUGH ([id number]) Visit Report for 11/05/2021 SuperBill Details Patient Name: Date of Service: [name] 11/05/2021 Medical Record Number: [id number] Patient Account Number: [id number] Date of Birth/Sex: Treating RN: 04-20-1945 (76 y.o. [name] Primary Care Provider: [name] Other Clinician: Referring Provider: Treating Provider/Extender: Loreen Freud in Treatment: 13 Diagnosis Coding ICD-10 Codes Code Description [phone] Non-pressure chronic ulcer of other part of left lower leg with fat layer exposed T79.9XXA Unspecified early complication of trauma, initial encounter I10 Essential (primary) hypertension I89.0 Lymphedema, not elsewhere classified I87.2 Venous insufficiency (chronic) (peripheral) C50.919 Malignant neoplasm of unspecified site of unspecified female breast Facility Procedures CPT4 Code Description Modifier Quantity [id number] (Facility Use Only) [phone] - APPLY [place] 1 Electronic Signature(s) Signed: 11/05/2021 12:07:41 PM By: Kalman Shan DO Signed: 11/05/2021 4:24:01 PM By: Erenest Blank Entered By: Erenest Blank on 11/05/2021 11:23:43

## 2021-11-12 ENCOUNTER — Encounter (HOSPITAL_BASED_OUTPATIENT_CLINIC_OR_DEPARTMENT_OTHER): Payer: No Typology Code available for payment source | Attending: Internal Medicine | Admitting: Internal Medicine

## 2021-11-12 DIAGNOSIS — L97822 Non-pressure chronic ulcer of other part of left lower leg with fat layer exposed: Secondary | ICD-10-CM | POA: Insufficient documentation

## 2021-11-12 DIAGNOSIS — I890 Lymphedema, not elsewhere classified: Secondary | ICD-10-CM | POA: Insufficient documentation

## 2021-11-12 DIAGNOSIS — I10 Essential (primary) hypertension: Secondary | ICD-10-CM | POA: Diagnosis not present

## 2021-11-12 DIAGNOSIS — G8929 Other chronic pain: Secondary | ICD-10-CM | POA: Insufficient documentation

## 2021-11-12 DIAGNOSIS — I872 Venous insufficiency (chronic) (peripheral): Secondary | ICD-10-CM | POA: Insufficient documentation

## 2021-11-12 DIAGNOSIS — T799XXA Unspecified early complication of trauma, initial encounter: Secondary | ICD-10-CM

## 2021-11-12 NOTE — Progress Notes (Signed)
Jessica, [name] ([id number]) Visit Report for 11/12/2021 Arrival Information Details Patient Name: Date of Service: [name], [name] 11/12/2021 8:15 A M Medical Record Number: [id number] Patient Account Number: [id number] Date of Birth/Sex: Treating RN: 12-29-1945 (76 y.o. [name], [name] Primary Care Aliveah Gallant: [name] Other Clinician: Referring Rockey Guarino: Treating Raydell Maners/Extender: Loreen Freud in Treatment: 14 Visit Information History Since Last Visit Added or deleted any medications: No Patient Arrived: Ambulatory Any new allergies or adverse reactions: No Arrival Time: 07:58 Had a fall or experienced change in No Accompanied By: self activities of daily living that may affect Transfer Assistance: None risk of falls: Patient Identification Verified: Yes Signs or symptoms of abuse/neglect since last visito No Secondary Verification Process Completed: Yes Hospitalized since last visit: No Patient Requires Transmission-Based Precautions: No Implantable device outside of the clinic excluding No Patient Has Alerts: No cellular tissue based products placed in the center since last visit: Has Dressing in Place as Prescribed: Yes Has Compression in Place as Prescribed: Yes Pain Present Now: No Electronic Signature(s) Signed: 11/12/2021 4:41:40 PM By: Deon Pilling RN, BSN Entered By: Deon Pilling on 11/12/2021 08:01:39 -------------------------------------------------------------------------------- Compression Therapy Details Patient Name: Date of Service: [name], Jessica [name] 11/12/2021 8:15 A M Medical Record Number: [id number] Patient Account Number: [id number] Date of Birth/Sex: Treating RN: 06/28/1945 (76 y.o. [name] Primary Care Nello Corro: [name] Other Clinician: Referring Ladon Vandenberghe: Treating Mitzi Lilja/Extender: Loreen Freud in Treatment: 14 Compression Therapy Performed for Wound Assessment:  Wound #1 Left,Anterior Lower Leg Performed By: Clinician Deon Pilling, RN Compression Type: Double Layer Post Procedure Diagnosis Same as Pre-procedure Electronic Signature(s) Signed: 11/12/2021 4:41:40 PM By: Deon Pilling RN, BSN Entered By: Deon Pilling on 11/12/2021 08:08:13 -------------------------------------------------------------------------------- Encounter Discharge Information Details Patient Name: Date of Service: [name], Jessica [name] 11/12/2021 8:15 A M Medical Record Number: [id number] Patient Account Number: [id number] Date of Birth/Sex: Treating RN: 12-15-1945 (76 y.o. [name] Primary Care Amorita Vanrossum: [name] Other Clinician: Referring Mariela Rex: Treating Danniela Mcbrearty/Extender: Loreen Freud in Treatment: 14 Encounter Discharge Information Items Discharge Condition: Stable Ambulatory Status: Cane Discharge Destination: Home Transportation: Private Auto Accompanied By: self Schedule Follow-up Appointment: Yes Clinical Summary of Care: Electronic Signature(s) Signed: 11/12/2021 4:41:40 PM By: Deon Pilling RN, BSN Entered By: Deon Pilling on 11/12/2021 08:28:43 -------------------------------------------------------------------------------- Lower Extremity Assessment Details Patient Name: Date of Service: [name], Jessica [name] 11/12/2021 8:15 A M Medical Record Number: [id number] Patient Account Number: [id number] Date of Birth/Sex: Treating RN: [birth year] (76 y.o. [name] Primary Care Claudia Alvizo: [name] Other Clinician: Referring Harce Volden: Treating Atoya Andrew/Extender: Loreen Freud in Treatment: 14 Edema Assessment Assessed: [name]: Yes] [name]: No] Edema: [Left: Ye] [Right: s] Calf Left: Right: Point of Measurement: 30 cm From Medial Instep 41.3 cm Ankle Left: Right: Point of Measurement: 9 cm From Medial Instep 25.5 cm Vascular Assessment Pulses: Dorsalis Pedis Palpable:  [Left:Yes] Electronic Signature(s) Signed: 11/12/2021 4:41:40 PM By: Deon Pilling RN, BSN Entered By: Deon Pilling on 11/12/2021 08:05:36 -------------------------------------------------------------------------------- Multi Wound Chart Details Patient Name: Date of Service: [name], Jessica [name] 11/12/2021 8:15 A M Medical Record Number: [id number] Patient Account Number: [id number] Date of Birth/Sex: Treating RN: [birth year] (76 y.o. [name] Primary Care Camisha Srey: [name] Other Clinician: Referring Dalton Molesworth: Treating Amulya Quintin/Extender: Loreen Freud in Treatment: 14 Vital Signs Height(in): 63 Pulse(bpm): 86 Weight(lbs): 220 Blood  Pressure(mmHg): 163/80 Body Mass Index(BMI): 39 Temperature(F): 97.9 Respiratory Rate(breaths/min): 20 Photos: [N/A:N/A] Left, Anterior Lower Leg N/A N/A Wound Location: Trauma N/A N/A Wounding Event: Abrasion N/A N/A Primary Etiology: Lymphedema N/A N/A Secondary Etiology: Hypertension N/A N/A Comorbid History: 05/06/2021 N/A N/A Date Acquired: 14 N/A N/A Weeks of Treatment: Open N/A N/A Wound Status: No N/A N/A Wound Recurrence: 0.4x0.8x0.1 N/A N/A Measurements L x W x D (cm) 0.251 N/A N/A A (cm) : rea 0.025 N/A N/A Volume (cm) : 92.40% N/A N/A % Reduction in A rea: 92.40% N/A N/A % Reduction in Volume: Full Thickness Without Exposed N/A N/A Classification: Support Structures Small N/A N/A Exudate Amount: Serosanguineous N/A N/A Exudate Type: red, brown N/A N/A Exudate Color: Distinct, outline attached N/A N/A Wound Margin: Large (67-100%) N/A N/A Granulation Amount: Red N/A N/A Granulation Quality: None Present (0%) N/A N/A Necrotic Amount: Fat Layer (Subcutaneous Tissue): Yes N/A N/A Exposed Structures: Fascia: No Tendon: No Muscle: No Joint: No Bone: No Large (67-100%) N/A N/A Epithelialization: Compression Therapy N/A N/A Procedures Performed: Treatment  Notes Wound #1 (Lower Leg) Wound Laterality: Left, Anterior Cleanser Soap and Water Discharge Instruction: May shower and wash wound with dial antibacterial soap and water prior to dressing change. Peri-Wound Care Triamcinolone 15 (g) Discharge Instruction: Use triamcinolone 15 (g) as directed Sween Lotion (Moisturizing lotion) Discharge Instruction: Apply moisturizing lotion as directed Topical Primary Dressing PolyMem Silver Non-Adhesive Dressing, 4.25x4.25 in Discharge Instruction: Apply to wound bed as instructed Secondary Dressing Zetuvit Plus 4x8 in Discharge Instruction: Apply over primary dressing as directed. Secured With Compression Wrap FourPress (4 layer compression wrap) Discharge Instruction: Apply four layer compression as directed. May also use Miliken CoFlex 2 layer compression system as alternative. Compression Stockings Add-Ons Electronic Signature(s) Signed: 11/12/2021 8:57:46 AM By: Kalman Shan DO Signed: 11/12/2021 4:41:40 PM By: Deon Pilling RN, BSN Entered By: Kalman Shan on 11/12/2021 08:55:30 -------------------------------------------------------------------------------- Multi-Disciplinary Care Plan Details Patient Name: Date of Service: [name], Jessica [name] 11/12/2021 8:15 A M Medical Record Number: [id number] Patient Account Number: [id number] Date of Birth/Sex: Treating RN: 1945-09-06 (76 y.o. [name], [name] Primary Care Brees Hounshell: [name] Other Clinician: Referring Ketan Renz: Treating Romani Wilbon/Extender: Loreen Freud in Treatment: 14 Active Inactive Pain, Acute or Chronic Nursing Diagnoses: Pain, acute or chronic: actual or potential Potential alteration in comfort, pain Goals: Patient will verbalize adequate pain control and receive pain control interventions during procedures as needed Date Initiated: 08/06/2021 Date Inactivated: 10/01/2021 Target Resolution Date: 10/04/2021 Goal Status:  Met Patient/caregiver will verbalize comfort level met Date Initiated: 08/06/2021 Target Resolution Date: 12/13/2021 Goal Status: Active Interventions: Encourage patient to take pain medications as prescribed Provide education on pain management Reposition patient for comfort Treatment Activities: Administer pain control measures as ordered : 08/06/2021 Notes: Wound/Skin Impairment Nursing Diagnoses: Knowledge deficit related to ulceration/compromised skin integrity Goals: Patient/caregiver will verbalize understanding of skin care regimen Date Initiated: 08/06/2021 Target Resolution Date: 12/13/2021 Goal Status: Active Interventions: Assess patient/caregiver ability to obtain necessary supplies Assess patient/caregiver ability to perform ulcer/skin care regimen upon admission and as needed Provide education on ulcer and skin care Treatment Activities: Skin care regimen initiated : 08/06/2021 Topical wound management initiated : 08/06/2021 Notes: Electronic Signature(s) Signed: 11/12/2021 4:41:40 PM By: Deon Pilling RN, BSN Entered By: Deon Pilling on 11/12/2021 08:27:24 -------------------------------------------------------------------------------- Pain Assessment Details Patient Name: Date of Service: [name], Jessica [name] 11/12/2021 8:15 A M Medical Record Number: [id number] Patient Account Number: [id number] Date of Birth/Sex: Treating RN:  Aug 21, 1945 (76 y.o. [name] Primary Care Jolly Carlini: [name] Other Clinician: Referring Marlow Berenguer: Treating Karen Huhta/Extender: Loreen Freud in Treatment: 14 Active Problems Location of Pain Severity and Description of Pain Patient Has Paino No Site Locations Rate the pain. Current Pain Level: 0 Pain Management and Medication Current Pain Management: Medication: No Cold Application: No Rest: No Massage: No Activity: No T.E.N.S.: No Heat Application: No Leg drop or elevation: No Is the  Current Pain Management Adequate: Adequate How does your wound impact your activities of daily livingo Sleep: No Bathing: No Appetite: No Relationship With Others: No Bladder Continence: No Emotions: No Bowel Continence: No Work: No Toileting: No Drive: No Dressing: No Hobbies: No Engineer, maintenance) Signed: 11/12/2021 4:41:40 PM By: Deon Pilling RN, BSN Entered By: Deon Pilling on 11/12/2021 08:01:58 -------------------------------------------------------------------------------- Patient/Caregiver Education Details Patient Name: Date of Service: [name], Jessica [name] 8/1/2023andnbsp8:15 A M Medical Record Number: [id number] Patient Account Number: [id number] Date of Birth/Gender: Treating RN: January 09, 1946 (76 y.o. [name] Primary Care Physician: [name] Other Clinician: Referring Physician: Treating Physician/Extender: Loreen Freud in Treatment: 14 Education Assessment Education Provided To: Patient Education Topics Provided Wound/Skin Impairment: Handouts: Skin Care Do's and Dont's Methods: Explain/Verbal Responses: Reinforcements needed Electronic Signature(s) Signed: 11/12/2021 4:41:40 PM By: Deon Pilling RN, BSN Entered By: Deon Pilling on 11/12/2021 08:27:39 -------------------------------------------------------------------------------- Wound Assessment Details Patient Name: Date of Service: [name], Jessica [name] 11/12/2021 8:15 A M Medical Record Number: [id number] Patient Account Number: [id number] Date of Birth/Sex: Treating RN: 12/24/1945 (76 y.o. [name], [name] Primary Care Assyria Morreale: [name] Other Clinician: Referring Estle Sabella: Treating Alexa Golebiewski/Extender: Loreen Freud in Treatment: 14 Wound Status Wound Number: 1 Primary Etiology: Abrasion Wound Location: Left, Anterior Lower Leg Secondary Etiology: Lymphedema Wounding Event: Trauma Wound Status: Open Date Acquired:  05/06/2021 Comorbid History: Hypertension Weeks Of Treatment: 14 Clustered Wound: No Photos Wound Measurements Length: (cm) 0.4 Width: (cm) 0.8 Depth: (cm) 0.1 Area: (cm) 0.251 Volume: (cm) 0.025 % Reduction in Area: 92.4% % Reduction in Volume: 92.4% Epithelialization: Large (67-100%) Tunneling: No Undermining: No Wound Description Classification: Full Thickness Without Exposed Support Structures Wound Margin: Distinct, outline attached Exudate Amount: Small Exudate Type: Serosanguineous Exudate Color: red, brown Foul Odor After Cleansing: No Slough/Fibrino No Wound Bed Granulation Amount: Large (67-100%) Exposed Structure Granulation Quality: Red Fascia Exposed: No Necrotic Amount: None Present (0%) Fat Layer (Subcutaneous Tissue) Exposed: Yes Tendon Exposed: No Muscle Exposed: No Joint Exposed: No Bone Exposed: No Treatment Notes Wound #1 (Lower Leg) Wound Laterality: Left, Anterior Cleanser Soap and Water Discharge Instruction: May shower and wash wound with dial antibacterial soap and water prior to dressing change. Peri-Wound Care Triamcinolone 15 (g) Discharge Instruction: Use triamcinolone 15 (g) as directed Sween Lotion (Moisturizing lotion) Discharge Instruction: Apply moisturizing lotion as directed Topical Primary Dressing PolyMem Silver Non-Adhesive Dressing, 4.25x4.25 in Discharge Instruction: Apply to wound bed as instructed Secondary Dressing Zetuvit Plus 4x8 in Discharge Instruction: Apply over primary dressing as directed. Secured With Compression Wrap FourPress (4 layer compression wrap) Discharge Instruction: Apply four layer compression as directed. May also use Miliken CoFlex 2 layer compression system as alternative. Compression Stockings Add-Ons Electronic Signature(s) Signed: 11/12/2021 4:41:40 PM By: Deon Pilling RN, BSN Entered By: Deon Pilling on 11/12/2021  08:06:50 -------------------------------------------------------------------------------- Vitals Details Patient Name: Date of Service: [name], Jessica [name] 11/12/2021 8:15 A M Medical Record Number: [id number] Patient Account Number: [id number] Date of  Birth/Sex: Treating RN: Mar 09, 1946 (76 y.o. [name], [name] Primary Care Henri Guedes: [name] Other Clinician: Referring Janeene Sand: Treating Jeslynn Hollander/Extender: Loreen Freud in Treatment: 14 Vital Signs Time Taken: 08:00 Temperature (F): 97.9 Height (in): 63 Pulse (bpm): 86 Weight (lbs): 220 Respiratory Rate (breaths/min): 20 Body Mass Index (BMI): 39 Blood Pressure (mmHg): 163/80 Reference Range: 80 - 120 mg / dl Electronic Signature(s) Signed: 11/12/2021 4:41:40 PM By: Deon Pilling RN, BSN Entered By: Deon Pilling on 11/12/2021 08:01:51

## 2021-11-12 NOTE — Progress Notes (Signed)
Jessica, [name] ([id number]) Visit Report for 11/12/2021 Chief Complaint Document Details Patient Name: Date of Service: [name], Page [name] 11/12/2021 8:15 A M Medical Record Number: [id number] Patient Account Number: [id number] Date of Birth/Sex: Treating RN: 03/04/1946 (76 y.o. [name], [name] Primary Care Provider: [name] Other Clinician: Referring Provider: Treating Provider/Extender: Loreen Freud in Treatment: 14 Information Obtained from: Patient Chief Complaint 08/06/2021; left lower extremity wound following trauma Electronic Signature(s) Signed: 11/12/2021 8:57:46 AM By: Kalman Shan DO Entered By: Kalman Shan on 11/12/2021 08:55:37 -------------------------------------------------------------------------------- HPI Details Patient Name: Date of Service: [name], Jessica [name] 11/12/2021 8:15 A M Medical Record Number: [id number] Patient Account Number: [id number] Date of Birth/Sex: Treating RN: 1945/10/13 (76 y.o. [name] Primary Care Provider: [name] Other Clinician: Referring Provider: Treating Provider/Extender: Loreen Freud in Treatment: 14 History of Present Illness HPI Description: Admission/20 08/2021 [name] is a 76 year old female with a past medical history of breast cancer status post bilateral mastectomy that presents the clinic for a 43-monthhistory of nonhealing ulcer to the left lower extremity. On 05/06/2021 while at work she hit her leg against the corner of a desk experiencing a decent laceration. She went to the ED and had sutures placed. These have since been removed. She has been on 2 rounds of antibiotics including Keflex and Bactrim for this issue. She reports chronic pain to the wound site. She has been using Xeroform with dressing changes. She denies purulent drainage. 5/1; patient presents for follow-up. She started using gentamicin ointment and Hydrofera Blue  over the past week. She reports improvement in her chronic pain. She has compression stockings but it is unclear if she is wearing these daily. She currently denies systemic signs of infection. She had a PCR culture done at last clinic visit that showed high levels of Staph aureus. Keystone antibiotics has been ordered and she states she is receiving this soon. 5/9; patient presents for follow-up. She tolerated the compression wrap well. We have been using gentamicin and Hydrofera Blue under the wrap. She is still not obtained her Keystone antibiotics. She states she will try to get this soon. She denies signs of infection. 5/16; patient presents for follow-up. She states she is obtaining Keystone antibiotics today. She has no issues or complaints today. She tolerated the compression wrap well. We did in office ABIs and they were 0.97 on the left. 5/23; patient presents for follow-up. She has her Keystone antibiotics with her today. She has no issues or complaints. She tolerated the compression wrap well. 5/30; patient presents for follow-up. We have been using Keystone antibiotic with Sorbact under compression wrap. She has no issues or complaints today. She denies signs of infection. 6/6; Keystone with Iodoflex as of last week. Per our intake nurse everything looks a little better she is using 3 layer compression 6/13; patient presents for follow-up. She has been using Keystone with Iodoflex under compression. She was increased to 4 layer at last clinic visit. 6/22; patient presents for follow-up. We have been using Keystone antibiotics with Iodoflex under compression. She has no issues or complaints today. 6/27; patient presents for follow-up. We have been using Keystone antibiotics with PolyMem silver under compression for the past week. She has no issues or complaints today. 7/6; patient presents for follow-up. We have been using Keystone antibiotic with PolyMem silver under the compression wrap  with no issues. Patient reports improvement in wound healing. 7/11;  patient presents for follow-up. We have been using Keystone along with PolyMem silver under compression wrap. Patient has no issues or complaints today. 7/18; patient presents for follow-up. We continue to use [facility] with PolyMem silver under compression therapy. There is continued wound healing. Patient has no issues or complaints today. 8/1; patient presents for follow-up. We have been using Keystone with PolyMem silver under compression therapy. Patient has no issues or complaints today. Electronic Signature(s) Signed: 11/12/2021 8:57:46 AM By: Kalman Shan DO Entered By: Kalman Shan on 11/12/2021 08:56:01 -------------------------------------------------------------------------------- Physical Exam Details Patient Name: Date of Service: [name], Jessica [name] 11/12/2021 8:15 A M Medical Record Number: [id number] Patient Account Number: [id number] Date of Birth/Sex: Treating RN: 22-Aug-1945 (76 y.o. [name] Primary Care Provider: [name] Other Clinician: Referring Provider: Treating Provider/Extender: Loreen Freud in Treatment: 14 Constitutional respirations regular, non-labored and within target range for patient.. Cardiovascular 2+ dorsalis pedis/posterior tibialis pulses. Psychiatric pleasant and cooperative. Notes T the anterior aspect there is an open wound with granulation tissue. No signs of surrounding infection. Good edema control. o Electronic Signature(s) Signed: 11/12/2021 8:57:46 AM By: Kalman Shan DO Entered By: Kalman Shan on 11/12/2021 08:56:28 -------------------------------------------------------------------------------- Physician Orders Details Patient Name: Date of Service: [name], Jessica [name] 11/12/2021 8:15 A M Medical Record Number: [id number] Patient Account Number: [id number] Date of Birth/Sex: Treating RN: Feb 25, 1946 (76 y.o. [name], [name] Primary Care Provider: [name] Other Clinician: Referring Provider: Treating Provider/Extender: Loreen Freud in Treatment: [phone] Verbal / Phone Orders: No Diagnosis Coding Follow-up Appointments ppointment in 1 week. - Dr. Heber [place] and [place], Room 8 Tuesday 11/19/2021 0815 Return A Other: - [employer]- will send out wound care supplies Keystone topical antibiotic bring in at each appt time. Bathing/ Shower/ Hygiene May shower and wash wound with soap and water. Edema Control - Lymphedema / SCD / Other Elevate legs to the level of the heart or above for 30 minutes daily and/or when sitting, a frequency of: - 3-4 times a day throughout the day. Avoid standing for long periods of time. Exercise regularly Moisturize legs daily. - both legs every night before bed. Wound Treatment Wound #1 - Lower Leg Wound Laterality: Left, Anterior Cleanser: Soap and Water 1 x Per Week/30 Days Discharge Instructions: May shower and wash wound with dial antibacterial soap and water prior to dressing change. Peri-Wound Care: Triamcinolone 15 (g) 1 x Per Week/30 Days Discharge Instructions: Use triamcinolone 15 (g) as directed Peri-Wound Care: Sween Lotion (Moisturizing lotion) 1 x Per Week/30 Days Discharge Instructions: Apply moisturizing lotion as directed Prim Dressing: PolyMem Silver Non-Adhesive Dressing, 4.25x4.25 in 1 x Per Week/30 Days ary Discharge Instructions: Apply to wound bed as instructed Secondary Dressing: Zetuvit Plus 4x8 in 1 x Per Week/30 Days Discharge Instructions: Apply over primary dressing as directed. Compression Wrap: FourPress (4 layer compression wrap) 1 x Per Week/30 Days Discharge Instructions: Apply four layer compression as directed. May also use Miliken CoFlex 2 layer compression system as alternative. Electronic Signature(s) Signed: 11/12/2021 8:57:46 AM By: Kalman Shan DO Entered By: Kalman Shan on  11/12/2021 08:56:39 -------------------------------------------------------------------------------- Problem List Details Patient Name: Date of Service: [name], Jessica [name] 11/12/2021 8:15 A M Medical Record Number: [id number] Patient Account Number: [id number] Date of Birth/Sex: Treating RN: [birth year] (76 y.o. [name], [name] Primary Care Provider: [name] Other Clinician: Referring Provider: Treating Provider/Extender: Loreen Freud in Treatment: [phone]  Active Problems ICD-10 Encounter Code Description Active Date MDM Diagnosis L97.822 Non-pressure chronic ulcer of other part of left lower leg with fat layer exposed5/12/2021 No Yes T79.9XXA Unspecified early complication of trauma, initial encounter 08/06/2021 No Yes I10 Essential (primary) hypertension 08/06/2021 No Yes I89.0 Lymphedema, not elsewhere classified 08/06/2021 No Yes I87.2 Venous insufficiency (chronic) (peripheral) 08/06/2021 No Yes C50.919 Malignant neoplasm of unspecified site of unspecified female breast 08/06/2021 No Yes Inactive Problems Resolved Problems Electronic Signature(s) Signed: 11/12/2021 8:57:46 AM By: Kalman Shan DO Entered By: Kalman Shan on 11/12/2021 08:55:09 -------------------------------------------------------------------------------- Progress Note Details Patient Name: Date of Service: [name], Jessica [name] 11/12/2021 8:15 A M Medical Record Number: [id number] Patient Account Number: [id number] Date of Birth/Sex: Treating RN: 1946-02-13 (76 y.o. [name] Primary Care Provider: [name] Other Clinician: Referring Provider: Treating Provider/Extender: Loreen Freud in Treatment: 14 Subjective Chief Complaint Information obtained from Patient 08/06/2021; left lower extremity wound following trauma History of Present Illness (HPI) Admission/20 08/2021 Ms. Znya Albino is a 76 year old female with a past medical  history of breast cancer status post bilateral mastectomy that presents the clinic for a 11-monthhistory of nonhealing ulcer to the left lower extremity. On 05/06/2021 while at work she hit her leg against the corner of a desk experiencing a decent laceration. She went to the ED and had sutures placed. These have since been removed. She has been on 2 rounds of antibiotics including Keflex and Bactrim for this issue. She reports chronic pain to the wound site. She has been using Xeroform with dressing changes. She denies purulent drainage. 5/1; patient presents for follow-up. She started using gentamicin ointment and Hydrofera Blue over the past week. She reports improvement in her chronic pain. She has compression stockings but it is unclear if she is wearing these daily. She currently denies systemic signs of infection. She had a PCR culture done at last clinic visit that showed high levels of Staph aureus. Keystone antibiotics has been ordered and she states she is receiving this soon. 5/9; patient presents for follow-up. She tolerated the compression wrap well. We have been using gentamicin and Hydrofera Blue under the wrap. She is still not obtained her Keystone antibiotics. She states she will try to get this soon. She denies signs of infection. 5/16; patient presents for follow-up. She states she is obtaining Keystone antibiotics today. She has no issues or complaints today. She tolerated the compression wrap well. We did in office ABIs and they were 0.97 on the left. 5/23; patient presents for follow-up. She has her Keystone antibiotics with her today. She has no issues or complaints. She tolerated the compression wrap well. 5/30; patient presents for follow-up. We have been using Keystone antibiotic with Sorbact under compression wrap. She has no issues or complaints today. She denies signs of infection. 6/6; Keystone with Iodoflex as of last week. Per our intake nurse everything looks a little  better she is using 3 layer compression 6/13; patient presents for follow-up. She has been using Keystone with Iodoflex under compression. She was increased to 4 layer at last clinic visit. 6/22; patient presents for follow-up. We have been using Keystone antibiotics with Iodoflex under compression. She has no issues or complaints today. 6/27; patient presents for follow-up. We have been using Keystone antibiotics with PolyMem silver under compression for the past week. She has no issues or complaints today. 7/6; patient presents for follow-up. We have been using Keystone antibiotic with PolyMem silver under  the compression wrap with no issues. Patient reports improvement in wound healing. 7/11; patient presents for follow-up. We have been using Keystone along with PolyMem silver under compression wrap. Patient has no issues or complaints today. 7/18; patient presents for follow-up. We continue to use [facility] with PolyMem silver under compression therapy. There is continued wound healing. Patient has no issues or complaints today. 8/1; patient presents for follow-up. We have been using Keystone with PolyMem silver under compression therapy. Patient has no issues or complaints today. Patient History Family History Cancer - Father, Diabetes - Father, Heart Disease - Father, Hypertension - Father. Social History Former smoker - quit 1998, Marital Status - Married, Alcohol Use - Never, Drug Use - No History, Caffeine Use - Never. Medical History Cardiovascular Patient has history of Hypertension Hospitalization/Surgery History - eyes surgery. - tonsillectomy. - hysterectomy. - bilateral mastectomy. - deviated septum from a pear falling from a tree.. Medical A Surgical History Notes nd Constitutional Symptoms (General Health) Bilateral mastectomy Breast Cancer Vitamin D deficiency Cardiovascular Ventricular tachycardia Objective Constitutional respirations regular, non-labored and within  target range for patient.. Vitals Time Taken: 8:00 AM, Height: 63 in, Weight: 220 lbs, BMI: 39, Temperature: 97.9 F, Pulse: 86 bpm, Respiratory Rate: 20 breaths/min, Blood Pressure: 163/80 mmHg. Cardiovascular 2+ dorsalis pedis/posterior tibialis pulses. Psychiatric pleasant and cooperative. General Notes: T the anterior aspect there is an open wound with granulation tissue. No signs of surrounding infection. Good edema control. o Integumentary (Hair, Skin) Wound #1 status is Open. Original cause of wound was Trauma. The date acquired was: 05/06/2021. The wound has been in treatment 14 weeks. The wound is located on the Left,Anterior Lower Leg. The wound measures 0.4cm length x 0.8cm width x 0.1cm depth; 0.251cm^2 area and 0.025cm^3 volume. There is Fat Layer (Subcutaneous Tissue) exposed. There is no tunneling or undermining noted. There is a small amount of serosanguineous drainage noted. The wound margin is distinct with the outline attached to the wound base. There is large (67-100%) red granulation within the wound bed. There is no necrotic tissue within the wound bed. Assessment Active Problems ICD-10 Non-pressure chronic ulcer of other part of left lower leg with fat layer exposed Unspecified early complication of trauma, initial encounter Essential (primary) hypertension Lymphedema, not elsewhere classified Venous insufficiency (chronic) (peripheral) Malignant neoplasm of unspecified site of unspecified female breast Patient's wound has shown improvement in size and appearance since last clinic visit. I think we can stop Keystone antibiotics at this time. Continue with PolyMem silver under compression therapy. Follow-up in 1 week. Procedures Wound #1 Pre-procedure diagnosis of Wound #1 is an Abrasion located on the Left,Anterior Lower Leg . There was a Double Layer Compression Therapy Procedure by Deon Pilling, RN. Post procedure Diagnosis Wound #1: Same as  Pre-Procedure Plan Follow-up Appointments: Return Appointment in 1 week. - Dr. Heber [place] and [place], Room 8 Tuesday 11/19/2021 0815 Other: [employer]- will send out wound care supplies Keystone topical antibiotic bring in at each appt time. Bathing/ Shower/ Hygiene: May shower and wash wound with soap and water. Edema Control - Lymphedema / SCD / Other: Elevate legs to the level of the heart or above for 30 minutes daily and/or when sitting, a frequency of: - 3-4 times a day throughout the day. Avoid standing for long periods of time. Exercise regularly Moisturize legs daily. - both legs every night before bed. WOUND #1: - Lower Leg Wound Laterality: Left, Anterior Cleanser: Soap and Water 1 x Per Week/30 Days Discharge Instructions:  May shower and wash wound with dial antibacterial soap and water prior to dressing change. Peri-Wound Care: Triamcinolone 15 (g) 1 x Per Week/30 Days Discharge Instructions: Use triamcinolone 15 (g) as directed Peri-Wound Care: Sween Lotion (Moisturizing lotion) 1 x Per Week/30 Days Discharge Instructions: Apply moisturizing lotion as directed Prim Dressing: PolyMem Silver Non-Adhesive Dressing, 4.25x4.25 in 1 x Per Week/30 Days ary Discharge Instructions: Apply to wound bed as instructed Secondary Dressing: Zetuvit Plus 4x8 in 1 x Per Week/30 Days Discharge Instructions: Apply over primary dressing as directed. Com pression Wrap: FourPress (4 layer compression wrap) 1 x Per Week/30 Days Discharge Instructions: Apply four layer compression as directed. May also use Miliken CoFlex 2 layer compression system as alternative. 1. PolyMem silver under 4-layer compression 2. Follow-up in 1 week Electronic Signature(s) Signed: 11/12/2021 8:57:46 AM By: Kalman Shan DO Entered By: Kalman Shan on 11/12/2021 08:57:06 -------------------------------------------------------------------------------- HxROS Details Patient Name: Date of Service: [name], Jessica  [name] 11/12/2021 8:15 A M Medical Record Number: [id number] Patient Account Number: [id number] Date of Birth/Sex: Treating RN: 1946-03-15 (76 y.o. [name], [name] Primary Care Provider: [name] Other Clinician: Referring Provider: Treating Provider/Extender: Loreen Freud in Treatment: 14 Constitutional Symptoms (General Health) Medical History: Past Medical History Notes: Bilateral mastectomy Breast Cancer Vitamin D deficiency Cardiovascular Medical History: Positive for: Hypertension Past Medical History Notes: Ventricular tachycardia Immunizations Pneumococcal Vaccine: Received Pneumococcal Vaccination: No Implantable Devices No devices added Hospitalization / Surgery History Type of Hospitalization/Surgery eyes surgery tonsillectomy hysterectomy bilateral mastectomy deviated septum from a pear falling from a tree. Family and Social History Cancer: Yes - Father; Diabetes: Yes - Father; Heart Disease: Yes - Father; Hypertension: Yes - Father; Former smoker - quit 1998; Marital Status - Married; Alcohol Use: Never; Drug Use: No History; Caffeine Use: Never; Financial Concerns: No; Food, Clothing or Shelter Needs: No; Support System Lacking: No; Transportation Concerns: No Electronic Signature(s) Signed: 11/12/2021 8:57:46 AM By: Kalman Shan DO Signed: 11/12/2021 4:41:40 PM By: Deon Pilling RN, BSN Entered By: Kalman Shan on 11/12/2021 08:56:06 -------------------------------------------------------------------------------- SuperBill Details Patient Name: Date of Service: [name], Jessica [name] 11/12/2021 Medical Record Number: [id number] Patient Account Number: [id number] Date of Birth/Sex: Treating RN: December 31, 1945 (76 y.o. [name] Primary Care Provider: [name] Other Clinician: Referring Provider: Treating Provider/Extender: Loreen Freud in Treatment: 14 Diagnosis Coding ICD-10  Codes Code Description [phone] Non-pressure chronic ulcer of other part of left lower leg with fat layer exposed T79.9XXA Unspecified early complication of trauma, initial encounter I10 Essential (primary) hypertension I89.0 Lymphedema, not elsewhere classified I87.2 Venous insufficiency (chronic) (peripheral) C50.919 Malignant neoplasm of unspecified site of unspecified female breast Facility Procedures CPT4 Code: [id number] ( Description: Facility Use Only) [phone] - [facility] LT LEG Modifier: Quantity: 1 Physician Procedures : CPT4 Code Description Modifier [id number] - WC PHYS LEVEL 3 - EST PT ICD-10 Diagnosis Description L97.822 Non-pressure chronic ulcer of other part of left lower leg with fat layer exposed I89.0 Lymphedema, not elsewhere classified I87.2 Venous  insufficiency (chronic) (peripheral) T79.9XXA Unspecified early complication of trauma, initial encounter Quantity: 1 Electronic Signature(s) Signed: 11/12/2021 8:57:46 AM By: Kalman Shan DO Entered By: Kalman Shan on 11/12/2021 08:57:30

## 2021-11-19 ENCOUNTER — Encounter (HOSPITAL_BASED_OUTPATIENT_CLINIC_OR_DEPARTMENT_OTHER): Payer: No Typology Code available for payment source | Admitting: Internal Medicine

## 2021-11-19 DIAGNOSIS — I872 Venous insufficiency (chronic) (peripheral): Secondary | ICD-10-CM

## 2021-11-19 DIAGNOSIS — L97822 Non-pressure chronic ulcer of other part of left lower leg with fat layer exposed: Secondary | ICD-10-CM | POA: Diagnosis not present

## 2021-11-19 DIAGNOSIS — I890 Lymphedema, not elsewhere classified: Secondary | ICD-10-CM

## 2021-11-19 DIAGNOSIS — T799XXA Unspecified early complication of trauma, initial encounter: Secondary | ICD-10-CM | POA: Diagnosis not present

## 2021-11-19 DIAGNOSIS — I10 Essential (primary) hypertension: Secondary | ICD-10-CM | POA: Diagnosis not present

## 2021-11-19 DIAGNOSIS — G8929 Other chronic pain: Secondary | ICD-10-CM | POA: Diagnosis not present

## 2021-11-19 NOTE — Progress Notes (Signed)
MEYLI, BOICE ([id number]) Visit Report for 11/19/2021 Chief Complaint Document Details Patient Name: Date of Service: Marylene Land, [place] [name] 11/19/2021 8:15 A M Medical Record Number: [id number] Patient Account Number: [id number] Date of Birth/Sex: Treating RN: 12/28/1945 (76 y.o. [name], [name] in Treatment: 15 Information Obtained from: Patient Chief Complaint 08/06/2021; left lower extremity wound following trauma Electronic Signature(s) Signed: 11/19/2021 10:16:18 AM By: Kalman Shan DO Entered By: Kalman Shan on 11/19/2021 09:25:02 -------------------------------------------------------------------------------- HPI Details Patient Name: Date of Service: [name], [name] 11/19/2021 8:15 A M Medical Record Number: [id number] Patient Account Number: [id number] Date of Birth/Sex: Treating RN: January 26, 1946 (76 y.o. [name] Primary Care Provider: [name] Other Clinician: Referring Provider: Treating Provider/Extender: Loreen Freud in Treatment: 15 History of Present Illness HPI Description: Admission/20 08/2021 Ms. Aslan Montagna is a 76 year old female with a past medical history of breast cancer status post bilateral mastectomy that presents the clinic for a 40-monthhistory of nonhealing ulcer to the left lower extremity. On 05/06/2021 while at work she hit her leg against the corner of a desk experiencing a decent laceration. She went to the ED and had sutures placed. These have since been removed. She has been on 2 rounds of antibiotics including Keflex and Bactrim for this issue. She reports chronic pain to the wound site. She has been using Xeroform with dressing changes. She denies purulent drainage. 5/1; patient presents for follow-up. She started using gentamicin ointment and Hydrofera Blue  over the past week. She reports improvement in her chronic pain. She has compression stockings but it is unclear if she is wearing these daily. She currently denies systemic signs of infection. She had a PCR culture done at last clinic visit that showed high levels of Staph aureus. Keystone antibiotics has been ordered and she states she is receiving this soon. 5/9; patient presents for follow-up. She tolerated the compression wrap well. We have been using gentamicin and Hydrofera Blue under the wrap. She is still not obtained her Keystone antibiotics. She states she will try to get this soon. She denies signs of infection. 5/16; patient presents for follow-up. She states she is obtaining Keystone antibiotics today. She has no issues or complaints today. She tolerated the compression wrap well. We did in office ABIs and they were 0.97 on the left. 5/23; patient presents for follow-up. She has her Keystone antibiotics with her today. She has no issues or complaints. She tolerated the compression wrap well. 5/30; patient presents for follow-up. We have been using Keystone antibiotic with Sorbact under compression wrap. She has no issues or complaints today. She denies signs of infection. 6/6; Keystone with Iodoflex as of last week. Per our intake nurse everything looks a little better she is using 3 layer compression 6/13; patient presents for follow-up. She has been using Keystone with Iodoflex under compression. She was increased to 4 layer at last clinic visit. 6/22; patient presents for follow-up. We have been using Keystone antibiotics with Iodoflex under compression. She has no issues or complaints today. 6/27; patient presents for follow-up. We have been using Keystone antibiotics with PolyMem silver under compression for the past week. She has no issues or complaints today. 7/6; patient presents for follow-up. We have been using Keystone antibiotic with PolyMem silver under the compression wrap  with no issues. Patient reports improvement in wound healing. 7/11;  patient presents for follow-up. We have been using Keystone along with PolyMem silver under compression wrap. Patient has no issues or complaints today. 7/18; patient presents for follow-up. We continue to use [facility] with PolyMem silver under compression therapy. There is continued wound healing. Patient has no issues or complaints today. 8/1; patient presents for follow-up. We have been using Keystone with PolyMem silver under compression therapy. Patient has no issues or complaints today. 8/8; patient presents for follow-up. We have been using PolyMem silver under 4-layer compression. She has no issues or complaints today. Electronic Signature(s) Signed: 11/19/2021 10:16:18 AM By: Kalman Shan DO Entered By: Kalman Shan on 11/19/2021 09:25:32 -------------------------------------------------------------------------------- Physical Exam Details Patient Name: Date of Service: [name], [name] 11/19/2021 8:15 A M Medical Record Number: [id number] Patient Account Number: [id number] Date of Birth/Sex: Treating RN: 06/13/1945 (76 y.o. [name] Primary Care Provider: [name] Other Clinician: Referring Provider: Treating Provider/Extender: Loreen Freud in Treatment: 15 Constitutional respirations regular, non-labored and within target range for patient.. Cardiovascular 2+ dorsalis pedis/posterior tibialis pulses. Psychiatric pleasant and cooperative. Notes T the anterior aspect there is an open wound with granulation tissue. No signs of surrounding infection. Good edema control. o Electronic Signature(s) Signed: 11/19/2021 10:16:18 AM By: Kalman Shan DO Entered By: Kalman Shan on 11/19/2021 09:25:51 -------------------------------------------------------------------------------- Physician Orders Details Patient Name: Date of Service: [name], [name]  11/19/2021 8:15 A M Medical Record Number: [id number] Patient Account Number: [id number] Date of Birth/Sex: Treating RN: 1945/06/26 (76 y.o. [name] Primary Care Provider: [name] Other Clinician: Referring Provider: Treating Provider/Extender: Loreen Freud in Treatment: 15 Verbal / Phone Orders: No Diagnosis Coding ICD-10 Coding Code Description [phone] Non-pressure chronic ulcer of other part of left lower leg with fat layer exposed T79.9XXA Unspecified early complication of trauma, initial encounter I10 Essential (primary) hypertension I89.0 Lymphedema, not elsewhere classified I87.2 Venous insufficiency (chronic) (peripheral) C50.919 Malignant neoplasm of unspecified site of unspecified female breast Follow-up Appointments ppointment in 1 week. - Dr. Heber [address] [facility] [place], Room 8 Tuesday 11/26/2021 0815 Return A Other: - [employer]- will send out wound care supplies Bathing/ Shower/ Hygiene May shower and wash wound with soap and water. Edema Control - Lymphedema / SCD / Other Elevate legs to the level of the heart or above for 30 minutes daily and/or when sitting, a frequency of: - 3-4 times a day throughout the day. Avoid standing for long periods of time. Exercise regularly Moisturize legs daily. - right leg every night before bed. Compression stocking or Garment 20-30 mm/Hg pressure to: - bring in next week. Wound Treatment Wound #1 - Lower Leg Wound Laterality: Left, Anterior Cleanser: Soap and Water 1 x Per Week/30 Days Discharge Instructions: May shower and wash wound with dial antibacterial soap and water prior to dressing change. Peri-Wound Care: Sween Lotion (Moisturizing lotion) 1 x Per Week/30 Days Discharge Instructions: Apply moisturizing lotion as directed Prim Dressing: Xeroform Occlusive Gauze Dressing, 4x4 in 1 x Per Week/30 Days ary Discharge Instructions: Apply to wound bed as instructed Secondary Dressing:  Woven Gauze Sponge, Non-Sterile 4x4 in 1 x Per Week/30 Days Discharge Instructions: Apply over primary dressing as directed. Compression Wrap: FourPress (4 layer compression wrap) 1 x Per Week/30 Days Discharge Instructions: Apply four layer compression as directed. May also use Miliken CoFlex 2 layer compression system as alternative. Electronic Signature(s) Signed: 11/19/2021 10:16:18 AM By: Kalman Shan DO Entered By: Kalman Shan on  11/19/2021 09:25:58 -------------------------------------------------------------------------------- Problem List Details Patient Name: Date of Service: Marylene Land, [place] [name] 11/19/2021 8:15 A M Medical Record Number: [id number] Patient Account Number: [id number] Date of Birth/Sex: Treating RN: June 01, 1945 (76 y.o. [name] Primary Care Provider: [name] Other Clinician: Referring Provider: Treating Provider/Extender: Loreen Freud in Treatment: 15 Active Problems ICD-10 Encounter Code Description Active Date MDM Diagnosis [phone] Non-pressure chronic ulcer of other part of left lower leg with fat layer exposed5/12/2021 No Yes T79.9XXA Unspecified early complication of trauma, initial encounter 08/06/2021 No Yes I10 Essential (primary) hypertension 08/06/2021 No Yes I89.0 Lymphedema, not elsewhere classified 08/06/2021 No Yes I87.2 Venous insufficiency (chronic) (peripheral) 08/06/2021 No Yes C50.919 Malignant neoplasm of unspecified site of unspecified female breast 08/06/2021 No Yes Inactive Problems Resolved Problems Electronic Signature(s) Signed: 11/19/2021 10:16:18 AM By: Kalman Shan DO Entered By: Kalman Shan on 11/19/2021 09:24:50 -------------------------------------------------------------------------------- Progress Note Details Patient Name: Date of Service: [name], [name] 11/19/2021 8:15 A M Medical Record Number: [id number] Patient Account Number: [id number] Date of Birth/Sex:  Treating RN: [birth year] (76 y.o. [name] Primary Care Provider: [name] Other Clinician: Referring Provider: Treating Provider/Extender: Loreen Freud in Treatment: 15 Subjective Chief Complaint Information obtained from Patient 08/06/2021; left lower extremity wound following trauma History of Present Illness (HPI) Admission/20 08/2021 Ms. Adysen Raphael is a 76 year old female with a past medical history of breast cancer status post bilateral mastectomy that presents the clinic for a 14-monthhistory of nonhealing ulcer to the left lower extremity. On 05/06/2021 while at work she hit her leg against the corner of a desk experiencing a decent laceration. She went to the ED and had sutures placed. These have since been removed. She has been on 2 rounds of antibiotics including Keflex and Bactrim for this issue. She reports chronic pain to the wound site. She has been using Xeroform with dressing changes. She denies purulent drainage. 5/1; patient presents for follow-up. She started using gentamicin ointment and Hydrofera Blue over the past week. She reports improvement in her chronic pain. She has compression stockings but it is unclear if she is wearing these daily. She currently denies systemic signs of infection. She had a PCR culture done at last clinic visit that showed high levels of Staph aureus. Keystone antibiotics has been ordered and she states she is receiving this soon. 5/9; patient presents for follow-up. She tolerated the compression wrap well. We have been using gentamicin and Hydrofera Blue under the wrap. She is still not obtained her Keystone antibiotics. She states she will try to get this soon. She denies signs of infection. 5/16; patient presents for follow-up. She states she is obtaining Keystone antibiotics today. She has no issues or complaints today. She tolerated the compression wrap well. We did in office ABIs and they were 0.97  on the left. 5/23; patient presents for follow-up. She has her Keystone antibiotics with her today. She has no issues or complaints. She tolerated the compression wrap well. 5/30; patient presents for follow-up. We have been using Keystone antibiotic with Sorbact under compression wrap. She has no issues or complaints today. She denies signs of infection. 6/6; Keystone with Iodoflex as of last week. Per our intake nurse everything looks a little better she is using 3 layer compression 6/13; patient presents for follow-up. She has been using Keystone with Iodoflex under compression. She was increased to 4 layer at last clinic visit. 6/22; patient presents for  follow-up. We have been using Keystone antibiotics with Iodoflex under compression. She has no issues or complaints today. 6/27; patient presents for follow-up. We have been using Keystone antibiotics with PolyMem silver under compression for the past week. She has no issues or complaints today. 7/6; patient presents for follow-up. We have been using Keystone antibiotic with PolyMem silver under the compression wrap with no issues. Patient reports improvement in wound healing. 7/11; patient presents for follow-up. We have been using Keystone along with PolyMem silver under compression wrap. Patient has no issues or complaints today. 7/18; patient presents for follow-up. We continue to use [facility] with PolyMem silver under compression therapy. There is continued wound healing. Patient has no issues or complaints today. 8/1; patient presents for follow-up. We have been using Keystone with PolyMem silver under compression therapy. Patient has no issues or complaints today. 8/8; patient presents for follow-up. We have been using PolyMem silver under 4-layer compression. She has no issues or complaints today. Patient History Family History Cancer - Father, Diabetes - Father, Heart Disease - Father, Hypertension - Father. Social History Former  smoker - quit 1998, Marital Status - Married, Alcohol Use - Never, Drug Use - No History, Caffeine Use - Never. Medical History Cardiovascular Patient has history of Hypertension Hospitalization/Surgery History - eyes surgery. - tonsillectomy. - hysterectomy. - bilateral mastectomy. - deviated septum from a pear falling from a tree.. Medical A Surgical History Notes nd Constitutional Symptoms (General Health) Bilateral mastectomy Breast Cancer Vitamin D deficiency Cardiovascular Ventricular tachycardia Objective Constitutional respirations regular, non-labored and within target range for patient.. Vitals Time Taken: 8:20 AM, Height: 63 in, Weight: 220 lbs, BMI: 39, Temperature: 98.1 F, Pulse: 88 bpm, Respiratory Rate: 22 breaths/min, Blood Pressure: 168/90 mmHg. Cardiovascular 2+ dorsalis pedis/posterior tibialis pulses. Psychiatric pleasant and cooperative. General Notes: T the anterior aspect there is an open wound with granulation tissue. No signs of surrounding infection. Good edema control. o Integumentary (Hair, Skin) Wound #1 status is Open. Original cause of wound was Trauma. The date acquired was: 05/06/2021. The wound has been in treatment 15 weeks. The wound is located on the Left,Anterior Lower Leg. The wound measures 0.1cm length x 0.1cm width x 0.1cm depth; 0.008cm^2 area and 0.001cm^3 volume. There is Fat Layer (Subcutaneous Tissue) exposed. There is no tunneling or undermining noted. There is a small amount of drainage noted. The wound margin is distinct with the outline attached to the wound base. There is large (67-100%) red granulation within the wound bed. There is no necrotic tissue within the wound bed. Assessment Active Problems ICD-10 Non-pressure chronic ulcer of other part of left lower leg with fat layer exposed Unspecified early complication of trauma, initial encounter Essential (primary) hypertension Lymphedema, not elsewhere classified Venous  insufficiency (chronic) (peripheral) Malignant neoplasm of unspecified site of unspecified female breast Patient's wound has shown improvement in size and appearance since last clinic visit. The PolyMem silver was stuck to the wound bed when taking the dressing off today. She still has a small open wound remaining. I recommended Xeroform to this area and continued compression therapy. Follow-up in 1 week. I am hopeful this will be healed by then. Procedures Wound #1 Pre-procedure diagnosis of Wound #1 is an Abrasion located on the Left,Anterior Lower Leg . There was a Double Layer Compression Therapy Procedure by Deon Pilling, RN. Post procedure Diagnosis Wound #1: Same as Pre-Procedure Plan Follow-up Appointments: Return Appointment in 1 week. - Dr. Heber [place] [facility] [place], Room 8 Tuesday 11/26/2021 0962 Other: -  [employer]- will send out wound care supplies Bathing/ Shower/ Hygiene: May shower and wash wound with soap and water. Edema Control - Lymphedema / SCD / Other: Elevate legs to the level of the heart or above for 30 minutes daily and/or when sitting, a frequency of: - 3-4 times a day throughout the day. Avoid standing for long periods of time. Exercise regularly Moisturize legs daily. - right leg every night before bed. Compression stocking or Garment 20-30 mm/Hg pressure to: - bring in next week. WOUND #1: - Lower Leg Wound Laterality: Left, Anterior Cleanser: Soap and Water 1 x Per Week/30 Days Discharge Instructions: May shower and wash wound with dial antibacterial soap and water prior to dressing change. Peri-Wound Care: Sween Lotion (Moisturizing lotion) 1 x Per Week/30 Days Discharge Instructions: Apply moisturizing lotion as directed Prim Dressing: Xeroform Occlusive Gauze Dressing, 4x4 in 1 x Per Week/30 Days ary Discharge Instructions: Apply to wound bed as instructed Secondary Dressing: Woven Gauze Sponge, Non-Sterile 4x4 in 1 x Per Week/30 Days Discharge  Instructions: Apply over primary dressing as directed. Com pression Wrap: FourPress (4 layer compression wrap) 1 x Per Week/30 Days Discharge Instructions: Apply four layer compression as directed. May also use Miliken CoFlex 2 layer compression system as alternative. 1. Xeroform under 4-layer compression 2. Follow-up in 1 week Electronic Signature(s) Signed: 11/19/2021 10:16:18 AM By: Kalman Shan DO Entered By: Kalman Shan on 11/19/2021 09:26:47 -------------------------------------------------------------------------------- HxROS Details Patient Name: Date of Service: [name], [name] 11/19/2021 8:15 A M Medical Record Number: [id number] Patient Account Number: [id number] Date of Birth/Sex: Treating RN: July 07, 1945 (76 y.o. [name], [name] in Treatment: 15 Constitutional Symptoms (General Health) Medical History: Past Medical History Notes: Bilateral mastectomy Breast Cancer Vitamin D deficiency Cardiovascular Medical History: Positive for: Hypertension Past Medical History Notes: Ventricular tachycardia Immunizations Pneumococcal Vaccine: Received Pneumococcal Vaccination: No Implantable Devices No devices added Hospitalization / Surgery History Type of Hospitalization/Surgery eyes surgery tonsillectomy hysterectomy bilateral mastectomy deviated septum from a pear falling from a tree. Family and Social History Cancer: Yes - Father; Diabetes: Yes - Father; Heart Disease: Yes - Father; Hypertension: Yes - Father; Former smoker - quit 1998; Marital Status - Married; Alcohol Use: Never; Drug Use: No History; Caffeine Use: Never; Financial Concerns: No; Food, Clothing or Shelter Needs: No; Support System Lacking: No; Transportation Concerns: No Electronic Signature(s) Signed: 11/19/2021 10:16:18 AM By: Kalman Shan  DO Signed: 11/19/2021 5:29:00 PM By: Deon Pilling RN, BSN Entered By: Kalman Shan on 11/19/2021 09:25:36 -------------------------------------------------------------------------------- SuperBill Details Patient Name: Date of Service: [name], [name] 11/19/2021 Medical Record Number: [id number] Patient Account Number: [id number] Date of Birth/Sex: Treating RN: 01-22-1946 (76 y.o. [name] Primary Care Provider: [name] Other Clinician: Referring Provider: Treating Provider/Extender: Loreen Freud in Treatment: 15 Diagnosis Coding ICD-10 Codes Code Description [phone] Non-pressure chronic ulcer of other part of left lower leg with fat layer exposed T79.9XXA Unspecified early complication of trauma, initial encounter I10 Essential (primary) hypertension I89.0 Lymphedema, not elsewhere classified I87.2 Venous insufficiency (chronic) (peripheral) C50.919 Malignant neoplasm of unspecified site of unspecified female breast Facility Procedures CPT4 Code: [id number] ( Description: Facility Use Only) [phone] - [facility] Modifier: Quantity: 1 Physician Procedures Electronic Signature(s) Signed: 11/19/2021 10:16:18 AM By: Kalman Shan DO Entered By: Kalman Shan on 11/19/2021 09:27:00

## 2021-11-19 NOTE — Progress Notes (Signed)
NEL, STONEKING ([id number]) Visit Report for 11/19/2021 Arrival Information Details Patient Name: Date of Service: [place], Hermleigh [name] 11/19/2021 8:15 A M Medical Record Number: [id number] Patient Account Number: [id number] Date of Birth/Sex: Treating RN: Dec 25, 1945 (76 y.o. [name], [name] Primary Care Bishoy Cupp: [name] Other Clinician: Referring Tyaisha Cullom: Treating Daulton Harbaugh/Extender: Loreen Freud in Treatment: 15 Visit Information History Since Last Visit Added or deleted any medications: No Patient Arrived: [name] Any new allergies or adverse reactions: No Arrival Time: 08:19 Had a fall or experienced change in No Accompanied By: self activities of daily living that may affect Transfer Assistance: None risk of falls: Patient Identification Verified: Yes Signs or symptoms of abuse/neglect since last visito No Secondary Verification Process Completed: Yes Hospitalized since last visit: No Patient Requires Transmission-Based Precautions: No Implantable device outside of the clinic excluding No Patient Has Alerts: No cellular tissue based products placed in the center since last visit: Has Dressing in Place as Prescribed: Yes Has Compression in Place as Prescribed: Yes Pain Present Now: No Electronic Signature(s) Signed: 11/19/2021 5:29:00 PM By: Deon Pilling RN, BSN Entered By: Deon Pilling on 11/19/2021 08:19:14 -------------------------------------------------------------------------------- Compression Therapy Details Patient Name: Date of Service: [name], [name] 11/19/2021 8:15 A M Medical Record Number: [id number] Patient Account Number: [id number] Date of Birth/Sex: Treating RN: [birth year] (76 y.o. [name], [name] Primary Care Tydus Sanmiguel: [name] Other Clinician: Referring Wayden Schwertner: Treating Denario Bagot/Extender: Loreen Freud in Treatment: 15 Compression Therapy Performed for Wound Assessment: Wound #1  Left,Anterior Lower Leg Performed By: Clinician Deon Pilling, RN Compression Type: Double Layer Post Procedure Diagnosis Same as Pre-procedure Electronic Signature(s) Signed: 11/19/2021 5:29:00 PM By: Deon Pilling RN, BSN Entered By: Deon Pilling on 11/19/2021 08:36:04 -------------------------------------------------------------------------------- Encounter Discharge Information Details Patient Name: Date of Service: [name], [name] 11/19/2021 8:15 A M Medical Record Number: [id number] Patient Account Number: [id number] Date of Birth/Sex: Treating RN: [birth year] (76 y.o. [name], [name] Primary Care Deara Bober: [name] Other Clinician: Referring Dilcia Rybarczyk: Treating Edmond Ginsberg/Extender: Loreen Freud in Treatment: 15 Encounter Discharge Information Items Discharge Condition: Stable Ambulatory Status: Cane Discharge Destination: Home Transportation: Private Auto Accompanied By: self Schedule Follow-up Appointment: Yes Clinical Summary of Care: Electronic Signature(s) Signed: 11/19/2021 5:29:00 PM By: Deon Pilling RN, BSN Entered By: Deon Pilling on 11/19/2021 08:37:48 -------------------------------------------------------------------------------- Lower Extremity Assessment Details Patient Name: Date of Service: [name], [name] 11/19/2021 8:15 A M Medical Record Number: [id number] Patient Account Number: [id number] Date of Birth/Sex: Treating RN: [birth year] (76 y.o. [name], [name] Primary Care Nollie Shiflett: [name] Other Clinician: Referring Jailah Willis: Treating Lilliauna Van/Extender: Loreen Freud in Treatment: 15 Edema Assessment Assessed: [name]: Yes] [name]: No] Edema: [Left: Ye] [Right: s] Calf Left: Right: Point of Measurement: 30 cm From Medial Instep 41 cm Ankle Left: Right: Point of Measurement: 9 cm From Medial Instep 25 cm Vascular Assessment Pulses: Dorsalis Pedis Palpable:  [Left:Yes] Electronic Signature(s) Signed: 11/19/2021 5:29:00 PM By: Deon Pilling RN, BSN Entered By: Deon Pilling on 11/19/2021 08:21:33 -------------------------------------------------------------------------------- Multi Wound Chart Details Patient Name: Date of Service: [name], [name] 11/19/2021 8:15 A M Medical Record Number: [id number] Patient Account Number: [id number] Date of Birth/Sex: Treating RN: 1945/08/27 (76 y.o. [name] Primary Care Williom Cedar: [name] Other Clinician: Referring Dionta Larke: Treating Mahmoud Blazejewski/Extender: Loreen Freud in Treatment: 15 Vital Signs Height(in): 63 Pulse(bpm): 88 Weight(lbs): 220 Blood  Pressure(mmHg): 168/90 Body Mass Index(BMI): 39 Temperature(F): 98.1 Respiratory Rate(breaths/min): 22 Photos: [N/A:N/A] Left, Anterior Lower Leg N/A N/A Wound Location: Trauma N/A N/A Wounding Event: Abrasion N/A N/A Primary Etiology: Lymphedema N/A N/A Secondary Etiology: Hypertension N/A N/A Comorbid History: 05/06/2021 N/A N/A Date Acquired: 15 N/A N/A Weeks of Treatment: Open N/A N/A Wound Status: No N/A N/A Wound Recurrence: 0.1x0.1x0.1 N/A N/A Measurements L x W x D (cm) 0.008 N/A N/A A (cm) : rea 0.001 N/A N/A Volume (cm) : 99.80% N/A N/A % Reduction in A rea: 99.70% N/A N/A % Reduction in Volume: Full Thickness Without Exposed N/A N/A Classification: Support Structures Small N/A N/A Exudate Amount: Distinct, outline attached N/A N/A Wound Margin: Large (67-100%) N/A N/A Granulation Amount: Red N/A N/A Granulation Quality: None Present (0%) N/A N/A Necrotic Amount: Fat Layer (Subcutaneous Tissue): Yes N/A N/A Exposed Structures: Fascia: No Tendon: No Muscle: No Joint: No Bone: No Large (67-100%) N/A N/A Epithelialization: Compression Therapy N/A N/A Procedures Performed: Treatment Notes Wound #1 (Lower Leg) Wound Laterality: Left, Anterior Cleanser Soap and  Water Discharge Instruction: May shower and wash wound with dial antibacterial soap and water prior to dressing change. Peri-Wound Care Sween Lotion (Moisturizing lotion) Discharge Instruction: Apply moisturizing lotion as directed Topical Primary Dressing Xeroform Occlusive Gauze Dressing, 4x4 in Discharge Instruction: Apply to wound bed as instructed Secondary Dressing Woven Gauze Sponge, Non-Sterile 4x4 in Discharge Instruction: Apply over primary dressing as directed. Secured With Compression Wrap FourPress (4 layer compression wrap) Discharge Instruction: Apply four layer compression as directed. May also use Miliken CoFlex 2 layer compression system as alternative. Compression Stockings Add-Ons Electronic Signature(s) Signed: 11/19/2021 10:16:18 AM By: Kalman Shan DO Signed: 11/19/2021 5:29:00 PM By: Deon Pilling RN, BSN Entered By: Kalman Shan on 11/19/2021 09:24:57 -------------------------------------------------------------------------------- Multi-Disciplinary Care Plan Details Patient Name: Date of Service: [name], [name] 11/19/2021 8:15 A M Medical Record Number: [id number] Patient Account Number: [id number] Date of Birth/Sex: Treating RN: 31-Mar-1946 (76 y.o. [name] Primary Care Krithi Bray: [name] Other Clinician: Referring Nhung Danko: Treating Noemy Hallmon/Extender: Loreen Freud in Treatment: 15 Active Inactive Electronic Signature(s) Signed: 11/19/2021 5:29:00 PM By: Deon Pilling RN, BSN Entered By: Deon Pilling on 11/19/2021 08:26:37 -------------------------------------------------------------------------------- Pain Assessment Details Patient Name: Date of Service: [name], [name] 11/19/2021 8:15 A M Medical Record Number: [id number] Patient Account Number: [id number] Date of Birth/Sex: Treating RN: 1945-07-19 (76 y.o. [name] Primary Care Brock Mokry: [name] Other Clinician: Referring  Lateefa Crosby: Treating Priti Consoli/Extender: Loreen Freud in Treatment: 15 Active Problems Location of Pain Severity and Description of Pain Patient Has Paino No Site Locations Rate the pain. Rate the pain. Current Pain Level: 0 Pain Management and Medication Current Pain Management: Medication: No Cold Application: No Rest: No Massage: No Activity: No T.E.N.S.: No Heat Application: No Leg drop or elevation: No Is the Current Pain Management Adequate: Adequate How does your wound impact your activities of daily livingo Sleep: No Bathing: No Appetite: No Relationship With Others: No Bladder Continence: No Emotions: No Bowel Continence: No Work: No Toileting: No Drive: No Dressing: No Hobbies: No Engineer, maintenance) Signed: 11/19/2021 5:29:00 PM By: Deon Pilling RN, BSN Entered By: Deon Pilling on 11/19/2021 08:19:23 -------------------------------------------------------------------------------- Patient/Caregiver Education Details Patient Name: Date of Service: [name], [name] 8/8/2023andnbsp8:15 A M Medical Record Number: [id number] Patient Account Number: [id number] Date of Birth/Gender: Treating RN: 1946-03-06 (76 y.o. [name] Primary Care Physician: Orpah Melter  C Other Clinician: Referring Physician: Treating Physician/Extender: Loreen Freud in Treatment: 15 Education Assessment Education Provided To: Patient Education Topics Provided Wound/Skin Impairment: Handouts: Skin Care Do's and Dont's Methods: Explain/Verbal Responses: Reinforcements needed Electronic Signature(s) Signed: 11/19/2021 5:29:00 PM By: Deon Pilling RN, BSN Entered By: Deon Pilling on 11/19/2021 08:26:21 -------------------------------------------------------------------------------- Wound Assessment Details Patient Name: Date of Service: [name], [name] 11/19/2021 8:15 A M Medical Record Number:  [id number] Patient Account Number: [id number] Date of Birth/Sex: Treating RN: 1945-10-22 (76 y.o. [name], [name] Primary Care Itzael Liptak: [name] Other Clinician: Referring Chandria Rookstool: Treating Jaise Moser/Extender: Loreen Freud in Treatment: 15 Wound Status Wound Number: 1 Primary Etiology: Abrasion Wound Location: Left, Anterior Lower Leg Secondary Etiology: Lymphedema Wounding Event: Trauma Wound Status: Open Date Acquired: 05/06/2021 Comorbid History: Hypertension Weeks Of Treatment: 15 Clustered Wound: No Photos Wound Measurements Length: (cm) 0.1 Width: (cm) 0.1 Depth: (cm) 0.1 Area: (cm) 0.008 Volume: (cm) 0.001 % Reduction in Area: 99.8% % Reduction in Volume: 99.7% Epithelialization: Large (67-100%) Tunneling: No Undermining: No Wound Description Classification: Full Thickness Without Exposed Support Structures Wound Margin: Distinct, outline attached Exudate Amount: Small Foul Odor After Cleansing: No Slough/Fibrino No Wound Bed Granulation Amount: Large (67-100%) Exposed Structure Granulation Quality: Red Fascia Exposed: No Necrotic Amount: None Present (0%) Fat Layer (Subcutaneous Tissue) Exposed: Yes Tendon Exposed: No Muscle Exposed: No Joint Exposed: No Bone Exposed: No Treatment Notes Wound #1 (Lower Leg) Wound Laterality: Left, Anterior Cleanser Soap and Water Discharge Instruction: May shower and wash wound with dial antibacterial soap and water prior to dressing change. Peri-Wound Care Sween Lotion (Moisturizing lotion) Discharge Instruction: Apply moisturizing lotion as directed Topical Primary Dressing Xeroform Occlusive Gauze Dressing, 4x4 in Discharge Instruction: Apply to wound bed as instructed Secondary Dressing Woven Gauze Sponge, Non-Sterile 4x4 in Discharge Instruction: Apply over primary dressing as directed. Secured With Compression Wrap FourPress (4 layer compression wrap) Discharge  Instruction: Apply four layer compression as directed. May also use Miliken CoFlex 2 layer compression system as alternative. Compression Stockings Add-Ons Electronic Signature(s) Signed: 11/19/2021 5:29:00 PM By: Deon Pilling RN, BSN Entered By: Deon Pilling on 11/19/2021 08:35:51 -------------------------------------------------------------------------------- Vitals Details Patient Name: Date of Service: [name], [name] 11/19/2021 8:15 A M Medical Record Number: [id number] Patient Account Number: [id number] Date of Birth/Sex: Treating RN: 18-Nov-1945 (76 y.o. [name], [name] Primary Care Faatima Tench: [name] Other Clinician: Referring Julene Rahn: Treating Dilana Mcphie/Extender: Loreen Freud in Treatment: 15 Vital Signs Time Taken: 08:20 Temperature (F): 98.1 Height (in): 63 Pulse (bpm): 88 Weight (lbs): 220 Respiratory Rate (breaths/min): 22 Body Mass Index (BMI): 39 Blood Pressure (mmHg): 168/90 Reference Range: 80 - 120 mg / dl Electronic Signature(s) Signed: 11/19/2021 5:29:00 PM By: Deon Pilling RN, BSN Entered By: Deon Pilling on 11/19/2021 08:22:33

## 2021-11-26 ENCOUNTER — Encounter (HOSPITAL_BASED_OUTPATIENT_CLINIC_OR_DEPARTMENT_OTHER): Payer: No Typology Code available for payment source | Admitting: Internal Medicine

## 2021-11-26 DIAGNOSIS — G8929 Other chronic pain: Secondary | ICD-10-CM | POA: Diagnosis not present

## 2021-11-26 DIAGNOSIS — I872 Venous insufficiency (chronic) (peripheral): Secondary | ICD-10-CM

## 2021-11-26 DIAGNOSIS — I890 Lymphedema, not elsewhere classified: Secondary | ICD-10-CM | POA: Diagnosis not present

## 2021-11-26 DIAGNOSIS — I10 Essential (primary) hypertension: Secondary | ICD-10-CM | POA: Diagnosis not present

## 2021-11-26 DIAGNOSIS — T799XXA Unspecified early complication of trauma, initial encounter: Secondary | ICD-10-CM

## 2021-11-26 DIAGNOSIS — L97822 Non-pressure chronic ulcer of other part of left lower leg with fat layer exposed: Secondary | ICD-10-CM | POA: Diagnosis not present

## 2021-11-27 NOTE — Progress Notes (Signed)
[name], SHEFFER ([id number]) Visit Report for 11/26/2021 Arrival Information Details Patient Name: Date of Service: [facility], [place] [name] 11/26/2021 8:15 A M Medical Record Number: [id number] Patient Account Number: [id number] Date of Birth/Sex: Treating RN: 1945-09-13 (76 y.o. [name], [name] Primary Care Schuyler Olden: [name] Other Clinician: Referring Mella Inclan: Treating Addisyn Leclaire/Extender: Loreen Freud in Treatment: 16 Visit Information History Since Last Visit Added or deleted any medications: No Patient Arrived: [name] Any new allergies or adverse reactions: No Arrival Time: 08:08 Had a fall or experienced change in No Accompanied By: self activities of daily living that may affect Transfer Assistance: None risk of falls: Patient Identification Verified: Yes Signs or symptoms of abuse/neglect since last visito No Secondary Verification Process Completed: Yes Hospitalized since last visit: No Patient Requires Transmission-Based Precautions: No Implantable device outside of the clinic excluding No Patient Has Alerts: No cellular tissue based products placed in the center since last visit: Has Dressing in Place as Prescribed: Yes Has Compression in Place as Prescribed: Yes Pain Present Now: No Electronic Signature(s) Signed: 11/26/2021 4:22:40 PM By: Deon Pilling RN, BSN Entered By: Deon Pilling on 11/26/2021 08:08:50 -------------------------------------------------------------------------------- Clinic Level of Care Assessment Details Patient Name: Date of Service: [name], Jessica [name] 11/26/2021 8:15 A M Medical Record Number: [id number] Patient Account Number: [id number] Date of Birth/Sex: Treating RN: 12/12/1945 (76 y.o. [name], [name] Primary Care Jacobie Stamey: [name] Other Clinician: Referring Aasim Restivo: Treating Salina Stanfield/Extender: Loreen Freud in Treatment: 16 Clinic Level of Care Assessment Items TOOL  4 Quantity Score X- 1 0 Use when only an EandM is performed on FOLLOW-UP visit ASSESSMENTS - Nursing Assessment / Reassessment X- 1 10 Reassessment of Jessica-morbidities (includes updates in patient status) X- 1 5 Reassessment of Adherence to Treatment Plan ASSESSMENTS - Wound and Skin A ssessment / Reassessment X - Simple Wound Assessment / Reassessment - one wound 1 5 '[]'$  - 0 Complex Wound Assessment / Reassessment - multiple wounds X- 1 10 Dermatologic / Skin Assessment (not related to wound area) ASSESSMENTS - Focused Assessment X- 1 5 Circumferential Edema Measurements - multi extremities X- 1 10 Nutritional Assessment / Counseling / Intervention '[]'$  - 0 Lower Extremity Assessment (monofilament, tuning fork, pulses) '[]'$  - 0 Peripheral Arterial Disease Assessment (using hand held doppler) ASSESSMENTS - Ostomy and/or Continence Assessment and Care '[]'$  - 0 Incontinence Assessment and Management '[]'$  - 0 Ostomy Care Assessment and Management (repouching, etc.) PROCESS - Coordination of Care X - Simple Patient / Family Education for ongoing care 1 15 '[]'$  - 0 Complex (extensive) Patient / Family Education for ongoing care X- 1 10 Staff obtains Programmer, systems, Records, T Results / Process Orders est '[]'$  - 0 Staff telephones HHA, Nursing Homes / Clarify orders / etc '[]'$  - 0 Routine Transfer to another Facility (non-emergent condition) '[]'$  - 0 Routine Hospital Admission (non-emergent condition) '[]'$  - 0 New Admissions / Biomedical engineer / Ordering NPWT Apligraf, etc. , '[]'$  - 0 Emergency Hospital Admission (emergent condition) X- 1 10 Simple Discharge Coordination '[]'$  - 0 Complex (extensive) Discharge Coordination PROCESS - Special Needs '[]'$  - 0 Pediatric / Minor Patient Management '[]'$  - 0 Isolation Patient Management '[]'$  - 0 Hearing / Language / Visual special needs '[]'$  - 0 Assessment of Community assistance (transportation, D/C planning, etc.) '[]'$  - 0 Additional assistance /  Altered mentation '[]'$  - 0 Support Surface(s) Assessment (bed, cushion, seat, etc.) INTERVENTIONS - Wound Cleansing / Measurement X - Simple Wound  Cleansing - one wound 1 5 '[]'$  - 0 Complex Wound Cleansing - multiple wounds X- 1 5 Wound Imaging (photographs - any number of wounds) '[]'$  - 0 Wound Tracing (instead of photographs) X- 1 5 Simple Wound Measurement - one wound '[]'$  - 0 Complex Wound Measurement - multiple wounds INTERVENTIONS - Wound Dressings '[]'$  - 0 Small Wound Dressing one or multiple wounds '[]'$  - 0 Medium Wound Dressing one or multiple wounds '[]'$  - 0 Large Wound Dressing one or multiple wounds '[]'$  - 0 Application of Medications - topical '[]'$  - 0 Application of Medications - injection INTERVENTIONS - Miscellaneous '[]'$  - 0 External ear exam '[]'$  - 0 Specimen Collection (cultures, biopsies, blood, body fluids, etc.) '[]'$  - 0 Specimen(s) / Culture(s) sent or taken to Lab for analysis '[]'$  - 0 Patient Transfer (multiple staff / Civil Service fast streamer / Similar devices) '[]'$  - 0 Simple Staple / Suture removal (25 or less) '[]'$  - 0 Complex Staple / Suture removal (26 or more) '[]'$  - 0 Hypo / Hyperglycemic Management (close monitor of Blood Glucose) '[]'$  - 0 Ankle / Brachial Index (ABI) - do not check if billed separately X- 1 5 Vital Signs Has the patient been seen at the hospital within the last three years: Yes Total Score: 100 Level Of Care: New/Established - Level 3 Electronic Signature(s) Signed: 11/26/2021 4:22:40 PM By: Deon Pilling RN, BSN Entered By: Deon Pilling on 11/26/2021 08:29:59 -------------------------------------------------------------------------------- Encounter Discharge Information Details Patient Name: Date of Service: [name], Jessica [name] 11/26/2021 8:15 A M Medical Record Number: [id number] Patient Account Number: [id number] Date of Birth/Sex: Treating RN: 08-12-1945 (76 y.o. [name] Primary Care Krishiv Sandler: [name] Other Clinician: Referring  Faisal Stradling: Treating Kalon Erhardt/Extender: Loreen Freud in Treatment: 16 Encounter Discharge Information Items Discharge Condition: Stable Ambulatory Status: Cane Discharge Destination: Home Transportation: Private Auto Accompanied By: self Schedule Follow-up Appointment: No Clinical Summary of Care: Notes compression stockings. Electronic Signature(s) Signed: 11/26/2021 4:22:40 PM By: Deon Pilling RN, BSN Entered By: Deon Pilling on 11/26/2021 08:30:27 -------------------------------------------------------------------------------- Lower Extremity Assessment Details Patient Name: Date of Service: [name], Jessica [name] 11/26/2021 8:15 A M Medical Record Number: [id number] Patient Account Number: [id number] Date of Birth/Sex: Treating RN: 1945-07-24 (76 y.o. [name], [name] Primary Care Latausha Flamm: [name] Other Clinician: Referring Hesper Venturella: Treating Laird Runnion/Extender: Loreen Freud in Treatment: 16 Edema Assessment Assessed: [name]: Yes] [name]: No] Edema: [Left: N] [Right: o] Calf Left: Right: Point of Measurement: 30 cm From Medial Instep 41 cm Ankle Left: Right: Point of Measurement: 9 cm From Medial Instep 25 cm Vascular Assessment Pulses: Dorsalis Pedis Palpable: [Left:Yes] Electronic Signature(s) Signed: 11/26/2021 4:22:40 PM By: Deon Pilling RN, BSN Entered By: Deon Pilling on 11/26/2021 08:10:32 -------------------------------------------------------------------------------- Multi Wound Chart Details Patient Name: Date of Service: Jessica [name], Jessica [name] 11/26/2021 8:15 A M Medical Record Number: [id number] Patient Account Number: [id number] Date of Birth/Sex: Treating RN: Jul 10, 1945 (76 y.o. F) Primary Care Laporchia Nakajima: [name] Other Clinician: Referring Merl Bommarito: Treating Filmore Molyneux/Extender: Loreen Freud in Treatment: 16 Vital Signs Height(in): 63 Pulse(bpm):  87 Weight(lbs): 220 Blood Pressure(mmHg): 149/87 Body Mass Index(BMI): 39 Temperature(F): 98.2 Respiratory Rate(breaths/min): 20 Photos: [N/A:N/A] Left, Anterior Lower Leg N/A N/A Wound Location: Trauma N/A N/A Wounding Event: Abrasion N/A N/A Primary Etiology: Lymphedema N/A N/A Secondary Etiology: Hypertension N/A N/A Comorbid History: 05/06/2021 N/A N/A Date Acquired: 16 N/A N/A Weeks of Treatment: Open N/A N/A Wound Status: No N/A  N/A Wound Recurrence: 0x0x0 N/A N/A Measurements L x W x D (cm) 0 N/A N/A A (cm) : rea 0 N/A N/A Volume (cm) : 100.00% N/A N/A % Reduction in A rea: 100.00% N/A N/A % Reduction in Volume: Full Thickness Without Exposed N/A N/A Classification: Support Structures None Present N/A N/A Exudate Amount: Distinct, outline attached N/A N/A Wound Margin: None Present (0%) N/A N/A Granulation Amount: None Present (0%) N/A N/A Necrotic Amount: Fascia: No N/A N/A Exposed Structures: Fat Layer (Subcutaneous Tissue): No Tendon: No Muscle: No Joint: No Bone: No Large (67-100%) N/A N/A Epithelialization: Treatment Notes Electronic Signature(s) Signed: 11/26/2021 9:21:42 AM By: Kalman Shan DO Entered By: Kalman Shan on 11/26/2021 08:42:36 -------------------------------------------------------------------------------- Multi-Disciplinary Care Plan Details Patient Name: Date of Service: Jessica [name], Jessica [name] 11/26/2021 8:15 A M Medical Record Number: [id number] Patient Account Number: [id number] Date of Birth/Sex: Treating RN: [birth year] (76 y.o. [name] Primary Care Verania Salberg: [name] Other Clinician: Referring Leilani Cespedes: Treating Elyn Krogh/Extender: Loreen Freud in Treatment: 16 Active Inactive Electronic Signature(s) Signed: 11/26/2021 4:22:40 PM By: Deon Pilling RN, BSN Entered By: Deon Pilling on 11/26/2021  08:29:29 -------------------------------------------------------------------------------- Pain Assessment Details Patient Name: Date of Service: [name], Jessica [name] 11/26/2021 8:15 A M Medical Record Number: [id number] Patient Account Number: [id number] Date of Birth/Sex: Treating RN: [birth year] (76 y.o. [name] Primary Care Starlee Corralejo: [name] Other Clinician: Referring Maverick Patman: Treating Marshae Azam/Extender: Loreen Freud in Treatment: 16 Active Problems Location of Pain Severity and Description of Pain Patient Has Paino No Site Locations Rate the pain. Current Pain Level: 0 Pain Management and Medication Current Pain Management: Medication: No Cold Application: No Rest: No Massage: No Activity: No T.E.N.S.: No Heat Application: No Leg drop or elevation: No Is the Current Pain Management Adequate: Adequate How does your wound impact your activities of daily livingo Sleep: No Bathing: No Appetite: No Relationship With Others: No Bladder Continence: No Emotions: No Bowel Continence: No Work: No Toileting: No Drive: No Dressing: No Hobbies: No Electronic Signature(s) Signed: 11/26/2021 4:22:40 PM By: Deon Pilling RN, BSN Entered By: Deon Pilling on 11/26/2021 08:09:03 -------------------------------------------------------------------------------- Wound Assessment Details Patient Name: Date of Service: [name], Jessica [name] 11/26/2021 8:15 A M Medical Record Number: [id number] Patient Account Number: [id number] Date of Birth/Sex: Treating RN: 10-01-1945 (76 y.o. [name], [name] Primary Care Akshith Moncus: [name] Other Clinician: Referring Ryatt Corsino: Treating Alphonsus Doyel/Extender: Loreen Freud in Treatment: 16 Wound Status Wound Number: 1 Primary Etiology: Abrasion Wound Location: Left, Anterior Lower Leg Secondary Etiology: Lymphedema Wounding Event: Trauma Wound Status: Open Date  Acquired: 05/06/2021 Comorbid History: Hypertension Weeks Of Treatment: 16 Clustered Wound: No Photos Wound Measurements Length: (cm) Width: (cm) Depth: (cm) Area: (cm) Volume: (cm) 0 % Reduction in Area: 100% 0 % Reduction in Volume: 100% 0 Epithelialization: Large (67-100%) 0 Tunneling: No 0 Undermining: No Wound Description Classification: Full Thickness Without Exposed Support Structures Wound Margin: Distinct, outline attached Exudate Amount: None Present Foul Odor After Cleansing: No Slough/Fibrino No Wound Bed Granulation Amount: None Present (0%) Exposed Structure Necrotic Amount: None Present (0%) Fascia Exposed: No Fat Layer (Subcutaneous Tissue) Exposed: No Tendon Exposed: No Muscle Exposed: No Joint Exposed: No Bone Exposed: No Electronic Signature(s) Signed: 11/26/2021 4:22:40 PM By: Deon Pilling RN, BSN Entered By: Deon Pilling on 11/26/2021 08:12:57 -------------------------------------------------------------------------------- Vitals Details Patient Name: Date of Service: [name], Jessica [name] 11/26/2021 8:15 A M Medical Record Number:  [id number] Patient Account Number: [id number] Date of Birth/Sex: Treating RN: Jun 25, 1945 (76 y.o. [name], [name] Primary Care Abrea Henle: [name] Other Clinician: Referring Rocky Gladden: Treating Demontrez Rindfleisch/Extender: Loreen Freud in Treatment: 16 Vital Signs Time Taken: 08:11 Temperature (F): 98.2 Height (in): 63 Pulse (bpm): 87 Weight (lbs): 220 Respiratory Rate (breaths/min): 20 Body Mass Index (BMI): 39 Blood Pressure (mmHg): 149/87 Reference Range: 80 - 120 mg / dl Electronic Signature(s) Signed: 11/26/2021 4:22:40 PM By: Deon Pilling RN, BSN Entered By: Deon Pilling on 11/26/2021 08:11:40

## 2021-11-29 NOTE — Progress Notes (Signed)
[name], [name] ([id number]) Visit Report for 11/26/2021 Chief Complaint Document Details Patient Name: Date of Service: [name], [place] [name] 11/26/2021 8:15 A M Medical Record Number: [id number] Patient Account Number: [id number] Date of Birth/Sex: Treating RN: [birth year] (76 y.o. F) Primary Care Provider: [name] Other Clinician: Referring Provider: Treating Provider/Extender: Loreen Freud in Treatment: 16 Information Obtained from: Patient Chief Complaint 08/06/2021; left lower extremity wound following trauma Electronic Signature(s) Signed: 11/26/2021 9:21:42 AM By: Kalman Shan DO Entered By: Kalman Shan on 11/26/2021 08:42:45 -------------------------------------------------------------------------------- HPI Details Patient Name: Date of Service: [name], Jessica [name] 11/26/2021 8:15 A M Medical Record Number: [id number] Patient Account Number: [id number] Date of Birth/Sex: Treating RN: 1945-12-30 (76 y.o. F) Primary Care Provider: [name] Other Clinician: Referring Provider: Treating Provider/Extender: Loreen Freud in Treatment: 16 History of Present Illness HPI Description: Admission/20 08/2021 [name] is a 76 year old female with a past medical history of breast cancer status post bilateral mastectomy that presents the clinic for a 98-monthhistory of nonhealing ulcer to the left lower extremity. On 05/06/2021 while at work she hit her leg against the corner of a desk experiencing a decent laceration. She went to the ED and had sutures placed. These have since been removed. She has been on 2 rounds of antibiotics including Keflex and Bactrim for this issue. She reports chronic pain to the wound site. She has been using Xeroform with dressing changes. She denies purulent drainage. 5/1; patient presents for follow-up. She started using gentamicin ointment and Hydrofera Blue over the past week. She  reports improvement in her chronic pain. She has compression stockings but it is unclear if she is wearing these daily. She currently denies systemic signs of infection. She had a PCR culture done at last clinic visit that showed high levels of Staph aureus. Keystone antibiotics has been ordered and she states she is receiving this soon. 5/9; patient presents for follow-up. She tolerated the compression wrap well. We have been using gentamicin and Hydrofera Blue under the wrap. She is still not obtained her Keystone antibiotics. She states she will try to get this soon. She denies signs of infection. 5/16; patient presents for follow-up. She states she is obtaining Keystone antibiotics today. She has no issues or complaints today. She tolerated the compression wrap well. We did in office ABIs and they were 0.97 on the left. 5/23; patient presents for follow-up. She has her Keystone antibiotics with her today. She has no issues or complaints. She tolerated the compression wrap well. 5/30; patient presents for follow-up. We have been using Keystone antibiotic with Sorbact under compression wrap. She has no issues or complaints today. She denies signs of infection. 6/6; Keystone with Iodoflex as of last week. Per our intake nurse everything looks a little better she is using 3 layer compression 6/13; patient presents for follow-up. She has been using Keystone with Iodoflex under compression. She was increased to 4 layer at last clinic visit. 6/22; patient presents for follow-up. We have been using Keystone antibiotics with Iodoflex under compression. She has no issues or complaints today. 6/27; patient presents for follow-up. We have been using Keystone antibiotics with PolyMem silver under compression for the past week. She has no issues or complaints today. 7/6; patient presents for follow-up. We have been using Keystone antibiotic with PolyMem silver under the compression wrap with no issues. Patient  reports improvement in wound healing. 7/11; patient presents for follow-up.  We have been using Keystone along with PolyMem silver under compression wrap. Patient has no issues or complaints today. 7/18; patient presents for follow-up. We continue to use [facility] with PolyMem silver under compression therapy. There is continued wound healing. Patient has no issues or complaints today. 8/1; patient presents for follow-up. We have been using Keystone with PolyMem silver under compression therapy. Patient has no issues or complaints today. 8/8; patient presents for follow-up. We have been using PolyMem silver under 4-layer compression. She has no issues or complaints today. 8/15; patient presents for follow-up. We have been using Xeroform under 4-layer compression. She has her compression stockings with her today. Her wound has healed. Electronic Signature(s) Signed: 11/26/2021 9:21:42 AM By: Kalman Shan DO Entered By: Kalman Shan on 11/26/2021 08:43:42 -------------------------------------------------------------------------------- Physical Exam Details Patient Name: Date of Service: [name], Jessica [name] 11/26/2021 8:15 A M Medical Record Number: [id number] Patient Account Number: [id number] Date of Birth/Sex: Treating RN: April 01, 1946 (76 y.o. F) Primary Care Provider: [name] Other Clinician: Referring Provider: Treating Provider/Extender: Loreen Freud in Treatment: 16 Constitutional respirations regular, non-labored and within target range for patient.. Cardiovascular 2+ dorsalis pedis/posterior tibialis pulses. Psychiatric pleasant and cooperative. Notes Left lower extremity: Epithelization to the previous wound site. Electronic Signature(s) Signed: 11/26/2021 9:21:42 AM By: Kalman Shan DO Entered By: Kalman Shan on 11/26/2021 08:45:37 -------------------------------------------------------------------------------- Physician Orders  Details Patient Name: Date of Service: [name], Jessica [name] 11/26/2021 8:15 A M Medical Record Number: [id number] Patient Account Number: [id number] Date of Birth/Sex: Treating RN: [birth year] (76 y.o. [name] Primary Care Provider: [name] Other Clinician: Referring Provider: Treating Provider/Extender: Loreen Freud in Treatment: 16 Verbal / Phone Orders: No Diagnosis Coding ICD-10 Coding Code Description [phone] Non-pressure chronic ulcer of other part of left lower leg with fat layer exposed T79.9XXA Unspecified early complication of trauma, initial encounter I10 Essential (primary) hypertension I89.0 Lymphedema, not elsewhere classified I87.2 Venous insufficiency (chronic) (peripheral) C50.919 Malignant neoplasm of unspecified site of unspecified female breast Discharge From [facility] Services Discharge from [place] - Call if any future wound care needs. Edema Control - Lymphedema / SCD / Other Elevate legs to the level of the heart or above for 30 minutes daily and/or when sitting, a frequency of: - 3-4 times a day throughout the day. Avoid standing for long periods of time. Patient to wear own compression stockings every day. - apply in the morning and remove at night. Exercise regularly Moisturize legs daily. - every night before bed. Compression stocking or Garment 20-30 mm/Hg pressure to: Electronic Signature(s) Signed: 11/26/2021 9:21:42 AM By: Kalman Shan DO Entered By: Kalman Shan on 11/26/2021 08:45:48 -------------------------------------------------------------------------------- Problem List Details Patient Name: Date of Service: [name], Jessica [name] 11/26/2021 8:15 A M Medical Record Number: [id number] Patient Account Number: [id number] Date of Birth/Sex: Treating RN: [birth year] (76 y.o. [name] Primary Care Provider: [name] Other Clinician: Referring Provider: Treating Provider/Extender:  Loreen Freud in Treatment: 16 Active Problems ICD-10 Encounter Code Description Active Date MDM Diagnosis [phone] Non-pressure chronic ulcer of other part of left lower leg with fat layer exposed5/12/2021 No Yes T79.9XXA Unspecified early complication of trauma, initial encounter 08/06/2021 No Yes I10 Essential (primary) hypertension 08/06/2021 No Yes I89.0 Lymphedema, not elsewhere classified 08/06/2021 No Yes I87.2 Venous insufficiency (chronic) (peripheral) 08/06/2021 No Yes C50.919 Malignant neoplasm of unspecified site of unspecified female breast 08/06/2021 No Yes Inactive Problems  Resolved Problems Electronic Signature(s) Signed: 11/26/2021 9:21:42 AM By: Kalman Shan DO Entered By: Kalman Shan on 11/26/2021 08:42:30 -------------------------------------------------------------------------------- Progress Note Details Patient Name: Date of Service: [name], Jessica [name] 11/26/2021 8:15 A M Medical Record Number: [id number] Patient Account Number: [id number] Date of Birth/Sex: Treating RN: 12/13/1945 (75 y.o. F) Primary Care Provider: [name] Other Clinician: Referring Provider: Treating Provider/Extender: Loreen Freud in Treatment: 16 Subjective Chief Complaint Information obtained from Patient 08/06/2021; left lower extremity wound following trauma History of Present Illness (HPI) Admission/20 08/2021 [name] is a 76 year old female with a past medical history of breast cancer status post bilateral mastectomy that presents the clinic for a 8-monthhistory of nonhealing ulcer to the left lower extremity. On 05/06/2021 while at work she hit her leg against the corner of a desk experiencing a decent laceration. She went to the ED and had sutures placed. These have since been removed. She has been on 2 rounds of antibiotics including Keflex and Bactrim for this issue. She reports chronic pain to the wound  site. She has been using Xeroform with dressing changes. She denies purulent drainage. 5/1; patient presents for follow-up. She started using gentamicin ointment and Hydrofera Blue over the past week. She reports improvement in her chronic pain. She has compression stockings but it is unclear if she is wearing these daily. She currently denies systemic signs of infection. She had a PCR culture done at last clinic visit that showed high levels of Staph aureus. Keystone antibiotics has been ordered and she states she is receiving this soon. 5/9; patient presents for follow-up. She tolerated the compression wrap well. We have been using gentamicin and Hydrofera Blue under the wrap. She is still not obtained her Keystone antibiotics. She states she will try to get this soon. She denies signs of infection. 5/16; patient presents for follow-up. She states she is obtaining Keystone antibiotics today. She has no issues or complaints today. She tolerated the compression wrap well. We did in office ABIs and they were 0.97 on the left. 5/23; patient presents for follow-up. She has her Keystone antibiotics with her today. She has no issues or complaints. She tolerated the compression wrap well. 5/30; patient presents for follow-up. We have been using Keystone antibiotic with Sorbact under compression wrap. She has no issues or complaints today. She denies signs of infection. 6/6; Keystone with Iodoflex as of last week. Per our intake nurse everything looks a little better she is using 3 layer compression 6/13; patient presents for follow-up. She has been using Keystone with Iodoflex under compression. She was increased to 4 layer at last clinic visit. 6/22; patient presents for follow-up. We have been using Keystone antibiotics with Iodoflex under compression. She has no issues or complaints today. 6/27; patient presents for follow-up. We have been using Keystone antibiotics with PolyMem silver under compression  for the past week. She has no issues or complaints today. 7/6; patient presents for follow-up. We have been using Keystone antibiotic with PolyMem silver under the compression wrap with no issues. Patient reports improvement in wound healing. 7/11; patient presents for follow-up. We have been using Keystone along with PolyMem silver under compression wrap. Patient has no issues or complaints today. 7/18; patient presents for follow-up. [facility] There is continued wound healing. Patient has no issues or complaints today. 8/1; patient presents for follow-up. We have been using Keystone with PolyMem silver under  compression therapy. Patient has no issues or complaints today. 8/8; patient presents for follow-up. We have been using PolyMem silver under 4-layer compression. She has no issues or complaints today. 8/15; patient presents for follow-up. We have been using Xeroform under 4-layer compression. She has her compression stockings with her today. Her wound has healed. Patient History Family History Cancer - Father, Diabetes - Father, Heart Disease - Father, Hypertension - Father. Social History Former smoker - quit 1998, Marital Status - Married, Alcohol Use - Never, Drug Use - No History, Caffeine Use - Never. Medical History Cardiovascular Patient has history of Hypertension Hospitalization/Surgery History - eyes surgery. - tonsillectomy. - hysterectomy. - bilateral mastectomy. - deviated septum from a pear falling from a tree.. Medical A Surgical History Notes nd Constitutional Symptoms (General Health) Bilateral mastectomy Breast Cancer Vitamin D deficiency Cardiovascular Ventricular tachycardia Objective Constitutional respirations regular, non-labored and within target range for patient.. Vitals Time Taken: 8:11 AM, Height: 63 in, Weight: 220 lbs, BMI: 39, Temperature: 98.2 F, Pulse: 87 bpm, Respiratory Rate: 20  breaths/min, Blood Pressure: 149/87 mmHg. Cardiovascular 2+ dorsalis pedis/posterior tibialis pulses. Psychiatric pleasant and cooperative. General Notes: Left lower extremity: Epithelization to the previous wound site. Integumentary (Hair, Skin) Wound #1 status is Open. Original cause of wound was Trauma. The date acquired was: 05/06/2021. The wound has been in treatment 16 weeks. The wound is located on the Left,Anterior Lower Leg. The wound measures 0cm length x 0cm width x 0cm depth; 0cm^2 area and 0cm^3 volume. There is no tunneling or undermining noted. There is a none present amount of drainage noted. The wound margin is distinct with the outline attached to the wound base. There is no granulation within the wound bed. There is no necrotic tissue within the wound bed. Assessment Active Problems ICD-10 Non-pressure chronic ulcer of other part of left lower leg with fat layer exposed Unspecified early complication of trauma, initial encounter Essential (primary) hypertension Lymphedema, not elsewhere classified Venous insufficiency (chronic) (peripheral) Malignant neoplasm of unspecified site of unspecified female breast Patient has done well with 4-layer compression and Xeroform. Her wound is healed. She has compression stockings and I recommended wearing these daily. She may follow-up as needed. Plan Discharge From [facility] Services: Discharge from [place] - Call if any future wound care needs. Edema Control - Lymphedema / SCD / Other: Elevate legs to the level of the heart or above for 30 minutes daily and/or when sitting, a frequency of: - 3-4 times a day throughout the day. Avoid standing for long periods of time. Patient to wear own compression stockings every day. - apply in the morning and remove at night. Exercise regularly Moisturize legs daily. - every night before bed. Compression stocking or Garment 20-30 mm/Hg pressure to: 1. Compression stockings daily 2.  Discharge from clinic due to closed wound 3. Follow-up as needed Electronic Signature(s) Signed: 11/26/2021 9:21:42 AM By: Kalman Shan DO Entered By: Kalman Shan on 11/26/2021 08:47:05 -------------------------------------------------------------------------------- HxROS Details Patient Name: Date of Service: [name], Jessica [name] 11/26/2021 8:15 A M Medical Record Number: [id number] Patient Account Number: [id number] Date of Birth/Sex: Treating RN: [birth year] (76 y.o. F) Primary Care Provider: [name] Other Clinician: Referring Provider: Treating Provider/Extender: Loreen Freud in Treatment: 16 Constitutional Symptoms (General Health) Medical History: Past Medical History Notes: Bilateral mastectomy Breast Cancer Vitamin D deficiency Cardiovascular Medical History: Positive for: Hypertension Past Medical History Notes: Ventricular tachycardia Immunizations Pneumococcal Vaccine: Received Pneumococcal Vaccination: No Implantable Devices  No devices added Hospitalization / Surgery History Type of Hospitalization/Surgery eyes surgery tonsillectomy hysterectomy bilateral mastectomy deviated septum from a pear falling from a tree. Family and Social History Cancer: Yes - Father; Diabetes: Yes - Father; Heart Disease: Yes - Father; Hypertension: Yes - Father; Former smoker - quit 1998; Marital Status - Married; Alcohol Use: Never; Drug Use: No History; Caffeine Use: Never; Financial Concerns: No; Food, Clothing or Shelter Needs: No; Support System Lacking: No; Transportation Concerns: No Electronic Signature(s) Signed: 11/26/2021 9:21:42 AM By: Kalman Shan DO Entered By: Kalman Shan on 11/26/2021 08:43:47 -------------------------------------------------------------------------------- SuperBill Details Patient Name: Date of Service: [name], Jessica [name] 11/26/2021 Medical Record Number: [id number] Patient Account Number:  [id number] Date of Birth/Sex: Treating RN: 1945/08/21 (76 y.o. [name] Primary Care Provider: [name] Other Clinician: Referring Provider: Treating Provider/Extender: Loreen Freud in Treatment: 16 Diagnosis Coding ICD-10 Codes Code Description [phone] Non-pressure chronic ulcer of other part of left lower leg with fat layer exposed T79.9XXA Unspecified early complication of trauma, initial encounter I10 Essential (primary) hypertension I89.0 Lymphedema, not elsewhere classified I87.2 Venous insufficiency (chronic) (peripheral) C50.919 Malignant neoplasm of unspecified site of unspecified female breast Facility Procedures CPT4 Code: [id number] Description: 99213 - WOUND CARE VISIT-LEV 3 EST PT Modifier: Quantity: 1 Physician Procedures : CPT4 Code Description Modifier [id number] - WC PHYS LEVEL 3 - EST PT ICD-10 Diagnosis Description L97.822 Non-pressure chronic ulcer of other part of left lower leg with fat layer exposed I89.0 Lymphedema, not elsewhere classified I87.2 Venous  insufficiency (chronic) (peripheral) T79.9XXA Unspecified early complication of trauma, initial encounter Quantity: 1 Electronic Signature(s) Signed: 11/26/2021 9:21:42 AM By: Kalman Shan DO Entered By: Kalman Shan on 11/26/2021 08:47:55

## 2022-02-03 DIAGNOSIS — Z853 Personal history of malignant neoplasm of breast: Secondary | ICD-10-CM | POA: Diagnosis not present

## 2022-02-03 DIAGNOSIS — I10 Essential (primary) hypertension: Secondary | ICD-10-CM | POA: Diagnosis not present

## 2022-02-03 DIAGNOSIS — Z0000 Encounter for general adult medical examination without abnormal findings: Secondary | ICD-10-CM | POA: Diagnosis not present

## 2022-02-03 DIAGNOSIS — R1310 Dysphagia, unspecified: Secondary | ICD-10-CM | POA: Diagnosis not present

## 2022-02-03 DIAGNOSIS — Z136 Encounter for screening for cardiovascular disorders: Secondary | ICD-10-CM | POA: Diagnosis not present

## 2022-02-03 DIAGNOSIS — Z23 Encounter for immunization: Secondary | ICD-10-CM | POA: Diagnosis not present

## 2022-02-05 ENCOUNTER — Other Ambulatory Visit: Payer: Self-pay | Admitting: Family Medicine

## 2022-02-05 DIAGNOSIS — Z853 Personal history of malignant neoplasm of breast: Secondary | ICD-10-CM

## 2022-02-21 ENCOUNTER — Ambulatory Visit (INDEPENDENT_AMBULATORY_CARE_PROVIDER_SITE_OTHER): Payer: PRIVATE HEALTH INSURANCE

## 2022-02-21 DIAGNOSIS — Z853 Personal history of malignant neoplasm of breast: Secondary | ICD-10-CM

## 2022-03-27 DIAGNOSIS — N3946 Mixed incontinence: Secondary | ICD-10-CM | POA: Diagnosis not present

## 2022-03-27 DIAGNOSIS — R350 Frequency of micturition: Secondary | ICD-10-CM | POA: Diagnosis not present

## 2022-04-03 DIAGNOSIS — J329 Chronic sinusitis, unspecified: Secondary | ICD-10-CM | POA: Diagnosis not present

## 2022-04-03 DIAGNOSIS — R059 Cough, unspecified: Secondary | ICD-10-CM | POA: Diagnosis not present

## 2022-04-03 DIAGNOSIS — J209 Acute bronchitis, unspecified: Secondary | ICD-10-CM | POA: Diagnosis not present

## 2022-05-14 DIAGNOSIS — R350 Frequency of micturition: Secondary | ICD-10-CM | POA: Diagnosis not present

## 2022-05-14 DIAGNOSIS — N3946 Mixed incontinence: Secondary | ICD-10-CM | POA: Diagnosis not present

## 2022-06-18 DIAGNOSIS — N3946 Mixed incontinence: Secondary | ICD-10-CM | POA: Diagnosis not present

## 2022-06-18 DIAGNOSIS — R350 Frequency of micturition: Secondary | ICD-10-CM | POA: Diagnosis not present

## 2022-07-24 DIAGNOSIS — R350 Frequency of micturition: Secondary | ICD-10-CM | POA: Diagnosis not present

## 2022-07-24 DIAGNOSIS — N3946 Mixed incontinence: Secondary | ICD-10-CM | POA: Diagnosis not present

## 2022-07-31 DIAGNOSIS — R350 Frequency of micturition: Secondary | ICD-10-CM | POA: Diagnosis not present

## 2022-08-07 DIAGNOSIS — R350 Frequency of micturition: Secondary | ICD-10-CM | POA: Diagnosis not present

## 2022-08-14 DIAGNOSIS — R350 Frequency of micturition: Secondary | ICD-10-CM | POA: Diagnosis not present

## 2022-08-21 DIAGNOSIS — N3946 Mixed incontinence: Secondary | ICD-10-CM | POA: Diagnosis not present

## 2022-08-28 DIAGNOSIS — N3946 Mixed incontinence: Secondary | ICD-10-CM | POA: Diagnosis not present

## 2022-08-28 DIAGNOSIS — R350 Frequency of micturition: Secondary | ICD-10-CM | POA: Diagnosis not present

## 2022-09-01 ENCOUNTER — Ambulatory Visit (INDEPENDENT_AMBULATORY_CARE_PROVIDER_SITE_OTHER): Payer: Medicare HMO

## 2022-09-01 ENCOUNTER — Other Ambulatory Visit: Payer: Self-pay | Admitting: Family Medicine

## 2022-09-01 DIAGNOSIS — R0602 Shortness of breath: Secondary | ICD-10-CM | POA: Diagnosis not present

## 2022-09-01 DIAGNOSIS — Z6841 Body Mass Index (BMI) 40.0 and over, adult: Secondary | ICD-10-CM | POA: Diagnosis not present

## 2022-09-01 DIAGNOSIS — I10 Essential (primary) hypertension: Secondary | ICD-10-CM | POA: Diagnosis not present

## 2022-09-01 DIAGNOSIS — R7301 Impaired fasting glucose: Secondary | ICD-10-CM | POA: Diagnosis not present

## 2022-09-01 DIAGNOSIS — Z9481 Bone marrow transplant status: Secondary | ICD-10-CM | POA: Diagnosis not present

## 2022-09-04 ENCOUNTER — Ambulatory Visit: Payer: Medicare HMO | Admitting: Cardiology

## 2022-09-04 ENCOUNTER — Encounter: Payer: Self-pay | Admitting: Cardiology

## 2022-09-04 VITALS — BP 168/80 | HR 95 | Ht 64.0 in | Wt 227.0 lb

## 2022-09-04 DIAGNOSIS — R0602 Shortness of breath: Secondary | ICD-10-CM | POA: Diagnosis not present

## 2022-09-04 DIAGNOSIS — R0989 Other specified symptoms and signs involving the circulatory and respiratory systems: Secondary | ICD-10-CM | POA: Diagnosis not present

## 2022-09-04 DIAGNOSIS — R011 Cardiac murmur, unspecified: Secondary | ICD-10-CM

## 2022-09-04 DIAGNOSIS — I10 Essential (primary) hypertension: Secondary | ICD-10-CM | POA: Diagnosis not present

## 2022-09-04 DIAGNOSIS — R350 Frequency of micturition: Secondary | ICD-10-CM | POA: Diagnosis not present

## 2022-09-04 MED ORDER — TORSEMIDE 10 MG PO TABS: 10.0000 mg | ORAL_TABLET | Freq: Every morning | ORAL | 0 refills | Status: DC

## 2022-09-04 NOTE — Progress Notes (Signed)
ID:  [name], DOB July 21, 1945, MRN [id number]  PCP:  Joycelyn Rua, MD  Cardiologist:  Tessa Lerner, DO, [facility] (established care Sep 04, 2022)  REASON FOR CONSULT: Shortness of breath  REQUESTING PHYSICIAN:  Joycelyn Rua, MD [address] [place],  [place] [address]  Chief Complaint  Patient presents with   Shortness of Breath   New Patient (Initial Visit)    HPI  [name] is a 76 y.o. Caucasian female who presents to the clinic for evaluation of shortness of breath at the request of Joycelyn Rua, MD. Her past medical history and cardiovascular risk factors include: HTN, former smoker, hx breast cancer x2 (status postmastectomy, radiation, chemotherapy), history of ventricular tachycardia.  Patient presents today to the office for evaluation of shortness of breath. Symptoms ongoing for 6 months. Progressive. More pronounced with exertion. Improves with resting. Bilateral lower extremity swelling. Experiences wheezing when she lays down.   Two-pillow orthopnea (chronic and stable)  Patient unsure why she has not seek medical attention sooner.  Patient states that she has a history of ventricular tachycardia that was diagnosed and: She was transferred to [facility] for heart catheterization but no interventions were performed.  No prior records available for review regarding this.  Patient is not able to elaborate further.  She denies near-syncope or syncopal events.  She denies anginal chest pain at rest or with effort related activities.   ALLERGIES: Allergies  Allergen Reactions   Penicillin G Anaphylaxis    Other Reaction(s): dry mouth   Penicillins Rash and Shortness Of Breath    Other Reaction(s): rash/sob   Bee Venom Swelling    Bee stings   Morphine Other (See Comments) and Nausea And Vomiting    MEDICATION LIST PRIOR TO VISIT: Current Meds  Medication Sig   albuterol (VENTOLIN HFA) 108 (90 Base) MCG/ACT inhaler Inhale 2 puffs into the  lungs.   B Complex Vitamins (B COMPLEX PO) Take 1 capsule by mouth.   lisinopril-hydrochlorothiazide (ZESTORETIC) 20-12.5 MG tablet Take 1 tablet by mouth daily.   Multiple Vitamin (MULTIVITAMINS PO) 1 tablet   Naproxen Sodium (ALEVE PO) Take 1 tablet by mouth 2 (two) times daily.   pantoprazole (PROTONIX) 40 MG tablet Take 40 mg by mouth daily.   Specialty Vitamins Products (COLLAGEN ULTRA) CAPS See admin instructions.   torsemide (DEMADEX) 10 MG tablet Take 1 tablet (10 mg total) by mouth every morning.   VITAMIN D PO Take 1,000 Units by mouth in the morning and at bedtime.     PAST MEDICAL HISTORY: Past Medical History:  Diagnosis Date   Allergic rhinitis    Breast cancer, left (HCC) 2007   ductal carcinoma in situ, underwent left mastectomy 07/2005; reconstructive surgery with expander, permanent implant placed 03/2006, hospitalized January 2008 with breast cellulitis.   Breast cancer, right (HCC) 1998   1998; modified radical right mastectomy 1998 for infiltrating ductal adenocarcinoma with 7 of 12 axillary nodes positive. Underwent adjuvant chemotherapy and bone marrow transplant by Dr Greggory Stallion as well as radiation therapy. Also had Tram flap reconstruction.   History of ventricular tachycardia    Hypertension    Vitamin D deficiency     PAST SURGICAL HISTORY: Past Surgical History:  Procedure Laterality Date   biopsy on scalp  2010   breast implant Left 2007   CESAREAN SECTION     childbirth     31, 66, 67, 70   EYE SURGERY  1952   hysterectomy with one  ovary/due to fibroids  1987   left mastectomy Left 2007   NASAL SEPTUM SURGERY  1975   PORTACATH PLACEMENT  1998   right mastectomy Right 1998   stem cell infusion  1998   surgery for crossed eyes     TONSILLECTOMY  1961   tran flap R breast Right 1998   tri-port  1998   TUBAL LIGATION      FAMILY HISTORY: The patient family history includes Arrhythmia in her sister; Blindness in her paternal grandfather; Breast cancer  in her paternal aunt; Dementia in her mother; Diabetes in her paternal grandmother; Heart Problems in her maternal grandfather and paternal grandmother; Hypertension in her maternal grandmother and sister; Prostate cancer in her father.  SOCIAL HISTORY:  The patient  reports that she quit smoking about 26 years ago. Her smoking use included cigarettes. She started smoking about 62 years ago. She smoked an average of 1 pack per day. She has never used smokeless tobacco. She reports current alcohol use. She reports that she does not use drugs.  REVIEW OF SYSTEMS: Review of Systems  Cardiovascular:  Positive for dyspnea on exertion and leg swelling. Negative for chest pain, claudication, irregular heartbeat, near-syncope, orthopnea, palpitations, paroxysmal nocturnal dyspnea and syncope.  Respiratory:  Positive for shortness of breath.   Hematologic/Lymphatic: Negative for bleeding problem.  Musculoskeletal:  Negative for muscle cramps and myalgias.  Neurological:  Negative for dizziness and light-headedness.    PHYSICAL EXAM:    09/04/2022   10:15 AM 05/06/2021    2:30 PM 05/06/2021    2:27 PM  Vitals with BMI  Height 5\' 4"  5\' 4"    Weight 227 lbs 222 lbs   BMI 38.95 38.09   Systolic 168  163  Diastolic 80  77  Pulse 95  85    Physical Exam  Constitutional: No distress.  Appears older than stated age, ambulates with a cane,, hemodynamically stable.   Neck: No JVD present.  Cardiovascular: Regular rhythm, S1 normal, S2 normal, intact distal pulses and normal pulses. Tachycardia present. Exam reveals no gallop, no S3 and no S4.  Murmur heard. Harsh systolic murmur is present with a grade of 3/6 at the upper right sternal border radiating to the neck. Pulses:      Carotid pulses are  on the right side with bruit and  on the left side with bruit. Pulmonary/Chest: Effort normal and breath sounds normal. No stridor. She has no wheezes. She has no rales.  Bilateral mastectomies  Abdominal:  Soft. Bowel sounds are normal. She exhibits no distension. There is no abdominal tenderness.  Musculoskeletal:        General: Tenderness and edema present.     Cervical back: Neck supple.  Neurological: She is alert and oriented to person, place, and time. She has intact cranial nerves (2-12).  Skin: Skin is warm and moist.   CARDIAC DATABASE: EKG: Sep 04, 2022: Sinus rhythm, 79 bpm, left axis, left anterior fascicular block, right bundle branch block, without underlying injury pattern.  Echocardiogram: No results found for this or any previous visit from the past 1095 days.    Stress Testing: No results found for this or any previous visit from the past 1095 days.   Heart Catheterization: None  LABORATORY DATA:    Latest Ref Rng & Units 09/04/2022   12:15 PM 10/10/2020   11:20 AM  CBC  WBC 3.4 - 10.8 x10E3/uL  6.7   Hemoglobin 11.1 - 15.9 g/dL 16.1  09.6  Hematocrit 34.0 - 46.6 % 42.6  45.0   Platelets 150 - 450 x10E3/uL  183        Latest Ref Rng & Units 09/04/2022   12:15 PM 10/10/2020   11:20 AM 09/28/2007    3:30 PM  CMP  Glucose 70 - 99 mg/dL 536  644    BUN 8 - 27 mg/dL 13  13  12  RESULT CALLED TO, READ BACK BY AND VERIFIED WITH: CAROL WOMBLE @1619  BY TRANSOU,P.   Creatinine 0.57 - 1.00 mg/dL 0.34  7.42  .6 RESULT CALLED TO, READ BACK BY AND VERIFIED WITH: CAROL WOMBLE @1619  BY TRANSOU,P.   Sodium 134 - 144 mmol/L 142  137    Potassium 3.5 - 5.2 mmol/L 4.2  4.2    Chloride 96 - 106 mmol/L 99  97    CO2 20 - 29 mmol/L 26  25    Calcium 8.7 - 10.3 mg/dL 9.7  9.8    Total Protein 6.0 - 8.5 g/dL  6.9    Total Bilirubin 0.0 - 1.2 mg/dL  0.4    Alkaline Phos 44 - 121 IU/L  74    AST 0 - 40 IU/L  44    ALT 0 - 32 IU/L  28      Lipid Panel  No results found for: "CHOL", "TRIG", "HDL", "CHOLHDL", "VLDL", "LDLCALC", "LDLDIRECT", "LABVLDL"  No components found for: "NTPROBNP" No results for input(s): "PROBNP" in the last 8760 hours. No results for input(s): "TSH"  in the last 8760 hours.  BMP Recent Labs    09/04/22 1215  NA 142  K 4.2  CL 99  CO2 26  GLUCOSE 146*  BUN 13  CREATININE 0.66  CALCIUM 9.7    HEMOGLOBIN A1C No results found for: "HGBA1C", "MPG"  External Labs: Collected: February 03, 2022. BUN 14, creatinine 0.59. Sodium 139, potassium 3.9, chloride 99, bicarb 33. Total cholesterol 194, triglycerides 123, HDL 54, calculated LDL 118, non-HDL 140   IMPRESSION:    ICD-10-CM   1. Shortness of breath  R06.02 EKG 12-Lead    PCV MYOCARDIAL PERFUSION WITH LEXISCAN    Basic metabolic panel    Magnesium    Hemoglobin and hematocrit, blood    ECHOCARDIOGRAM COMPLETE    torsemide (DEMADEX) 10 MG tablet    CANCELED: Pro b natriuretic peptide (BNP)    2. Murmur  R01.1 ECHOCARDIOGRAM COMPLETE    3. Bilateral carotid bruits  R09.89 PCV CAROTID DUPLEX (BILATERAL)       RECOMMENDATIONS: [name] is a 77 y.o. Caucasian female whose past medical history and cardiac risk factors include: HTN, former smoker, hx breast cancer x2 (status postmastectomy, radiation, chemotherapy), history of ventricular tachycardia.  Patient presents to the office for evaluation of shortness of breath.  Based on symptoms and physical examination I suspect her shortness of breath is likely secondary to underlying valvular heart disease.  I suspect she has underlying aortic stenosis and degree of mitral regurgitation.  Will order an echocardiogram to further evaluate for structural heart disease and LVEF.  I would like to obtain baseline labs before initiating diuretics.  Patient is asked to get labs today start torsemide 10 mg p.o. daily.  She will need repeat labs a week later to follow renal function and electrolytes.  She has bilateral carotid bruits likely secondary from underlying valvular heart disease but organic carotid disease cannot be ruled out.  Will order carotid duplex for further evaluation.  Patient states that she had a  history of  ventricular tachycardia that was diagnosed at [facility] several years ago at that time she was transferred to [facility] health for left heart catheterization.  No interventions were performed.  But she is unable to provide additional details.  No records available in epic or Care Everywhere.  Given her history, dyspnea on exertion, volume overload, recommend ischemic workup.  EKG is interpretable patient is unable to exercise therefore we will request pharmacological stress to evaluate for reversible ischemia to further risk stratify the patient.  I have asked her to keep a log of her blood pressures at home and to review with with either myself or PCP at the upcoming visit.  Patient is currently agreeable with the plan of care I will see her in short follow-up.  Data Reviewed: I have independently reviewed external notes provided by the referring provider as part of this office visit.   I have independently reviewed results of EKG, labs as part of medical decision making. I have ordered the following tests:  Orders Placed This Encounter  Procedures   Basic metabolic panel   Magnesium   Hemoglobin and hematocrit, blood   PCV MYOCARDIAL PERFUSION WITH LEXISCAN    Standing Status:   Future    Standing Expiration Date:   09/04/2023   EKG 12-Lead   ECHOCARDIOGRAM COMPLETE    Evaluate for aortic stenosis and strain imaging given her history of chemo and radiation    Standing Status:   Future    Standing Expiration Date:   09/04/2023    Scheduling Instructions:     Evaluate for aortic stenosis and strain imaging given her history of chemo and radiation    Order Specific Question:   Where should this test be performed    Answer:   [place]    Order Specific Question:   Perflutren DEFINITY (image enhancing agent) should be administered unless hypersensitivity or allergy exist    Answer:   Administer Perflutren    Order Specific Question:   Reason for exam-Echo    Answer:   Murmur R01.1     Order Specific Question:   Reason for exam-Echo    Answer:   Dyspnea  R06.00    Order Specific Question:   Other Comments    Answer:   Evaluate for aortic stenosis and strain imaging given her history of chemo and radiation   I have made medications changes at today's encounter as noted above.  FINAL MEDICATION LIST END OF ENCOUNTER: Meds ordered this encounter  Medications   torsemide (DEMADEX) 10 MG tablet    Sig: Take 1 tablet (10 mg total) by mouth every morning.    Dispense:  30 tablet    Refill:  0    Medications Discontinued During This Encounter  Medication Reason   lisinopril-hydrochlorothiazide (ZESTORETIC) 10-12.5 MG tablet      Current Outpatient Medications:    albuterol (VENTOLIN HFA) 108 (90 Base) MCG/ACT inhaler, Inhale 2 puffs into the lungs., Disp: , Rfl:    B Complex Vitamins (B COMPLEX PO), Take 1 capsule by mouth., Disp: , Rfl:    lisinopril-hydrochlorothiazide (ZESTORETIC) 20-12.5 MG tablet, Take 1 tablet by mouth daily., Disp: , Rfl:    Multiple Vitamin (MULTIVITAMINS PO), 1 tablet, Disp: , Rfl:    Naproxen Sodium (ALEVE PO), Take 1 tablet by mouth 2 (two) times daily., Disp: , Rfl:    pantoprazole (PROTONIX) 40 MG tablet, Take 40 mg by mouth daily., Disp: , Rfl:    Specialty Vitamins  Products (COLLAGEN ULTRA) CAPS, See admin instructions., Disp: , Rfl:    torsemide (DEMADEX) 10 MG tablet, Take 1 tablet (10 mg total) by mouth every morning., Disp: 30 tablet, Rfl: 0   VITAMIN D PO, Take 1,000 Units by mouth in the morning and at bedtime., Disp: , Rfl:   Orders Placed This Encounter  Procedures   Basic metabolic panel   Magnesium   Hemoglobin and hematocrit, blood   PCV MYOCARDIAL PERFUSION WITH LEXISCAN   EKG 12-Lead   ECHOCARDIOGRAM COMPLETE   PCV CAROTID DUPLEX (BILATERAL)    There are no Patient Instructions on file for this visit.   --Continue cardiac medications as reconciled in final medication list. --Return in about 4 weeks (around  10/02/2022) for Follow up, Dyspnea. or sooner if needed. --Continue follow-up with your primary care physician regarding the management of your other chronic comorbid conditions.  Patient's questions and concerns were addressed to her satisfaction. She voices understanding of the instructions provided during this encounter.   This note was created using a voice recognition software as a result there may be grammatical errors inadvertently enclosed that do not reflect the nature of this encounter. Every attempt is made to correct such errors.  Tessa Lerner, [place], [facility]  Pager:  [phone] Office: [phone]

## 2022-09-05 ENCOUNTER — Other Ambulatory Visit: Payer: Self-pay

## 2022-09-05 DIAGNOSIS — R0602 Shortness of breath: Secondary | ICD-10-CM

## 2022-09-05 LAB — BASIC METABOLIC PANEL
BUN/Creatinine Ratio: 20 (ref 12–28)
BUN: 13 mg/dL (ref 8–27)
CO2: 26 mmol/L (ref 20–29)
Calcium: 9.7 mg/dL (ref 8.7–10.3)
Chloride: 99 mmol/L (ref 96–106)
Creatinine, Ser: 0.66 mg/dL (ref 0.57–1.00)
Glucose: 146 mg/dL — ABNORMAL HIGH (ref 70–99)
Potassium: 4.2 mmol/L (ref 3.5–5.2)
Sodium: 142 mmol/L (ref 134–144)
eGFR: 91 mL/min/{1.73_m2} (ref >59)

## 2022-09-05 LAB — MAGNESIUM: Magnesium: 1.9 mg/dL (ref 1.6–2.3)

## 2022-09-05 LAB — HEMOGLOBIN AND HEMATOCRIT, BLOOD
Hematocrit: 42.6 % (ref 34.0–46.6)
Hemoglobin: 13.9 g/dL (ref 11.1–15.9)

## 2022-09-05 NOTE — Progress Notes (Signed)
Called patient no answer due to vm not being set up. Orders has been ordered.

## 2022-09-09 ENCOUNTER — Ambulatory Visit (HOSPITAL_COMMUNITY)
Admission: RE | Admit: 2022-09-09 | Discharge: 2022-09-09 | Disposition: A | Discharge status: Home / Self Care | Payer: Medicare HMO | Source: Ambulatory Visit | Attending: Cardiology | Admitting: Cardiology

## 2022-09-09 ENCOUNTER — Other Ambulatory Visit: Payer: Medicare HMO

## 2022-09-09 DIAGNOSIS — R0600 Dyspnea, unspecified: Secondary | ICD-10-CM | POA: Insufficient documentation

## 2022-09-09 DIAGNOSIS — I517 Cardiomegaly: Secondary | ICD-10-CM | POA: Diagnosis not present

## 2022-09-09 DIAGNOSIS — R0602 Shortness of breath: Secondary | ICD-10-CM | POA: Insufficient documentation

## 2022-09-09 DIAGNOSIS — Z9012 Acquired absence of left breast and nipple: Secondary | ICD-10-CM | POA: Diagnosis not present

## 2022-09-09 DIAGNOSIS — I371 Nonrheumatic pulmonary valve insufficiency: Secondary | ICD-10-CM | POA: Insufficient documentation

## 2022-09-09 DIAGNOSIS — I089 Rheumatic multiple valve disease, unspecified: Secondary | ICD-10-CM | POA: Diagnosis not present

## 2022-09-09 DIAGNOSIS — R011 Cardiac murmur, unspecified: Secondary | ICD-10-CM | POA: Diagnosis not present

## 2022-09-09 NOTE — Progress Notes (Signed)
Called patient no answer due to vm not being set up. Orders has been ordered.

## 2022-09-11 ENCOUNTER — Telehealth: Payer: Self-pay

## 2022-09-11 ENCOUNTER — Ambulatory Visit: Payer: Medicare HMO

## 2022-09-11 DIAGNOSIS — R350 Frequency of micturition: Secondary | ICD-10-CM | POA: Diagnosis not present

## 2022-09-11 DIAGNOSIS — R0602 Shortness of breath: Secondary | ICD-10-CM

## 2022-09-11 NOTE — Telephone Encounter (Signed)
Left detailed message explaining when to go have labs drawn.

## 2022-09-11 NOTE — Telephone Encounter (Signed)
-----   Message from [place] Mares sent at 09/05/2022  4:48 PM EDT ----- Called patient no answer due to vm not being set up. Orders has been ordered.

## 2022-09-13 DIAGNOSIS — R011 Cardiac murmur, unspecified: Secondary | ICD-10-CM | POA: Insufficient documentation

## 2022-09-13 DIAGNOSIS — R0602 Shortness of breath: Secondary | ICD-10-CM | POA: Insufficient documentation

## 2022-09-13 LAB — ECHOCARDIOGRAM COMPLETE: S' Lateral: 2.7 cm

## 2022-09-14 LAB — PCV MYOCARDIAL PERFUSION WITH LEXISCAN: ST Depression (mm): 0 mm

## 2022-09-16 NOTE — Progress Notes (Signed)
Called patient to inform her about the message above. Patient message that they could not finnish the procedure due to her implants. They told her that they would have to go to her throat instead.

## 2022-09-18 DIAGNOSIS — R350 Frequency of micturition: Secondary | ICD-10-CM | POA: Diagnosis not present

## 2022-09-22 ENCOUNTER — Ambulatory Visit: Payer: Medicare HMO

## 2022-09-22 DIAGNOSIS — E119 Type 2 diabetes mellitus without complications: Secondary | ICD-10-CM | POA: Diagnosis not present

## 2022-09-22 DIAGNOSIS — R0989 Other specified symptoms and signs involving the circulatory and respiratory systems: Secondary | ICD-10-CM

## 2022-09-22 DIAGNOSIS — R0602 Shortness of breath: Secondary | ICD-10-CM | POA: Diagnosis not present

## 2022-09-22 DIAGNOSIS — Z9481 Bone marrow transplant status: Secondary | ICD-10-CM | POA: Diagnosis not present

## 2022-09-22 DIAGNOSIS — I10 Essential (primary) hypertension: Secondary | ICD-10-CM | POA: Diagnosis not present

## 2022-09-22 DIAGNOSIS — I700 Atherosclerosis of aorta: Secondary | ICD-10-CM | POA: Diagnosis not present

## 2022-09-22 DIAGNOSIS — Z9181 History of falling: Secondary | ICD-10-CM | POA: Diagnosis not present

## 2022-09-23 NOTE — Progress Notes (Signed)
New appointment : 09/25/2022

## 2022-09-23 NOTE — Progress Notes (Signed)
Patient has already has already had them done, is there another set of labs she needs to do, because she said that she called here, and someone told her they already had the results. Please advise.

## 2022-09-25 ENCOUNTER — Encounter: Payer: Self-pay | Admitting: Cardiology

## 2022-09-25 ENCOUNTER — Ambulatory Visit: Payer: Medicare HMO | Admitting: Cardiology

## 2022-09-25 VITALS — BP 118/72 | HR 83 | Ht 64.0 in | Wt 218.0 lb

## 2022-09-25 DIAGNOSIS — R350 Frequency of micturition: Secondary | ICD-10-CM | POA: Diagnosis not present

## 2022-09-25 DIAGNOSIS — R011 Cardiac murmur, unspecified: Secondary | ICD-10-CM | POA: Diagnosis not present

## 2022-09-25 DIAGNOSIS — I10 Essential (primary) hypertension: Secondary | ICD-10-CM | POA: Diagnosis not present

## 2022-09-25 DIAGNOSIS — R9439 Abnormal result of other cardiovascular function study: Secondary | ICD-10-CM

## 2022-09-25 DIAGNOSIS — R0602 Shortness of breath: Secondary | ICD-10-CM | POA: Diagnosis not present

## 2022-09-25 NOTE — Progress Notes (Signed)
ID:  JULIENNE GUNDY, DOB December 21, 1945, MRN [id number]  PCP:  Joycelyn Rua, MD  Cardiologist:  Tessa Lerner, DO, [facility] (established care Sep 04, 2022)  Date: 09/25/22 Last Office Visit: 09/04/2022  Chief Complaint  Patient presents with   Shortness of Breath   Follow-up    HPI  [name] is a 77 y.o. Caucasian female whose past medical history and cardiovascular risk factors include: HTN, former smoker, hx breast cancer x2 (status postmastectomy, radiation, chemotherapy), history of ventricular tachycardia.  Patient was referred to the practice for evaluation of shortness of breath.  At the last office visit the concern was that she may have underlying valvular heart disease that is contributory to her symptoms.  She was recommended to have an echocardiogram and stress test as her symptoms could be anginal equivalents as well.  She presents today for follow-up.  Since last office visit she continues to have dyspnea on exertion with day-to-day activities.  Echocardiography noted preserved LVEF but due to prior mastectomies/radiation/chemo the aortic valve was not well-visualized on transthoracic study.  Transesophageal echocardiogram was recommended.  Given the degree of dyspnea patient also underwent stress test which notes reversible ischemia in the inferior & inferolateral segments.  ALLERGIES: Allergies  Allergen Reactions   Penicillin G Anaphylaxis    Other Reaction(s): dry mouth  Other Reaction(s): Rash/SOB   Penicillins Rash and Shortness Of Breath    Other Reaction(s): rash/sob   Bee Venom Swelling    Bee stings   Morphine Other (See Comments) and Nausea And Vomiting    MEDICATION LIST PRIOR TO VISIT: Current Meds  Medication Sig   B Complex Vitamins (B COMPLEX PO) Take 1 capsule by mouth.   lisinopril-hydrochlorothiazide (ZESTORETIC) 20-12.5 MG tablet Take 1 tablet by mouth daily.   Multiple Vitamin (MULTIVITAMINS PO) 1 tablet   Naproxen Sodium (ALEVE PO) Take 1  tablet by mouth 2 (two) times daily.   pantoprazole (PROTONIX) 40 MG tablet Take 40 mg by mouth daily.   Specialty Vitamins Products (COLLAGEN ULTRA) CAPS See admin instructions.   torsemide (DEMADEX) 10 MG tablet Take 1 tablet (10 mg total) by mouth every morning.   VITAMIN D PO Take 1,000 Units by mouth in the morning and at bedtime.     PAST MEDICAL HISTORY: Past Medical History:  Diagnosis Date   Allergic rhinitis    Breast cancer, left (HCC) 2007   ductal carcinoma in situ, underwent left mastectomy 07/2005; reconstructive surgery with expander, permanent implant placed 03/2006, hospitalized January 2008 with breast cellulitis.   Breast cancer, right (HCC) 1998   1998; modified radical right mastectomy 1998 for infiltrating ductal adenocarcinoma with 7 of 12 axillary nodes positive. Underwent adjuvant chemotherapy and bone marrow transplant by Dr Greggory Stallion as well as radiation therapy. Also had Tram flap reconstruction.   History of ventricular tachycardia    Hypertension    Vitamin D deficiency     PAST SURGICAL HISTORY: Past Surgical History:  Procedure Laterality Date   biopsy on scalp  2010   breast implant Left 2007   CESAREAN SECTION     childbirth     63, 66, 67, 70   EYE SURGERY  1952   hysterectomy with one ovary/due to fibroids  1987   left mastectomy Left 2007   NASAL SEPTUM SURGERY  1975   PORTACATH PLACEMENT  1998   right mastectomy Right 1998   stem cell infusion  1998   surgery for crossed eyes     TONSILLECTOMY  1961   tran flap R breast Right 1998   tri-port  1998   TUBAL LIGATION      FAMILY HISTORY: The patient family history includes Arrhythmia in her sister; Blindness in her paternal grandfather; Breast cancer in her paternal aunt; Dementia in her mother; Diabetes in her paternal grandmother; Heart Problems in her maternal grandfather and paternal grandmother; Hypertension in her maternal grandmother and sister; Prostate cancer in her father.  SOCIAL  HISTORY:  The patient  reports that she quit smoking about 26 years ago. Her smoking use included cigarettes. She started smoking about 62 years ago. She smoked an average of 1 pack per day. She has never used smokeless tobacco. She reports current alcohol use. She reports that she does not use drugs.  REVIEW OF SYSTEMS: Review of Systems  Cardiovascular:  Positive for dyspnea on exertion and leg swelling. Negative for chest pain, claudication, irregular heartbeat, near-syncope, orthopnea, palpitations, paroxysmal nocturnal dyspnea and syncope.  Respiratory:  Positive for shortness of breath.   Hematologic/Lymphatic: Negative for bleeding problem.  Musculoskeletal:  Negative for muscle cramps and myalgias.  Neurological:  Negative for dizziness and light-headedness.    PHYSICAL EXAM:    09/25/2022    2:38 PM 09/04/2022   10:15 AM 05/06/2021    2:30 PM  Vitals with BMI  Height 5\' 4"  5\' 4"  5\' 4"   Weight 218 lbs 227 lbs 222 lbs  BMI 37.4 38.95 38.09  Systolic 118 168   Diastolic 72 80   Pulse 83 95     Physical Exam  Constitutional: No distress.  Appears older than stated age, ambulates with a cane,, hemodynamically stable.   Neck: No JVD present.  Cardiovascular: Normal rate, regular rhythm, S1 normal, S2 normal, intact distal pulses and normal pulses. Exam reveals no gallop, no S3 and no S4.  Murmur heard. Harsh systolic murmur is present with a grade of 3/6 at the upper right sternal border radiating to the neck. Pulses:      Carotid pulses are  on the right side with bruit and  on the left side with bruit. Pulmonary/Chest: Effort normal and breath sounds normal. No stridor. She has no wheezes. She has no rales.  Bilateral mastectomies  Abdominal: Soft. Bowel sounds are normal. She exhibits no distension. There is no abdominal tenderness.  Musculoskeletal:        General: Tenderness and edema present.     Cervical back: Neck supple.  Neurological: She is alert and oriented to  person, place, and time. She has intact cranial nerves (2-12).  Skin: Skin is warm and moist.   CARDIAC DATABASE: EKG: Sep 04, 2022: Sinus rhythm, 79 bpm, left axis, left anterior fascicular block, right bundle branch block, without underlying injury pattern.  Echocardiogram: 09/13/2022  1. Left ventricular ejection fraction, by estimation, is 60 to 65%. The  left ventricle has normal function. The left ventricle has no regional  wall motion abnormalities. There is mild left ventricular hypertrophy.  Left ventricular diastolic parameters  were normal.   2. Grossly RV function appeaars normal. Right ventricular systolic  function was not well visualized. The right ventricular size is normal.   3. The mitral valve is normal in structure. Trivial mitral valve  regurgitation. No evidence of mitral stenosis.   4. Unable to determine AS severity due to no apical windows due to  mastectomy. Recommend TEE. The aortic valve is tricuspid. Unable to  determine aortic valve morphology due to image quality. There is severe  calcifcation  of the aortic valve. There is  moderate thickening of the aortic valve. Aortic valve regurgitation is not  visualized.    Stress Testing: Regadenoson Nuclear stress test 09/12/2022: There is a moderate sized moderate reversible defect in the lateral, inferior and infero-apical regions.  Overall LV systolic function is abnormal with regional wall motion abnormalities in the same distribution (RCA) Stress LV EF: 46%.  Nondiagnostic ECG stress. The heart rate response was consistent with Regadenoson.  No previous exam available for comparison. Intermediate risk study.   Heart Catheterization: None  LABORATORY DATA:    Latest Ref Rng & Units 09/04/2022   12:15 PM 10/10/2020   11:20 AM  CBC  WBC 3.4 - 10.8 x10E3/uL  6.7   Hemoglobin 11.1 - 15.9 g/dL 04.5  40.9   Hematocrit 34.0 - 46.6 % 42.6  45.0   Platelets 150 - 450 x10E3/uL  183        Latest Ref Rng &  Units 09/04/2022   12:15 PM 10/10/2020   11:20 AM 09/28/2007    3:30 PM  CMP  Glucose 70 - 99 mg/dL 811  914    BUN 8 - 27 mg/dL 13  13  12  RESULT CALLED TO, READ BACK BY AND VERIFIED WITH: CAROL WOMBLE @1619  BY TRANSOU,P.   Creatinine 0.57 - 1.00 mg/dL 7.82  9.56  .6 RESULT CALLED TO, READ BACK BY AND VERIFIED WITH: CAROL WOMBLE @1619  BY TRANSOU,P.   Sodium 134 - 144 mmol/L 142  137    Potassium 3.5 - 5.2 mmol/L 4.2  4.2    Chloride 96 - 106 mmol/L 99  97    CO2 20 - 29 mmol/L 26  25    Calcium 8.7 - 10.3 mg/dL 9.7  9.8    Total Protein 6.0 - 8.5 g/dL  6.9    Total Bilirubin 0.0 - 1.2 mg/dL  0.4    Alkaline Phos 44 - 121 IU/L  74    AST 0 - 40 IU/L  44    ALT 0 - 32 IU/L  28      Lipid Panel  No results found for: "CHOL", "TRIG", "HDL", "CHOLHDL", "VLDL", "LDLCALC", "LDLDIRECT", "LABVLDL"  No components found for: "NTPROBNP" No results for input(s): "PROBNP" in the last 8760 hours. No results for input(s): "TSH" in the last 8760 hours.  BMP Recent Labs    09/04/22 1215  NA 142  K 4.2  CL 99  CO2 26  GLUCOSE 146*  BUN 13  CREATININE 0.66  CALCIUM 9.7    HEMOGLOBIN A1C No results found for: "HGBA1C", "MPG"  External Labs: Collected: February 03, 2022. BUN 14, creatinine 0.59. Sodium 139, potassium 3.9, chloride 99, bicarb 33. Total cholesterol 194, triglycerides 123, HDL 54, calculated LDL 118, non-HDL 140   IMPRESSION:    ICD-10-CM   1. Abnormal stress test  R94.39 CBC    CMP14+EGFR    2. Shortness of breath  R06.02     3. Murmur  R01.1     4. Benign hypertension  I10        RECOMMENDATIONS: [name] is a 77 y.o. Caucasian female whose past medical history and cardiac risk factors include: HTN, former smoker, hx breast cancer x2 (status postmastectomy, radiation, chemotherapy), history of ventricular tachycardia.  Patient was referred to the practice for evaluation of shortness of breath.  Her dyspnea is occurring on a daily basis and affecting her  day-to-day activities.  Based on physical examination findings and clinical suspicion for valvular  heart disease was high and therefore echocardiography was recommended at the last office visit.  The echo images were suboptimal due to poor acoustic windows secondary to prior mastectomies/radiation and chemotherapy.  And therefore the aortic valve was not well-visualized and hemodynamics were not measured.  Transesophageal echocardiogram is recommended.  We discussed the risks, benefits, limitations, and benefits with regards to transesophageal echocardiogram at today's office visit. After careful review of history and examination, the risks, benefits of transesophageal echocardiogram, and alternatives have been explained to the patient. Complications include but not limited to esophageal perforation (rare), gastrointestinal bleeding (rare), cardiac arrhythmia which can include cardiac arrest and death (rare), pharyngeal irritation / discomfort with swallowing / hematoma, methemoglobinemia, bronchospasm, transient hypoxia, nonsustained ventricular tachycardia, transient atrial fibrillation, minimal hemoptysis, vomiting, hypotension, respiratory compromise, reaction to medications, unavoidable damage to teeth and gums, aspiration pneumonia  were reviewed with the patient.  Patient voices understands, provides verbal feedback, questions answered, and patient wishes to proceed with the procedure.  In addition, also recommended ischemic workup to evaluate for reversible ischemia.  She underwent stress test which illustrates reversible perfusion defect in the inferior inferolateral segments which raises the suspicion for obstructive disease.  Given the reversibility on myocardial perfusion imaging and dyspnea recommend left and right heart catheterization with possible intervention to evaluate for obstructive disease and hemodynamics respectively.  The procedure of left and right heart catheterization with  possible intervention was explained to the patient in detail.  The indication, alternatives, risks and benefits were reviewed.  Complications include but not limited to bleeding, infection, vascular injury, stroke, myocardial infarction, arrhythmia (requiring medical or cardiopulmonary resuscitation), kidney injury (requiring short-term or long-term hemodialysis), radiation-related injury in the case of prolonged fluoroscopy use, emergent cardiac surgery, temporary or permanent pacemaker, and death. The patient understands the risks of serious complication is 1-2 in 1000 with diagnostic cardiac cath and 1-2% or less with angioplasty/stenting. The patient  voices understanding and provides verbal feedback her questions and concerns are addressed to her satisfaction and patient wishes to proceed with coronary angiography with possible PCI.  Recommend CBC and CMP prior to heart catheterization.  With regards to access during left and right heart catheterization recommend either left arm or groin.  She does not use her right arm for blood pressures with blood draws.  Further recommendations to follow.  FINAL MEDICATION LIST END OF ENCOUNTER: No orders of the defined types were placed in this encounter.   There are no discontinued medications.    Current Outpatient Medications:    B Complex Vitamins (B COMPLEX PO), Take 1 capsule by mouth., Disp: , Rfl:    lisinopril-hydrochlorothiazide (ZESTORETIC) 20-12.5 MG tablet, Take 1 tablet by mouth daily., Disp: , Rfl:    Multiple Vitamin (MULTIVITAMINS PO), 1 tablet, Disp: , Rfl:    Naproxen Sodium (ALEVE PO), Take 1 tablet by mouth 2 (two) times daily., Disp: , Rfl:    pantoprazole (PROTONIX) 40 MG tablet, Take 40 mg by mouth daily., Disp: , Rfl:    Specialty Vitamins Products (COLLAGEN ULTRA) CAPS, See admin instructions., Disp: , Rfl:    torsemide (DEMADEX) 10 MG tablet, Take 1 tablet (10 mg total) by mouth every morning., Disp: 30 tablet, Rfl: 0    VITAMIN D PO, Take 1,000 Units by mouth in the morning and at bedtime., Disp: , Rfl:    albuterol (VENTOLIN HFA) 108 (90 Base) MCG/ACT inhaler, Inhale 2 puffs into the lungs. (Patient not taking: Reported on 09/25/2022), Disp: , Rfl:   Orders  Placed This Encounter  Procedures   CBC   CMP14+EGFR    There are no Patient Instructions on file for this visit.   --Continue cardiac medications as reconciled in final medication list. --Return in about 4 weeks (around 10/23/2022) for Follow up transesophageal echocardiogram and left and right heart catheterization. or sooner if needed. --Continue follow-up with your primary care physician regarding the management of your other chronic comorbid conditions.  Patient's questions and concerns were addressed to her satisfaction. She voices understanding of the instructions provided during this encounter.   This note was created using a voice recognition software as a result there may be grammatical errors inadvertently enclosed that do not reflect the nature of this encounter. Every attempt is made to correct such errors.  Tessa Lerner, [place], [facility]  Pager:  [phone] Office: [phone]

## 2022-10-02 DIAGNOSIS — R350 Frequency of micturition: Secondary | ICD-10-CM | POA: Diagnosis not present

## 2022-10-06 ENCOUNTER — Ambulatory Visit: Payer: Medicare HMO | Admitting: Cardiology

## 2022-10-06 ENCOUNTER — Telehealth: Payer: Self-pay

## 2022-10-06 NOTE — Telephone Encounter (Signed)
Does she need to stop Torsemide before upcoming procedures on 10/14/22? She has no refills left

## 2022-10-08 ENCOUNTER — Other Ambulatory Visit: Payer: Self-pay

## 2022-10-08 DIAGNOSIS — R0602 Shortness of breath: Secondary | ICD-10-CM

## 2022-10-08 MED ORDER — TORSEMIDE 10 MG PO TABS: 10.0000 mg | ORAL_TABLET | Freq: Every morning | ORAL | 3 refills | Status: DC

## 2022-10-08 NOTE — Telephone Encounter (Signed)
No, she can continue torsemide.   Marisella Puccio [place], DO, [facility]

## 2022-10-09 DIAGNOSIS — N3946 Mixed incontinence: Secondary | ICD-10-CM | POA: Diagnosis not present

## 2022-10-09 DIAGNOSIS — Z9481 Bone marrow transplant status: Secondary | ICD-10-CM | POA: Diagnosis not present

## 2022-10-09 DIAGNOSIS — E119 Type 2 diabetes mellitus without complications: Secondary | ICD-10-CM | POA: Diagnosis not present

## 2022-10-09 DIAGNOSIS — R350 Frequency of micturition: Secondary | ICD-10-CM | POA: Diagnosis not present

## 2022-10-09 DIAGNOSIS — E1159 Type 2 diabetes mellitus with other circulatory complications: Secondary | ICD-10-CM | POA: Diagnosis not present

## 2022-10-09 DIAGNOSIS — R9439 Abnormal result of other cardiovascular function study: Secondary | ICD-10-CM | POA: Diagnosis not present

## 2022-10-09 NOTE — Telephone Encounter (Signed)
Should she stop Ozempic before surgery? She was advised by the Pre-op nurse that called her to stop it but said nurse said to confirm with you. Also can she have labs drawn today instead of tomorrow? Pre-op nurse said tomorrow, she wants to confirm this with you.

## 2022-10-09 NOTE — Telephone Encounter (Signed)
Yes, hold as advised prior to cath and TEE.   Shawnmichael Parenteau [place], DO, [facility]

## 2022-10-10 LAB — CMP14+EGFR
ALT: 30 IU/L (ref 0–32)
AST: 48 IU/L — ABNORMAL HIGH (ref 0–40)
Albumin: 4.1 g/dL (ref 3.8–4.8)
Alkaline Phosphatase: 62 IU/L (ref 44–121)
BUN/Creatinine Ratio: 22 (ref 12–28)
BUN: 16 mg/dL (ref 8–27)
Bilirubin Total: 0.5 mg/dL (ref 0.0–1.2)
CO2: 28 mmol/L (ref 20–29)
Calcium: 9.7 mg/dL (ref 8.7–10.3)
Chloride: 98 mmol/L (ref 96–106)
Creatinine, Ser: 0.73 mg/dL (ref 0.57–1.00)
Globulin, Total: 2.2 g/dL (ref 1.5–4.5)
Glucose: 173 mg/dL — ABNORMAL HIGH (ref 70–99)
Potassium: 3.7 mmol/L (ref 3.5–5.2)
Sodium: 143 mmol/L (ref 134–144)
Total Protein: 6.3 g/dL (ref 6.0–8.5)
eGFR: 85 mL/min/{1.73_m2} (ref >59)

## 2022-10-10 LAB — CBC
Hematocrit: 42.5 % (ref 34.0–46.6)
Hemoglobin: 13.9 g/dL (ref 11.1–15.9)
MCH: 27.3 pg (ref 26.6–33.0)
MCHC: 32.7 g/dL (ref 31.5–35.7)
MCV: 83 fL (ref 79–97)
Platelets: 175 10*3/uL (ref 150–450)
RBC: 5.1 x10E6/uL (ref 3.77–5.28)
RDW: 14.6 % (ref 11.7–15.4)
WBC: 6.6 10*3/uL (ref 3.4–10.8)

## 2022-10-13 NOTE — Anesthesia Preprocedure Evaluation (Signed)
Anesthesia Evaluation  Patient identified by MRN, date of birth, ID band Patient awake    Reviewed: Allergy & Precautions, NPO status , Patient's Chart, lab work & pertinent test results  Airway Mallampati: II       Dental no notable dental hx.    Pulmonary former smoker   Pulmonary exam normal        Cardiovascular hypertension, Pt. on medications + Valvular Problems/Murmurs AS  Rhythm:Regular Rate:Normal + Systolic murmurs ECHO:  1. Left ventricular ejection fraction, by estimation, is 60 to 65%. The  left ventricle has normal function. The left ventricle has no regional  wall motion abnormalities. There is mild left ventricular hypertrophy.  Left ventricular diastolic parameters  were normal.   2. Grossly RV function appeaars normal. Right ventricular systolic  function was not well visualized. The right ventricular size is normal.   3. The mitral valve is normal in structure. Trivial mitral valve  regurgitation. No evidence of mitral stenosis.   4. Unable to determine AS severity due to no apical windows due to  mastectomy. Recommend TEE. The aortic valve is tricuspid. Unable to  determine aortic valve morphology due to image quality. There is severe  calcifcation of the aortic valve. There is  moderate thickening of the aortic valve. Aortic valve regurgitation is not  visualized.     Neuro/Psych negative neurological ROS  negative psych ROS   GI/Hepatic Neg liver ROS,GERD  Medicated,,  Endo/Other  negative endocrine ROS    Renal/GU negative Renal ROS     Musculoskeletal negative musculoskeletal ROS (+)    Abdominal Normal abdominal exam  (+)   Peds  Hematology negative hematology ROS (+)   Anesthesia Other Findings   Reproductive/Obstetrics                             Anesthesia Physical Anesthesia Plan  ASA: 4  Anesthesia Plan: MAC   Post-op Pain Management:     Induction: Intravenous  PONV Risk Score and Plan: 2 and Propofol infusion and Treatment may vary due to age or medical condition  Airway Management Planned: Simple Face Mask and Nasal Cannula  Additional Equipment: None  Intra-op Plan:   Post-operative Plan:   Informed Consent: I have reviewed the patients History and Physical, chart, labs and discussed the procedure including the risks, benefits and alternatives for the proposed anesthesia with the patient or authorized representative who has indicated his/her understanding and acceptance.     Dental advisory given  Plan Discussed with: CRNA  Anesthesia Plan Comments:        Anesthesia Quick Evaluation

## 2022-10-13 NOTE — Progress Notes (Signed)
Spoke to pt and instructed them to come at 0700 and to be NPO after 0000. Confirmed no doses of ozempic.   Confirmed that pt will have a ride home and someone to stay with them for 24 hours after the procedure.

## 2022-10-14 ENCOUNTER — Encounter (HOSPITAL_COMMUNITY)
Admission: RE | Disposition: A | Discharge status: Home / Self Care | Payer: Self-pay | Source: Home / Self Care | Attending: Cardiology

## 2022-10-14 ENCOUNTER — Ambulatory Visit (HOSPITAL_COMMUNITY): Payer: Medicare HMO | Admitting: Anesthesiology

## 2022-10-14 ENCOUNTER — Ambulatory Visit (HOSPITAL_COMMUNITY): Payer: Medicare HMO

## 2022-10-14 ENCOUNTER — Encounter (HOSPITAL_COMMUNITY): Payer: Self-pay | Admitting: Cardiology

## 2022-10-14 ENCOUNTER — Ambulatory Visit (HOSPITAL_COMMUNITY)
Admission: RE | Admit: 2022-10-14 | Discharge: 2022-10-14 | Disposition: A | Discharge status: Home / Self Care | Payer: Medicare HMO | Attending: Cardiology | Admitting: Cardiology

## 2022-10-14 ENCOUNTER — Ambulatory Visit (HOSPITAL_BASED_OUTPATIENT_CLINIC_OR_DEPARTMENT_OTHER): Payer: Medicare HMO | Admitting: Anesthesiology

## 2022-10-14 ENCOUNTER — Other Ambulatory Visit: Payer: Self-pay

## 2022-10-14 DIAGNOSIS — I340 Nonrheumatic mitral (valve) insufficiency: Secondary | ICD-10-CM | POA: Diagnosis not present

## 2022-10-14 DIAGNOSIS — R0602 Shortness of breath: Secondary | ICD-10-CM | POA: Diagnosis not present

## 2022-10-14 DIAGNOSIS — Z87891 Personal history of nicotine dependence: Secondary | ICD-10-CM | POA: Diagnosis not present

## 2022-10-14 DIAGNOSIS — I472 Ventricular tachycardia, unspecified: Secondary | ICD-10-CM | POA: Insufficient documentation

## 2022-10-14 DIAGNOSIS — I350 Nonrheumatic aortic (valve) stenosis: Secondary | ICD-10-CM

## 2022-10-14 DIAGNOSIS — R0609 Other forms of dyspnea: Secondary | ICD-10-CM | POA: Diagnosis not present

## 2022-10-14 DIAGNOSIS — R011 Cardiac murmur, unspecified: Secondary | ICD-10-CM | POA: Diagnosis not present

## 2022-10-14 DIAGNOSIS — Z923 Personal history of irradiation: Secondary | ICD-10-CM | POA: Insufficient documentation

## 2022-10-14 DIAGNOSIS — I272 Pulmonary hypertension, unspecified: Secondary | ICD-10-CM | POA: Diagnosis not present

## 2022-10-14 DIAGNOSIS — Z853 Personal history of malignant neoplasm of breast: Secondary | ICD-10-CM | POA: Diagnosis not present

## 2022-10-14 DIAGNOSIS — I700 Atherosclerosis of aorta: Secondary | ICD-10-CM | POA: Diagnosis not present

## 2022-10-14 DIAGNOSIS — I083 Combined rheumatic disorders of mitral, aortic and tricuspid valves: Secondary | ICD-10-CM | POA: Diagnosis not present

## 2022-10-14 DIAGNOSIS — I10 Essential (primary) hypertension: Secondary | ICD-10-CM

## 2022-10-14 HISTORY — DX: Type 2 diabetes mellitus without complications: E11.9

## 2022-10-14 HISTORY — PX: RIGHT/LEFT HEART CATH AND CORONARY ANGIOGRAPHY: CATH118266

## 2022-10-14 HISTORY — PX: TEE WITHOUT CARDIOVERSION: SHX5443

## 2022-10-14 LAB — POCT I-STAT 7, (LYTES, BLD GAS, ICA,H+H)
Acid-Base Excess: 2 mmol/L (ref 0.0–2.0)
Bicarbonate: 28.2 mmol/L — ABNORMAL HIGH (ref 20.0–28.0)
Calcium, Ion: 1.11 mmol/L — ABNORMAL LOW (ref 1.15–1.40)
HCT: 39 % (ref 36.0–46.0)
Hemoglobin: 13.3 g/dL (ref 12.0–15.0)
O2 Saturation: 93 %
Potassium: 3.6 mmol/L (ref 3.5–5.1)
Sodium: 138 mmol/L (ref 135–145)
TCO2: 30 mmol/L (ref 22–32)
pCO2 arterial: 47.4 mmHg (ref 32–48)
pH, Arterial: 7.382 (ref 7.35–7.45)
pO2, Arterial: 70 mmHg — ABNORMAL LOW (ref 83–108)

## 2022-10-14 LAB — ECHO TEE
AR max vel: 0.79 cm2
AV Area VTI: 0.81 cm2
AV Area mean vel: 0.72 cm2
AV Mean grad: 31 mmHg
AV Peak grad: 51 mmHg
Ao pk vel: 3.57 m/s

## 2022-10-14 LAB — POCT I-STAT EG7
Acid-Base Excess: 3 mmol/L — ABNORMAL HIGH (ref 0.0–2.0)
Bicarbonate: 29.7 mmol/L — ABNORMAL HIGH (ref 20.0–28.0)
Calcium, Ion: 1.15 mmol/L (ref 1.15–1.40)
HCT: 41 % (ref 36.0–46.0)
Hemoglobin: 13.9 g/dL (ref 12.0–15.0)
O2 Saturation: 66 %
Potassium: 3.6 mmol/L (ref 3.5–5.1)
Sodium: 138 mmol/L (ref 135–145)
TCO2: 31 mmol/L (ref 22–32)
pCO2, Ven: 52.3 mmHg (ref 44–60)
pH, Ven: 7.363 (ref 7.25–7.43)
pO2, Ven: 36 mmHg (ref 32–45)

## 2022-10-14 LAB — GLUCOSE, CAPILLARY
Glucose-Capillary: 211 mg/dL — ABNORMAL HIGH (ref 70–99)
Glucose-Capillary: 174 mg/dL — ABNORMAL HIGH (ref 70–99)

## 2022-10-14 SURGERY — RIGHT/LEFT HEART CATH AND CORONARY ANGIOGRAPHY
Anesthesia: LOCAL

## 2022-10-14 SURGERY — ECHOCARDIOGRAM, TRANSESOPHAGEAL
Anesthesia: Monitor Anesthesia Care

## 2022-10-14 MED ORDER — FENTANYL CITRATE (PF) 100 MCG/2ML IJ SOLN
25.0000 ug | Freq: Once | INTRAMUSCULAR | Status: AC
  Administered 2022-10-14: 25 ug via INTRAVENOUS

## 2022-10-14 MED ORDER — HEPARIN (PORCINE) IN NACL 1000-0.9 UT/500ML-% IV SOLN
INTRAVENOUS | Status: DC | PRN
  Administered 2022-10-14 (×2): 500 mL

## 2022-10-14 MED ORDER — FENTANYL CITRATE (PF) 100 MCG/2ML IJ SOLN
INTRAMUSCULAR | Status: AC
  Filled 2022-10-14: qty 2

## 2022-10-14 MED ORDER — ASPIRIN 81 MG PO CHEW
81.0000 mg | CHEWABLE_TABLET | ORAL | Status: DC
Start: 2022-10-15 — End: 2022-10-14

## 2022-10-14 MED ORDER — SODIUM CHLORIDE 0.9 % IV SOLN
250.0000 mL | INTRAVENOUS | Status: DC | PRN
  Administered 2022-10-14: 250 mL via INTRAVENOUS

## 2022-10-14 MED ORDER — ASPIRIN 81 MG PO CHEW
81.0000 mg | CHEWABLE_TABLET | ORAL | Status: AC
  Administered 2022-10-14: 81 mg via ORAL

## 2022-10-14 MED ORDER — SODIUM CHLORIDE 0.9% FLUSH: 3.0000 mL | Freq: Two times a day (BID) | INTRAVENOUS | Status: DC

## 2022-10-14 MED ORDER — HYDRALAZINE HCL 20 MG/ML IJ SOLN
INTRAMUSCULAR | Status: AC
  Filled 2022-10-14: qty 1

## 2022-10-14 MED ORDER — LIDOCAINE HCL (PF) 1 % IJ SOLN
INTRAMUSCULAR | Status: AC
  Filled 2022-10-14: qty 30

## 2022-10-14 MED ORDER — MIDAZOLAM HCL 2 MG/2ML IJ SOLN
INTRAMUSCULAR | Status: AC
  Filled 2022-10-14: qty 2

## 2022-10-14 MED ORDER — ONDANSETRON HCL 4 MG/2ML IJ SOLN: 4.0000 mg | Freq: Four times a day (QID) | INTRAMUSCULAR | Status: DC | PRN

## 2022-10-14 MED ORDER — MIDAZOLAM HCL 2 MG/2ML IJ SOLN
INTRAMUSCULAR | Status: DC | PRN
  Administered 2022-10-14: 1 mg via INTRAVENOUS

## 2022-10-14 MED ORDER — SODIUM CHLORIDE 0.9 % WEIGHT BASED INFUSION: 1.0000 mL/kg/h | INTRAVENOUS | Status: DC

## 2022-10-14 MED ORDER — PROPOFOL 500 MG/50ML IV EMUL
INTRAVENOUS | Status: DC | PRN
  Administered 2022-10-14: 150 ug/kg/min via INTRAVENOUS

## 2022-10-14 MED ORDER — LABETALOL HCL 5 MG/ML IV SOLN: 10.0000 mg | INTRAVENOUS | Status: AC | PRN

## 2022-10-14 MED ORDER — FENTANYL CITRATE (PF) 100 MCG/2ML IJ SOLN
INTRAMUSCULAR | Status: DC | PRN
  Administered 2022-10-14: 25 ug via INTRAVENOUS

## 2022-10-14 MED ORDER — SODIUM CHLORIDE 0.9 % IV SOLN
250.0000 mL | INTRAVENOUS | Status: DC | PRN
Start: 2022-10-14 — End: 2022-10-14

## 2022-10-14 MED ORDER — SODIUM CHLORIDE 0.9% FLUSH: 3.0000 mL | INTRAVENOUS | Status: DC | PRN

## 2022-10-14 MED ORDER — SODIUM CHLORIDE 0.9 % IV SOLN: 250.0000 mL | INTRAVENOUS | Status: DC | PRN

## 2022-10-14 MED ORDER — ACETAMINOPHEN 325 MG PO TABS: 650.0000 mg | ORAL_TABLET | ORAL | Status: DC | PRN

## 2022-10-14 MED ORDER — HYDRALAZINE HCL 20 MG/ML IJ SOLN: 10.0000 mg | INTRAMUSCULAR | Status: AC | PRN

## 2022-10-14 MED ORDER — SODIUM CHLORIDE 0.9 % IV SOLN: INTRAVENOUS | Status: AC

## 2022-10-14 MED ORDER — LIDOCAINE HCL (PF) 1 % IJ SOLN
INTRAMUSCULAR | Status: DC | PRN
  Administered 2022-10-14: 10 mL via INTRADERMAL

## 2022-10-14 MED ORDER — IOHEXOL 350 MG/ML SOLN
INTRAVENOUS | Status: DC | PRN
  Administered 2022-10-14: 45 mL

## 2022-10-14 MED ORDER — SODIUM CHLORIDE 0.9 % WEIGHT BASED INFUSION: 3.0000 mL/kg/h | INTRAVENOUS | Status: AC

## 2022-10-14 MED ORDER — PROPOFOL 10 MG/ML IV BOLUS
INTRAVENOUS | Status: DC | PRN
  Administered 2022-10-14: 40 mg via INTRAVENOUS

## 2022-10-14 MED ORDER — BUTAMBEN-TETRACAINE-BENZOCAINE 2-2-14 % EX AERO
INHALATION_SPRAY | CUTANEOUS | Status: AC
  Filled 2022-10-14: qty 20

## 2022-10-14 SURGICAL SUPPLY — 16 items
CATH INFINITI 5FR MULTPACK ANG (CATHETERS) IMPLANT
CATH LANGSTON DUAL LUM PIG 6FR (CATHETERS) IMPLANT
CATH SWAN GANZ 7F STRAIGHT (CATHETERS) IMPLANT
KIT HEART LEFT (KITS) ×1 IMPLANT
KIT MICROPUNCTURE NIT STIFF (SHEATH) IMPLANT
PACK CARDIAC CATHETERIZATION (CUSTOM PROCEDURE TRAY) ×1 IMPLANT
SHEATH PINNACLE 5F 10CM (SHEATH) IMPLANT
SHEATH PINNACLE 6F 10CM (SHEATH) IMPLANT
SHEATH PINNACLE 7F 10CM (SHEATH) IMPLANT
SHEATH PROBE COVER 6X72 (BAG) IMPLANT
TRANSDUCER W/STOPCOCK (MISCELLANEOUS) ×1 IMPLANT
TUBING CIL FLEX 10 FLL-RA (TUBING) ×1 IMPLANT
WIRE EMERALD 3MM-J .025X260CM (WIRE) IMPLANT
WIRE EMERALD 3MM-J .035X150CM (WIRE) IMPLANT
WIRE EMERALD 3MM-J .035X260CM (WIRE) IMPLANT
WIRE MICROINTRODUCER 60CM (WIRE) IMPLANT

## 2022-10-14 NOTE — Transfer of Care (Signed)
Immediate Anesthesia Transfer of Care Note  Patient: [name]  Procedure(s) Performed: TRANSESOPHAGEAL ECHOCARDIOGRAM  Patient Location: Cath Lab  Anesthesia Type:MAC  Level of Consciousness: awake and alert   Airway & Oxygen Therapy: Patient Spontanous Breathing and Patient connected to nasal cannula oxygen  Post-op Assessment: Report given to RN and Post -op Vital signs reviewed and stable  Post vital signs: Reviewed and stable  Last Vitals:  Vitals Value Taken Time  BP    Temp    Pulse    Resp    SpO2      Last Pain:  Vitals:   10/14/22 0724  TempSrc: Temporal  PainSc: 0-No pain         Complications: No notable events documented.

## 2022-10-14 NOTE — Anesthesia Postprocedure Evaluation (Signed)
Anesthesia Post Note  Patient: [name]  Procedure(s) Performed: TRANSESOPHAGEAL ECHOCARDIOGRAM     Patient location during evaluation: PACU Anesthesia Type: MAC Level of consciousness: awake and alert Pain management: pain level controlled Vital Signs Assessment: post-procedure vital signs reviewed and stable Respiratory status: spontaneous breathing, nonlabored ventilation, respiratory function stable and patient connected to nasal cannula oxygen Cardiovascular status: stable and blood pressure returned to baseline Postop Assessment: no apparent nausea or vomiting Anesthetic complications: no   No notable events documented.  Last Vitals:  Vitals:   10/14/22 1231 10/14/22 1234  BP: (!) 161/68 (!) 151/76  Pulse: 72 72  Resp:    Temp:    SpO2: 94% 94%    Last Pain:  Vitals:   10/14/22 1153  TempSrc:   PainSc: 0-No pain                 Earl Lites P Jovan Schickling

## 2022-10-14 NOTE — Interval H&P Note (Signed)
History and Physical Interval Note:  10/14/2022 9:33 AM  [name]  has presented today for surgery, with the diagnosis of positive stress test.  The various methods of treatment have been discussed with the patient and family. After consideration of risks, benefits and other options for treatment, the patient has consented to  Procedure(s): RIGHT/LEFT HEART CATH AND CORONARY ANGIOGRAPHY (N/A) as a surgical intervention.  The patient's history has been reviewed, patient examined, no change in status, stable for surgery.  I have reviewed the patient's chart and labs.  Questions were answered to the patient's satisfaction.      Claude Waldman J Sriya Kroeze

## 2022-10-14 NOTE — H&P (Signed)
OV 09/25/2022 copied for documentation    ID:  [name], [name] 08/25/1945, MRN [id number]  PCP:  Jessica Rua, MD  Cardiologist:  Jessica Lerner, DO, [facility] (established care Sep 04, 2022)  Date: 09/25/22 Last Office Visit: 09/04/2022  Chief Complaint  Patient presents with   Shortness of Breath   Follow-up    HPI  [name] is a 77 y.o. Caucasian female whose past medical history and cardiovascular risk factors include: HTN, former smoker, hx breast cancer x2 (status postmastectomy, radiation, chemotherapy), history of ventricular tachycardia.  Patient was referred to the practice for evaluation of shortness of breath.  At the last office visit the concern was that she may have underlying valvular heart disease that is contributory to her symptoms.  She was recommended to have an echocardiogram and stress test as her symptoms could be anginal equivalents as well.  She presents today for follow-up.  Since last office visit she continues to have dyspnea on exertion with day-to-day activities.  Echocardiography noted preserved LVEF but due to prior mastectomies/radiation/chemo the aortic valve was not well-visualized on transthoracic study.  Transesophageal echocardiogram was recommended.  Given the degree of dyspnea patient also underwent stress test which notes reversible ischemia in the inferior & inferolateral segments.  ALLERGIES: Allergies  Allergen Reactions   Penicillin G Anaphylaxis    Other Reaction(s): dry mouth  Other Reaction(s): Rash/SOB   Penicillins Rash and Shortness Of Breath    Other Reaction(s): rash/sob   Bee Venom Swelling    Bee stings   Morphine Other (See Comments) and Nausea And Vomiting    MEDICATION LIST PRIOR TO VISIT: Current Meds  Medication Sig   B Complex Vitamins (B COMPLEX PO) Take 1 capsule by mouth.   lisinopril-hydrochlorothiazide (ZESTORETIC) 20-12.5 MG tablet Take 1 tablet by mouth daily.   Multiple Vitamin (MULTIVITAMINS PO) 1  tablet   Naproxen Sodium (ALEVE PO) Take 1 tablet by mouth 2 (two) times daily.   pantoprazole (PROTONIX) 40 MG tablet Take 40 mg by mouth daily.   Specialty Vitamins Products (COLLAGEN ULTRA) CAPS See admin instructions.   torsemide (DEMADEX) 10 MG tablet Take 1 tablet (10 mg total) by mouth every morning.   VITAMIN D PO Take 1,000 Units by mouth in the morning and at bedtime.     PAST MEDICAL HISTORY: Past Medical History:  Diagnosis Date   Allergic rhinitis    Breast cancer, left (HCC) 2007   ductal carcinoma in situ, underwent left mastectomy 07/2005; reconstructive surgery with expander, permanent implant placed 03/2006, hospitalized January 2008 with breast cellulitis.   Breast cancer, right (HCC) 1998   1998; modified radical right mastectomy 1998 for infiltrating ductal adenocarcinoma with 7 of 12 axillary nodes positive. Underwent adjuvant chemotherapy and bone marrow transplant by Dr Greggory Stallion as well as radiation therapy. Also had Tram flap reconstruction.   History of ventricular tachycardia    Hypertension    Vitamin D deficiency     PAST SURGICAL HISTORY: Past Surgical History:  Procedure Laterality Date   biopsy on scalp  2010   breast implant Left 2007   CESAREAN SECTION     childbirth     63, 66, 67, 70   EYE SURGERY  1952   hysterectomy with one ovary/due to fibroids  1987   left mastectomy Left 2007   NASAL SEPTUM SURGERY  1975   PORTACATH PLACEMENT  1998   right mastectomy Right 1998   stem cell infusion  1998   surgery for  crossed eyes     TONSILLECTOMY  1961   tran flap R breast Right 1998   tri-port  1998   TUBAL LIGATION      FAMILY HISTORY: The patient family history includes Arrhythmia in her sister; Blindness in her paternal grandfather; Breast cancer in her paternal aunt; Dementia in her mother; Diabetes in her paternal grandmother; Heart Problems in her maternal grandfather and paternal grandmother; Hypertension in her maternal grandmother and  sister; Prostate cancer in her father.  SOCIAL HISTORY:  The patient  reports that she quit smoking about 26 years ago. Her smoking use included cigarettes. She started smoking about 62 years ago. She smoked an average of 1 pack per day. She has never used smokeless tobacco. She reports current alcohol use. She reports that she does not use drugs.  REVIEW OF SYSTEMS: Review of Systems  Cardiovascular:  Positive for dyspnea on exertion and leg swelling. Negative for chest pain, claudication, irregular heartbeat, near-syncope, orthopnea, palpitations, paroxysmal nocturnal dyspnea and syncope.  Respiratory:  Positive for shortness of breath.   Hematologic/Lymphatic: Negative for bleeding problem.  Musculoskeletal:  Negative for muscle cramps and myalgias.  Neurological:  Negative for dizziness and light-headedness.    PHYSICAL EXAM:    09/25/2022    2:38 PM 09/04/2022   10:15 AM 05/06/2021    2:30 PM  Vitals with BMI  Height 5\' 4"  5\' 4"  5\' 4"   Weight 218 lbs 227 lbs 222 lbs  BMI 37.4 38.95 38.09  Systolic 118 168   Diastolic 72 80   Pulse 83 95     Physical Exam  Constitutional: No distress.  Appears older than stated age, ambulates with a cane,, hemodynamically stable.   Neck: No JVD present.  Cardiovascular: Normal rate, regular rhythm, S1 normal, S2 normal, intact distal pulses and normal pulses. Exam reveals no gallop, no S3 and no S4.  Murmur heard. Harsh systolic murmur is present with a grade of 3/6 at the upper right sternal border radiating to the neck. Pulses:      Carotid pulses are  on the right side with bruit and  on the left side with bruit. Pulmonary/Chest: Effort normal and breath sounds normal. No stridor. She has no wheezes. She has no rales.  Bilateral mastectomies  Abdominal: Soft. Bowel sounds are normal. She exhibits no distension. There is no abdominal tenderness.  Musculoskeletal:        General: Tenderness and edema present.     Cervical back: Neck  supple.  Neurological: She is alert and oriented to person, place, and time. She has intact cranial nerves (2-12).  Skin: Skin is warm and moist.   CARDIAC DATABASE: EKG: Sep 04, 2022: Sinus rhythm, 79 bpm, left axis, left anterior fascicular block, right bundle branch block, without underlying injury pattern.  Echocardiogram: 09/13/2022  1. Left ventricular ejection fraction, by estimation, is 60 to 65%. The  left ventricle has normal function. The left ventricle has no regional  wall motion abnormalities. There is mild left ventricular hypertrophy.  Left ventricular diastolic parameters  were normal.   2. Grossly RV function appeaars normal. Right ventricular systolic  function was not well visualized. The right ventricular size is normal.   3. The mitral valve is normal in structure. Trivial mitral valve  regurgitation. No evidence of mitral stenosis.   4. Unable to determine AS severity due to no apical windows due to  mastectomy. Recommend TEE. The aortic valve is tricuspid. Unable to  determine aortic valve morphology due  to image quality. There is severe  calcifcation of the aortic valve. There is  moderate thickening of the aortic valve. Aortic valve regurgitation is not  visualized.    Stress Testing: Regadenoson Nuclear stress test 09/12/2022: There is a moderate sized moderate reversible defect in the lateral, inferior and infero-apical regions.  Overall LV systolic function is abnormal with regional wall motion abnormalities in the same distribution (RCA) Stress LV EF: 46%.  Nondiagnostic ECG stress. The heart rate response was consistent with Regadenoson.  No previous exam available for comparison. Intermediate risk study.   Heart Catheterization: None  LABORATORY DATA:    Latest Ref Rng & Units 09/04/2022   12:15 PM 10/10/2020   11:20 AM  CBC  WBC 3.4 - 10.8 x10E3/uL  6.7   Hemoglobin 11.1 - 15.9 g/dL 02.7  25.3   Hematocrit 34.0 - 46.6 % 42.6  45.0    Platelets 150 - 450 x10E3/uL  183        Latest Ref Rng & Units 09/04/2022   12:15 PM 10/10/2020   11:20 AM 09/28/2007    3:30 PM  CMP  Glucose 70 - 99 mg/dL 664  403    BUN 8 - 27 mg/dL 13  13  12  RESULT CALLED TO, READ BACK BY AND VERIFIED WITH: CAROL WOMBLE @1619  BY TRANSOU,P.   Creatinine 0.57 - 1.00 mg/dL 4.74  2.59  .6 RESULT CALLED TO, READ BACK BY AND VERIFIED WITH: CAROL WOMBLE @1619  BY TRANSOU,P.   Sodium 134 - 144 mmol/L 142  137    Potassium 3.5 - 5.2 mmol/L 4.2  4.2    Chloride 96 - 106 mmol/L 99  97    CO2 20 - 29 mmol/L 26  25    Calcium 8.7 - 10.3 mg/dL 9.7  9.8    Total Protein 6.0 - 8.5 g/dL  6.9    Total Bilirubin 0.0 - 1.2 mg/dL  0.4    Alkaline Phos 44 - 121 IU/L  74    AST 0 - 40 IU/L  44    ALT 0 - 32 IU/L  28      Lipid Panel  No results found for: "CHOL", "TRIG", "HDL", "CHOLHDL", "VLDL", "LDLCALC", "LDLDIRECT", "LABVLDL"  No components found for: "NTPROBNP" No results for input(s): "PROBNP" in the last 8760 hours. No results for input(s): "TSH" in the last 8760 hours.  BMP Recent Labs    09/04/22 1215  NA 142  K 4.2  CL 99  CO2 26  GLUCOSE 146*  BUN 13  CREATININE 0.66  CALCIUM 9.7    HEMOGLOBIN A1C No results found for: "HGBA1C", "MPG"  External Labs: Collected: February 03, 2022. BUN 14, creatinine 0.59. Sodium 139, potassium 3.9, chloride 99, bicarb 33. Total cholesterol 194, triglycerides 123, HDL 54, calculated LDL 118, non-HDL 140   IMPRESSION:    ICD-10-CM   1. Abnormal stress test  R94.39 CBC    CMP14+EGFR    2. Shortness of breath  R06.02     3. Murmur  R01.1     4. Benign hypertension  I10        RECOMMENDATIONS: JESMINE DURRAH is a 77 y.o. Caucasian female whose past medical history and cardiac risk factors include: HTN, former smoker, hx breast cancer x2 (status postmastectomy, radiation, chemotherapy), history of ventricular tachycardia.  Patient was referred to the practice for evaluation of shortness of  breath.  Her dyspnea is occurring on a daily basis and affecting her day-to-day activities.  Based on  physical examination findings and clinical suspicion for valvular heart disease was high and therefore echocardiography was recommended at the last office visit.  The echo images were suboptimal due to poor acoustic windows secondary to prior mastectomies/radiation and chemotherapy.  And therefore the aortic valve was not well-visualized and hemodynamics were not measured.  Transesophageal echocardiogram is recommended.  We discussed the risks, benefits, limitations, and benefits with regards to transesophageal echocardiogram at today's office visit. After careful review of history and examination, the risks, benefits of transesophageal echocardiogram, and alternatives have been explained to the patient. Complications include but not limited to esophageal perforation (rare), gastrointestinal bleeding (rare), cardiac arrhythmia which can include cardiac arrest and death (rare), pharyngeal irritation / discomfort with swallowing / hematoma, methemoglobinemia, bronchospasm, transient hypoxia, nonsustained ventricular tachycardia, transient atrial fibrillation, minimal hemoptysis, vomiting, hypotension, respiratory compromise, reaction to medications, unavoidable damage to teeth and gums, aspiration pneumonia  were reviewed with the patient.  Patient voices understands, provides verbal feedback, questions answered, and patient wishes to proceed with the procedure.  In addition, also recommended ischemic workup to evaluate for reversible ischemia.  She underwent stress test which illustrates reversible perfusion defect in the inferior inferolateral segments which raises the suspicion for obstructive disease.  Given the reversibility on myocardial perfusion imaging and dyspnea recommend left and right heart catheterization with possible intervention to evaluate for obstructive disease and hemodynamics  respectively.  The procedure of left and right heart catheterization with possible intervention was explained to the patient in detail.  The indication, alternatives, risks and benefits were reviewed.  Complications include but not limited to bleeding, infection, vascular injury, stroke, myocardial infarction, arrhythmia (requiring medical or cardiopulmonary resuscitation), kidney injury (requiring short-term or long-term hemodialysis), radiation-related injury in the case of prolonged fluoroscopy use, emergent cardiac surgery, temporary or permanent pacemaker, and death. The patient understands the risks of serious complication is 1-2 in 1000 with diagnostic cardiac cath and 1-2% or less with angioplasty/stenting. The patient  voices understanding and provides verbal feedback her questions and concerns are addressed to her satisfaction and patient wishes to proceed with coronary angiography with possible PCI.  Recommend CBC and CMP prior to heart catheterization.  With regards to access during left and right heart catheterization recommend either left arm or groin.  She does not use her right arm for blood pressures with blood draws.  Further recommendations to follow.  FINAL MEDICATION LIST END OF ENCOUNTER: No orders of the defined types were placed in this encounter.   There are no discontinued medications.    Current Outpatient Medications:    B Complex Vitamins (B COMPLEX PO), Take 1 capsule by mouth., Disp: , Rfl:    lisinopril-hydrochlorothiazide (ZESTORETIC) 20-12.5 MG tablet, Take 1 tablet by mouth daily., Disp: , Rfl:    Multiple Vitamin (MULTIVITAMINS PO), 1 tablet, Disp: , Rfl:    Naproxen Sodium (ALEVE PO), Take 1 tablet by mouth 2 (two) times daily., Disp: , Rfl:    pantoprazole (PROTONIX) 40 MG tablet, Take 40 mg by mouth daily., Disp: , Rfl:    Specialty Vitamins Products (COLLAGEN ULTRA) CAPS, See admin instructions., Disp: , Rfl:    torsemide (DEMADEX) 10 MG tablet, Take 1  tablet (10 mg total) by mouth every morning., Disp: 30 tablet, Rfl: 0   VITAMIN D PO, Take 1,000 Units by mouth in the morning and at bedtime., Disp: , Rfl:    albuterol (VENTOLIN HFA) 108 (90 Base) MCG/ACT inhaler, Inhale 2 puffs into the lungs. (Patient not taking: Reported  on 09/25/2022), Disp: , Rfl:   Orders Placed This Encounter  Procedures   CBC   CMP14+EGFR    There are no Patient Instructions on file for this visit.   --Continue cardiac medications as reconciled in final medication list. --Return in about 4 weeks (around 10/23/2022) for Follow up transesophageal echocardiogram and left and right heart catheterization. or sooner if needed. --Continue follow-up with your primary care physician regarding the management of your other chronic comorbid conditions.  Patient's questions and concerns were addressed to her satisfaction. She voices understanding of the instructions provided during this encounter.   This note was created using a voice recognition software as a result there may be grammatical errors inadvertently enclosed that do not reflect the nature of this encounter. Every attempt is made to correct such errors.  [name], [place], [facility]  Pager:  [phone] Office: [phone]

## 2022-10-14 NOTE — CV Procedure (Signed)
TEE: Under deep sedation, TEE was performed without complications: LV: Normal. Normal EF. RV: Normal LA: Normal. Left atrial appendage: Normal without thrombus. Normal function. Inter atrial septum is intact without defect. Double contrast study negative for atrial level shunting. No late appearance of bubbles either. RA: Normal  MV: Normal. Mild to mod MR. TV: Normal. Mild to mod TR AV: Trileaflet. Calcified. Mod to severe AS. No AI. PV: Normal. Trace PI.  Thoracic and ascending aorta: Mild plaque.  Deep sedation protocol was followed. Patient tolerated the procedure well and there was no complication from conscious sedation.    Elder Negus, MD Pager: [phone] Office: [phone]

## 2022-10-14 NOTE — Progress Notes (Signed)
SITE AREA:  right groin/femoral  SITE PRIOR TO REMOVAL:  LEVEL 0  PRESSURE APPLIED FOR: Arterial sheath removed 1st, pressure held for approximately 42-43 minutes, veinous sheath removed 2nd, pressure held for approximately 15 minutes, veinous sheath removed approximately 27 minutes into arterial sheath removal  MANUAL: yes  PATIENT STATUS DURING PULL: stable  POST PULL SITE:  LEVEL 0  POST PULL INSTRUCTIONS GIVEN: yes, drsg x 24 hours, no running, jumping and take it easy on rle, no sitting in water, no hot tubs, bath tubs, or pools x 1 week, shower only, be careful getting into vehicles  POST PULL PULSES PRESENT: bilateral oedal pulses at =2  DRESSING APPLIED: gauze with tegaderm  BEDREST BEGINS @ 1200  COMMENTS: both sheaths removed by Ayesha Rumpf, 2H RN

## 2022-10-14 NOTE — Interval H&P Note (Signed)
History and Physical Interval Note:  10/14/2022 8:05 AM  [name]  has presented today for surgery, with the diagnosis of AORTIC STENOSIS.  The various methods of treatment have been discussed with the patient and family. After consideration of risks, benefits and other options for treatment, the patient has consented to  Procedure(s): TRANSESOPHAGEAL ECHOCARDIOGRAM (N/A) as a surgical intervention.  The patient's history has been reviewed, patient examined, no change in status, stable for surgery.  I have reviewed the patient's chart and labs.  Questions were answered to the patient's satisfaction.      [name]

## 2022-10-14 NOTE — Progress Notes (Signed)
Client up and walked and tolerated well; right groin stable, no bleeding or hematoma 

## 2022-10-15 ENCOUNTER — Encounter (HOSPITAL_COMMUNITY): Payer: Self-pay | Admitting: Cardiology

## 2022-10-28 ENCOUNTER — Encounter: Payer: Self-pay | Admitting: Cardiology

## 2022-10-28 ENCOUNTER — Ambulatory Visit: Payer: Medicare HMO | Admitting: Cardiology

## 2022-10-28 VITALS — BP 126/77 | HR 83 | Resp 16 | Ht 64.0 in | Wt 216.0 lb

## 2022-10-28 DIAGNOSIS — Z9013 Acquired absence of bilateral breasts and nipples: Secondary | ICD-10-CM | POA: Diagnosis not present

## 2022-10-28 DIAGNOSIS — I10 Essential (primary) hypertension: Secondary | ICD-10-CM

## 2022-10-28 DIAGNOSIS — I350 Nonrheumatic aortic (valve) stenosis: Secondary | ICD-10-CM

## 2022-10-28 DIAGNOSIS — R0602 Shortness of breath: Secondary | ICD-10-CM

## 2022-10-28 DIAGNOSIS — R55 Syncope and collapse: Secondary | ICD-10-CM | POA: Diagnosis not present

## 2022-10-28 DIAGNOSIS — Z853 Personal history of malignant neoplasm of breast: Secondary | ICD-10-CM

## 2022-10-28 NOTE — Progress Notes (Signed)
ID:  [name], DOB 06/10/1945, MRN [id number]  PCP:  Joycelyn Rua, MD  Cardiologist:  Tessa Lerner, DO, [facility] (established care Sep 04, 2022)  Date: 10/28/22 Last Office Visit: 09/04/2022  Chief Complaint  Patient presents with   Follow-up    S/p heart cath    HPI  [name] is a 77 y.o. Caucasian female whose past medical history and cardiovascular risk factors include: Aortic stenosis HTN, former smoker, hx breast cancer x2 (status postmastectomy, radiation, chemotherapy), history of ventricular tachycardia.  Patient was referred to the practice for evaluation of shortness of breath.  Her physical examination findings were concerning for undiagnosed aortic stenosis.  She was recommended to undergo transthoracic echo but due to poor acoustic windows secondary to mastectomies/radiation images were suboptimal.  Her stress test as part of her ischemic workup was also concerning for reversible ischemia and therefore at the last office visit the shared decision was to proceed with angiography and transesophageal echocardiogram.  Results reviewed with her in great detail and noted below for further reference.  Based on both transesophageal echo and right heart hemodynamics aortic valve stenosis per gradients is at least moderate and based on valve area aortic stenosis is severe.  No significant obstructive disease based on left heart catheterization.  Patient has diuresed well with medications and blood pressures have also improved.  This has resulted in at least 50% improvement in symptoms.  However she continues to have shortness of breath with activities of daily living and occasionally endorses having near syncope.  ALLERGIES: Allergies  Allergen Reactions   Penicillin G Anaphylaxis    Other Reaction(s): dry mouth  Other Reaction(s): Rash/SOB   Penicillins Rash and Shortness Of Breath    Other Reaction(s): rash/sob   Bee Venom Swelling    Bee stings   Morphine Other  (See Comments) and Nausea And Vomiting    MEDICATION LIST PRIOR TO VISIT: Current Meds  Medication Sig   acetaminophen (TYLENOL) 650 MG CR tablet Take 650-1,300 mg by mouth daily.   albuterol (VENTOLIN HFA) 108 (90 Base) MCG/ACT inhaler Inhale 2 puffs into the lungs every 4 (four) hours as needed for wheezing or shortness of breath.   B Complex Vitamins (B COMPLEX PO) Take 1 capsule by mouth daily.   Cholecalciferol (VITAMIN D3) 125 MCG (5000 UT) TABS Take 5,000 Units by mouth daily.   lisinopril-hydrochlorothiazide (ZESTORETIC) 20-12.5 MG tablet Take 1 tablet by mouth daily.   Magnesium 250 MG TABS Take 250 mg by mouth daily as needed (Cramps).   Multiple Vitamin (MULTIVITAMINS PO) Take 1 tablet by mouth daily.   naproxen sodium (ALEVE) 220 MG tablet Take 220 mg by mouth daily as needed (every other month).   OZEMPIC, 0.25 OR 0.5 MG/DOSE, 2 MG/3ML SOPN Inject into the skin once a week.   pantoprazole (PROTONIX) 40 MG tablet Take 40 mg by mouth daily.   Specialty Vitamins Products (COLLAGEN ULTRA) CAPS See admin instructions.   torsemide (DEMADEX) 10 MG tablet Take 1 tablet (10 mg total) by mouth every morning.   [DISCONTINUED] lisinopril-hydrochlorothiazide (ZESTORETIC) 10-12.5 MG tablet Take 1 tablet by mouth daily.     PAST MEDICAL HISTORY: Past Medical History:  Diagnosis Date   Allergic rhinitis    Breast cancer, left (HCC) 2007   ductal carcinoma in situ, underwent left mastectomy 07/2005; reconstructive surgery with expander, permanent implant placed 03/2006, hospitalized January 2008 with breast cellulitis.   Breast cancer, right (HCC) 1998   1998; modified radical right  mastectomy 1998 for infiltrating ductal adenocarcinoma with 7 of 12 axillary nodes positive. Underwent adjuvant chemotherapy and bone marrow transplant by Dr Greggory Stallion as well as radiation therapy. Also had Tram flap reconstruction.   Diabetes mellitus without complication (HCC)    History of ventricular tachycardia     Hypertension    Vitamin D deficiency     PAST SURGICAL HISTORY: Past Surgical History:  Procedure Laterality Date   biopsy on scalp  2010   breast implant Left 2007   CESAREAN SECTION     childbirth     63, 66, 67, 70   EYE SURGERY  1952   hysterectomy with one ovary/due to fibroids  1987   left mastectomy Left 2007   NASAL SEPTUM SURGERY  1975   PORTACATH PLACEMENT  1998   right mastectomy Right 1998   RIGHT/LEFT HEART CATH AND CORONARY ANGIOGRAPHY N/A 10/14/2022   Procedure: RIGHT/LEFT HEART CATH AND CORONARY ANGIOGRAPHY;  Surgeon: Elder Negus, MD;  Location: [facility] INVASIVE CV LAB;  Service: Cardiovascular;  Laterality: N/A;   stem cell infusion  1998   surgery for crossed eyes     TEE WITHOUT CARDIOVERSION N/A 10/14/2022   Procedure: TRANSESOPHAGEAL ECHOCARDIOGRAM;  Surgeon: Elder Negus, MD;  Location: [facility] INVASIVE CV LAB;  Service: Cardiovascular;  Laterality: N/A;   TONSILLECTOMY  1961   tran flap R breast Right 1998   tri-port  1998   TUBAL LIGATION      FAMILY HISTORY: The patient family history includes Arrhythmia in her sister; Blindness in her paternal grandfather; Breast cancer in her paternal aunt; Dementia in her mother; Diabetes in her paternal grandmother; Heart Problems in her maternal grandfather and paternal grandmother; Hypertension in her maternal grandmother and sister; Prostate cancer in her father.  SOCIAL HISTORY:  The patient  reports that she quit smoking about 26 years ago. Her smoking use included cigarettes. She started smoking about 62 years ago. She has a 36 pack-year smoking history. She has never used smokeless tobacco. She reports current alcohol use. She reports that she does not use drugs.  REVIEW OF SYSTEMS: Review of Systems  Constitutional: Positive for malaise/fatigue.  Cardiovascular:  Positive for dyspnea on exertion and near-syncope. Negative for chest pain, claudication, irregular heartbeat, leg swelling, orthopnea,  palpitations, paroxysmal nocturnal dyspnea and syncope.  Respiratory:  Positive for shortness of breath.   Hematologic/Lymphatic: Negative for bleeding problem.  Musculoskeletal:  Negative for muscle cramps and myalgias.  Neurological:  Negative for dizziness and light-headedness.    PHYSICAL EXAM:    10/28/2022   11:22 AM 10/14/2022    3:16 PM 10/14/2022    2:16 PM  Vitals with BMI  Height 5\' 4"     Weight 216 lbs    BMI 37.06    Systolic 126 144 161  Diastolic 77 78 64  Pulse 83 74 73    Physical Exam  Constitutional: No distress.  Appears older than stated age, ambulates with a cane,, hemodynamically stable.   Neck: No JVD present.  Cardiovascular: Normal rate, regular rhythm, S1 normal, S2 normal, intact distal pulses and normal pulses. Exam reveals no gallop, no S3 and no S4.  Murmur heard. Harsh systolic murmur is present with a grade of 3/6 at the upper right sternal border radiating to the neck. Pulses:      Carotid pulses are  on the right side with bruit and  on the left side with bruit. Pulmonary/Chest: Effort normal and breath sounds normal. No stridor. She has  no wheezes. She has no rales.  Bilateral mastectomies  Abdominal: Soft. Bowel sounds are normal. She exhibits no distension. There is no abdominal tenderness.  Musculoskeletal:        General: Tenderness and edema present.     Cervical back: Neck supple.  Neurological: She is alert and oriented to person, place, and time. She has intact cranial nerves (2-12).  Skin: Skin is warm and moist.   CARDIAC DATABASE: EKG: Sep 04, 2022: Sinus rhythm, 79 bpm, left axis, left anterior fascicular block, right bundle branch block, without underlying injury pattern.  Echocardiogram: 09/13/2022  1. Left ventricular ejection fraction, by estimation, is 60 to 65%. The  left ventricle has normal function. The left ventricle has no regional  wall motion abnormalities. There is mild left ventricular hypertrophy.  Left  ventricular diastolic parameters  were normal.   2. Grossly RV function appeaars normal. Right ventricular systolic  function was not well visualized. The right ventricular size is normal.   3. The mitral valve is normal in structure. Trivial mitral valve  regurgitation. No evidence of mitral stenosis.   4. Unable to determine AS severity due to no apical windows due to  mastectomy. Recommend TEE. The aortic valve is tricuspid. Unable to  determine aortic valve morphology due to image quality. There is severe  calcifcation of the aortic valve. There is  moderate thickening of the aortic valve. Aortic valve regurgitation is not  visualized.   TEE 10/14/2022:  1. Left ventricular ejection fraction, by estimation, is 55 to 60%. The  left ventricle has normal function.   2. Right ventricular systolic function is normal. The right ventricular  size is normal.   3. No left atrial/left atrial appendage thrombus was detected.   4. The mitral valve is normal in structure. Mild to moderate mitral valve  regurgitation. No evidence of mitral stenosis.   5. Tricuspid valve regurgitation is mild to moderate.   6. Aortic stenosis severe by AVA, moderate by gradients, velocity, as well as dimensionless index 0.28. Marland Kitchen The aortic valve is calcified. Aortic valve regurgitation is not visualized. Aortic valve area, by VTI measures 0.81 cm. Aortic valve mean gradient measures 31.0 mmHg. Aortic valve Vmax measures 3.57 m/s.   7. There is mild (Grade II) plaque.    Stress Testing: Regadenoson Nuclear stress test 09/12/2022: There is a moderate sized moderate reversible defect in the lateral, inferior and infero-apical regions.  Overall LV systolic function is abnormal with regional wall motion abnormalities in the same distribution (RCA) Stress LV EF: 46%.  Nondiagnostic ECG stress. The heart rate response was consistent with Regadenoson.  No previous exam available for comparison. Intermediate risk study.    Heart Catheterization: 10/14/2022 LM: Normal. No significant disease LAD: Normal. No significant disease Lcx: Dominant. No significant disease RCA: Nondominant. No significant disease   RA: 8 mmHg RV: 49/6 mmHg PA:46/25 mmHg, mPAP 31 mmHg PCW: 17 mmHg   CO: 5.3 L.min CI: 2.6 L/min/m2   Simultaneous LV-AO measurement using Langston cathteer Aortic valve mean PG 25 mmHg, AVA 0.94 cm2   Conclusion: No significant coronary artery disease Mildly elevated filling pressures Mild PH, WHO Grp II Aortic stenosis, moderate by gradients, severe by area. In the correct context, aortic stenosis could possibly be paradoxically low flow low gradient. Alternatively, dyspnea could be due to mild PH, possible diastolic dysfunction. Recommend clinical correlation.  Carotid artery duplex 09/22/2022 Duplex suggests stenosis in the right internal carotid artery (minimal). Duplex suggests stenosis in the left internal  carotid artery (1-15%). Both vertebral arteries are patent with antegrade flow. No prior studies for comparison. Follow up in one year is appropriate if clinically indicated.  LABORATORY DATA:    Latest Ref Rng & Units 10/14/2022   10:03 AM 10/09/2022    3:20 PM 09/04/2022   12:15 PM  CBC  WBC 3.4 - 10.8 x10E3/uL  6.6    Hemoglobin 12.0 - 15.0 g/dL 30.8 - 65.7 g/dL 84.6    96.2  95.2  84.1   Hematocrit 36.0 - 46.0 % 36.0 - 46.0 % 41.0    39.0  42.5  42.6   Platelets 150 - 450 x10E3/uL  175         Latest Ref Rng & Units 10/14/2022   10:03 AM 10/09/2022    3:20 PM 09/04/2022   12:15 PM  CMP  Glucose 70 - 99 mg/dL  324  401   BUN 8 - 27 mg/dL  16  13   Creatinine 0.27 - 1.00 mg/dL  2.53  6.64   Sodium 403 - 145 mmol/L 135 - 145 mmol/L 138    138  143  142   Potassium 3.5 - 5.1 mmol/L 3.5 - 5.1 mmol/L 3.6    3.6  3.7  4.2   Chloride 96 - 106 mmol/L  98  99   CO2 20 - 29 mmol/L  28  26   Calcium 8.7 - 10.3 mg/dL  9.7  9.7   Total Protein 6.0 - 8.5 g/dL  6.3     Total Bilirubin 0.0 - 1.2 mg/dL  0.5    Alkaline Phos 44 - 121 IU/L  62    AST 0 - 40 IU/L  48    ALT 0 - 32 IU/L  30      Lipid Panel  No results found for: "CHOL", "TRIG", "HDL", "CHOLHDL", "VLDL", "LDLCALC", "LDLDIRECT", "LABVLDL"  No components found for: "NTPROBNP" No results for input(s): "PROBNP" in the last 8760 hours. No results for input(s): "TSH" in the last 8760 hours.  BMP Recent Labs    09/04/22 1215 10/09/22 1520 10/14/22 1003  NA 142 143 138  138  K 4.2 3.7 3.6  3.6  CL 99 98  --   CO2 26 28  --   GLUCOSE 146* 173*  --   BUN 13 16  --   CREATININE 0.66 0.73  --   CALCIUM 9.7 9.7  --     HEMOGLOBIN A1C No results found for: "HGBA1C", "MPG"  External Labs: Collected: February 03, 2022. BUN 14, creatinine 0.59. Sodium 139, potassium 3.9, chloride 99, bicarb 33. Total cholesterol 194, triglycerides 123, HDL 54, calculated LDL 118, non-HDL 140   IMPRESSION:    ICD-10-CM   1. Nonrheumatic aortic valve stenosis  I35.0 Ambulatory referral to Cardiology    2. Shortness of breath  R06.02     3. Near syncope  R55     4. Benign hypertension  I10     5. HX: breast cancer  Z85.3     6. H/O bilateral mastectomy  Z90.13         RECOMMENDATIONS: CEILIDH TORREGROSSA is a 77 y.o. Caucasian female whose past medical history and cardiac risk factors include: Aortic stenosis, HTN, former smoker, hx breast cancer x2 (status postmastectomy, radiation, chemotherapy), history of ventricular tachycardia.  Both transesophageal echocardiogram and right heart catheterization hemodynamics illustrate at least moderate aortic stenosis based on gradients and severe aortic stenosis based on valve area.  Clinically patient continues  to have shortness of breath with activities of daily living and also endorses episodes of near syncope.  It is in my best medical judgment that she is experiencing symptomatic aortic stenosis and given the discordant findings between gradients and  aortic valve area it would be of clincal importance to check the aortic valve calcium score to further define severity of aortic stenosis.  I could proceed forward with a coronary calcium score which may shed light on this versus referring to structural heart team for further guidance.  Patient prefers the latter.  Will refer her to Dr. Tonny Bollman to see if she is a candidate for transcatheter aortic valve replacement given her cardiovascular workup thus far and continued dyspnea/near syncope despite optimization of blood pressure and diuresis.  With uptitration of medical therapy her blood pressures have improved, she has diuresed well (has lost 11 pounds since May 2024), continue current medical therapy.  Further recommendations to follow.  FINAL MEDICATION LIST END OF ENCOUNTER: No orders of the defined types were placed in this encounter.   Medications Discontinued During This Encounter  Medication Reason   lisinopril-hydrochlorothiazide (ZESTORETIC) 10-12.5 MG tablet       Current Outpatient Medications:    acetaminophen (TYLENOL) 650 MG CR tablet, Take 650-1,300 mg by mouth daily., Disp: , Rfl:    albuterol (VENTOLIN HFA) 108 (90 Base) MCG/ACT inhaler, Inhale 2 puffs into the lungs every 4 (four) hours as needed for wheezing or shortness of breath., Disp: , Rfl:    B Complex Vitamins (B COMPLEX PO), Take 1 capsule by mouth daily., Disp: , Rfl:    Cholecalciferol (VITAMIN D3) 125 MCG (5000 UT) TABS, Take 5,000 Units by mouth daily., Disp: , Rfl:    lisinopril-hydrochlorothiazide (ZESTORETIC) 20-12.5 MG tablet, Take 1 tablet by mouth daily., Disp: , Rfl:    Magnesium 250 MG TABS, Take 250 mg by mouth daily as needed (Cramps)., Disp: , Rfl:    Multiple Vitamin (MULTIVITAMINS PO), Take 1 tablet by mouth daily., Disp: , Rfl:    naproxen sodium (ALEVE) 220 MG tablet, Take 220 mg by mouth daily as needed (every other month)., Disp: , Rfl:    OZEMPIC, 0.25 OR 0.5 MG/DOSE, 2 MG/3ML SOPN,  Inject into the skin once a week., Disp: , Rfl:    pantoprazole (PROTONIX) 40 MG tablet, Take 40 mg by mouth daily., Disp: , Rfl:    Specialty Vitamins Products (COLLAGEN ULTRA) CAPS, See admin instructions., Disp: , Rfl:    torsemide (DEMADEX) 10 MG tablet, Take 1 tablet (10 mg total) by mouth every morning., Disp: 30 tablet, Rfl: 3  Orders Placed This Encounter  Procedures   Ambulatory referral to Cardiology    There are no Patient Instructions on file for this visit.   --Continue cardiac medications as reconciled in final medication list. --Return in about 3 months (around 01/28/2023) for Follow up Aortic stenosis. Marland Kitchen or sooner if needed. --Continue follow-up with your primary care physician regarding the management of your other chronic comorbid conditions.  Patient's questions and concerns were addressed to her satisfaction. She voices understanding of the instructions provided during this encounter.   This note was created using a voice recognition software as a result there may be grammatical errors inadvertently enclosed that do not reflect the nature of this encounter. Every attempt is made to correct such errors.  Tessa Lerner, [place], [facility]  Pager:  [phone] Office: [phone]

## 2022-11-05 DIAGNOSIS — R350 Frequency of micturition: Secondary | ICD-10-CM | POA: Diagnosis not present

## 2022-11-05 DIAGNOSIS — N3946 Mixed incontinence: Secondary | ICD-10-CM | POA: Diagnosis not present

## 2022-11-06 NOTE — Progress Notes (Signed)
[facility] Consult Note  Chief Complaint  Patient presents with   New Patient (Initial Visit)    Severe aortic stenosis   History of Present Illness: 77 yo female with history of breast cancer, former tobacco abuse, ventricular tachycardia, diabetes, HTN and severe aortic stenosis who is here today as a new consult, referred by Dr.Tolia, for the evaluation of aortic stenosis and further discussion regarding possible TAVR. Echo may 2024 with LVEF=60-65%. The AV was not well seen. TEE July 2024 with LVEF=55-60%. Normal RV size and function. Mild to moderate mitral regurgitation. Moderately severe aortic stenosis with mean gradient 31 mmHg, peak gradient 51 mmHg, AVA 0.80 cm2, DI 0.28. Cardiac cath 10/14/22 with no significant CAD. Mean PA pressure 31 mmHg, PCWP 17 mmHg. Aortic valve mean gradient 25 mmHg, AVA 0.94 cm2. She has been started on Torsemide over the past month for lower extremity and hand edema.   She tells me today that she continues to have dyspnea with exertion despite being diuresed. She also endorses dizziness and near syncope. No chest pain.  She lives in [place], [place] with her husband. She is semi-retired working for [employer]. She has full dentures.   Primary Care Physician: Joycelyn Rua, MD Primary Cardiologist: Odis Hollingshead Referring Cardiologist: Odis Hollingshead  Past Medical History:  Diagnosis Date   Allergic rhinitis    Breast cancer, left [facility]) 2007   ductal carcinoma in situ, underwent left mastectomy 07/2005; reconstructive surgery with expander, permanent implant placed 03/2006, hospitalized January 2008 with breast cellulitis.   Breast cancer, right (HCC) 1998   1998; modified radical right mastectomy 1998 for infiltrating ductal adenocarcinoma with 7 of 12 axillary nodes positive. Underwent adjuvant chemotherapy and bone marrow transplant by Dr Greggory Stallion as well as radiation therapy. Also had Tram flap reconstruction.   Diabetes mellitus without complication  (HCC)    History of ventricular tachycardia    Hypertension    Vitamin D deficiency     Past Surgical History:  Procedure Laterality Date   biopsy on scalp  2010   breast implant Left 2007   CESAREAN SECTION     childbirth     63, 66, 67, 70   EYE SURGERY  1952   hysterectomy with one ovary/due to fibroids  1987   left mastectomy Left 2007   NASAL SEPTUM SURGERY  1975   PORTACATH PLACEMENT  1998   right mastectomy Right 1998   RIGHT/LEFT HEART CATH AND CORONARY ANGIOGRAPHY N/A 10/14/2022   Procedure: RIGHT/LEFT HEART CATH AND CORONARY ANGIOGRAPHY;  Surgeon: Elder Negus, MD;  Location: [facility] INVASIVE CV LAB;  Service: Cardiovascular;  Laterality: N/A;   stem cell infusion  1998   surgery for crossed eyes     TEE WITHOUT CARDIOVERSION N/A 10/14/2022   Procedure: TRANSESOPHAGEAL ECHOCARDIOGRAM;  Surgeon: Elder Negus, MD;  Location: [facility] INVASIVE CV LAB;  Service: Cardiovascular;  Laterality: N/A;   TONSILLECTOMY  1961   tran flap R breast Right 1998   tri-port  1998   TUBAL LIGATION      Current Outpatient Medications  Medication Sig Dispense Refill   acetaminophen (TYLENOL) 650 MG CR tablet Take 650-1,300 mg by mouth daily.     albuterol (VENTOLIN HFA) 108 (90 Base) MCG/ACT inhaler Inhale 2 puffs into the lungs every 4 (four) hours as needed for wheezing or shortness of breath.     B Complex Vitamins (B COMPLEX PO) Take 1 capsule by mouth daily.     Cholecalciferol (VITAMIN D3) 125 MCG (  5000 UT) TABS Take 5,000 Units by mouth daily.     lisinopril-hydrochlorothiazide (ZESTORETIC) 20-12.5 MG tablet Take 1 tablet by mouth daily.     Magnesium 250 MG TABS Take 250 mg by mouth daily as needed (Cramps).     Multiple Vitamin (MULTIVITAMINS PO) Take 1 tablet by mouth daily.     naproxen sodium (ALEVE) 220 MG tablet Take 220 mg by mouth daily as needed (every other month).     OZEMPIC, 0.25 OR 0.5 MG/DOSE, 2 MG/3ML SOPN Inject into the skin once a week.     pantoprazole  (PROTONIX) 40 MG tablet Take 40 mg by mouth daily.     solifenacin (VESICARE) 5 MG tablet Take 5 mg by mouth daily.     torsemide (DEMADEX) 10 MG tablet Take 1 tablet (10 mg total) by mouth every morning. 30 tablet 3   Specialty Vitamins Products (COLLAGEN ULTRA) CAPS See admin instructions. (Patient not taking: Reported on 11/07/2022)     No current facility-administered medications for this visit.    Allergies  Allergen Reactions   Penicillin G Anaphylaxis    Other Reaction(s): dry mouth  Other Reaction(s): Rash/SOB   Penicillins Rash and Shortness Of Breath    Other Reaction(s): rash/sob   Bee Venom Swelling    Bee stings   Morphine Other (See Comments) and Nausea And Vomiting    Social History   Socioeconomic History   Marital status: Married    Spouse name: Not on file   Number of children: 4   Years of education: Not on file   Highest education level: Some college, no degree  Occupational History   Occupation: Semi-retired working for [employer]  Tobacco Use   Smoking status: Former    Current packs/day: 0.00    Average packs/day: 1 pack/day for 36.0 years (36.0 ttl pk-yrs)    Types: Cigarettes    Start date: 28    Quit date: 1998    Years since quitting: 26.5   Smokeless tobacco: Never   Tobacco comments:    At first maybe 1 pack per week then it moved up to 1 ppd later.  Vaping Use   Vaping status: Never Used  Substance and Sexual Activity   Alcohol use: Yes    Comment: 2-3 drinks per week seldom   Drug use: Never   Sexual activity: Not on file  Other Topics Concern   Not on file  Social History Narrative   Lives at home with spouse   Right handed   Caffeine: coffee or tea, 2/day       2 deceased sons , 1 son passed from heart attack   Social Determinants of Health   Financial Resource Strain: Not on file  Food Insecurity: Not on file  Transportation Needs: Not on file  Physical Activity: Not on file  Stress: Not on file  Social  Connections: Not on file  Intimate Partner Violence: Not on file    Family History  Problem Relation Age of Onset   Dementia Mother    Prostate cancer Father    Arrhythmia Sister    Hypertension Sister    Breast cancer Paternal Aunt    Hypertension Maternal Grandmother    Heart Problems Maternal Grandfather    Diabetes Paternal Grandmother    Heart Problems Paternal Grandmother    Blindness Paternal Grandfather    Neuropathy Neg Hx     Review of Systems:  As stated in the HPI and otherwise negative.   BP Marland Kitchen)  146/76   Pulse (!) 54   Ht 5\' 4"  (1.626 m)   Wt 97.3 kg   SpO2 94%   BMI 36.84 kg/m   Physical Examination: General: Well developed, well nourished, NAD  HEENT: OP clear, mucus membranes moist  SKIN: warm, dry. No rashes. Neuro: No focal deficits  Musculoskeletal: Muscle strength 5/5 all ext  Psychiatric: Mood and affect normal  Neck: No JVD, no carotid bruits, no thyromegaly, no lymphadenopathy.  Lungs:Clear bilaterally, no wheezes, rhonci, crackles Cardiovascular: Regular rate and rhythm. Loud, harsh, late peaking systolic murmur.  Abdomen:Soft. Bowel sounds present. Non-tender.  Extremities: Trace bilateral lower extremity edema. Pulses are 2 + in the bilateral DP/PT.  EKG:  EKG is not ordered today. The ekg ordered today demonstrates  EKG from 10/14/22 with sinus, RBBB, LAFB  TEE 10/14/22:   1. Left ventricular ejection fraction, by estimation, is 55 to 60%. The  left ventricle has normal function.   2. Right ventricular systolic function is normal. The right ventricular  size is normal.   3. No left atrial/left atrial appendage thrombus was detected.   4. The mitral valve is normal in structure. Mild to moderate mitral valve  regurgitation. No evidence of mitral stenosis.   5. Tricuspid valve regurgitation is mild to moderate.   6. Aortic stenosis severe by AVA, moderate by gradients, velocity, as  well as dimensionless index 0.28. Marland Kitchen The aortic valve is  calcified. Aortic  valve regurgitation is not visualized. Aortic valve area, by VTI measures  0.81 cm. Aortic valve mean  gradient measures 31.0 mmHg. Aortic valve Vmax measures 3.57 m/s.   7. There is mild (Grade II) plaque.   FINDINGS   Left Ventricle: Left ventricular ejection fraction, by estimation, is 55  to 60%. The left ventricle has normal function. The left ventricular  internal cavity size was normal in size.   Right Ventricle: The right ventricular size is normal. No increase in  right ventricular wall thickness. Right ventricular systolic function is  normal.   Left Atrium: Left atrial size was normal in size. No left atrial/left  atrial appendage thrombus was detected.   Right Atrium: Right atrial size was normal in size.   Pericardium: There is no evidence of pericardial effusion.   Mitral Valve: The mitral valve is normal in structure. Mild to moderate  mitral valve regurgitation. No evidence of mitral valve stenosis.   Tricuspid Valve: The tricuspid valve is normal in structure. Tricuspid  valve regurgitation is mild to moderate. No evidence of tricuspid  stenosis.   Aortic Valve: Aortic stenosis severe by AVA, moderate by gradients,  velocity, as well as dimensionless index 0.28. The aortic valve is  calcified. Aortic valve regurgitation is not visualized. Aortic valve mean  gradient measures 31.0 mmHg. Aortic valve  peak gradient measures 51.0 mmHg. Aortic valve area, by VTI measures 0.81  cm.   Pulmonic Valve: The pulmonic valve was normal in structure. Pulmonic valve  regurgitation is not visualized. No evidence of pulmonic stenosis.   Aorta: The aortic root is normal in size and structure. There is mild  (Grade II) plaque.   IAS/Shunts: No atrial level shunt detected by color flow Doppler.     LEFT VENTRICLE  PLAX 2D  LVOT diam:     1.90 cm  LV SV:         64  LV SV Index:   32  LVOT Area:     2.84 cm     AORTIC VALVE  AV  Area (Vmax):     0.79 cm  AV Area (Vmean):   0.72 cm  AV Area (VTI):     0.81 cm  AV Vmax:           357.00 cm/s  AV Vmean:          255.500 cm/s  AV VTI:            0.791 m  AV Peak Grad:      51.0 mmHg  AV Mean Grad:      31.0 mmHg  LVOT Vmax:         100.00 cm/s  LVOT Vmean:        65.000 cm/s  LVOT VTI:          0.226 m  LVOT/AV VTI ratio: 0.29     SHUNTS  Systemic VTI:  0.23 m  Systemic Diam: 1.90 cm   Cardiac cath 10/14/22:   LV end diastolic pressure is mildly elevated.   No indication for antiplatelet therapy at this time .   LM: Normal. No significant disease LAD: Normal. No significant disease Lcx: Dominant. No significant disease RCA: Nondominant. No significant disease   RA: 8 mmHg RV: 49/6 mmHg PA:46/25 mmHg, mPAP 31 mmHg PCW: 17 mmHg   CO: 5.3 L.min CI: 2.6 L/min/m2   Simultaneous LV-AO measurement using Langston cathteer Aortic valve mean PG 25 mmHg, AVA 0.94 cm2   Conclusion: No significant coronary artery disease Mildly elevated filling pressures Mild PH, WHO Grp II Aortic stenosis, moderate by gradients, severe by area. In the correct context, aortic stenosis could possibly be paradoxically low flow low gradient. Alternatively, dyspnea could be due to mild PH, possible diastolic dysfunction. Recommend clinical correlation.    Elder Negus, MD Pager: [phone] Office: [phone]     Recommendations  Antiplatelet/Anticoag No indication for antiplatelet therapy at this time .   Surgeon Notes    10/14/2022  8:36 AM CV Procedure signed by Elder Negus, MD   Indications  Nonrheumatic aortic valve stenosis [I35.0 (ICD-10-CM)]   Procedural Details  Technical Details Procedures: 1.  Ultrasound-guided right common femoral artery, and right common femoral vein access. 2. Right heart catheterization 3. Left heart catheterization 4. Selective left and right coronary angiography 5. Conscious sedation monitoring 56  min  Indication: Exertional dyspnea Aortic stenosis Abnormal stress test  History: 77 year old Caucasian female with hypertension,  aortic stenosis, ?h/o ventricular tachycardia,h/o breast cancerX2 (s/p bilateral mastectomy, radiation, chemotherapy), now with exertional dyspnea, abnormal stress test   Diagnostic Angiography: Catheter/s advances over guidewire under fluoroscopy Left coronary artery: 5 Fr JL 4  Right coronary artery: 5 Fr JR 4 Left heart catheterization: 6 Fr Langston   Pressures tracings obtained in right atrium, right ventricle, pulmonary artery, and pulmonary capillary wedge position. Invasive hemodynamic measurements performed using Fick method.     Anticoagulation:  None  Hemostasis: Manual pressure  Total contrast used: 45 cc   Total fluoro time: 10.6 min Air Kerma: 227 mGy  Conscious sedation was administered under my direct supervision. IV Versed 1 mg and fentanyl 25 mcg administered. Continuous ECG, pulse oximetry and blood pressure were monitored throughout the entire procedure. Monitoring corroborated by cath lab nurse and technician.  Total sedation time: 56 minutes.  All wires and catheters removed out of the body at the end of the procedure Final angiogram showed no dissection/perforation.   [image] Elder Negus, MD Pager: [phone] Office: [phone]          Estimated blood  loss <50 mL.   During this procedure medications were administered to achieve and maintain moderate conscious sedation while the patient's heart rate, blood pressure, and oxygen saturation were continuously monitored and I was present face-to-face 100% of this time.   Medications (Filter: Administrations occurring from 0922 to 1036 on 10/14/22)  important  Continuous medications are totaled by the amount administered until 10/14/22 1036.   Heparin (Porcine) in NaCl 1000-0.9 UT/500ML-% SOLN (mL)  Total volume: 1,000 mL Date/Time  Rate/Dose/Volume Action   10/14/22 0934 500 mL Given   0934 500 mL Given   fentaNYL (SUBLIMAZE) injection (mcg)  Total dose: 25 mcg Date/Time Rate/Dose/Volume Action   10/14/22 0935 25 mcg Given   midazolam (VERSED) injection (mg)  Total dose: 1 mg Date/Time Rate/Dose/Volume Action   10/14/22 0935 1 mg Given   lidocaine (PF) (XYLOCAINE) 1 % injection (mL)  Total volume: 10 mL Date/Time Rate/Dose/Volume Action   10/14/22 0940 10 mL Given   iohexol (OMNIPAQUE) 350 MG/ML injection (mL)  Total volume: 45 mL Date/Time Rate/Dose/Volume Action   10/14/22 1035 45 mL Given    Sedation Time  Sedation Time Physician-1: 56 minutes 31 seconds Contrast     Administrations occurring from 0922 to 1036 on 10/14/22:  Medication Name Total Dose  iohexol (OMNIPAQUE) 350 MG/ML injection 45 mL   Radiation/Fluoro  Fluoro time: 10.6 (min) DAP: 15466 (mGycm2) Cumulative Air Kerma: 227 (mGy) Complications  Complications documented before study signed (10/14/2022 10:56 AM)   No complications were associated with this study.  Documented by Ledell Peoples, RN - 10/14/2022 10:36 AM     Coronary Findings  Diagnostic Dominance: Left Left Main  Vessel is normal in caliber. Vessel is angiographically normal.    Left Anterior Descending  Vessel is normal in caliber. Vessel is angiographically normal.    Left Circumflex  Vessel is normal in caliber. Vessel is angiographically normal.    Right Coronary Artery  Vessel is small. Vessel is angiographically normal.    Intervention   No interventions have been documented.   Right Heart  Right Heart Pressures RA: 8 mmHg RV: 49/6 mmHg PA:46/25 mmHg, mPAP 31 mmHg PCW: 17 mmHg  CO: 5.3 L.min CI: 2.6 L/min/m2   Left Heart  Left Ventricle LV end diastolic pressure is mildly elevated. Aortic valve mean PG 25 mmHg, AVA 0.94 cm2   Coronary Diagrams  Diagnostic Dominance: Left  Intervention   Implants   No implant documentation for  this case.   Syngo Images   Show images for CARDIAC CATHETERIZATION Images on Long Term Storage   Show images for Kaiulani, [place] to Procedure Log  Procedure Log   Link to Procedure Log  Procedure Log    Hemo Data  Flowsheet Row Most Recent Value  Fick Cardiac Output 5.29 L/min  Fick Cardiac Output Index 2.61 (L/min)/BSA  Aortic Mean Gradient 25.18 mmHg  Aortic Peak Gradient 29.1 mmHg  Aortic Valve Area 0.94  Aortic Value Area Index 0.46 cm2/BSA  RA A Wave 13 mmHg  RA V Wave 10 mmHg  RA Mean 8 mmHg  RV Systolic Pressure 49 mmHg  RV Diastolic Pressure 6 mmHg  RV EDP 12 mmHg  PA Systolic Pressure 46 mmHg  PA Diastolic Pressure 25 mmHg  PA Mean 31 mmHg  PW A Wave 21 mmHg  PW V Wave 23 mmHg  PW Mean 17 mmHg  AO Systolic Pressure 164 mmHg  AO Diastolic Pressure 74 mmHg  AO Mean 110 mmHg  LV Systolic  Pressure 193 mmHg  LV Diastolic Pressure 14 mmHg  LV EDP 26 mmHg  AOp Systolic Pressure 161 mmHg  AOp Diastolic Pressure 73 mmHg  AOp Mean Pressure 110 mmHg  LVp Systolic Pressure 179 mmHg  LVp Diastolic Pressure 20 mmHg  LVp EDP Pressure 31 mmHg  QP/QS 1  TPVR Index 11.9 HRUI  TSVR Index 43.75 HRUI  PVR SVR Ratio 0.13  TPVR/TSVR Ratio 0.27      Recent Labs: 09/04/2022: Magnesium 1.9 10/09/2022: ALT 30; BUN 16; Creatinine, Ser 0.73; Platelets 175 10/14/2022: Hemoglobin 13.9; Hemoglobin 13.3; Potassium 3.6; Potassium 3.6; Sodium 138; Sodium 138    Wt Readings from Last 3 Encounters:  11/07/22 97.3 kg  10/28/22 98 kg  10/14/22 98.9 kg    Assessment and Plan:   1. Severe Aortic Valve Stenosis: She has moderately severe to severe, stage D3 low flow/low gradient aortic valve stenosis. She has NYHA class 3 symptoms. Her symptoms seem to be related to her valve disease. I have personally reviewed the echo images. The aortic valve is thickened and calcified with limited leaflet mobility. I think she would benefit from AVR. Given advanced age, she is not a good  candidate for conventional AVR by surgical approach. I think she may be a good candidate for TAVR.   I have reviewed the natural history of aortic stenosis with the patient and their family members  who are present today. We have discussed the limitations of medical therapy and the poor prognosis associated with symptomatic aortic stenosis. We have reviewed potential treatment options, including palliative medical therapy, conventional surgical aortic valve replacement, and transcatheter aortic valve replacement. We discussed treatment options in the context of the patient's specific comorbid medical conditions.   She would like to proceed with planning for TAVR. Risks and benefits of the valve procedure are reviewed with the patient. Will will arrange a cardiac CT, CTA of the chest/abdomen and pelvis and she will then be referred to see one of the CT surgeons on our TAVR team.   BMET today.  High risk for pacemaker post TAVR given underlying RBBB, LAFB    Labs/ tests ordered today include:   Orders Placed This Encounter  Procedures   Basic Metabolic Panel (BMET)   Disposition:   F/U will be arranged with the structural team  Signed, Verne Carrow, MD, [facility] 11/07/2022 9:53 AM    [facility] [address] [place], [place], [place]  [address] Phone: [phone]; Fax: [phone]

## 2022-11-07 ENCOUNTER — Other Ambulatory Visit: Payer: Self-pay

## 2022-11-07 ENCOUNTER — Encounter: Payer: Self-pay | Admitting: Cardiovascular Disease

## 2022-11-07 ENCOUNTER — Ambulatory Visit: Payer: Medicare HMO | Attending: Cardiovascular Disease | Admitting: Cardiovascular Disease

## 2022-11-07 VITALS — BP 146/76 | HR 54 | Ht 64.0 in | Wt 214.6 lb

## 2022-11-07 DIAGNOSIS — I350 Nonrheumatic aortic (valve) stenosis: Secondary | ICD-10-CM | POA: Diagnosis not present

## 2022-11-07 NOTE — Progress Notes (Signed)
Pre Surgical Assessment: 5 M Walk Test  31M=16.58ft  5 Meter Walk Test- trial 1: 12.03 seconds 5 Meter Walk Test- trial 2: 9.83 seconds 5 Meter Walk Test- trial 3: 9.54 seconds 5 Meter Walk Test Average: 10.46 seconds  ________________________  Procedure Type: Isolated AVR PERIOPERATIVE OUTCOME ESTIMATE % Operative Mortality 3.97% Morbidity & Mortality 12% Stroke 0.986% Renal Failure 2.05% Reoperation 3.73% Prolonged Ventilation 7.29% Deep Sternal Wound Infection 0.093% Long Hospital Stay (>14 days) 6.18% Short Hospital Stay (<6 days)* 34.7%

## 2022-11-07 NOTE — Patient Instructions (Signed)
Medication Instructions:  No changes *If you need a refill on your cardiac medications before your next appointment, please call your pharmacy*   Lab Work: Today: bmet If you have labs (blood work) drawn today and your tests are completely normal, you will receive your results only by: MyChart Message (if you have MyChart) OR A paper copy in the mail If you have any lab test that is abnormal or we need to change your treatment, we will call you to review the results.   Testing/Procedures: CT scans and office visit with surgeon - see instructions   Follow-Up: Per Structural Heart Team

## 2022-11-11 ENCOUNTER — Ambulatory Visit (HOSPITAL_COMMUNITY)
Admission: RE | Admit: 2022-11-11 | Discharge: 2022-11-11 | Disposition: A | Discharge status: Home / Self Care | Payer: Medicare HMO | Source: Ambulatory Visit | Attending: Cardiovascular Disease | Admitting: Cardiovascular Disease

## 2022-11-11 DIAGNOSIS — I700 Atherosclerosis of aorta: Secondary | ICD-10-CM | POA: Insufficient documentation

## 2022-11-11 DIAGNOSIS — I281 Aneurysm of pulmonary artery: Secondary | ICD-10-CM | POA: Diagnosis not present

## 2022-11-11 DIAGNOSIS — K449 Diaphragmatic hernia without obstruction or gangrene: Secondary | ICD-10-CM | POA: Diagnosis not present

## 2022-11-11 DIAGNOSIS — I2510 Atherosclerotic heart disease of native coronary artery without angina pectoris: Secondary | ICD-10-CM | POA: Diagnosis not present

## 2022-11-11 DIAGNOSIS — K8020 Calculus of gallbladder without cholecystitis without obstruction: Secondary | ICD-10-CM | POA: Diagnosis not present

## 2022-11-11 DIAGNOSIS — Z01818 Encounter for other preprocedural examination: Secondary | ICD-10-CM | POA: Insufficient documentation

## 2022-11-11 DIAGNOSIS — K828 Other specified diseases of gallbladder: Secondary | ICD-10-CM | POA: Diagnosis not present

## 2022-11-11 DIAGNOSIS — I350 Nonrheumatic aortic (valve) stenosis: Secondary | ICD-10-CM | POA: Diagnosis not present

## 2022-11-11 DIAGNOSIS — K4020 Bilateral inguinal hernia, without obstruction or gangrene, not specified as recurrent: Secondary | ICD-10-CM | POA: Diagnosis not present

## 2022-11-11 MED ORDER — IOHEXOL 350 MG/ML SOLN
100.0000 mL | Freq: Once | INTRAVENOUS | Status: AC | PRN
  Administered 2022-11-11: 100 mL via INTRAVENOUS

## 2022-11-18 ENCOUNTER — Other Ambulatory Visit: Payer: Self-pay

## 2022-11-18 DIAGNOSIS — R0602 Shortness of breath: Secondary | ICD-10-CM

## 2022-11-18 MED ORDER — TORSEMIDE 10 MG PO TABS
10.0000 mg | ORAL_TABLET | Freq: Every morning | ORAL | 3 refills | Status: DC
Start: 2022-11-18 — End: 2023-03-03

## 2022-11-26 ENCOUNTER — Other Ambulatory Visit: Payer: Self-pay

## 2022-11-26 ENCOUNTER — Institutional Professional Consult (permissible substitution): Payer: Medicare HMO | Admitting: Surgery

## 2022-11-26 ENCOUNTER — Encounter: Payer: Self-pay | Admitting: Surgery

## 2022-11-26 VITALS — BP 135/76 | HR 81 | Resp 20 | Ht 64.0 in | Wt 214.0 lb

## 2022-11-26 DIAGNOSIS — I350 Nonrheumatic aortic (valve) stenosis: Secondary | ICD-10-CM

## 2022-11-26 NOTE — Progress Notes (Signed)
Patient ID: [name], female   DOB: 1946/03/25, 77 y.o.   MRN: [id number]  [facility]       [address]       [place] [address]             [phone]          CARDIOTHORACIC SURGERY CONSULTATION REPORT  PCP is Joycelyn Rua, MD Referring Provider is Verne Carrow, MD Primary Cardiologist is Tessa Lerner, DO  Reason for consultation: Severe aortic stenosis  HPI:  The patient is a 77 year old woman with a history of hypertension, diabetes, remote smoking, ventricular tachycardia, right breast cancer in 1998 treated with right radical mastectomy with adjuvant chemotherapy and radiation therapy to the chest followed by bone marrow transplant and TRAM flap reconstruction, left breast ductal carcinoma in situ status post left mastectomy in 2007 with reconstructive surgery using an implant, and severe aortic stenosis who was referred for consideration of TAVR.  She had a 2D echo in May 2024 but it was not possible to determine the severity of her aortic stenosis due to poor windows related to her mastectomies and reconstructions.  She underwent a TEE on 10/14/2022 which showed a calcified aortic valve with a mean gradient of 31 mmHg and a peak of 51 mmHg with a valve area by VTI of 0.81 cm.  Dimensionless index was 0.28 with a stroke-volume index of 32.  Left ventricular ejection fraction was 55 to 60%.  She subsequent underwent cardiac catheterization showing no significant coronary disease.  The aortic valve simultaneous mean gradient was 25 mmHg with a valve area of 0.94 cm.  There is mild pulmonary hypertension at 46/25 with a mean of 31.  Pulmonary capillary wedge pressure was 17.  She is here today with her husband.  She reports development of progressive exertional shortness of breath fatigue and generalized weakness since March 2024.  She has had several episodes of dizziness but no syncope.  She said no  substernal chest discomfort but has had discomfort in her epigastrium with exertion.  She has lower extremity edema which has not improved on torsemide.  Past Medical History:  Diagnosis Date   Allergic rhinitis    Breast cancer, left (HCC) 2007   ductal carcinoma in situ, underwent left mastectomy 07/2005; reconstructive surgery with expander, permanent implant placed 03/2006, hospitalized January 2008 with breast cellulitis.   Breast cancer, right (HCC) 1998   1998; modified radical right mastectomy 1998 for infiltrating ductal adenocarcinoma with 7 of 12 axillary nodes positive. Underwent adjuvant chemotherapy and bone marrow transplant by Dr Greggory Stallion as well as radiation therapy. Also had Tram flap reconstruction.   Diabetes mellitus without complication (HCC)    History of ventricular tachycardia    Hypertension    Vitamin D deficiency     Past Surgical History:  Procedure Laterality Date   biopsy on scalp  2010   breast implant Left 2007   CESAREAN SECTION     childbirth     63, 66, 67, 70   EYE SURGERY  1952   hysterectomy with one ovary/due to fibroids  1987   left mastectomy Left 2007   NASAL SEPTUM SURGERY  1975   PORTACATH PLACEMENT  1998   right mastectomy Right 1998   RIGHT/LEFT HEART CATH AND CORONARY ANGIOGRAPHY N/A 10/14/2022   Procedure: RIGHT/LEFT HEART CATH AND CORONARY ANGIOGRAPHY;  Surgeon: Elder Negus, MD;  Location: [facility];  Service: Cardiovascular;  Laterality: N/A;   stem cell infusion  1998   surgery for crossed eyes     TEE WITHOUT CARDIOVERSION N/A 10/14/2022   Procedure: TRANSESOPHAGEAL ECHOCARDIOGRAM;  Surgeon: Elder Negus, MD;  Location: [facility];  Service: Cardiovascular;  Laterality: N/A;   TONSILLECTOMY  1961   tran flap R breast Right 1998   tri-port  1998   TUBAL LIGATION      Family History  Problem Relation Age of Onset   Dementia Mother    Prostate cancer Father    Arrhythmia Sister    Hypertension  Sister    Breast cancer Paternal Aunt    Hypertension Maternal Grandmother    Heart Problems Maternal Grandfather    Diabetes Paternal Grandmother    Heart Problems Paternal Grandmother    Blindness Paternal Grandfather    Neuropathy Neg Hx     Social History   Socioeconomic History   Marital status: Married    Spouse name: Not on file   Number of children: 4   Years of education: Not on file   Highest education level: Some college, no degree  Occupational History   Occupation: Semi-retired working for [employer]  Tobacco Use   Smoking status: Former    Current packs/day: 0.00    Average packs/day: 1 pack/day for 36.0 years (36.0 ttl pk-yrs)    Types: Cigarettes    Start date: 45    Quit date: 1998    Years since quitting: 26.6   Smokeless tobacco: Never   Tobacco comments:    At first maybe 1 pack per week then it moved up to 1 ppd later.  Vaping Use   Vaping status: Never Used  Substance and Sexual Activity   Alcohol use: Yes    Comment: 2-3 drinks per week seldom   Drug use: Never   Sexual activity: Not on file  Other Topics Concern   Not on file  Social History Narrative   Lives at home with spouse   Right handed   Caffeine: coffee or tea, 2/day       2 deceased sons , 1 son passed from heart attack   Social Determinants of Health   Financial Resource Strain: Not on file  Food Insecurity: Not on file  Transportation Needs: Not on file  Physical Activity: Not on file  Stress: Not on file  Social Connections: Not on file  Intimate Partner Violence: Not on file    Prior to Admission medications   Medication Sig Start Date End Date Taking? Authorizing Provider  albuterol (VENTOLIN HFA) 108 (90 Base) MCG/ACT inhaler Inhale 2 puffs into the lungs every 4 (four) hours as needed for wheezing or shortness of breath. 04/13/18  Yes [provider]  Ascorbic Acid (VITAMIN C) 100 MG tablet Take 100 mg by mouth daily.   Yes [provider]   B Complex Vitamins (B COMPLEX PO) Take 1 capsule by mouth daily.   Yes [provider]  Cholecalciferol (VITAMIN D3) 125 MCG (5000 UT) TABS Take 5,000 Units by mouth daily.   Yes [provider]  lisinopril-hydrochlorothiazide (ZESTORETIC) 20-12.5 MG tablet Take 1 tablet by mouth daily.   Yes [provider]  Magnesium 250 MG TABS Take 250 mg by mouth daily as needed (Cramps).   Yes [provider]  Multiple Vitamin (MULTIVITAMINS PO) Take 1 tablet by mouth daily.   Yes [provider]  Multiple Vitamins-Minerals (ZINC PO) Take 10 mg by mouth  daily.   Yes [provider]  naproxen sodium (ALEVE) 220 MG tablet Take 440 mg by mouth daily.   Yes [provider]  OZEMPIC, 0.25 OR 0.5 MG/DOSE, 2 MG/3ML SOPN Inject 0.25 mg into the skin every Saturday.   Yes [provider]  pantoprazole (PROTONIX) 40 MG tablet Take 40 mg by mouth daily.   Yes [provider]  polyvinyl alcohol (LIQUIFILM TEARS) 1.4 % ophthalmic solution Place 1 drop into both eyes as needed for dry eyes.   Yes [provider]  solifenacin (VESICARE) 5 MG tablet Take 5 mg by mouth daily. 11/06/22  Yes [provider]  Specialty Vitamins Products (COLLAGEN ULTRA PO) Take 1 capsule by mouth daily as needed (Unknown).   Yes [provider]  torsemide (DEMADEX) 10 MG tablet Take 1 tablet (10 mg total) by mouth every morning. 11/18/22  Yes Tolia, Sunit, DO    Current Outpatient Medications  Medication Sig Dispense Refill   albuterol (VENTOLIN HFA) 108 (90 Base) MCG/ACT inhaler Inhale 2 puffs into the lungs every 4 (four) hours as needed for wheezing or shortness of breath.     Ascorbic Acid (VITAMIN C) 100 MG tablet Take 100 mg by mouth daily.     B Complex Vitamins (B COMPLEX PO) Take 1 capsule by mouth daily.     Cholecalciferol (VITAMIN D3) 125 MCG (5000 UT) TABS Take 5,000 Units by mouth daily.     lisinopril-hydrochlorothiazide  (ZESTORETIC) 20-12.5 MG tablet Take 1 tablet by mouth daily.     Magnesium 250 MG TABS Take 250 mg by mouth daily as needed (Cramps).     Multiple Vitamin (MULTIVITAMINS PO) Take 1 tablet by mouth daily.     Multiple Vitamins-Minerals (ZINC PO) Take 10 mg by mouth daily.     naproxen sodium (ALEVE) 220 MG tablet Take 440 mg by mouth daily.     OZEMPIC, 0.25 OR 0.5 MG/DOSE, 2 MG/3ML SOPN Inject 0.25 mg into the skin every Saturday.     pantoprazole (PROTONIX) 40 MG tablet Take 40 mg by mouth daily.     polyvinyl alcohol (LIQUIFILM TEARS) 1.4 % ophthalmic solution Place 1 drop into both eyes as needed for dry eyes.     solifenacin (VESICARE) 5 MG tablet Take 5 mg by mouth daily.     Specialty Vitamins Products (COLLAGEN ULTRA PO) Take 1 capsule by mouth daily as needed (Unknown).     torsemide (DEMADEX) 10 MG tablet Take 1 tablet (10 mg total) by mouth every morning. 90 tablet 3   No current facility-administered medications for this visit.    Allergies  Allergen Reactions   Penicillin G Anaphylaxis    Other Reaction(s): dry mouth  Other Reaction(s): Rash/SOB   Penicillins Rash and Shortness Of Breath    Other Reaction(s): rash/sob   Bee Venom Swelling    Bee stings   Morphine Other (See Comments) and Nausea And Vomiting      Review of Systems:   General:  normal appetite, + decreased energy, no weight gain, no weight loss, no fever  Cardiac:  no chest pain with exertion, no chest pain at rest, +SOB with mild exertion, no resting SOB, no PND, no orthopnea, no palpitations, no arrhythmia, no atrial fibrillation, + LE edema, + dizzy spells, no syncope  Respiratory:  + exertional shortness of breath, no home oxygen, no productive cough, no dry cough, no bronchitis, no wheezing, no hemoptysis, no asthma, no pain with inspiration or cough, no sleep apnea,  no CPAP at night  GI:   no difficulty swallowing, no reflux, no frequent heartburn, no hiatal hernia, no abdominal pain, no  constipation, no diarrhea, no hematochezia, no hematemesis, no melena  GU:   no dysuria,  no frequency, no urinary tract infection, no hematuria, no kidney stones, no kidney disease  Vascular:  no pain suggestive of claudication, no pain in feet, no leg cramps, no varicose veins, no DVT, no non-healing foot ulcer  Neuro:   no stroke, no TIA's, no seizures, no headaches, no temporary blindness one eye,  no slurred speech, no peripheral neuropathy, no chronic pain, no instability of gait, no memory/cognitive dysfunction  Musculoskeletal: + arthritis, no joint swelling, no myalgias, no difficulty walking, normal mobility   Skin:   no rash, no itching, no skin infections, no pressure sores or ulcerations  Psych:   no anxiety, no depression, no nervousness, no unusual recent stress  Eyes:   no blurry vision, no floaters, no recent vision changes, + wears glasses   ENT:   no hearing loss, no loose or painful teeth, + dentures,  Hematologic:  no easy bruising, no abnormal bleeding, no clotting disorder, no frequent epistaxis  Endocrine:  + diabetes, does check CBG's at home     Physical Exam:   BP 135/76   Pulse 81   Resp 20   Ht 5\' 4"  (1.626 m)   Wt 214 lb (97.1 kg)   SpO2 94% Comment: RA  BMI 36.73 kg/m   General:  well-appearing  HEENT:  Unremarkable, NCAT, PERLA, EOMI  Neck:   no JVD, no bruits, no adenopathy   Chest:   clear to auscultation, symmetrical breath sounds, no wheezes, no rhonchi   CV:   RRR, 3/6 systolic murmur RSB, no diastolic murmur  Abdomen:  soft, non-tender, no masses   Extremities:  warm, well-perfused, pedal pulses palpable, mild bilateral lower extremity edema  Rectal/GU  Deferred  Neuro:   Grossly non-focal and symmetrical throughout  Skin:   Clean and dry, no rashes, no breakdown  Diagnostic Tests:  TRANSESOPHOGEAL ECHO REPORT       Patient Name:   IZUMI CUADRADO Date of Exam: 10/14/2022  Medical Rec #:  [id number]     Height:       64.0 in  Accession #:     [id number]    Weight:       218.0 lb  Date of Birth:  [birth year]     BSA:          2.029 m  Patient Age:    77 years      BP:           145/91 mmHg  Patient Gender: F             HR:           78 bpm.  Exam Location:  Inpatient   Procedure: Transesophageal Echo, Cardiac Doppler and Color Doppler   Indications:    Nonrheumatic aortic valve stenosis I35    History:        Patient has prior history of Echocardiogram examinations,  most                 recent 09/13/2022. Aortic Valve Disease.    Sonographer:    Dondra Prader RVT RCS  Referring Phys: [id number] [facility] J PATWARDHAN   PROCEDURE: After discussion of the risks and benefits of a TEE, an  informed consent was obtained from the patient. The transesophogeal probe  was passed without difficulty through the esophogus of the patient. Local  oropharyngeal anesthetic was provided  with viscous lidocaine. Sedation performed by different physician. The  patient developed no complications during the procedure.    IMPRESSIONS     1. Left ventricular ejection fraction, by estimation, is 55 to 60%. The  left ventricle has normal function.   2. Right ventricular systolic function is normal. The right ventricular  size is normal.   3. No left atrial/left atrial appendage thrombus was detected.   4. The mitral valve is normal in structure. Mild to moderate mitral valve  regurgitation. No evidence of mitral stenosis.   5. Tricuspid valve regurgitation is mild to moderate.   6. Aortic stenosis severe by AVA, moderate by gradients, velocity, as  well as dimensionless index 0.28. Marland Kitchen The aortic valve is calcified. Aortic  valve regurgitation is not visualized. Aortic valve area, by VTI measures  0.81 cm. Aortic valve mean  gradient measures 31.0 mmHg. Aortic valve Vmax measures 3.57 m/s.   7. There is mild (Grade II) plaque.   FINDINGS   Left Ventricle: Left ventricular ejection fraction, by estimation, is 55  to 60%. The left ventricle has  normal function. The left ventricular  internal cavity size was normal in size.   Right Ventricle: The right ventricular size is normal. No increase in  right ventricular wall thickness. Right ventricular systolic function is  normal.   Left Atrium: Left atrial size was normal in size. No left atrial/left  atrial appendage thrombus was detected.   Right Atrium: Right atrial size was normal in size.   Pericardium: There is no evidence of pericardial effusion.   Mitral Valve: The mitral valve is normal in structure. Mild to moderate  mitral valve regurgitation. No evidence of mitral valve stenosis.   Tricuspid Valve: The tricuspid valve is normal in structure. Tricuspid  valve regurgitation is mild to moderate. No evidence of tricuspid  stenosis.   Aortic Valve: Aortic stenosis severe by AVA, moderate by gradients,  velocity, as well as dimensionless index 0.28. The aortic valve is  calcified. Aortic valve regurgitation is not visualized. Aortic valve mean  gradient measures 31.0 mmHg. Aortic valve  peak gradient measures 51.0 mmHg. Aortic valve area, by VTI measures 0.81  cm.   Pulmonic Valve: The pulmonic valve was normal in structure. Pulmonic valve  regurgitation is not visualized. No evidence of pulmonic stenosis.   Aorta: The aortic root is normal in size and structure. There is mild  (Grade II) plaque.   IAS/Shunts: No atrial level shunt detected by color flow Doppler.     LEFT VENTRICLE  PLAX 2D  LVOT diam:     1.90 cm  LV SV:         64  LV SV Index:   32  LVOT Area:     2.84 cm     AORTIC VALVE  AV Area (Vmax):    0.79 cm  AV Area (Vmean):   0.72 cm  AV Area (VTI):     0.81 cm  AV Vmax:           357.00 cm/s  AV Vmean:          255.500 cm/s  AV VTI:            0.791 m  AV Peak Grad:      51.0 mmHg  AV Mean Grad:      31.0 mmHg  LVOT Vmax:  100.00 cm/s  LVOT Vmean:        65.000 cm/s  LVOT VTI:          0.226 m  LVOT/AV VTI ratio: 0.29      SHUNTS  Systemic VTI:  0.23 m  Systemic Diam: 1.90 cm   Truett Mainland MD  Electronically signed by Truett Mainland MD  Signature Date/Time: 10/14/2022/11:24:53 AM        Final      Physicians  Panel Physicians Referring Physician Case Authorizing Physician  Patwardhan, Anabel Bene, MD (Primary)     Procedures  RIGHT/LEFT HEART CATH AND CORONARY ANGIOGRAPHY   Conclusion      LV end diastolic pressure is mildly elevated.   No indication for antiplatelet therapy at this time .   LM: Normal. No significant disease LAD: Normal. No significant disease Lcx: Dominant. No significant disease RCA: Nondominant. No significant disease   RA: 8 mmHg RV: 49/6 mmHg PA:46/25 mmHg, mPAP 31 mmHg PCW: 17 mmHg   CO: 5.3 L.min CI: 2.6 L/min/m2   Simultaneous LV-AO measurement using Langston cathteer Aortic valve mean PG 25 mmHg, AVA 0.94 cm2   Conclusion: No significant coronary artery disease Mildly elevated filling pressures Mild PH, WHO Grp II Aortic stenosis, moderate by gradients, severe by area. In the correct context, aortic stenosis could possibly be paradoxically low flow low gradient. Alternatively, dyspnea could be due to mild PH, possible diastolic dysfunction. Recommend clinical correlation.    Elder Negus, MD Pager: [phone] Office: [phone]     Recommendations  Antiplatelet/Anticoag No indication for antiplatelet therapy at this time .   Surgeon Notes    10/14/2022  8:36 AM CV Procedure signed by Elder Negus, MD   Indications  Nonrheumatic aortic valve stenosis [I35.0 (ICD-10-CM)]   Procedural Details  Technical Details Procedures: 1.  Ultrasound-guided right common femoral artery, and right common femoral vein access. 2. Right heart catheterization 3. Left heart catheterization 4. Selective left and right coronary angiography 5. Conscious sedation monitoring 56 min  Indication: Exertional dyspnea Aortic  stenosis Abnormal stress test  History: 77 year old Caucasian female with hypertension,  aortic stenosis, ?h/o ventricular tachycardia,h/o breast cancerX2 (s/p bilateral mastectomy, radiation, chemotherapy), now with exertional dyspnea, abnormal stress test   Diagnostic Angiography: Catheter/s advances over guidewire under fluoroscopy Left coronary artery: 5 Fr JL 4  Right coronary artery: 5 Fr JR 4 Left heart catheterization: 6 Fr Langston   Pressures tracings obtained in right atrium, right ventricle, pulmonary artery, and pulmonary capillary wedge position. Invasive hemodynamic measurements performed using Fick method.     Anticoagulation:  None  Hemostasis: Manual pressure  Total contrast used: 45 cc   Total fluoro time: 10.6 min Air Kerma: 227 mGy  Conscious sedation was administered under my direct supervision. IV Versed 1 mg and fentanyl 25 mcg administered. Continuous ECG, pulse oximetry and blood pressure were monitored throughout the entire procedure. Monitoring corroborated by cath lab nurse and technician.  Total sedation time: 56 minutes.  All wires and catheters removed out of the body at the end of the procedure Final angiogram showed no dissection/perforation.   [image] Elder Negus, MD Pager: [phone] Office: [phone]          Estimated blood loss <50 mL.   During this procedure medications were administered to achieve and maintain moderate conscious sedation while the patient's heart rate, blood pressure, and oxygen saturation were continuously monitored and I was present face-to-face 100% of this time.   Medications (Filter: Administrations  occurring from 0922 to 1036 on 10/14/22)  important  Continuous medications are totaled by the amount administered until 10/14/22 1036.   Heparin (Porcine) in NaCl 1000-0.9 UT/500ML-% SOLN (mL)  Total volume: 1,000 mL Date/Time Rate/Dose/Volume Action   10/14/22 0934 500 mL Given    0934 500 mL Given   fentaNYL (SUBLIMAZE) injection (mcg)  Total dose: 25 mcg Date/Time Rate/Dose/Volume Action   10/14/22 0935 25 mcg Given   midazolam (VERSED) injection (mg)  Total dose: 1 mg Date/Time Rate/Dose/Volume Action   10/14/22 0935 1 mg Given   lidocaine (PF) (XYLOCAINE) 1 % injection (mL)  Total volume: 10 mL Date/Time Rate/Dose/Volume Action   10/14/22 0940 10 mL Given   iohexol (OMNIPAQUE) 350 MG/ML injection (mL)  Total volume: 45 mL Date/Time Rate/Dose/Volume Action   10/14/22 1035 45 mL Given    Sedation Time  Sedation Time Physician-1: 56 minutes 31 seconds Contrast     Administrations occurring from 0922 to 1036 on 10/14/22:  Medication Name Total Dose  iohexol (OMNIPAQUE) 350 MG/ML injection 45 mL   Radiation/Fluoro  Fluoro time: 10.6 (min) DAP: 15466 (mGycm2) Cumulative Air Kerma: 227 (mGy) Complications  Complications documented before study signed (10/14/2022 10:56 AM)   No complications were associated with this study.  Documented by Ledell Peoples, RN - 10/14/2022 10:36 AM     Coronary Findings  Diagnostic Dominance: Left Left Main  Vessel is normal in caliber. Vessel is angiographically normal.    Left Anterior Descending  Vessel is normal in caliber. Vessel is angiographically normal.    Left Circumflex  Vessel is normal in caliber. Vessel is angiographically normal.    Right Coronary Artery  Vessel is small. Vessel is angiographically normal.    Intervention   No interventions have been documented.   Right Heart  Right Heart Pressures RA: 8 mmHg RV: 49/6 mmHg PA:46/25 mmHg, mPAP 31 mmHg PCW: 17 mmHg  CO: 5.3 L.min CI: 2.6 L/min/m2   Left Heart  Left Ventricle LV end diastolic pressure is mildly elevated. Aortic valve mean PG 25 mmHg, AVA 0.94 cm2   Coronary Diagrams  Diagnostic Dominance: Left  Intervention   Implants   No implant documentation for this case.   Syngo Images   Show images for CARDIAC  CATHETERIZATION Images on Long Term Storage   Show images for Nickey, Creasey to Procedure Log  Procedure Log   Link to Procedure Log  Procedure Log    Hemo Data  Flowsheet Row Most Recent Value  Fick Cardiac Output 5.29 L/min  Fick Cardiac Output Index 2.61 (L/min)/BSA  Aortic Mean Gradient 25.18 mmHg  Aortic Peak Gradient 29.1 mmHg  Aortic Valve Area 0.94  Aortic Value Area Index 0.46 cm2/BSA  RA A Wave 13 mmHg  RA V Wave 10 mmHg  RA Mean 8 mmHg  RV Systolic Pressure 49 mmHg  RV Diastolic Pressure 6 mmHg  RV EDP 12 mmHg  PA Systolic Pressure 46 mmHg  PA Diastolic Pressure 25 mmHg  PA Mean 31 mmHg  PW A Wave 21 mmHg  PW V Wave 23 mmHg  PW Mean 17 mmHg  AO Systolic Pressure 164 mmHg  AO Diastolic Pressure 74 mmHg  AO Mean 110 mmHg  LV Systolic Pressure 193 mmHg  LV Diastolic Pressure 14 mmHg  LV EDP 26 mmHg  AOp Systolic Pressure 161 mmHg  AOp Diastolic Pressure 73 mmHg  AOp Mean Pressure 110 mmHg  LVp Systolic Pressure 179 mmHg  LVp Diastolic Pressure 20 mmHg  LVp EDP Pressure 31 mmHg  QP/QS 1  TPVR Index 11.9 HRUI  TSVR Index 43.75 HRUI  PVR SVR Ratio 0.13  TPVR/TSVR Ratio 0.27    Addendum  ADDENDUM REPORT: 11/11/2022 13:36   EXAM: OVER-READ INTERPRETATION  CT CHEST   The following report is an over-read performed by radiologist Dr. Jacob Moores [facility] on 11/11/2022. This over-read does not include interpretation of cardiac or coronary anatomy or pathology. The cardiac TAVR interpretation by the cardiologist is attached.   COMPARISON:  None.   FINDINGS: Extracardiac findings will be described separately under dictation for contemporaneously obtained CTA chest, abdomen and pelvis.   IMPRESSION: Please see separate dictation for contemporaneously obtained CTA chest, abdomen and pelvis dated 11/11/2022 for full description of relevant extracardiac findings.     Electronically Signed   By: Allegra Lai M.D.    On: 11/11/2022 13:36    Addended by Renford Dills, MD on 11/11/2022  1:38 PM    Study Result  Narrative & Impression  CLINICAL DATA:  Severe Aortic Stenosis.   EXAM: Cardiac TAVR CT   TECHNIQUE: A non-contrast, gated CT scan was obtained with axial slices of 3 mm through the heart for aortic valve calcium scoring. A 100 kV retrospective, gated, contrast cardiac scan was obtained. Gantry rotation speed was 250 msecs and collimation was 0.6 mm. Nitroglycerin was not given. The 3D data set was reconstructed in 5% intervals of the 0-95% of the R-R cycle. Systolic and diastolic phases were analyzed on a dedicated workstation using MPR, MIP, and VRT modes. The patient received 100 cc of contrast.   FINDINGS: Image quality: Excellent.   Noise artifact is: Limited.   Valve Morphology: Tricuspid aortic valve with diffuse severe calcifications. Bulky calcification of the RCC/NCC. Restricted leaflet movement in systole.   Aortic Valve Calcium score: 1952   Aortic annular dimension:   Phase assessed: 25%   Annular area: 464 mm2   Annular perimeter: 77.0 mm   Max diameter: 26.6 mm   Min diameter: 22.9 mm   Annular and subannular calcification: No annular calcium. There is moderate subannular calcium under the LCC extending to the AMVL.   Membranous septum length: 5.7 mm   Optimal coplanar projection: LAO 17 CRA 2   Coronary Artery Height above Annulus:   Left Main: 11.7 mm   Right Coronary: 18.7 mm   Sinus of Valsalva Measurements:   Non-coronary: 31 mm   Right-coronary: 29 mm   Left-coronary: 32 mm   Sinus of Valsalva Height:   Non-coronary: 20.3 mm   Right-coronary: 21.7 mm   Left-coronary: 21.6 mm   Sinotubular Junction: 28 mm   Ascending Thoracic Aorta: 33 mm   Coronary Arteries: Normal coronary origin. Left dominance. The study was performed without use of NTG and is insufficient for plaque evaluation. Please refer to recent cardiac  catheterization for coronary assessment.   Cardiac Morphology:   Right Atrium: Right atrial size is within normal limits. Contrast reflux into the IVC consistent with elevated RA pressure.   Right Ventricle: The right ventricular cavity is within normal limits.   Left Atrium: Left atrial size is normal in size with no left atrial appendage filling defect.   Left Ventricle: The ventricular cavity size is within normal limits.   Pulmonary arteries: Dilated pulmonary artery suggestive of pulmonary hypertension.   Pulmonary veins: Normal pulmonary venous drainage.   Pericardium: Normal thickness with no significant effusion or calcium present.   Mitral Valve: The mitral valve is  degenerative with moderate annular calcium of the AMVL that extends into the LVOT.   Extra-cardiac findings: See attached radiology report for non-cardiac structures.   IMPRESSION: 1. Annular measurements support a 26 mm S3 or 29 mm Evolut Pro.   2. There is moderate subannular calcium under the LCC extending to the AMVL.   3. Sufficient coronary to annulus distance.   4. Optimal Fluoroscopic Angle for Delivery: LAO 17 CRA 2   5. Dilated pulmonary artery suggestive of pulmonary hypertension.   Gerri Spore T. Flora Lipps, MD   Electronically Signed: By: Lennie Odor M.D. On: 11/11/2022 13:27       Narrative & Impression  CLINICAL DATA:  TAVR preop evaluation   EXAM: CT ANGIOGRAPHY CHEST, ABDOMEN AND PELVIS   TECHNIQUE: Multidetector CT imaging through the chest, abdomen and pelvis was performed using the standard protocol during bolus administration of intravenous contrast. Multiplanar reconstructed images and MIPs were obtained and reviewed to evaluate the vascular anatomy.   RADIATION DOSE REDUCTION: This exam was performed according to the departmental dose-optimization program which includes automated exposure control, adjustment of the mA and/or kV according to patient size and/or use  of iterative reconstruction technique.   CONTRAST:  OMNIPAQUE IOHEXOL 350 MG/ML SOLN   COMPARISON:  None Available.   FINDINGS: CTA CHEST FINDINGS   Cardiovascular: Heart is upper limits of normal in size. No pericardial effusion. Aortic valve thickening and calcifications. Mitral annular calcifications. Normal caliber thoracic aorta with moderate atherosclerotic disease. Dilated main pulmonary artery, measuring up to 3.7 cm. Left main and three-vessel coronary artery calcifications.   Mediastinum/Nodes: Small hiatal hernia. Thyroid is unremarkable. Surgical clips of the right axilla. No enlarged lymph nodes seen in the chest.   Lungs/Pleura: Central airways are patent. No consolidation, pleural effusion or pneumothorax.   Musculoskeletal: Postsurgical changes of the right breast. Left breast implant. No aggressive appearing osseous lesions.   CTA ABDOMEN AND PELVIS FINDINGS   Hepatobiliary: No focal liver abnormality is seen. Cholelithiasis. Evidence of gallbladder wall thickening. No biliary ductal dilation.   Pancreas: Unremarkable. No pancreatic ductal dilatation or surrounding inflammatory changes.   Spleen: Normal in size without focal abnormality.   Adrenals/Urinary Tract: Bilateral adrenal glands are unremarkable. No hydronephrosis or nephrolithiasis. Bladder is unremarkable.   Stomach/Bowel: Stomach is within normal limits. Appendix appears normal. No evidence of bowel wall thickening, distention, or inflammatory changes.   Vascular/lymphatic: Normal caliber abdominal aorta with moderate atherosclerotic disease. No pathologically enlarged lymph nodes seen in the abdomen or pelvis.   Reproductive: No adnexal mass.   Other: Small bilateral fat containing inguinal hernias. No abdominopelvic ascites.   Musculoskeletal: No acute or significant osseous findings.   VASCULAR MEASUREMENTS PERTINENT TO TAVR:   AORTA:   Minimal Aortic Diameter -  12.0  mm   Severity of Aortic Calcification-moderate   RIGHT PELVIS:   Right Common Iliac Artery -   Minimal Diameter-7.3 mm   Tortuosity-mild   Calcification-mild   Right External Iliac Artery -   Minimal Diameter-5.9 mm   Tortuosity-moderate   Calcification-mild   Right Common Femoral Artery -   Minimal Diameter-6.5 mm   Tortuosity-none   Calcification-none   LEFT PELVIS:   Left Common Iliac Artery -   Minimal Diameter-7.5 mm   Tortuosity-none   Calcification-moderate   Left External Iliac Artery -   Minimal Diameter-5.7 mm   Tortuosity-moderate   Calcification-mild   Left Common Femoral Artery -   Minimal Diameter-7.0 mm   Tortuosity-mild   Calcification-none  Review of the MIP images confirms the above findings.   IMPRESSION: 1. Vascular findings and measurements pertinent to potential TAVR procedure, as detailed above. 2. Thickening and calcification of the aortic valve, compatible with reported clinical history of aortic stenosis. 3. Moderate aortoiliac atherosclerosis. Left main and 3 vessel coronary artery disease. 4. Dilated main pulmonary artery, findings can be seen in the setting of pulmonary hypertension.     Electronically Signed   By: Allegra Lai M.D.   On: 11/11/2022 12:14      Impression:  This 77 year old woman has stage D3, low-flow/low gradient severe aortic stenosis with NYHA class III symptoms of exertional fatigue and shortness of breath consistent with chronic diastolic congestive heart failure.  She has also been having episodes of dizziness and presyncope as well as fullness in her epigastrium.  I have personally reviewed her TEE, cardiac catheterization, and CTA studies.  Her TEE shows a severely calcified aortic valve with leaflet thickening and restricted mobility.  The mean gradient was measured at 31 mmHg with a valve area 0.81 cm consistent with severe aortic stenosis.  Stroke-volume index was low at 32.   Left ventricular systolic function was normal.  Cardiac catheterization showed no coronary disease with an aortic valve simultaneous mean gradient of 25 mmHg with a valve area of 0.94 cm consistent with severe low-flow/low gradient aortic stenosis. I agree that aortic valve replacement is indicated in this patient for relief of her worsening symptoms and to prevent progressive left ventricular deterioration.  Given her age and comorbidities including prior bilateral breast cancer and reconstructions with radiation therapy to her chest I think that transcatheter aortic valve replacement would be the best treatment option for her.  Her gated cardiac CTA shows anatomy suitable for TAVR using a 26 mm SAPIEN 3 valve.  Her abdominal and pelvic CTA shows adequate pelvic vascular anatomy to allow transfemoral insertion.  The patient and her husband were counseled at length regarding treatment alternatives for management of severe symptomatic aortic stenosis. The risks and benefits of surgical intervention has been discussed in detail. Long-term prognosis with medical therapy was discussed. Alternative approaches such as conventional surgical aortic valve replacement, transcatheter aortic valve replacement, and palliative medical therapy were compared and contrasted at length. This discussion was placed in the context of the patient's own specific clinical presentation and past medical history. All of their questions have been addressed.   Following the decision to proceed with transcatheter aortic valve replacement, a discussion was held regarding what types of management strategies would be attempted intraoperatively in the event of life-threatening complications, including whether or not the patient would be considered a candidate for the use of cardiopulmonary bypass and/or conversion to open sternotomy for attempted surgical intervention.  I think she would be a candidate for emergent sternotomy to manage any  intraoperative complications.  The patient is aware of the fact that transient use of cardiopulmonary bypass may be necessary. The patient has been advised of a variety of complications that might develop including but not limited to risks of death, stroke, paravalvular leak, aortic dissection or other major vascular complications, aortic annulus rupture, device embolization, cardiac rupture or perforation, mitral regurgitation, acute myocardial infarction, arrhythmia, heart block or bradycardia requiring permanent pacemaker placement, congestive heart failure, respiratory failure, renal failure, pneumonia, infection, other late complications related to structural valve deterioration or migration, or other complications that might ultimately cause a temporary or permanent loss of functional independence or other long term morbidity. The patient provides full  informed consent for the procedure as described and all questions were answered.      Plan:  She will be scheduled for transfemoral TAVR using a SAPIEN 3 valve on Tuesday, 12/02/2022.  I spent 60 minutes performing this consultation and > 50% of this time was spent face to face counseling and coordinating the care of this patient's severe symptomatic aortic stenosis.   Alleen Borne, MD 11/26/2022

## 2022-12-01 ENCOUNTER — Encounter (HOSPITAL_COMMUNITY)
Admission: RE | Admit: 2022-12-01 | Discharge: 2022-12-01 | Disposition: A | Discharge status: Home / Self Care | Payer: Medicare HMO | Source: Ambulatory Visit | Attending: Internal Medicine | Admitting: Internal Medicine

## 2022-12-01 ENCOUNTER — Ambulatory Visit (HOSPITAL_COMMUNITY): Admission: RE | Admit: 2022-12-01 | Payer: Medicare HMO | Source: Ambulatory Visit

## 2022-12-01 ENCOUNTER — Other Ambulatory Visit: Payer: Self-pay

## 2022-12-01 DIAGNOSIS — Z9221 Personal history of antineoplastic chemotherapy: Secondary | ICD-10-CM | POA: Diagnosis not present

## 2022-12-01 DIAGNOSIS — I083 Combined rheumatic disorders of mitral, aortic and tricuspid valves: Secondary | ICD-10-CM | POA: Insufficient documentation

## 2022-12-01 DIAGNOSIS — I119 Hypertensive heart disease without heart failure: Secondary | ICD-10-CM | POA: Insufficient documentation

## 2022-12-01 DIAGNOSIS — Z8249 Family history of ischemic heart disease and other diseases of the circulatory system: Secondary | ICD-10-CM | POA: Diagnosis not present

## 2022-12-01 DIAGNOSIS — E119 Type 2 diabetes mellitus without complications: Secondary | ICD-10-CM | POA: Diagnosis not present

## 2022-12-01 DIAGNOSIS — Z9012 Acquired absence of left breast and nipple: Secondary | ICD-10-CM | POA: Insufficient documentation

## 2022-12-01 DIAGNOSIS — Z1152 Encounter for screening for COVID-19: Secondary | ICD-10-CM | POA: Diagnosis not present

## 2022-12-01 DIAGNOSIS — I272 Pulmonary hypertension, unspecified: Secondary | ICD-10-CM | POA: Insufficient documentation

## 2022-12-01 DIAGNOSIS — E559 Vitamin D deficiency, unspecified: Secondary | ICD-10-CM | POA: Diagnosis present

## 2022-12-01 DIAGNOSIS — Z803 Family history of malignant neoplasm of breast: Secondary | ICD-10-CM | POA: Diagnosis not present

## 2022-12-01 DIAGNOSIS — I110 Hypertensive heart disease with heart failure: Secondary | ICD-10-CM | POA: Diagnosis not present

## 2022-12-01 DIAGNOSIS — R509 Fever, unspecified: Secondary | ICD-10-CM | POA: Diagnosis not present

## 2022-12-01 DIAGNOSIS — I371 Nonrheumatic pulmonary valve insufficiency: Secondary | ICD-10-CM | POA: Diagnosis not present

## 2022-12-01 DIAGNOSIS — J45909 Unspecified asthma, uncomplicated: Secondary | ICD-10-CM | POA: Diagnosis not present

## 2022-12-01 DIAGNOSIS — Z853 Personal history of malignant neoplasm of breast: Secondary | ICD-10-CM | POA: Insufficient documentation

## 2022-12-01 DIAGNOSIS — I452 Bifascicular block: Secondary | ICD-10-CM | POA: Diagnosis present

## 2022-12-01 DIAGNOSIS — Z954 Presence of other heart-valve replacement: Secondary | ICD-10-CM | POA: Diagnosis not present

## 2022-12-01 DIAGNOSIS — Z87891 Personal history of nicotine dependence: Secondary | ICD-10-CM | POA: Insufficient documentation

## 2022-12-01 DIAGNOSIS — Z9481 Bone marrow transplant status: Secondary | ICD-10-CM | POA: Insufficient documentation

## 2022-12-01 DIAGNOSIS — Z952 Presence of prosthetic heart valve: Secondary | ICD-10-CM | POA: Diagnosis not present

## 2022-12-01 DIAGNOSIS — I491 Atrial premature depolarization: Secondary | ICD-10-CM | POA: Insufficient documentation

## 2022-12-01 DIAGNOSIS — R9389 Abnormal findings on diagnostic imaging of other specified body structures: Secondary | ICD-10-CM | POA: Diagnosis not present

## 2022-12-01 DIAGNOSIS — Z6835 Body mass index (BMI) 35.0-35.9, adult: Secondary | ICD-10-CM | POA: Diagnosis not present

## 2022-12-01 DIAGNOSIS — Z923 Personal history of irradiation: Secondary | ICD-10-CM | POA: Diagnosis not present

## 2022-12-01 DIAGNOSIS — K219 Gastro-esophageal reflux disease without esophagitis: Secondary | ICD-10-CM | POA: Diagnosis present

## 2022-12-01 DIAGNOSIS — Z821 Family history of blindness and visual loss: Secondary | ICD-10-CM | POA: Diagnosis not present

## 2022-12-01 DIAGNOSIS — Z01818 Encounter for other preprocedural examination: Secondary | ICD-10-CM | POA: Insufficient documentation

## 2022-12-01 DIAGNOSIS — I2510 Atherosclerotic heart disease of native coronary artery without angina pectoris: Secondary | ICD-10-CM | POA: Insufficient documentation

## 2022-12-01 DIAGNOSIS — I5033 Acute on chronic diastolic (congestive) heart failure: Secondary | ICD-10-CM | POA: Diagnosis not present

## 2022-12-01 DIAGNOSIS — Z6836 Body mass index (BMI) 36.0-36.9, adult: Secondary | ICD-10-CM | POA: Diagnosis not present

## 2022-12-01 DIAGNOSIS — I10 Essential (primary) hypertension: Secondary | ICD-10-CM | POA: Diagnosis not present

## 2022-12-01 DIAGNOSIS — I350 Nonrheumatic aortic (valve) stenosis: Secondary | ICD-10-CM | POA: Diagnosis not present

## 2022-12-01 DIAGNOSIS — E876 Hypokalemia: Secondary | ICD-10-CM | POA: Diagnosis present

## 2022-12-01 DIAGNOSIS — I2722 Pulmonary hypertension due to left heart disease: Secondary | ICD-10-CM | POA: Diagnosis not present

## 2022-12-01 DIAGNOSIS — Z006 Encounter for examination for normal comparison and control in clinical research program: Secondary | ICD-10-CM | POA: Diagnosis not present

## 2022-12-01 DIAGNOSIS — I358 Other nonrheumatic aortic valve disorders: Secondary | ICD-10-CM | POA: Diagnosis not present

## 2022-12-01 DIAGNOSIS — Z8042 Family history of malignant neoplasm of prostate: Secondary | ICD-10-CM | POA: Diagnosis not present

## 2022-12-01 DIAGNOSIS — Z833 Family history of diabetes mellitus: Secondary | ICD-10-CM | POA: Diagnosis not present

## 2022-12-01 LAB — URINALYSIS, ROUTINE W REFLEX MICROSCOPIC
Bacteria, UA: NONE SEEN
Bilirubin Urine: NEGATIVE
Glucose, UA: NEGATIVE mg/dL
Hgb urine dipstick: NEGATIVE
Ketones, ur: NEGATIVE mg/dL
Leukocytes,Ua: NEGATIVE
Nitrite: NEGATIVE
Protein, ur: NEGATIVE mg/dL
Specific Gravity, Urine: 1.004 — ABNORMAL LOW (ref 1.005–1.030)
pH: 5 (ref 5.0–8.0)

## 2022-12-01 LAB — PROTIME-INR
INR: 0.9 (ref 0.8–1.2)
Prothrombin Time: 12.3 seconds (ref 11.4–15.2)

## 2022-12-01 LAB — COMPREHENSIVE METABOLIC PANEL
ALT: 31 U/L (ref 0–44)
AST: 49 U/L — ABNORMAL HIGH (ref 15–41)
Albumin: 3.9 g/dL (ref 3.5–5.0)
Alkaline Phosphatase: 56 U/L (ref 38–126)
Anion gap: 15 (ref 5–15)
BUN: 15 mg/dL (ref 8–23)
CO2: 27 mmol/L (ref 22–32)
Calcium: 9.3 mg/dL (ref 8.9–10.3)
Chloride: 97 mmol/L — ABNORMAL LOW (ref 98–111)
Creatinine, Ser: 0.85 mg/dL (ref 0.44–1.00)
GFR, Estimated: >60 mL/min (ref >60)
Glucose, Bld: 175 mg/dL — ABNORMAL HIGH (ref 70–99)
Potassium: 3.3 mmol/L — ABNORMAL LOW (ref 3.5–5.1)
Sodium: 139 mmol/L (ref 135–145)
Total Bilirubin: 0.6 mg/dL (ref 0.3–1.2)
Total Protein: 7.1 g/dL (ref 6.5–8.1)

## 2022-12-01 LAB — CBC
HCT: 42.1 % (ref 36.0–46.0)
Hemoglobin: 13.9 g/dL (ref 12.0–15.0)
MCH: 27.6 pg (ref 26.0–34.0)
MCHC: 33 g/dL (ref 30.0–36.0)
MCV: 83.7 fL (ref 80.0–100.0)
Platelets: 175 10*3/uL (ref 150–400)
RBC: 5.03 MIL/uL (ref 3.87–5.11)
RDW: 14.8 % (ref 11.5–15.5)
WBC: 6 10*3/uL (ref 4.0–10.5)
nRBC: 0 % (ref 0.0–0.2)

## 2022-12-01 LAB — SURGICAL PCR SCREEN
MRSA, PCR: NEGATIVE
Staphylococcus aureus: POSITIVE — AB

## 2022-12-01 LAB — TYPE AND SCREEN
ABO/RH(D): A NEG
Antibody Screen: NEGATIVE

## 2022-12-01 LAB — SARS CORONAVIRUS 2 BY RT PCR: SARS Coronavirus 2 by RT PCR: NEGATIVE

## 2022-12-01 MED ORDER — POTASSIUM CHLORIDE 2 MEQ/ML IV SOLN
80.0000 meq | INTRAVENOUS | Status: DC
  Filled 2022-12-01 (×2): qty 40

## 2022-12-01 MED ORDER — HEPARIN 30,000 UNITS/1000 ML (OHS) CELLSAVER SOLUTION
Status: DC
  Filled 2022-12-01 (×2): qty 1000

## 2022-12-01 MED ORDER — MAGNESIUM SULFATE 50 % IJ SOLN
40.0000 meq | INTRAMUSCULAR | Status: DC
  Filled 2022-12-01 (×2): qty 9.85

## 2022-12-01 MED ORDER — NOREPINEPHRINE 4 MG/250ML-% IV SOLN
0.0000 ug/min | INTRAVENOUS | Status: DC
  Filled 2022-12-01: qty 250

## 2022-12-01 MED ORDER — DEXMEDETOMIDINE HCL IN NACL 400 MCG/100ML IV SOLN
0.1000 ug/kg/h | INTRAVENOUS | Status: AC
  Administered 2022-12-02: 1 ug/kg/h via INTRAVENOUS
  Filled 2022-12-01: qty 100

## 2022-12-01 MED ORDER — CEFAZOLIN SODIUM-DEXTROSE 2-4 GM/100ML-% IV SOLN
2.0000 g | INTRAVENOUS | Status: AC
  Administered 2022-12-02: 2 g via INTRAVENOUS
  Filled 2022-12-01 (×2): qty 100

## 2022-12-01 NOTE — H&P (Signed)
[address]       [place] [address]             [phone]      Cardiothoracic Surgery Admission History and Physical   PCP is Joycelyn Rua, MD Referring Provider is Verne Carrow, MD Primary Cardiologist is Tessa Lerner, DO   Reason for admission: Severe aortic stenosis   HPI:   The patient is a 77 year old woman with a history of hypertension, diabetes, remote smoking, ventricular tachycardia, right breast cancer in 1998 treated with right radical mastectomy with adjuvant chemotherapy and radiation therapy to the chest followed by bone marrow transplant and TRAM flap reconstruction, left breast ductal carcinoma in situ status post left mastectomy in 2007 with reconstructive surgery using an implant, and severe aortic stenosis who was referred for consideration of TAVR.  She had a 2D echo in May 2024 but it was not possible to determine the severity of her aortic stenosis due to poor windows related to her mastectomies and reconstructions.  She underwent a TEE on 10/14/2022 which showed a calcified aortic valve with a mean gradient of 31 mmHg and a peak of 51 mmHg with a valve area by VTI of 0.81 cm.  Dimensionless index was 0.28 with a stroke-volume index of 32.  Left ventricular ejection fraction was 55 to 60%.  She subsequent underwent cardiac catheterization showing no significant coronary disease.  The aortic valve simultaneous mean gradient was 25 mmHg with a valve area of 0.94 cm.  There is mild pulmonary hypertension at 46/25 with a mean of 31.  Pulmonary capillary wedge pressure was 17.   She is married and lives with her husband.  She reports development of progressive exertional shortness of breath fatigue and generalized weakness since March 2024.  She has had several episodes of dizziness but no syncope.  She said no substernal chest discomfort but has had discomfort in her epigastrium with exertion.  She has lower extremity edema which has not  improved on torsemide.       Past Medical History:  Diagnosis Date   Allergic rhinitis     Breast cancer, left (HCC) 2007    ductal carcinoma in situ, underwent left mastectomy 07/2005; reconstructive surgery with expander, permanent implant placed 03/2006, hospitalized January 2008 with breast cellulitis.   Breast cancer, right (HCC) 1998    1998; modified radical right mastectomy 1998 for infiltrating ductal adenocarcinoma with 7 of 12 axillary nodes positive. Underwent adjuvant chemotherapy and bone marrow transplant by Dr Greggory Stallion as well as radiation therapy. Also had Tram flap reconstruction.   Diabetes mellitus without complication (HCC)     History of ventricular tachycardia     Hypertension     Vitamin D deficiency                 Past Surgical History:  Procedure Laterality Date   biopsy on scalp   2010   breast implant Left 2007   CESAREAN SECTION       childbirth        63, 66, 67, 70   EYE SURGERY   1952   hysterectomy with one ovary/due to fibroids   1987   left mastectomy Left 2007   NASAL SEPTUM SURGERY   1975   PORTACATH PLACEMENT   1998   right mastectomy Right 1998   RIGHT/LEFT HEART CATH AND CORONARY ANGIOGRAPHY N/A 10/14/2022    Procedure: RIGHT/LEFT HEART CATH AND CORONARY ANGIOGRAPHY;  Surgeon: Truett Mainland  J, MD;  Location: [facility];  Service: Cardiovascular;  Laterality: N/A;   stem cell infusion   1998   surgery for crossed eyes       TEE WITHOUT CARDIOVERSION N/A 10/14/2022    Procedure: TRANSESOPHAGEAL ECHOCARDIOGRAM;  Surgeon: Elder Negus, MD;  Location: [facility];  Service: Cardiovascular;  Laterality: N/A;   TONSILLECTOMY   1961   tran flap R breast Right 1998   tri-port   1998   TUBAL LIGATION                   Family History  Problem Relation Age of Onset   Dementia Mother     Prostate cancer Father     Arrhythmia Sister     Hypertension Sister     Breast cancer Paternal Aunt     Hypertension Maternal  Grandmother     Heart Problems Maternal Grandfather     Diabetes Paternal Grandmother     Heart Problems Paternal Grandmother     Blindness Paternal Grandfather     Neuropathy Neg Hx            Social History         Socioeconomic History   Marital status: Married      Spouse name: Not on file   Number of children: 4   Years of education: Not on file   Highest education level: Some college, no degree  Occupational History   Occupation: Semi-retired working for [employer]  Tobacco Use   Smoking status: Former      Current packs/day: 0.00      Average packs/day: 1 pack/day for 36.0 years (36.0 ttl pk-yrs)      Types: Cigarettes      Start date: 51      Quit date: 1998      Years since quitting: 26.6   Smokeless tobacco: Never   Tobacco comments:      At first maybe 1 pack per week then it moved up to 1 ppd later.  Vaping Use   Vaping status: Never Used  Substance and Sexual Activity   Alcohol use: Yes      Comment: 2-3 drinks per week seldom   Drug use: Never   Sexual activity: Not on file  Other Topics Concern   Not on file  Social History Narrative    Lives at home with spouse    Right handed    Caffeine: coffee or tea, 2/day          2 deceased sons , 1 son passed from heart attack    Social Determinants of Health    Financial Resource Strain: Not on file  Food Insecurity: Not on file  Transportation Needs: Not on file  Physical Activity: Not on file  Stress: Not on file  Social Connections: Not on file  Intimate Partner Violence: Not on file             Prior to Admission medications   Medication Sig Start Date End Date Taking? Authorizing Provider  albuterol (VENTOLIN HFA) 108 (90 Base) MCG/ACT inhaler Inhale 2 puffs into the lungs every 4 (four) hours as needed for wheezing or shortness of breath. 04/13/18   Yes [provider]  Ascorbic Acid (VITAMIN C) 100 MG tablet Take 100 mg by mouth daily.     Yes [provider]  B  Complex Vitamins (B COMPLEX PO) Take 1 capsule by mouth daily.  Yes [provider]  Cholecalciferol (VITAMIN D3) 125 MCG (5000 UT) TABS Take 5,000 Units by mouth daily.     Yes [provider]  lisinopril-hydrochlorothiazide (ZESTORETIC) 20-12.5 MG tablet Take 1 tablet by mouth daily.     Yes [provider]  Magnesium 250 MG TABS Take 250 mg by mouth daily as needed (Cramps).     Yes [provider]  Multiple Vitamin (MULTIVITAMINS PO) Take 1 tablet by mouth daily.     Yes [provider]  Multiple Vitamins-Minerals (ZINC PO) Take 10 mg by mouth daily.     Yes [provider]  naproxen sodium (ALEVE) 220 MG tablet Take 440 mg by mouth daily.     Yes [provider]  OZEMPIC, 0.25 OR 0.5 MG/DOSE, 2 MG/3ML SOPN Inject 0.25 mg into the skin every Saturday.     Yes [provider]  pantoprazole (PROTONIX) 40 MG tablet Take 40 mg by mouth daily.     Yes [provider]  polyvinyl alcohol (LIQUIFILM TEARS) 1.4 % ophthalmic solution Place 1 drop into both eyes as needed for dry eyes.     Yes [provider]  solifenacin (VESICARE) 5 MG tablet Take 5 mg by mouth daily. 11/06/22   Yes [provider]  Specialty Vitamins Products (COLLAGEN ULTRA PO) Take 1 capsule by mouth daily as needed (Unknown).     Yes [provider]  torsemide (DEMADEX) 10 MG tablet Take 1 tablet (10 mg total) by mouth every morning. 11/18/22   Yes Tolia, Sunit, DO            Current Outpatient Medications  Medication Sig Dispense Refill   albuterol (VENTOLIN HFA) 108 (90 Base) MCG/ACT inhaler Inhale 2 puffs into the lungs every 4 (four) hours as needed for wheezing or shortness of breath.       Ascorbic Acid (VITAMIN C) 100 MG tablet Take 100 mg by mouth daily.       B Complex Vitamins (B COMPLEX PO) Take 1 capsule by mouth daily.       Cholecalciferol (VITAMIN D3) 125 MCG (5000 UT) TABS Take 5,000 Units by mouth daily.        lisinopril-hydrochlorothiazide (ZESTORETIC) 20-12.5 MG tablet Take 1 tablet by mouth daily.       Magnesium 250 MG TABS Take 250 mg by mouth daily as needed (Cramps).       Multiple Vitamin (MULTIVITAMINS PO) Take 1 tablet by mouth daily.       Multiple Vitamins-Minerals (ZINC PO) Take 10 mg by mouth daily.       naproxen sodium (ALEVE) 220 MG tablet Take 440 mg by mouth daily.       OZEMPIC, 0.25 OR 0.5 MG/DOSE, 2 MG/3ML SOPN Inject 0.25 mg into the skin every Saturday.       pantoprazole (PROTONIX) 40 MG tablet Take 40 mg by mouth daily.       polyvinyl alcohol (LIQUIFILM TEARS) 1.4 % ophthalmic solution Place 1 drop into both eyes as needed for dry eyes.       solifenacin (VESICARE) 5 MG tablet Take 5 mg by mouth daily.       Specialty Vitamins Products (COLLAGEN ULTRA PO) Take 1 capsule by mouth daily as needed (Unknown).       torsemide (DEMADEX) 10 MG tablet Take 1 tablet (10 mg total) by mouth every morning. 90 tablet 3      No current facility-administered medications for this visit.  Allergies       Allergies  Allergen Reactions   Penicillin G Anaphylaxis      Other Reaction(s): dry mouth   Other Reaction(s): Rash/SOB   Penicillins Rash and Shortness Of Breath      Other Reaction(s): rash/sob   Bee Venom Swelling      Bee stings   Morphine Other (See Comments) and Nausea And Vomiting            Review of Systems:               General:                      normal appetite, + decreased energy, no weight gain, no weight loss, no fever             Cardiac:                       no chest pain with exertion, no chest pain at rest, +SOB with mild exertion, no resting SOB, no PND, no orthopnea, no palpitations, no arrhythmia, no atrial fibrillation, + LE edema, + dizzy spells, no syncope             Respiratory:                 + exertional shortness of breath, no home oxygen, no productive cough, no dry cough, no bronchitis, no wheezing, no hemoptysis, no asthma,  no pain with inspiration or cough, no sleep apnea, no CPAP at night             GI:                               no difficulty swallowing, no reflux, no frequent heartburn, no hiatal hernia, no abdominal pain, no constipation, no diarrhea, no hematochezia, no hematemesis, no melena             GU:                              no dysuria,  no frequency, no urinary tract infection, no hematuria, no kidney stones, no kidney disease             Vascular:                     no pain suggestive of claudication, no pain in feet, no leg cramps, no varicose veins, no DVT, no non-healing foot ulcer             Neuro:                         no stroke, no TIA's, no seizures, no headaches, no temporary blindness one eye,  no slurred speech, no peripheral neuropathy, no chronic pain, no instability of gait, no memory/cognitive dysfunction             Musculoskeletal:         + arthritis, no joint swelling, no myalgias, no difficulty walking, normal mobility              Skin:                            no rash, no itching, no skin infections, no pressure sores or ulcerations  Psych:                         no anxiety, no depression, no nervousness, no unusual recent stress             Eyes:                           no blurry vision, no floaters, no recent vision changes, + wears glasses              ENT:                            no hearing loss, no loose or painful teeth, + dentures,             Hematologic:               no easy bruising, no abnormal bleeding, no clotting disorder, no frequent epistaxis             Endocrine:                   + diabetes, does check CBG's at home                            Physical Exam:               BP 135/76   Pulse 81   Resp 20   Ht 5\' 4"  (1.626 m)   Wt 214 lb (97.1 kg)   SpO2 94% Comment: RA  BMI 36.73 kg/m              General:                      well-appearing             HEENT:                       Unremarkable, NCAT, PERLA, EOMI              Neck:                           no JVD, no bruits, no adenopathy              Chest:                          clear to auscultation, symmetrical breath sounds, no wheezes, no rhonchi              CV:                              RRR, 3/6 systolic murmur RSB, no diastolic murmur             Abdomen:                    soft, non-tender, no masses              Extremities:                 warm, well-perfused, pedal pulses palpable, mild bilateral lower extremity edema  Rectal/GU                   Deferred             Neuro:                         Grossly non-focal and symmetrical throughout             Skin:                            Clean and dry, no rashes, no breakdown   Diagnostic Tests:   TRANSESOPHOGEAL ECHO REPORT       Patient Name:   SARAHY HILLEY Date of Exam: 10/14/2022  Medical Rec #:  [id number]     Height:       64.0 in  Accession #:    [id number]    Weight:       218.0 lb  Date of Birth:  06/26/1945     BSA:          2.029 m  Patient Age:    77 years      BP:           145/91 mmHg  Patient Gender: F             HR:           78 bpm.  Exam Location:  Inpatient   Procedure: Transesophageal Echo, Cardiac Doppler and Color Doppler   Indications:    Nonrheumatic aortic valve stenosis I35    History:        Patient has prior history of Echocardiogram examinations,  most                 recent 09/13/2022. Aortic Valve Disease.    Sonographer:    Dondra Prader RVT RCS  Referring Phys: [id number] [facility] J PATWARDHAN   PROCEDURE: After discussion of the risks and benefits of a TEE, an  informed consent was obtained from the patient. The transesophogeal probe  was passed without difficulty through the esophogus of the patient. Local  oropharyngeal anesthetic was provided  with viscous lidocaine. Sedation performed by different physician. The  patient developed no complications during the procedure.    IMPRESSIONS     1. Left ventricular ejection fraction, by  estimation, is 55 to 60%. The  left ventricle has normal function.   2. Right ventricular systolic function is normal. The right ventricular  size is normal.   3. No left atrial/left atrial appendage thrombus was detected.   4. The mitral valve is normal in structure. Mild to moderate mitral valve  regurgitation. No evidence of mitral stenosis.   5. Tricuspid valve regurgitation is mild to moderate.   6. Aortic stenosis severe by AVA, moderate by gradients, velocity, as  well as dimensionless index 0.28. Marland Kitchen The aortic valve is calcified. Aortic  valve regurgitation is not visualized. Aortic valve area, by VTI measures  0.81 cm. Aortic valve mean  gradient measures 31.0 mmHg. Aortic valve Vmax measures 3.57 m/s.   7. There is mild (Grade II) plaque.   FINDINGS   Left Ventricle: Left ventricular ejection fraction, by estimation, is 55  to 60%. The left ventricle has normal function. The left ventricular  internal cavity size was normal in size.   Right Ventricle: The right ventricular size is normal. No increase in  right ventricular wall thickness. Right ventricular  systolic function is  normal.   Left Atrium: Left atrial size was normal in size. No left atrial/left  atrial appendage thrombus was detected.   Right Atrium: Right atrial size was normal in size.   Pericardium: There is no evidence of pericardial effusion.   Mitral Valve: The mitral valve is normal in structure. Mild to moderate  mitral valve regurgitation. No evidence of mitral valve stenosis.   Tricuspid Valve: The tricuspid valve is normal in structure. Tricuspid  valve regurgitation is mild to moderate. No evidence of tricuspid  stenosis.   Aortic Valve: Aortic stenosis severe by AVA, moderate by gradients,  velocity, as well as dimensionless index 0.28. The aortic valve is  calcified. Aortic valve regurgitation is not visualized. Aortic valve mean  gradient measures 31.0 mmHg. Aortic valve  peak gradient  measures 51.0 mmHg. Aortic valve area, by VTI measures 0.81  cm.   Pulmonic Valve: The pulmonic valve was normal in structure. Pulmonic valve  regurgitation is not visualized. No evidence of pulmonic stenosis.   Aorta: The aortic root is normal in size and structure. There is mild  (Grade II) plaque.   IAS/Shunts: No atrial level shunt detected by color flow Doppler.     LEFT VENTRICLE  PLAX 2D  LVOT diam:     1.90 cm  LV SV:         64  LV SV Index:   32  LVOT Area:     2.84 cm     AORTIC VALVE  AV Area (Vmax):    0.79 cm  AV Area (Vmean):   0.72 cm  AV Area (VTI):     0.81 cm  AV Vmax:           357.00 cm/s  AV Vmean:          255.500 cm/s  AV VTI:            0.791 m  AV Peak Grad:      51.0 mmHg  AV Mean Grad:      31.0 mmHg  LVOT Vmax:         100.00 cm/s  LVOT Vmean:        65.000 cm/s  LVOT VTI:          0.226 m  LVOT/AV VTI ratio: 0.29     SHUNTS  Systemic VTI:  0.23 m  Systemic Diam: 1.90 cm   Truett Mainland MD  Electronically signed by Truett Mainland MD  Signature Date/Time: 10/14/2022/11:24:53 AM        Final        Physicians   Panel Physicians Referring Physician Case Authorizing Physician  Patwardhan, Anabel Bene, MD (Primary)        Procedures   RIGHT/LEFT HEART CATH AND CORONARY ANGIOGRAPHY    Conclusion       LV end diastolic pressure is mildly elevated.   No indication for antiplatelet therapy at this time .   LM: Normal. No significant disease LAD: Normal. No significant disease Lcx: Dominant. No significant disease RCA: Nondominant. No significant disease   RA: 8 mmHg RV: 49/6 mmHg PA:46/25 mmHg, mPAP 31 mmHg PCW: 17 mmHg   CO: 5.3 L.min CI: 2.6 L/min/m2   Simultaneous LV-AO measurement using Langston cathteer Aortic valve mean PG 25 mmHg, AVA 0.94 cm2   Conclusion: No significant coronary artery disease Mildly elevated filling pressures Mild PH, WHO Grp II Aortic stenosis, moderate by gradients, severe by  area. In the correct context, aortic stenosis  could possibly be paradoxically low flow low gradient. Alternatively, dyspnea could be due to mild PH, possible diastolic dysfunction. Recommend clinical correlation.    Elder Negus, MD Pager: [phone] Office: [phone]     Recommendations   Antiplatelet/Anticoag No indication for antiplatelet therapy at this time .    Surgeon Notes       10/14/2022  8:36 AM CV Procedure signed by Elder Negus, MD    Indications   Nonrheumatic aortic valve stenosis [I35.0 (ICD-10-CM)]    Procedural Details   Technical Details Procedures: 1.  Ultrasound-guided right common femoral artery, and right common femoral vein access. 2. Right heart catheterization 3. Left heart catheterization 4. Selective left and right coronary angiography 5. Conscious sedation monitoring 56 min  Indication: Exertional dyspnea Aortic stenosis Abnormal stress test  History: 77 year old Caucasian female with hypertension,  aortic stenosis, ?h/o ventricular tachycardia,h/o breast cancerX2 (s/p bilateral mastectomy, radiation, chemotherapy), now with exertional dyspnea, abnormal stress test   Diagnostic Angiography: Catheter/s advances over guidewire under fluoroscopy Left coronary artery: 5 Fr JL 4  Right coronary artery: 5 Fr JR 4 Left heart catheterization: 6 Fr Langston   Pressures tracings obtained in right atrium, right ventricle, pulmonary artery, and pulmonary capillary wedge position. Invasive hemodynamic measurements performed using Fick method.     Anticoagulation:  None  Hemostasis: Manual pressure  Total contrast used: 45 cc   Total fluoro time: 10.6 min Air Kerma: 227 mGy  Conscious sedation was administered under my direct supervision. IV Versed 1 mg and fentanyl 25 mcg administered. Continuous ECG, pulse oximetry and blood pressure were monitored throughout the entire procedure. Monitoring corroborated by cath lab  nurse and technician.  Total sedation time: 56 minutes.  All wires and catheters removed out of the body at the end of the procedure Final angiogram showed no dissection/perforation.   [image] Elder Negus, MD Pager: [phone] Office: [phone]          Estimated blood loss <50 mL.   During this procedure medications were administered to achieve and maintain moderate conscious sedation while the patient's heart rate, blood pressure, and oxygen saturation were continuously monitored and I was present face-to-face 100% of this time.    Medications (Filter: Administrations occurring from 0922 to 1036 on 10/14/22)  important  Continuous medications are totaled by the amount administered until 10/14/22 1036.    Heparin (Porcine) in NaCl 1000-0.9 UT/500ML-% SOLN (mL)  Total volume: 1,000 mL Date/Time Rate/Dose/Volume Action    10/14/22 0934 500 mL Given    0934 500 mL Given    fentaNYL (SUBLIMAZE) injection (mcg)  Total dose: 25 mcg Date/Time Rate/Dose/Volume Action    10/14/22 0935 25 mcg Given    midazolam (VERSED) injection (mg)  Total dose: 1 mg Date/Time Rate/Dose/Volume Action    10/14/22 0935 1 mg Given    lidocaine (PF) (XYLOCAINE) 1 % injection (mL)  Total volume: 10 mL Date/Time Rate/Dose/Volume Action    10/14/22 0940 10 mL Given    iohexol (OMNIPAQUE) 350 MG/ML injection (mL)  Total volume: 45 mL Date/Time Rate/Dose/Volume Action    10/14/22 1035 45 mL Given      Sedation Time   Sedation Time Physician-1: 56 minutes 31 seconds Contrast        Administrations occurring from 0922 to 1036 on 10/14/22:  Medication Name Total Dose  iohexol (OMNIPAQUE) 350 MG/ML injection 45 mL    Radiation/Fluoro   Fluoro time: 10.6 (min) DAP: 15466 (mGycm2) Cumulative Air Kerma: 227 (mGy) Complications  Complications documented before study signed (10/14/2022 10:56 AM)    No complications were associated with this study.  Documented by Ledell Peoples, RN - 10/14/2022 10:36 AM      Coronary Findings   Diagnostic Dominance: Left Left Main  Vessel is normal in caliber. Vessel is angiographically normal.    Left Anterior Descending  Vessel is normal in caliber. Vessel is angiographically normal.    Left Circumflex  Vessel is normal in caliber. Vessel is angiographically normal.    Right Coronary Artery  Vessel is small. Vessel is angiographically normal.    Intervention    No interventions have been documented.    Right Heart   Right Heart Pressures RA: 8 mmHg RV: 49/6 mmHg PA:46/25 mmHg, mPAP 31 mmHg PCW: 17 mmHg  CO: 5.3 L.min CI: 2.6 L/min/m2    Left Heart   Left Ventricle LV end diastolic pressure is mildly elevated. Aortic valve mean PG 25 mmHg, AVA 0.94 cm2    Coronary Diagrams   Diagnostic Dominance: Left  Intervention    Implants    No implant documentation for this case.    Syngo Images    Show images for CARDIAC CATHETERIZATION Images on Long Term Storage    Show images for Roven[place], Minervini to Procedure Log   Procedure Log    Link to Procedure Log   Procedure Log    Hemo Data   Flowsheet Row Most Recent Value  Fick Cardiac Output 5.29 L/min  Fick Cardiac Output Index 2.61 (L/min)/BSA  Aortic Mean Gradient 25.18 mmHg  Aortic Peak Gradient 29.1 mmHg  Aortic Valve Area 0.94  Aortic Value Area Index 0.46 cm2/BSA  RA A Wave 13 mmHg  RA V Wave 10 mmHg  RA Mean 8 mmHg  RV Systolic Pressure 49 mmHg  RV Diastolic Pressure 6 mmHg  RV EDP 12 mmHg  PA Systolic Pressure 46 mmHg  PA Diastolic Pressure 25 mmHg  PA Mean 31 mmHg  PW A Wave 21 mmHg  PW V Wave 23 mmHg  PW Mean 17 mmHg  AO Systolic Pressure 164 mmHg  AO Diastolic Pressure 74 mmHg  AO Mean 110 mmHg  LV Systolic Pressure 193 mmHg  LV Diastolic Pressure 14 mmHg  LV EDP 26 mmHg  AOp Systolic Pressure 161 mmHg  AOp Diastolic Pressure 73 mmHg  AOp Mean Pressure 110 mmHg  LVp Systolic Pressure 179 mmHg  LVp Diastolic  Pressure 20 mmHg  LVp EDP Pressure 31 mmHg  QP/QS 1  TPVR Index 11.9 HRUI  TSVR Index 43.75 HRUI  PVR SVR Ratio 0.13  TPVR/TSVR Ratio 0.27      Addendum   ADDENDUM REPORT: 11/11/2022 13:36   EXAM: OVER-READ INTERPRETATION  CT CHEST   The following report is an over-read performed by radiologist Dr. Jacob Moores [facility] on 11/11/2022. This over-read does not include interpretation of cardiac or coronary anatomy or pathology. The cardiac TAVR interpretation by the cardiologist is attached.   COMPARISON:  None.   FINDINGS: Extracardiac findings will be described separately under dictation for contemporaneously obtained CTA chest, abdomen and pelvis.   IMPRESSION: Please see separate dictation for contemporaneously obtained CTA chest, abdomen and pelvis dated 11/11/2022 for full description of relevant extracardiac findings.     Electronically Signed   By: Allegra Lai M.D.   On: 11/11/2022 13:36    Addended by Renford Dills, MD on 11/11/2022  1:38 PM    Study Result   Narrative & Impression  CLINICAL DATA:  Severe Aortic Stenosis.   EXAM: Cardiac TAVR CT   TECHNIQUE: A non-contrast, gated CT scan was obtained with axial slices of 3 mm through the heart for aortic valve calcium scoring. A 100 kV retrospective, gated, contrast cardiac scan was obtained. Gantry rotation speed was 250 msecs and collimation was 0.6 mm. Nitroglycerin was not given. The 3D data set was reconstructed in 5% intervals of the 0-95% of the R-R cycle. Systolic and diastolic phases were analyzed on a dedicated workstation using MPR, MIP, and VRT modes. The patient received 100 cc of contrast.   FINDINGS: Image quality: Excellent.   Noise artifact is: Limited.   Valve Morphology: Tricuspid aortic valve with diffuse severe calcifications. Bulky calcification of the RCC/NCC. Restricted leaflet movement in systole.   Aortic Valve Calcium score: 1952    Aortic annular dimension:   Phase assessed: 25%   Annular area: 464 mm2   Annular perimeter: 77.0 mm   Max diameter: 26.6 mm   Min diameter: 22.9 mm   Annular and subannular calcification: No annular calcium. There is moderate subannular calcium under the LCC extending to the AMVL.   Membranous septum length: 5.7 mm   Optimal coplanar projection: LAO 17 CRA 2   Coronary Artery Height above Annulus:   Left Main: 11.7 mm   Right Coronary: 18.7 mm   Sinus of Valsalva Measurements:   Non-coronary: 31 mm   Right-coronary: 29 mm   Left-coronary: 32 mm   Sinus of Valsalva Height:   Non-coronary: 20.3 mm   Right-coronary: 21.7 mm   Left-coronary: 21.6 mm   Sinotubular Junction: 28 mm   Ascending Thoracic Aorta: 33 mm   Coronary Arteries: Normal coronary origin. Left dominance. The study was performed without use of NTG and is insufficient for plaque evaluation. Please refer to recent cardiac catheterization for coronary assessment.   Cardiac Morphology:   Right Atrium: Right atrial size is within normal limits. Contrast reflux into the IVC consistent with elevated RA pressure.   Right Ventricle: The right ventricular cavity is within normal limits.   Left Atrium: Left atrial size is normal in size with no left atrial appendage filling defect.   Left Ventricle: The ventricular cavity size is within normal limits.   Pulmonary arteries: Dilated pulmonary artery suggestive of pulmonary hypertension.   Pulmonary veins: Normal pulmonary venous drainage.   Pericardium: Normal thickness with no significant effusion or calcium present.   Mitral Valve: The mitral valve is degenerative with moderate annular calcium of the AMVL that extends into the LVOT.   Extra-cardiac findings: See attached radiology report for non-cardiac structures.   IMPRESSION: 1. Annular measurements support a 26 mm S3 or 29 mm Evolut Pro.   2. There is moderate subannular calcium  under the LCC extending to the AMVL.   3. Sufficient coronary to annulus distance.   4. Optimal Fluoroscopic Angle for Delivery: LAO 17 CRA 2   5. Dilated pulmonary artery suggestive of pulmonary hypertension.   Gerri Spore T. Flora Lipps, MD   Electronically Signed: By: Lennie Odor M.D. On: 11/11/2022 13:27          Narrative & Impression  CLINICAL DATA:  TAVR preop evaluation   EXAM: CT ANGIOGRAPHY CHEST, ABDOMEN AND PELVIS   TECHNIQUE: Multidetector CT imaging through the chest, abdomen and pelvis was performed using the standard protocol during bolus administration of intravenous contrast. Multiplanar reconstructed images and MIPs were obtained and reviewed to evaluate the vascular anatomy.   RADIATION DOSE REDUCTION: This  exam was performed according to the departmental dose-optimization program which includes automated exposure control, adjustment of the mA and/or kV according to patient size and/or use of iterative reconstruction technique.   CONTRAST:  OMNIPAQUE IOHEXOL 350 MG/ML SOLN   COMPARISON:  None Available.   FINDINGS: CTA CHEST FINDINGS   Cardiovascular: Heart is upper limits of normal in size. No pericardial effusion. Aortic valve thickening and calcifications. Mitral annular calcifications. Normal caliber thoracic aorta with moderate atherosclerotic disease. Dilated main pulmonary artery, measuring up to 3.7 cm. Left main and three-vessel coronary artery calcifications.   Mediastinum/Nodes: Small hiatal hernia. Thyroid is unremarkable. Surgical clips of the right axilla. No enlarged lymph nodes seen in the chest.   Lungs/Pleura: Central airways are patent. No consolidation, pleural effusion or pneumothorax.   Musculoskeletal: Postsurgical changes of the right breast. Left breast implant. No aggressive appearing osseous lesions.   CTA ABDOMEN AND PELVIS FINDINGS   Hepatobiliary: No focal liver abnormality is seen. Cholelithiasis. Evidence  of gallbladder wall thickening. No biliary ductal dilation.   Pancreas: Unremarkable. No pancreatic ductal dilatation or surrounding inflammatory changes.   Spleen: Normal in size without focal abnormality.   Adrenals/Urinary Tract: Bilateral adrenal glands are unremarkable. No hydronephrosis or nephrolithiasis. Bladder is unremarkable.   Stomach/Bowel: Stomach is within normal limits. Appendix appears normal. No evidence of bowel wall thickening, distention, or inflammatory changes.   Vascular/lymphatic: Normal caliber abdominal aorta with moderate atherosclerotic disease. No pathologically enlarged lymph nodes seen in the abdomen or pelvis.   Reproductive: No adnexal mass.   Other: Small bilateral fat containing inguinal hernias. No abdominopelvic ascites.   Musculoskeletal: No acute or significant osseous findings.   VASCULAR MEASUREMENTS PERTINENT TO TAVR:   AORTA:   Minimal Aortic Diameter -  12.0 mm   Severity of Aortic Calcification-moderate   RIGHT PELVIS:   Right Common Iliac Artery -   Minimal Diameter-7.3 mm   Tortuosity-mild   Calcification-mild   Right External Iliac Artery -   Minimal Diameter-5.9 mm   Tortuosity-moderate   Calcification-mild   Right Common Femoral Artery -   Minimal Diameter-6.5 mm   Tortuosity-none   Calcification-none   LEFT PELVIS:   Left Common Iliac Artery -   Minimal Diameter-7.5 mm   Tortuosity-none   Calcification-moderate   Left External Iliac Artery -   Minimal Diameter-5.7 mm   Tortuosity-moderate   Calcification-mild   Left Common Femoral Artery -   Minimal Diameter-7.0 mm   Tortuosity-mild   Calcification-none   Review of the MIP images confirms the above findings.   IMPRESSION: 1. Vascular findings and measurements pertinent to potential TAVR procedure, as detailed above. 2. Thickening and calcification of the aortic valve, compatible with reported clinical history of aortic  stenosis. 3. Moderate aortoiliac atherosclerosis. Left main and 3 vessel coronary artery disease. 4. Dilated main pulmonary artery, findings can be seen in the setting of pulmonary hypertension.     Electronically Signed   By: Allegra Lai M.D.   On: 11/11/2022 12:14        Impression:   This 77 year old woman has stage D3, low-flow/low gradient severe aortic stenosis with NYHA class III symptoms of exertional fatigue and shortness of breath consistent with chronic diastolic congestive heart failure.  She has also been having episodes of dizziness and presyncope as well as fullness in her epigastrium.  I have personally reviewed her TEE, cardiac catheterization, and CTA studies.  Her TEE shows a severely calcified aortic valve with leaflet thickening and restricted mobility.  The mean gradient was measured at 31 mmHg with a valve area 0.81 cm consistent with severe aortic stenosis.  Stroke-volume index was low at 32.  Left ventricular systolic function was normal.  Cardiac catheterization showed no coronary disease with an aortic valve simultaneous mean gradient of 25 mmHg with a valve area of 0.94 cm consistent with severe low-flow/low gradient aortic stenosis. I agree that aortic valve replacement is indicated in this patient for relief of her worsening symptoms and to prevent progressive left ventricular deterioration.  Given her age and comorbidities including prior bilateral breast cancer and reconstructions with radiation therapy to her chest I think that transcatheter aortic valve replacement would be the best treatment option for her.  Her gated cardiac CTA shows anatomy suitable for TAVR using a 26 mm SAPIEN 3 valve.  Her abdominal and pelvic CTA shows adequate pelvic vascular anatomy to allow transfemoral insertion.   The patient and her husband were counseled at length regarding treatment alternatives for management of severe symptomatic aortic stenosis. The risks and benefits of  surgical intervention has been discussed in detail. Long-term prognosis with medical therapy was discussed. Alternative approaches such as conventional surgical aortic valve replacement, transcatheter aortic valve replacement, and palliative medical therapy were compared and contrasted at length. This discussion was placed in the context of the patient's own specific clinical presentation and past medical history. All of their questions have been addressed.    Following the decision to proceed with transcatheter aortic valve replacement, a discussion was held regarding what types of management strategies would be attempted intraoperatively in the event of life-threatening complications, including whether or not the patient would be considered a candidate for the use of cardiopulmonary bypass and/or conversion to open sternotomy for attempted surgical intervention.  I think she would be a candidate for emergent sternotomy to manage any intraoperative complications.  The patient is aware of the fact that transient use of cardiopulmonary bypass may be necessary. The patient has been advised of a variety of complications that might develop including but not limited to risks of death, stroke, paravalvular leak, aortic dissection or other major vascular complications, aortic annulus rupture, device embolization, cardiac rupture or perforation, mitral regurgitation, acute myocardial infarction, arrhythmia, heart block or bradycardia requiring permanent pacemaker placement, congestive heart failure, respiratory failure, renal failure, pneumonia, infection, other late complications related to structural valve deterioration or migration, or other complications that might ultimately cause a temporary or permanent loss of functional independence or other long term morbidity. The patient provides full informed consent for the procedure as described and all questions were answered.       Plan:   Transfemoral TAVR using a  SAPIEN 3 valve.       Alleen Borne, MD

## 2022-12-01 NOTE — Progress Notes (Signed)
Anesthesia Chart Review:  Case: [id number] Date/Time: 12/02/22 1115   Procedures:      Transcatheter Aortic Valve Replacement, Transfemoral     INTRAOPERATIVE TRANSTHORACIC ECHOCARDIOGRAM   Anesthesia type: Monitor Anesthesia Care   Pre-op diagnosis: Severe Aortic Stenosis   [facility]: [facility] ([facility] / [facility]   Providers: [name], MD     CT Surgeon: Evelene Croon, MD   DISCUSSION: Patient is a 77 year old female scheduled for the above procedure.  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 No significant CAD by 10/14/22 cardiac cath. BMI is consistent with obesity.   Last Ozempic 11/22/22. I don't see a recent A1c results. Glucose 175 with labs.   Presurgical COVID-19 test negative. 12/01/22 CXR is still in process.   Per TAVR team communication: "Plan RIJ for pacing."  VS:  BP Readings from Last 3 Encounters:  11/26/22 135/76  11/11/22 (!) 147/68  11/07/22 (!) 146/76   Pulse Readings from Last 3 Encounters:  11/26/22 81  11/11/22 75  11/07/22 (!) 54     PROVIDERS: Joycelyn Rua, MD is PCP  Tessa Lerner, DO is primary cardiologist   LABS: Labs reviewed: Acceptable for surgery. Reviewed by Cline Crock, PA-C with plan to supplement K while in the hospital. (all labs ordered are listed, but only abnormal results are displayed)  Labs Reviewed  SURGICAL PCR SCREEN - Abnormal; Notable for the following components:      Result Value   Staphylococcus aureus POSITIVE (*)    All other components within normal limits  COMPREHENSIVE METABOLIC PANEL - Abnormal; Notable for the following components:   Potassium 3.3 (*)     Chloride 97 (*)    Glucose, Bld 175 (*)    AST 49 (*)    All other components within normal limits  URINALYSIS, ROUTINE W REFLEX MICROSCOPIC - Abnormal; Notable for the following components:   Color, Urine COLORLESS (*)    Specific Gravity, Urine 1.004 (*)    All other components within normal limits  SARS CORONAVIRUS 2 BY RT PCR  CBC  PROTIME-INR  TYPE AND SCREEN     IMAGES: CXR 12/01/22: In process.  CTA Chest/abd/pelvis 11/11/22: IMPRESSION: 1. Vascular findings and measurements pertinent to potential TAVR procedure, as detailed above. 2. Thickening and calcification of the aortic valve, compatible with reported clinical history of aortic stenosis. 3. Moderate aortoiliac atherosclerosis. Left main and 3 vessel coronary artery disease. 4. Dilated main pulmonary artery, findings can be seen in the setting of pulmonary hypertension.   EKG: 12/01/22: Sinus rhythm with Premature atrial complexes Right bundle branch block Left anterior fascicular block ** Bifascicular block ** Left ventricular hypertrophy with repolarization abnormality ( R in aVL ) Abnormal ECG When compared with ECG of 14-Oct-2022 07:49, PREVIOUS ECG IS PRESENT Confirmed by Marca Ancona [phone]) on 12/01/2022 11:12:32 AM   CV: CT Coronary 11/11/2022: IMPRESSION: 1. Annular measurements support a 26 mm S3 or 29 mm Evolut Pro. 2. There is moderate subannular calcium under the LCC extending to the AMVL. 3. Sufficient coronary to annulus distance. 4. Optimal Fluoroscopic Angle for Delivery: LAO 17 CRA 2 5. Dilated pulmonary artery suggestive of pulmonary hypertension.    TEE 10/14/2022: IMPRESSIONS   1. Left ventricular ejection fraction,  by estimation, is 55 to 60%. The  left ventricle has normal function.   2. Right ventricular systolic function is normal. The right ventricular  size is normal.   3. No left atrial/left atrial appendage thrombus was detected.   4. The mitral valve is normal in  structure. Mild to moderate mitral valve  regurgitation. No evidence of mitral stenosis.   5. Tricuspid valve regurgitation is mild to moderate.   6. Aortic stenosis severe by AVA, moderate by gradients, velocity, as  well as dimensionless index 0.28. Marland Kitchen The aortic valve is calcified. Aortic  valve regurgitation is not visualized. Aortic valve area, by VTI measures  0.81 cm. Aortic valve mean  gradient measures 31.0 mmHg. Aortic valve Vmax measures 3.57 m/s.   7. There is mild (Grade II) plaque.    RHC/LHC 10/14/22:   LV end diastolic pressure is mildly elevated.   No indication for antiplatelet therapy at this time .   LM: Normal. No significant disease LAD: Normal. No significant disease Lcx: Dominant. No significant disease RCA: Nondominant. No significant disease   RA: 8 mmHg RV: 49/6 mmHg PA:46/25 mmHg, mPAP 31 mmHg PCW: 17 mmHg   CO: 5.3 L.min CI: 2.6 L/min/m2   Simultaneous LV-AO measurement using Langston cathteer Aortic valve mean PG 25 mmHg, AVA 0.94 cm2   Conclusion: No significant coronary artery disease Mildly elevated filling pressures Mild PH, WHO Grp II Aortic stenosis, moderate by gradients, severe by area. In the correct context, aortic stenosis could possibly be paradoxically low flow low gradient. Alternatively, dyspnea could be due to mild PH, possible diastolic dysfunction. Recommend clinical correlation.    Carotid artery duplex 09/22/2022: Duplex suggests stenosis in the right internal carotid artery (minimal).  Duplex suggests stenosis in the left internal carotid artery (1-15%).  Both vertebral arteries are patent with antegrade flow.  No prior studies for comparison.  Follow up in one year is appropriate if clinically indicated.    Regadenoson Nuclear stress test 09/12/2022: There is a moderate sized moderate reversible defect in the lateral, inferior and infero-apical regions.  Overall LV systolic function is abnormal with regional wall  motion abnormalities in the same distribution (RCA) Stress LV EF: 46%.  Nondiagnostic ECG stress. The heart rate response was consistent with Regadenoson.  No previous exam available for comparison. Intermediate risk study.   Past Medical History:  Diagnosis Date   Allergic rhinitis    Breast cancer, left (HCC) 2007   ductal carcinoma in situ, underwent left mastectomy 07/2005; reconstructive surgery with expander, permanent implant placed 03/2006, hospitalized January 2008 with breast cellulitis.   Breast cancer, right (HCC) 1998   1998; modified radical right mastectomy 1998 for infiltrating ductal adenocarcinoma with 7 of 12 axillary nodes positive. Underwent adjuvant chemotherapy and bone marrow transplant by Dr Greggory Stallion as well as radiation therapy. Also had Tram flap reconstruction.   Diabetes mellitus without complication (HCC)    History of ventricular tachycardia    Hypertension    Vitamin D deficiency     Past Surgical History:  Procedure Laterality Date   biopsy on scalp  2010   breast implant Left 2007   CESAREAN SECTION     childbirth     63, 66, 67, 70   EYE SURGERY  1952   hysterectomy with one ovary/due to fibroids  1987   left mastectomy Left 2007   NASAL SEPTUM SURGERY  1975   PORTACATH PLACEMENT  1998   right mastectomy Right 1998   RIGHT/LEFT HEART CATH  AND CORONARY ANGIOGRAPHY N/A 10/14/2022   Procedure: RIGHT/LEFT HEART CATH AND CORONARY ANGIOGRAPHY;  Surgeon: Elder Negus, MD;  [facility];  Service: Cardiovascular;  Laterality: N/A;   stem cell infusion  1998   surgery for crossed eyes     TEE WITHOUT CARDIOVERSION N/A 10/14/2022   Procedure: TRANSESOPHAGEAL ECHOCARDIOGRAM;  Surgeon: Elder Negus, MD;  Location: [facility];  Service: Cardiovascular;  Laterality: N/A;   TONSILLECTOMY  1961   tran flap R breast Right 1998   tri-port  1998   TUBAL LIGATION      MEDICATIONS:  albuterol (VENTOLIN HFA) 108 (90 Base) MCG/ACT  inhaler   Ascorbic Acid (VITAMIN C) 100 MG tablet   B Complex Vitamins (B COMPLEX PO)   Cholecalciferol (VITAMIN D3) 125 MCG (5000 UT) TABS   lisinopril-hydrochlorothiazide (ZESTORETIC) 20-12.5 MG tablet   Magnesium 250 MG TABS   Multiple Vitamin (MULTIVITAMINS PO)   Multiple Vitamins-Minerals (ZINC PO)   naproxen sodium (ALEVE) 220 MG tablet   OZEMPIC, 0.25 OR 0.5 MG/DOSE, 2 MG/3ML SOPN   pantoprazole (PROTONIX) 40 MG tablet   polyvinyl alcohol (LIQUIFILM TEARS) 1.4 % ophthalmic solution   solifenacin (VESICARE) 5 MG tablet   Specialty Vitamins Products (COLLAGEN ULTRA PO)   torsemide (DEMADEX) 10 MG tablet   No current facility-administered medications for this encounter.    [START ON 12/02/2022] ceFAZolin (ANCEF) IVPB 2g/100 mL premix   [START ON 12/02/2022] dexmedetomidine (PRECEDEX) 400 MCG/100ML (4 mcg/mL) infusion   [START ON 12/02/2022] heparin 30,000 units/NS 1000 mL solution for CELLSAVER   [START ON 12/02/2022] magnesium sulfate (IV Push/IM) injection 40 mEq   [START ON 12/02/2022] norepinephrine (LEVOPHED) 4mg  in (0.016 mg/mL) premix infusion   [START ON 12/02/2022] potassium chloride injection 80 mEq     Shonna Chock, PA-C Surgical Short Stay/Anesthesiology [facility] Phone [phone] [facility] Phone [phone] 12/01/2022 4:30 PM

## 2022-12-01 NOTE — Progress Notes (Addendum)
Patient signed all consents at PAT lab appointment. CHG soap and instructions were given to patient. CHG surgical prep reviewed with patient and all questions answered.  Patients chart send to anesthesia for review.  Pt verified last dose of Ozempic was Saturday, August 10th

## 2022-12-02 ENCOUNTER — Inpatient Hospital Stay (HOSPITAL_COMMUNITY): Payer: Medicare HMO | Admitting: Certified Registered Nurse Anesthetist

## 2022-12-02 ENCOUNTER — Inpatient Hospital Stay (HOSPITAL_COMMUNITY): Payer: Medicare HMO

## 2022-12-02 ENCOUNTER — Inpatient Hospital Stay (HOSPITAL_COMMUNITY): Payer: Medicare HMO | Admitting: Vascular Surgery

## 2022-12-02 ENCOUNTER — Encounter (HOSPITAL_COMMUNITY)
Admission: RE | Disposition: A | Discharge status: Home / Self Care | Payer: Self-pay | Source: Home / Self Care | Attending: Surgery

## 2022-12-02 ENCOUNTER — Other Ambulatory Visit: Payer: Self-pay

## 2022-12-02 ENCOUNTER — Inpatient Hospital Stay (HOSPITAL_COMMUNITY)
Admission: RE | Admit: 2022-12-02 | Discharge: 2022-12-03 | DRG: 266 | Disposition: A | Discharge status: Home / Self Care | Payer: Medicare HMO | Attending: Surgery | Admitting: Surgery

## 2022-12-02 ENCOUNTER — Other Ambulatory Visit: Payer: Self-pay | Admitting: Physician Assistant

## 2022-12-02 ENCOUNTER — Encounter (HOSPITAL_COMMUNITY): Payer: Self-pay | Admitting: Internal Medicine

## 2022-12-02 DIAGNOSIS — Z9013 Acquired absence of bilateral breasts and nipples: Secondary | ICD-10-CM

## 2022-12-02 DIAGNOSIS — I350 Nonrheumatic aortic (valve) stenosis: Secondary | ICD-10-CM

## 2022-12-02 DIAGNOSIS — I110 Hypertensive heart disease with heart failure: Secondary | ICD-10-CM | POA: Diagnosis present

## 2022-12-02 DIAGNOSIS — E876 Hypokalemia: Secondary | ICD-10-CM | POA: Diagnosis present

## 2022-12-02 DIAGNOSIS — K219 Gastro-esophageal reflux disease without esophagitis: Secondary | ICD-10-CM

## 2022-12-02 DIAGNOSIS — Z821 Family history of blindness and visual loss: Secondary | ICD-10-CM | POA: Diagnosis not present

## 2022-12-02 DIAGNOSIS — E119 Type 2 diabetes mellitus without complications: Secondary | ICD-10-CM | POA: Diagnosis present

## 2022-12-02 DIAGNOSIS — Z87891 Personal history of nicotine dependence: Secondary | ICD-10-CM | POA: Diagnosis not present

## 2022-12-02 DIAGNOSIS — Z1152 Encounter for screening for COVID-19: Secondary | ICD-10-CM

## 2022-12-02 DIAGNOSIS — I2722 Pulmonary hypertension due to left heart disease: Secondary | ICD-10-CM | POA: Diagnosis present

## 2022-12-02 DIAGNOSIS — Z803 Family history of malignant neoplasm of breast: Secondary | ICD-10-CM | POA: Diagnosis not present

## 2022-12-02 DIAGNOSIS — Z9221 Personal history of antineoplastic chemotherapy: Secondary | ICD-10-CM

## 2022-12-02 DIAGNOSIS — Z952 Presence of prosthetic heart valve: Secondary | ICD-10-CM | POA: Diagnosis not present

## 2022-12-02 DIAGNOSIS — I358 Other nonrheumatic aortic valve disorders: Secondary | ICD-10-CM | POA: Diagnosis present

## 2022-12-02 DIAGNOSIS — Z8249 Family history of ischemic heart disease and other diseases of the circulatory system: Secondary | ICD-10-CM | POA: Diagnosis not present

## 2022-12-02 DIAGNOSIS — Z833 Family history of diabetes mellitus: Secondary | ICD-10-CM

## 2022-12-02 DIAGNOSIS — Z006 Encounter for examination for normal comparison and control in clinical research program: Secondary | ICD-10-CM | POA: Diagnosis not present

## 2022-12-02 DIAGNOSIS — Z853 Personal history of malignant neoplasm of breast: Secondary | ICD-10-CM

## 2022-12-02 DIAGNOSIS — R509 Fever, unspecified: Secondary | ICD-10-CM | POA: Diagnosis not present

## 2022-12-02 DIAGNOSIS — Z6835 Body mass index (BMI) 35.0-35.9, adult: Secondary | ICD-10-CM | POA: Diagnosis not present

## 2022-12-02 DIAGNOSIS — Z90710 Acquired absence of both cervix and uterus: Secondary | ICD-10-CM

## 2022-12-02 DIAGNOSIS — Z954 Presence of other heart-valve replacement: Secondary | ICD-10-CM | POA: Diagnosis not present

## 2022-12-02 DIAGNOSIS — J45909 Unspecified asthma, uncomplicated: Secondary | ICD-10-CM

## 2022-12-02 DIAGNOSIS — Z79899 Other long term (current) drug therapy: Secondary | ICD-10-CM

## 2022-12-02 DIAGNOSIS — I10 Essential (primary) hypertension: Secondary | ICD-10-CM

## 2022-12-02 DIAGNOSIS — I4510 Unspecified right bundle-branch block: Secondary | ICD-10-CM

## 2022-12-02 DIAGNOSIS — Z6836 Body mass index (BMI) 36.0-36.9, adult: Secondary | ICD-10-CM | POA: Diagnosis not present

## 2022-12-02 DIAGNOSIS — Z8042 Family history of malignant neoplasm of prostate: Secondary | ICD-10-CM | POA: Diagnosis not present

## 2022-12-02 DIAGNOSIS — Z8679 Personal history of other diseases of the circulatory system: Secondary | ICD-10-CM

## 2022-12-02 DIAGNOSIS — Z923 Personal history of irradiation: Secondary | ICD-10-CM

## 2022-12-02 DIAGNOSIS — E559 Vitamin D deficiency, unspecified: Secondary | ICD-10-CM | POA: Diagnosis present

## 2022-12-02 DIAGNOSIS — Z9882 Breast implant status: Secondary | ICD-10-CM

## 2022-12-02 DIAGNOSIS — C50919 Malignant neoplasm of unspecified site of unspecified female breast: Secondary | ICD-10-CM | POA: Diagnosis present

## 2022-12-02 DIAGNOSIS — I371 Nonrheumatic pulmonary valve insufficiency: Secondary | ICD-10-CM | POA: Diagnosis not present

## 2022-12-02 DIAGNOSIS — I5033 Acute on chronic diastolic (congestive) heart failure: Secondary | ICD-10-CM | POA: Insufficient documentation

## 2022-12-02 DIAGNOSIS — I452 Bifascicular block: Secondary | ICD-10-CM | POA: Diagnosis present

## 2022-12-02 HISTORY — PX: INTRAOPERATIVE TRANSTHORACIC ECHOCARDIOGRAM: SHX6523

## 2022-12-02 HISTORY — DX: Unspecified asthma, uncomplicated: J45.909

## 2022-12-02 HISTORY — DX: Gastro-esophageal reflux disease without esophagitis: K21.9

## 2022-12-02 HISTORY — PX: TRANSCATHETER AORTIC VALVE REPLACEMENT, TRANSFEMORAL: SHX6400

## 2022-12-02 HISTORY — DX: Diaphragmatic hernia without obstruction or gangrene: K44.9

## 2022-12-02 LAB — BASIC METABOLIC PANEL
Anion gap: 14 (ref 5–15)
BUN: 13 mg/dL (ref 8–23)
CO2: 27 mmol/L (ref 22–32)
Calcium: 8.8 mg/dL — ABNORMAL LOW (ref 8.9–10.3)
Chloride: 98 mmol/L (ref 98–111)
Creatinine, Ser: 0.74 mg/dL (ref 0.44–1.00)
GFR, Estimated: >60 mL/min (ref >60)
Glucose, Bld: 134 mg/dL — ABNORMAL HIGH (ref 70–99)
Potassium: 3.5 mmol/L (ref 3.5–5.1)
Sodium: 139 mmol/L (ref 135–145)

## 2022-12-02 LAB — ECHOCARDIOGRAM LIMITED
AR max vel: 0.61 cm2
AV Area VTI: 0.52 cm2
AV Area mean vel: 0.49 cm2
AV Mean grad: 11 mmHg
AV Peak grad: 15.1 mmHg
Ao pk vel: 1.95 m/s

## 2022-12-02 LAB — POCT I-STAT, CHEM 8
BUN: 16 mg/dL (ref 8–23)
Calcium, Ion: 1.18 mmol/L (ref 1.15–1.40)
Chloride: 100 mmol/L (ref 98–111)
Creatinine, Ser: 0.6 mg/dL (ref 0.44–1.00)
Glucose, Bld: 182 mg/dL — ABNORMAL HIGH (ref 70–99)
HCT: 36 % (ref 36.0–46.0)
Hemoglobin: 12.2 g/dL (ref 12.0–15.0)
Potassium: 3.3 mmol/L — ABNORMAL LOW (ref 3.5–5.1)
Sodium: 138 mmol/L (ref 135–145)
TCO2: 31 mmol/L (ref 22–32)

## 2022-12-02 LAB — GLUCOSE, CAPILLARY
Glucose-Capillary: 155 mg/dL — ABNORMAL HIGH (ref 70–99)
Glucose-Capillary: 127 mg/dL — ABNORMAL HIGH (ref 70–99)

## 2022-12-02 LAB — ABO/RH: ABO/RH(D): A NEG

## 2022-12-02 SURGERY — TRANSCATHETER AORTIC VALVE REPLACEMENT, TRANSFEMORAL
Anesthesia: Monitor Anesthesia Care

## 2022-12-02 MED ORDER — HEPARIN (PORCINE) IN NACL 1000-0.9 UT/500ML-% IV SOLN
INTRAVENOUS | Status: DC | PRN
  Administered 2022-12-02 (×3): 500 mL

## 2022-12-02 MED ORDER — ACETAMINOPHEN 650 MG RE SUPP: 650.0000 mg | Freq: Four times a day (QID) | RECTAL | Status: DC | PRN

## 2022-12-02 MED ORDER — SODIUM CHLORIDE 0.9 % IV SOLN: INTRAVENOUS | Status: DC

## 2022-12-02 MED ORDER — OXYCODONE HCL 5 MG PO TABS: 5.0000 mg | ORAL_TABLET | ORAL | Status: DC | PRN

## 2022-12-02 MED ORDER — POTASSIUM CHLORIDE CRYS ER 20 MEQ PO TBCR
EXTENDED_RELEASE_TABLET | ORAL | Status: AC
  Administered 2022-12-02: 20 meq
  Filled 2022-12-02: qty 2

## 2022-12-02 MED ORDER — ACETAMINOPHEN 325 MG PO TABS: 650.0000 mg | ORAL_TABLET | Freq: Four times a day (QID) | ORAL | Status: DC | PRN

## 2022-12-02 MED ORDER — CHLORHEXIDINE GLUCONATE 4 % EX SOLN
60.0000 mL | Freq: Once | CUTANEOUS | Status: DC
  Filled 2022-12-02: qty 60

## 2022-12-02 MED ORDER — POTASSIUM CHLORIDE CRYS ER 20 MEQ PO TBCR: 40.0000 meq | EXTENDED_RELEASE_TABLET | Freq: Once | ORAL | Status: AC

## 2022-12-02 MED ORDER — FESOTERODINE FUMARATE ER 4 MG PO TB24: 4.0000 mg | ORAL_TABLET | Freq: Every day | ORAL | Status: DC

## 2022-12-02 MED ORDER — FENTANYL CITRATE (PF) 100 MCG/2ML IJ SOLN
INTRAMUSCULAR | Status: DC | PRN
  Administered 2022-12-02 (×4): 25 ug via INTRAVENOUS

## 2022-12-02 MED ORDER — CHLORHEXIDINE GLUCONATE 0.12 % MT SOLN
15.0000 mL | Freq: Once | OROMUCOSAL | Status: AC
  Administered 2022-12-02: 15 mL via OROMUCOSAL
  Filled 2022-12-02 (×2): qty 15

## 2022-12-02 MED ORDER — POTASSIUM CHLORIDE CRYS ER 20 MEQ PO TBCR
40.0000 meq | EXTENDED_RELEASE_TABLET | Freq: Once | ORAL | Status: AC
  Administered 2022-12-02: 40 meq via ORAL

## 2022-12-02 MED ORDER — FUROSEMIDE 10 MG/ML IJ SOLN
40.0000 mg | Freq: Two times a day (BID) | INTRAMUSCULAR | Status: DC
  Administered 2022-12-02 – 2022-12-03 (×2): 40 mg via INTRAVENOUS
  Filled 2022-12-02 (×2): qty 4

## 2022-12-02 MED ORDER — VANCOMYCIN HCL IN DEXTROSE 1-5 GM/200ML-% IV SOLN
1000.0000 mg | Freq: Once | INTRAVENOUS | Status: AC
  Administered 2022-12-02: 1000 mg via INTRAVENOUS
  Filled 2022-12-02: qty 200

## 2022-12-02 MED ORDER — ONDANSETRON HCL 4 MG/2ML IJ SOLN: 4.0000 mg | Freq: Four times a day (QID) | INTRAMUSCULAR | Status: DC | PRN

## 2022-12-02 MED ORDER — IOHEXOL 350 MG/ML SOLN
INTRAVENOUS | Status: DC | PRN
  Administered 2022-12-02: 90 mL

## 2022-12-02 MED ORDER — CHLORHEXIDINE GLUCONATE 4 % EX SOLN
30.0000 mL | CUTANEOUS | Status: DC
  Filled 2022-12-02: qty 30

## 2022-12-02 MED ORDER — SODIUM CHLORIDE 0.9 % IV SOLN: 250.0000 mL | INTRAVENOUS | Status: DC | PRN

## 2022-12-02 MED ORDER — PANTOPRAZOLE SODIUM 40 MG PO TBEC
40.0000 mg | DELAYED_RELEASE_TABLET | Freq: Every day | ORAL | Status: DC
  Administered 2022-12-02 – 2022-12-03 (×2): 40 mg via ORAL
  Filled 2022-12-02 (×2): qty 1

## 2022-12-02 MED ORDER — SODIUM CHLORIDE 0.9 % IV SOLN: INTRAVENOUS | Status: AC

## 2022-12-02 MED ORDER — LIDOCAINE HCL (PF) 1 % IJ SOLN
INTRAMUSCULAR | Status: DC | PRN
  Administered 2022-12-02 (×2): 10 mL
  Administered 2022-12-02: 3 mL

## 2022-12-02 MED ORDER — FENTANYL CITRATE (PF) 100 MCG/2ML IJ SOLN
INTRAMUSCULAR | Status: AC
  Filled 2022-12-02: qty 2

## 2022-12-02 MED ORDER — HEPARIN SODIUM (PORCINE) 1000 UNIT/ML IJ SOLN
INTRAMUSCULAR | Status: DC | PRN
  Administered 2022-12-02: 14000 [IU] via INTRAVENOUS

## 2022-12-02 MED ORDER — ASPIRIN 81 MG PO CHEW
81.0000 mg | CHEWABLE_TABLET | Freq: Every day | ORAL | Status: DC
  Administered 2022-12-03: 81 mg via ORAL
  Filled 2022-12-02: qty 1

## 2022-12-02 MED ORDER — INSULIN ASPART 100 UNIT/ML IJ SOLN: 0.0000 [IU] | INTRAMUSCULAR | Status: DC | PRN

## 2022-12-02 MED ORDER — LACTATED RINGERS IV SOLN: INTRAVENOUS | Status: DC

## 2022-12-02 MED ORDER — SODIUM CHLORIDE 0.9% FLUSH: 3.0000 mL | INTRAVENOUS | Status: DC | PRN

## 2022-12-02 MED ORDER — PROTAMINE SULFATE 10 MG/ML IV SOLN
INTRAVENOUS | Status: DC | PRN
  Administered 2022-12-02 (×7): 20 mg via INTRAVENOUS

## 2022-12-02 MED ORDER — SODIUM CHLORIDE 0.9% FLUSH
3.0000 mL | Freq: Two times a day (BID) | INTRAVENOUS | Status: DC
  Administered 2022-12-02 – 2022-12-03 (×2): 3 mL via INTRAVENOUS

## 2022-12-02 MED ORDER — POTASSIUM CHLORIDE CRYS ER 20 MEQ PO TBCR
20.0000 meq | EXTENDED_RELEASE_TABLET | Freq: Two times a day (BID) | ORAL | Status: DC
  Administered 2022-12-02 – 2022-12-03 (×2): 20 meq via ORAL
  Filled 2022-12-02 (×3): qty 1

## 2022-12-02 MED ORDER — FUROSEMIDE 10 MG/ML IJ SOLN
INTRAMUSCULAR | Status: AC
  Filled 2022-12-02: qty 4

## 2022-12-02 MED ORDER — CHLORHEXIDINE GLUCONATE 4 % EX SOLN: 60.0000 mL | Freq: Once | CUTANEOUS | Status: DC

## 2022-12-02 MED ORDER — FUROSEMIDE 10 MG/ML IJ SOLN
INTRAMUSCULAR | Status: DC | PRN
  Administered 2022-12-02: 40 mg via INTRAMUSCULAR

## 2022-12-02 MED ORDER — NITROGLYCERIN IN D5W 200-5 MCG/ML-% IV SOLN: 0.0000 ug/min | INTRAVENOUS | Status: DC

## 2022-12-02 SURGICAL SUPPLY — 29 items
BAG SNAP BAND KOVER 36X36 (MISCELLANEOUS) ×2
CABLE ADAPT PACING TEMP 12FT (ADAPTER) ×1
CATH 26 ULTRA DELIVERY (CATHETERS) ×1
CATH DIAG 6FR PIGTAIL ANGLED (CATHETERS) ×1
CATH INFINITI 5FR ANG PIGTAIL (CATHETERS) ×1
CATH INFINITI 6F AL1 (CATHETERS) ×1
CLOSURE MYNX CONTROL 6F/7F (Vascular Products) ×1 IMPLANT
CLOSURE PERCLOSE PROSTYLE (VASCULAR PRODUCTS) ×3
CRIMPER (MISCELLANEOUS) ×1
DEVICE INFLATION ATRION QL2530 (MISCELLANEOUS) ×1
KIT HEART LEFT (KITS) ×1
PACK CARDIAC CATHETERIZATION (CUSTOM PROCEDURE TRAY) ×1
SET ATX-X65L (MISCELLANEOUS) ×1
SHEATH INTRODUCER SET 20-26 (SHEATH) ×1
SHEATH PINNACLE 6F 10CM (SHEATH) ×3
SHEATH PINNACLE 8F 10CM (SHEATH) ×1
SHEATH PROBE COVER 6X72 (BAG) ×1
STOPCOCK MORSE 400PSI 3WAY (MISCELLANEOUS) ×2
TRANSDUCER W/STOPCOCK (MISCELLANEOUS) ×2
TUBING CIL FLEX 10 FLL-RA (TUBING) ×1
VALVE SAPIEN3 ULTRA 26 RSEARCH (Valve) ×1 IMPLANT
WIRE AMPLATZ SS-J .035X180CM (WIRE) ×1
WIRE EMERALD 3MM-J .035X150CM (WIRE) ×1
WIRE EMERALD 3MM-J .035X260CM (WIRE) ×1
WIRE EMERALD ST .035X260CM (WIRE) ×2
WIRE MICRO SET SILHO 5FR 7 (SHEATH) ×2
WIRE MICROINTRODUCER 60CM (WIRE) ×1
WIRE PACING TEMP ST TIP 5 (CATHETERS) ×1
WIRE SAFARI SM CURVE 275 (WIRE) ×1

## 2022-12-02 NOTE — Anesthesia Procedure Notes (Signed)
Procedure Name: MAC Date/Time: 12/02/2022 11:53 AM  Performed by: Cy Blamer, CRNAPre-anesthesia Checklist: Patient identified, Emergency Drugs available, Suction available, Patient being monitored and Timeout performed Patient Re-evaluated:Patient Re-evaluated prior to induction Oxygen Delivery Method: Simple face mask Placement Confirmation: positive ETCO2 Dental Injury: Teeth and Oropharynx as per pre-operative assessment

## 2022-12-02 NOTE — Discharge Instructions (Signed)

## 2022-12-02 NOTE — Progress Notes (Addendum)
  [facility]   MULTIDISCIPLINARY HEART VALVE TEAM  Patient doing well s/p TAVR. She is hemodynamically stable. Groin sites stable. ECG with no high grade block. Since IV Lasix, the patient has put out approximately 1L while still in HA. K+ was replaced however will plan repeat BMET today at 2000 and again for AM labs. If replacement needed, can call on-call cardiology fellow. Plan early ambulation after bedrest completed and hopeful discharge over the next 24-48 hours.   Georgie Chard NP-C Structural Heart Team  Pager: [phone] Phone: [phone]

## 2022-12-02 NOTE — Progress Notes (Signed)
Carlean Jews, PA notified of patient's potassium of 3.3.  No orders received.

## 2022-12-02 NOTE — Transfer of Care (Signed)
Immediate Anesthesia Transfer of Care Note  Patient: [name]  Procedure(s) Performed: Transcatheter Aortic Valve Replacement, Transfemoral INTRAOPERATIVE TRANSTHORACIC ECHOCARDIOGRAM  Patient Location: Cath Lab  Anesthesia Type:MAC  Level of Consciousness: awake, alert , and oriented  Airway & Oxygen Therapy: Patient Spontanous Breathing  Post-op Assessment: Report given to RN, Post -op Vital signs reviewed and stable, Patient moving all extremities X 4, and Patient able to stick tongue midline  Post vital signs: Reviewed and stable  Last Vitals:  Vitals Value Taken Time  BP 139/61 12/02/22 1340  Temp 98.6   Pulse 63 12/02/22 1342  Resp 17 12/02/22 1342  SpO2 91 % 12/02/22 1342  Vitals shown include unfiled device data.  Last Pain:  Vitals:   12/02/22 0935  TempSrc:   PainSc: 0-No pain      Patients Stated Pain Goal: 0 (12/02/22 0935)  Complications: There were no known notable events for this encounter.

## 2022-12-02 NOTE — Progress Notes (Signed)
Patient bed rest completed, B groins level 0. Ambulated to bathroom and back to chair. Call bell with in reach. [name], Randall An rN

## 2022-12-02 NOTE — Discharge Summary (Signed)
[facility]   MULTIDISCIPLINARY HEART VALVE TEAM  Discharge Summary    Patient ID: [name] MRN: [id number]; DOB: Nov 23, 1945  Admit date: 12/02/2022 Discharge date: 12/03/2022  Primary Care Provider: Joycelyn Rua, MD  Primary Cardiologist: Tessa Lerner, DO / Dr. Lynnette Caffey & Dr. Laneta Simmers (TAVR)  Discharge Diagnoses    Principal Problem:   S/P TAVR (transcatheter aortic valve replacement) Active Problems:   H/O bilateral mastectomy   Breast cancer (HCC)   Nonrheumatic aortic valve stenosis   Hypertension   History of ventricular tachycardia   GERD (gastroesophageal reflux disease)   Diabetes mellitus without complication (HCC)   Acute on chronic heart failure with preserved ejection fraction (HFpEF) (HCC)   Allergies Allergies  Allergen Reactions   Penicillin G Anaphylaxis    Other Reaction(s): dry mouth  Other Reaction(s): Rash/SOB   Penicillins Rash and Shortness Of Breath    Other Reaction(s): rash/sob   Bee Venom Swelling    Bee stings   Morphine Other (See Comments) and Nausea And Vomiting    Diagnostic Studies/Procedures    [facility]  TAVR OPERATIVE NOTE     Date of Procedure:                12/02/2022   Preoperative Diagnosis:      Severe Aortic Stenosis    Postoperative Diagnosis:    Same    Procedure:        Transcatheter Aortic Valve Replacement - Transfemoral Approach             Edwards Sapien 3 Resilia THV (size 26 mm, model # 9755RLS)              Co-Surgeons:                         Alleen Borne, MD and Alverda Skeans, MD Anesthesiologist:                  Karna Christmas, MD   Echocardiographer:              Charlton Haws, MD   Pre-operative Echo Findings: Severe aortic stenosis Normal left ventricular systolic function   Post-operative Echo Findings: No paravalvular leak Normal left ventricular systolic function   Left Heart Catheterization Findings: Left ventricular end-diastolic pressure of    _____________    Echo 12/03/22: completed but pending formal read at the time of discharge   History of Present Illness     [name] is a 77 y.o. female with a history of breast cancer, morbid obesity, former tobacco abuse, ventricular tachycardia, DMT2, HTN, RBBB/LAFB and severe LFLG aortic stenosis who presented to [facility] on 12/02/22 for planned TAVR.   She has a history of right breast cancer in 1998 treated with right radical mastectomy with adjuvant chemotherapy and radiation therapy to the chest followed by bone marrow transplant and TRAM flap reconstruction as well as a left breast ductal carcinoma in situ status post left mastectomy in 2007 with reconstructive surgery using an implant.  She had a 2D echo in 08/2022, but it was not possible to determine the severity of her aortic stenosis due to poor windows related to her mastectomies and reconstructions.  She underwent a TEE on 10/14/2022 which showed EF 55%, and severe AS with mean grad 31 mmHg, AVA 0.81 cm2, SVI 32 and mild-mod MR/TR. [facility] 10/14/22 showed no significant coronary artery disease. The aortic valve simultaneous mean gradient was  25 mmHg with a valve area of 0.94 cm. There is mild pulmonary hypertension at 46/25 with a mean of 31. Pulmonary capillary wedge pressure was 17. She has reported progressive exertional shortness of breath fatigue and generalized weakness since March 2024   The patient was evaluated by the multidisciplinary valve team and felt to have severe, symptomatic aortic stenosis and to be a suitable candidate for TAVR, which was set up for 12/02/22.   Hospital Course     Consultants: none   Severe AS: s/p successful TAVR with a 26 mm Edwards Sapien 3 Ultra Resilia THV via the TF approach on 12/02/22. Post operative echo completed but pending formal read. Groin sites are stable. Continued on a baby Asprin 81mg  daily. Walked with cardiac rehab with no issues. Plan for discharge home today with close  follow up in the outpatient setting.   Acute on chronic diastolic CHF: as evidenced by an elevated LVEDP 35 at the time of TAVR. She was started on IV lasix 40mg  BID while admitted. Will resume home Lisinopril-HCT and torsemide. Add Kdur given persistent hypokalemia. BMET at follow up.   Fevers: Tmax of 100.15F. Felt to possibly be related to IVs.   RBBB/LAFB: this has remained stable with no progression, but given underlying conduction disease, plan to discharge with a Zio AT to rule out delayed HAVB.   HTN: BP mildly elevated in the setting of holding home meds. Resume and follow.   DMT2: treated with SSI while admitted. Resume home meds at discharge.   Morbid obesity: Body mass index is 35.65 kg/m. Resume Ozempic.  Elevated Raise Score: with underlying RBBB and severe AS, she is at increased risk for cardiac amyloid. Will discuss in the outpatient setting.  _____________  Discharge Vitals Blood pressure (!) 156/89, pulse (!) 112, temperature 98.3 F (36.8 C), temperature source Oral, resp. rate 19, height 5\' 4"  (1.626 m), weight 94.2 kg, SpO2 96%.  Filed Weights   12/02/22 0913 12/03/22 0447  Weight: 97.1 kg 94.2 kg     Labs & Radiologic Studies    CBC Recent Labs    12/01/22 0900 12/02/22 1319 12/03/22 0642  WBC 6.0  --  8.8  HGB 13.9 12.2 13.9  HCT 42.1 36.0 40.8  MCV 83.7  --  83.8  PLT 175  --  153   Basic Metabolic Panel Recent Labs    04/15/70 1845 12/03/22 0642  NA 139 138  K 3.5 3.9  CL 98 100  CO2 27 25  GLUCOSE 134* 173*  BUN 13 11  CREATININE 0.74 0.79  CALCIUM 8.8* 8.7*  MG  --  1.5*   Liver Function Tests Recent Labs    12/01/22 0900  AST 49*  ALT 31  ALKPHOS 56  BILITOT 0.6  PROT 7.1  ALBUMIN 3.9   No results for input(s): "LIPASE", "AMYLASE" in the last 72 hours. Cardiac Enzymes No results for input(s): "CKTOTAL", "CKMB", "CKMBINDEX", "TROPONINI" in the last 72 hours. BNP Invalid input(s): "POCBNP" D-Dimer No results for  input(s): "DDIMER" in the last 72 hours. Hemoglobin A1C No results for input(s): "HGBA1C" in the last 72 hours. Fasting Lipid Panel No results for input(s): "CHOL", "HDL", "LDLCALC", "TRIG", "CHOLHDL", "LDLDIRECT" in the last 72 hours. Thyroid Function Tests No results for input(s): "TSH", "T4TOTAL", "T3FREE", "THYROIDAB" in the last 72 hours.  Invalid input(s): "FREET3" _____________  ECHOCARDIOGRAM LIMITED  Result Date: 12/02/2022    ECHOCARDIOGRAM LIMITED REPORT   Patient Name:   [name] Date  of Exam: 12/02/2022 Medical Rec #:  [id number]     Height:       64.0 in Accession #:    [id number]    Weight:       214.0 lb Date of Birth:  Aug 10, 1945     BSA:          2.013 m Patient Age:    77 years      BP:           152/101 mmHg Patient Gender: F             HR:           65 bpm. Exam Location:  Inpatient Procedure: Limited Echo, Limited Color Doppler and Color Doppler Indications:    TAVR. Aortic stenosis  History:        Patient has prior history of Echocardiogram examinations, most                 recent 10/14/2022. History of breast cancer, Arrythmias:RBBB; Risk                 Factors:Hypertension, Former Smoker and Diabetes.                 Aortic Valve: 26 mm Sapien prosthetic, stented (TAVR) valve is                 present in the aortic position. Procedure Date: 12/02/22.  Sonographer:    Delcie Roch RDCS Referring Phys: [id number] [name]  Sonographer Comments: Image acquisition challenging due to breast implants. IMPRESSIONS  1. Left ventricular ejection fraction, by estimation, is 60 to 65%. The left ventricle has normal function. There is moderate left ventricular hypertrophy.  2. The mitral valve is abnormal. Mild mitral valve regurgitation.  3. Pre TAVR: severe AS Tri leaflet AV calcified mean No apical windows supine in cath lab Gradient suboptimal from subcostal and right sternal border Breast implants hinder imaging as well Mean gradient 18 peak 27 mmHg AVA 0.77 cm2          Post TAVR: well positioned 26 mm Sapien valve noted markedly prolonged PR interval on ECG tracing. No PVL Only able to get Pedof RSB CW gradients mean 4 mmHg peak 7 mmHg . The aortic valve has been repaired/replaced. There is a 26 mm Sapien prosthetic (TAVR) valve present in the aortic position. Procedure Date: 12/02/22. FINDINGS  Left Ventricle: Left ventricular ejection fraction, by estimation, is 60 to 65%. The left ventricle has normal function. The left ventricular internal cavity size was normal in size. There is moderate left ventricular hypertrophy. Pericardium: There is no evidence of pericardial effusion. Mitral Valve: The mitral valve is abnormal. There is mild thickening of the mitral valve leaflet(s). There is mild calcification of the mitral valve leaflet(s). Mild mitral valve regurgitation. Tricuspid Valve: Tricuspid valve regurgitation is mild. Aortic Valve: Pre TAVR: severe AS Tri leaflet AV calcified mean No apical windows supine in cath lab Gradient suboptimal from subcostal and right sternal border Breast implants hinder imaging as well Mean gradient 18 peak 27 mmHg AVA 0.77 cm2 Post TAVR: well positioned 26 mm Sapien valve noted markedly prolonged PR interval on ECG tracing. No PVL Only able to get Pedof RSB CW gradients mean 4 mmHg peak 7 mmHg. The aortic valve has been repaired/replaced. Aortic valve mean gradient measures 11.0 mmHg. Aortic valve peak gradient measures 15.1 mmHg. Aortic valve area, by VTI measures 0.52 cm. There is a 26 mm Sapien prosthetic, stented (  TAVR) valve present in the aortic position. Procedure Date: 12/02/22. Pulmonic Valve: Pulmonic valve regurgitation is trivial. Aorta: The aortic root is normal in size and structure. IAS/Shunts: No atrial level shunt detected by color flow Doppler. Additional Comments: Spectral Doppler performed. Color Doppler performed.  LEFT VENTRICLE PLAX 2D LVOT diam:     1.80 cm LV SV:         27 LV SV Index:   13 LVOT Area:     2.54 cm   AORTIC VALVE AV Area (Vmax):    0.61 cm AV Area (Vmean):   0.49 cm AV Area (VTI):     0.52 cm AV Vmax:           194.50 cm/s AV Vmean:          148.200 cm/s AV VTI:            0.513 m AV Peak Grad:      15.1 mmHg AV Mean Grad:      11.0 mmHg LVOT Vmax:         46.30 cm/s LVOT Vmean:        28.600 cm/s LVOT VTI:          0.105 m LVOT/AV VTI ratio: 0.20  AORTA Ao Root diam: 2.90 cm Ao Asc diam:  2.90 cm  SHUNTS Systemic VTI:  0.10 m Systemic Diam: 1.80 cm Charlton Haws MD Electronically signed by Charlton Haws MD Signature Date/Time: 12/02/2022/1:50:07 PM    Final    Structural Heart Procedure  Result Date: 12/02/2022 See surgical note for result.  DG Chest 2 View  Result Date: 12/02/2022 CLINICAL DATA:  Aortic valve stenosis. Hypertension. Diabetes mellitus. EXAM: CHEST - 2 VIEW COMPARISON:  Sep 01, 2022.  November 11, 2022. FINDINGS: Stable cardiomediastinal silhouette. Stable elevated right hemidiaphragm. Both lungs are clear. The visualized skeletal structures are unremarkable. IMPRESSION: No active cardiopulmonary disease. Electronically Signed   By: Lupita Raider M.D.   On: 12/02/2022 09:09   CT CORONARY MORPH W/CTA COR W/SCORE W/CA W/CM &/OR WO/CM  Addendum Date: 11/11/2022   ADDENDUM REPORT: 11/11/2022 13:36 EXAM: OVER-READ INTERPRETATION  CT CHEST The following report is an over-read performed by radiologist Dr. Jacob [facility] on 11/11/2022. This over-read does not include interpretation of cardiac or coronary anatomy or pathology. The cardiac TAVR interpretation by the cardiologist is attached. COMPARISON:  None. FINDINGS: Extracardiac findings will be described separately under dictation for contemporaneously obtained CTA chest, abdomen and pelvis. IMPRESSION: Please see separate dictation for contemporaneously obtained CTA chest, abdomen and pelvis dated 11/11/2022 for full description of relevant extracardiac findings. Electronically Signed   By: Allegra Lai M.D.    On: 11/11/2022 13:36   Result Date: 11/11/2022 CLINICAL DATA:  Severe Aortic Stenosis. EXAM: Cardiac TAVR CT TECHNIQUE: A non-contrast, gated CT scan was obtained with axial slices of 3 mm through the heart for aortic valve calcium scoring. A 100 kV retrospective, gated, contrast cardiac scan was obtained. Gantry rotation speed was 250 msecs and collimation was 0.6 mm. Nitroglycerin was not given. The 3D data set was reconstructed in 5% intervals of the 0-95% of the R-R cycle. Systolic and diastolic phases were analyzed on a dedicated workstation using MPR, MIP, and VRT modes. The patient received 100 cc of contrast. FINDINGS: Image quality: Excellent. Noise artifact is: Limited. Valve Morphology: Tricuspid aortic valve with diffuse severe calcifications. Bulky calcification of the RCC/NCC. Restricted leaflet movement in systole. Aortic Valve Calcium score: 1952 Aortic annular dimension: Phase  assessed: 25% Annular area: 464 mm2 Annular perimeter: 77.0 mm Max diameter: 26.6 mm Min diameter: 22.9 mm Annular and subannular calcification: No annular calcium. There is moderate subannular calcium under the LCC extending to the AMVL. Membranous septum length: 5.7 mm Optimal coplanar projection: LAO 17 CRA 2 Coronary Artery Height above Annulus: Left Main: 11.7 mm Right Coronary: 18.7 mm Sinus of Valsalva Measurements: Non-coronary: 31 mm Right-coronary: 29 mm Left-coronary: 32 mm Sinus of Valsalva Height: Non-coronary: 20.3 mm Right-coronary: 21.7 mm Left-coronary: 21.6 mm Sinotubular Junction: 28 mm Ascending Thoracic Aorta: 33 mm Coronary Arteries: Normal coronary origin. Left dominance. The study was performed without use of NTG and is insufficient for plaque evaluation. Please refer to recent cardiac catheterization for coronary assessment. Cardiac Morphology: Right Atrium: Right atrial size is within normal limits. Contrast reflux into the IVC consistent with elevated RA pressure. Right Ventricle: The right  ventricular cavity is within normal limits. Left Atrium: Left atrial size is normal in size with no left atrial appendage filling defect. Left Ventricle: The ventricular cavity size is within normal limits. Pulmonary arteries: Dilated pulmonary artery suggestive of pulmonary hypertension. Pulmonary veins: Normal pulmonary venous drainage. Pericardium: Normal thickness with no significant effusion or calcium present. Mitral Valve: The mitral valve is degenerative with moderate annular calcium of the AMVL that extends into the LVOT. Extra-cardiac findings: See attached radiology report for non-cardiac structures. IMPRESSION: 1. Annular measurements support a 26 mm S3 or 29 mm Evolut Pro. 2. There is moderate subannular calcium under the LCC extending to the AMVL. 3. Sufficient coronary to annulus distance. 4. Optimal Fluoroscopic Angle for Delivery: LAO 17 CRA 2 5. Dilated pulmonary artery suggestive of pulmonary hypertension. [facility] T. Flora Lipps, MD Electronically Signed: By: Lennie Odor M.D. On: 11/11/2022 13:27   CT ANGIO ABDOMEN PELVIS  W & WO CONTRAST  Result Date: 11/11/2022 CLINICAL DATA:  TAVR preop evaluation EXAM: CT ANGIOGRAPHY CHEST, ABDOMEN AND PELVIS TECHNIQUE: Multidetector CT imaging through the chest, abdomen and pelvis was performed using the standard protocol during bolus administration of intravenous contrast. Multiplanar reconstructed images and MIPs were obtained and reviewed to evaluate the vascular anatomy. RADIATION DOSE REDUCTION: This exam was performed according to the departmental dose-optimization program which includes automated exposure control, adjustment of the mA and/or kV according to patient size and/or use of iterative reconstruction technique. CONTRAST:  OMNIPAQUE IOHEXOL 350 MG/ML SOLN COMPARISON:  None Available. FINDINGS: CTA CHEST FINDINGS Cardiovascular: Heart is upper limits of normal in size. No pericardial effusion. Aortic valve thickening and calcifications.  Mitral annular calcifications. Normal caliber thoracic aorta with moderate atherosclerotic disease. Dilated main pulmonary artery, measuring up to 3.7 cm. Left main and three-vessel coronary artery calcifications. Mediastinum/Nodes: Small hiatal hernia. Thyroid is unremarkable. Surgical clips of the right axilla. No enlarged lymph nodes seen in the chest. Lungs/Pleura: Central airways are patent. No consolidation, pleural effusion or pneumothorax. Musculoskeletal: Postsurgical changes of the right breast. Left breast implant. No aggressive appearing osseous lesions. CTA ABDOMEN AND PELVIS FINDINGS Hepatobiliary: No focal liver abnormality is seen. Cholelithiasis. Evidence of gallbladder wall thickening. No biliary ductal dilation. Pancreas: Unremarkable. No pancreatic ductal dilatation or surrounding inflammatory changes. Spleen: Normal in size without focal abnormality. Adrenals/Urinary Tract: Bilateral adrenal glands are unremarkable. No hydronephrosis or nephrolithiasis. Bladder is unremarkable. Stomach/Bowel: Stomach is within normal limits. Appendix appears normal. No evidence of bowel wall thickening, distention, or inflammatory changes. Vascular/lymphatic: Normal caliber abdominal aorta with moderate atherosclerotic disease. No pathologically enlarged lymph nodes seen in the abdomen or  pelvis. Reproductive: No adnexal mass. Other: Small bilateral fat containing inguinal hernias. No abdominopelvic ascites. Musculoskeletal: No acute or significant osseous findings. VASCULAR MEASUREMENTS PERTINENT TO TAVR: AORTA: Minimal Aortic Diameter -  12.0 mm Severity of Aortic Calcification-moderate RIGHT PELVIS: Right Common Iliac Artery - Minimal Diameter-7.3 mm Tortuosity-mild Calcification-mild Right External Iliac Artery - Minimal Diameter-5.9 mm Tortuosity-moderate Calcification-mild Right Common Femoral Artery - Minimal Diameter-6.5 mm Tortuosity-none Calcification-none LEFT PELVIS: Left Common Iliac Artery -  Minimal Diameter-7.5 mm Tortuosity-none Calcification-moderate Left External Iliac Artery - Minimal Diameter-5.7 mm Tortuosity-moderate Calcification-mild Left Common Femoral Artery - Minimal Diameter-7.0 mm Tortuosity-mild Calcification-none Review of the MIP images confirms the above findings. IMPRESSION: 1. Vascular findings and measurements pertinent to potential TAVR procedure, as detailed above. 2. Thickening and calcification of the aortic valve, compatible with reported clinical history of aortic stenosis. 3. Moderate aortoiliac atherosclerosis. Left main and 3 vessel coronary artery disease. 4. Dilated main pulmonary artery, findings can be seen in the setting of pulmonary hypertension. Electronically Signed   By: Allegra Lai M.D.   On: 11/11/2022 12:14   CT ANGIO CHEST AORTA W/CM & OR WO/CM  Result Date: 11/11/2022 CLINICAL DATA:  TAVR preop evaluation EXAM: CT ANGIOGRAPHY CHEST, ABDOMEN AND PELVIS TECHNIQUE: Multidetector CT imaging through the chest, abdomen and pelvis was performed using the standard protocol during bolus administration of intravenous contrast. Multiplanar reconstructed images and MIPs were obtained and reviewed to evaluate the vascular anatomy. RADIATION DOSE REDUCTION: This exam was performed according to the departmental dose-optimization program which includes automated exposure control, adjustment of the mA and/or kV according to patient size and/or use of iterative reconstruction technique. CONTRAST:  OMNIPAQUE IOHEXOL 350 MG/ML SOLN COMPARISON:  None Available. FINDINGS: CTA CHEST FINDINGS Cardiovascular: Heart is upper limits of normal in size. No pericardial effusion. Aortic valve thickening and calcifications. Mitral annular calcifications. Normal caliber thoracic aorta with moderate atherosclerotic disease. Dilated main pulmonary artery, measuring up to 3.7 cm. Left main and three-vessel coronary artery calcifications. Mediastinum/Nodes: Small hiatal hernia.  Thyroid is unremarkable. Surgical clips of the right axilla. No enlarged lymph nodes seen in the chest. Lungs/Pleura: Central airways are patent. No consolidation, pleural effusion or pneumothorax. Musculoskeletal: Postsurgical changes of the right breast. Left breast implant. No aggressive appearing osseous lesions. CTA ABDOMEN AND PELVIS FINDINGS Hepatobiliary: No focal liver abnormality is seen. Cholelithiasis. Evidence of gallbladder wall thickening. No biliary ductal dilation. Pancreas: Unremarkable. No pancreatic ductal dilatation or surrounding inflammatory changes. Spleen: Normal in size without focal abnormality. Adrenals/Urinary Tract: Bilateral adrenal glands are unremarkable. No hydronephrosis or nephrolithiasis. Bladder is unremarkable. Stomach/Bowel: Stomach is within normal limits. Appendix appears normal. No evidence of bowel wall thickening, distention, or inflammatory changes. Vascular/lymphatic: Normal caliber abdominal aorta with moderate atherosclerotic disease. No pathologically enlarged lymph nodes seen in the abdomen or pelvis. Reproductive: No adnexal mass. Other: Small bilateral fat containing inguinal hernias. No abdominopelvic ascites. Musculoskeletal: No acute or significant osseous findings. VASCULAR MEASUREMENTS PERTINENT TO TAVR: AORTA: Minimal Aortic Diameter -  12.0 mm Severity of Aortic Calcification-moderate RIGHT PELVIS: Right Common Iliac Artery - Minimal Diameter-7.3 mm Tortuosity-mild Calcification-mild Right External Iliac Artery - Minimal Diameter-5.9 mm Tortuosity-moderate Calcification-mild Right Common Femoral Artery - Minimal Diameter-6.5 mm Tortuosity-none Calcification-none LEFT PELVIS: Left Common Iliac Artery - Minimal Diameter-7.5 mm Tortuosity-none Calcification-moderate Left External Iliac Artery - Minimal Diameter-5.7 mm Tortuosity-moderate Calcification-mild Left Common Femoral Artery - Minimal Diameter-7.0 mm Tortuosity-mild Calcification-none Review of the MIP  images confirms the above findings. IMPRESSION: 1. Vascular findings and measurements pertinent  to potential TAVR procedure, as detailed above. 2. Thickening and calcification of the aortic valve, compatible with reported clinical history of aortic stenosis. 3. Moderate aortoiliac atherosclerosis. Left main and 3 vessel coronary artery disease. 4. Dilated main pulmonary artery, findings can be seen in the setting of pulmonary hypertension. Electronically Signed   By: Allegra Lai M.D.   On: 11/11/2022 12:14   Disposition   Pt is being discharged home today in good condition.  Follow-up Plans & Appointments     Follow-up Information     Filbert Schilder, NP. Go on 12/10/2022.   Specialty: Cardiology Why: @ 9:05 am, please arrive at least 10 minutes early. Contact information: [address] [place] [address] [phone]                Discharge Instructions     Amb Referral to Cardiac Rehabilitation   Complete by: As directed    Diagnosis: Valve Replacement   Valve: Aortic Comment - TAVR   After initial evaluation and assessments completed: Virtual Based Care may be provided alone or in conjunction with Phase 2 Cardiac Rehab based on patient barriers.: Yes   Intensive Cardiac Rehabilitation (ICR) [facility] location only OR Traditional Cardiac Rehabilitation (TCR) *If criteria for ICR are not met will enroll in TCR [facility] only): Yes       Discharge Medications   Allergies as of 12/03/2022       Reactions   Penicillin G Anaphylaxis   Other Reaction(s): dry mouth Other Reaction(s): Rash/SOB   Penicillins Rash, Shortness Of Breath   Other Reaction(s): rash/sob   Bee Venom Swelling   Bee stings   Morphine Other (See Comments), Nausea And Vomiting        Medication List     TAKE these medications    albuterol 108 (90 Base) MCG/ACT inhaler Commonly known as: VENTOLIN HFA Inhale 2 puffs into the lungs every 4 (four) hours as needed for wheezing or  shortness of breath.   B COMPLEX PO Take 1 capsule by mouth daily.   COLLAGEN ULTRA PO Take 1 capsule by mouth daily as needed (Unknown).   lisinopril-hydrochlorothiazide 20-12.5 MG tablet Commonly known as: ZESTORETIC Take 1 tablet by mouth daily.   Magnesium 250 MG Tabs Take 250 mg by mouth daily as needed (Cramps).   MULTIVITAMINS PO Take 1 tablet by mouth daily.   naproxen sodium 220 MG tablet Commonly known as: ALEVE Take 440 mg by mouth daily.   Ozempic (0.25 or 0.5 MG/DOSE) 2 MG/3ML Sopn Generic drug: Semaglutide(0.25 or 0.5MG /DOS) Inject 0.25 mg into the skin every Saturday.   pantoprazole 40 MG tablet Commonly known as: PROTONIX Take 40 mg by mouth daily.   polyvinyl alcohol 1.4 % ophthalmic solution Commonly known as: LIQUIFILM TEARS Place 1 drop into both eyes as needed for dry eyes.   potassium chloride 10 MEQ tablet Commonly known as: KLOR-CON M Take 2 tablets (20 mEq total) by mouth 2 (two) times daily.   solifenacin 5 MG tablet Commonly known as: VESICARE Take 5 mg by mouth daily.   torsemide 10 MG tablet Commonly known as: DEMADEX Take 1 tablet (10 mg total) by mouth every morning.   vitamin C 100 MG tablet Take 100 mg by mouth daily.   Vitamin D3 125 MCG (5000 UT) Tabs Take 5,000 Units by mouth daily.   ZINC PO Take 10 mg by mouth daily.         Outstanding Labs/Studies   BMET  Duration of Discharge Encounter   Greater than 30 minutes including physician time.  Byrd Hesselbach, PA-C 12/03/2022, 9:33 AM [phone]

## 2022-12-02 NOTE — Interval H&P Note (Signed)
History and Physical Interval Note:  12/02/2022 11:36 AM  [name]  has presented today for surgery, with the diagnosis of Severe Aortic Stenosis.  The various methods of treatment have been discussed with the patient and family. After consideration of risks, benefits and other options for treatment, the patient has consented to  Procedure(s): Transcatheter Aortic Valve Replacement, Transfemoral (N/A) INTRAOPERATIVE TRANSTHORACIC ECHOCARDIOGRAM (N/A) as a surgical intervention.  The patient's history has been reviewed, patient examined, no change in status, stable for surgery.  I have reviewed the patient's chart and labs.  Questions were answered to the patient's satisfaction.     Alleen Borne

## 2022-12-02 NOTE — Op Note (Signed)
[facility]  TAVR OPERATIVE NOTE   Date of Procedure:  12/02/2022  Preoperative Diagnosis: Severe Aortic Stenosis   Postoperative Diagnosis: Same   Procedure:   Transcatheter Aortic Valve Replacement - Transfemoral Approach  Edwards Sapien 3 Resilia THV (size 26 mm, model # [id number])   Co-Surgeons:   Alleen Borne, MD and Alverda Skeans, MD Anesthesiologist:  Karna Christmas, MD  Echocardiographer:  Charlton Haws, MD  Pre-operative Echo Findings: Severe aortic stenosis Normal left ventricular systolic function  Post-operative Echo Findings: No paravalvular leak Normal left ventricular systolic function  Left Heart Catheterization Findings: Left ventricular end-diastolic pressure of   BRIEF CLINICAL NOTE AND INDICATIONS FOR SURGERY  The patient is a 7F with a history of breast cancer s/p chemotherapy, radiation, and bone marrow transplantation, diabetes mellitus, hypertension and paradoxical low flow low gradient symptomatic aortic stenosis referred for transcatheter aortic valve replacement with a 26mm Sapien 3 valve from the right transfemoral approach.  During the course of the patient's preoperative work up they have been evaluated comprehensively by a multidisciplinary team of specialists coordinated through the [facility] in the [facility].  They have been demonstrated to suffer from symptomatic severe aortic stenosis as noted above. The patient has been counseled extensively as to the relative risks and benefits of all options for the treatment of severe aortic stenosis including long term medical therapy, conventional surgery for aortic valve replacement, and transcatheter aortic valve replacement.  The patient has been independently evaluated by Dr. Laneta Simmers with CT surgery and they are felt to be at high risk for conventional surgical aortic valve replacement. The surgeon indicated the patient would be a  poor candidate for conventional surgery. Based upon review of all of the patient's preoperative diagnostic tests they are felt to be candidate for transcatheter aortic valve replacement using the transfemoral approach as an alternative to high risk conventional surgery.    Following the decision to proceed with transcatheter aortic valve replacement, a discussion has been held regarding what types of management strategies would be attempted intraoperatively in the event of life-threatening complications, including whether or not the patient would be considered a candidate for the use of cardiopulmonary bypass and/or conversion to open sternotomy for attempted surgical intervention.  The patient has been advised of a variety of complications that might develop peculiar to this approach including but not limited to risks of death, stroke, paravalvular leak, aortic dissection or other major vascular complications, aortic annulus rupture, device embolization, cardiac rupture or perforation, acute myocardial infarction, arrhythmia, heart block or bradycardia requiring permanent pacemaker placement, congestive heart failure, respiratory failure, renal failure, pneumonia, infection, other late complications related to structural valve deterioration or migration, or other complications that might ultimately cause a temporary or permanent loss of functional independence or other long term morbidity.  The patient provides full informed consent for the procedure as described and all questions were answered preoperatively.    DETAILS OF THE OPERATIVE PROCEDURE  PREPARATION:   The patient is brought to the operating room on the above mentioned date and central monitoring was established by the anesthesia team including placement of a radial arterial line. The patient is placed in the supine position on the operating table.  Intravenous antibiotics are administered. Conscious sedation is used.   Baseline transthoracic  echocardiogram was performed. The patient's chest, abdomen, both groins, and both lower extremities are prepared and draped in a sterile manner. A time out procedure is performed.  PERIPHERAL ACCESS:   Using the modified Seldinger technique, femoral arterial and venous access were obtained with placement of a 6 Fr sheath in the left femoral artery and a 7 Fr sheath in the right internal jugular vein using u/s guidance.  A 6Fr sheath was also placed in the right femoral vein.  A pigtail diagnostic catheter was passed through the femoral arterial sheath under fluoroscopic guidance into the aortic root.  A temporary transvenous pacemaker catheter was passed through the internal jugular venous sheath under fluoroscopic guidance into the right ventricle.  The pacemaker was tested to ensure stable lead placement and pacemaker capture. Aortic root angiography was performed in order to determine the optimal angiographic angle for valve deployment.  TRANSFEMORAL ACCESS:  A micropuncture kit was used to gain access to the right femoral artery using u/s guidance. Position confirmed with angiography. Pre-closure with double ProGlide closure devices. The patient was heparinized systemically and ACT verified > 250 seconds.    A 14 Fr transfemoral E-sheath was introduced into the right femoral artery after progressively dilating over an Amplatz superstiff wire. An AL-1 catheter was used to direct a straight-tip exchange length wire across the native aortic valve into the left ventricle. This was exchanged out for a pigtail catheter and position was confirmed in the LV apex. Simultaneous left ventricular, aortic, and left ventricular end-diastolic pressures were recorded.  The pigtail catheter was then exchanged for an Safari wire in the LV apex.  Direct LV pacing thresholds were assessed and found to be adequate.   TRANSCATHETER HEART VALVE DEPLOYMENT:  An Edwards Sapien 3 THV (size 26 mm) was prepared and crimped per  manufacturer's guidelines, and the proper orientation of the valve is confirmed on the [employer] delivery system. The valve was advanced through the introducer sheath using normal technique until in an appropriate position in the abdominal aorta beyond the sheath tip. The balloon was then retracted and using the fine-tuning wheel was centered on the valve. The valve was then advanced across the aortic arch using appropriate flexion of the catheter. The valve was carefully positioned across the aortic valve annulus. The Commander catheter was retracted using normal technique. Once final position of the valve has been confirmed by angiographic assessment, the valve is deployed while temporarily holding ventilation and during rapid ventricular pacing to maintain systolic blood pressure < 50 mmHg and pulse pressure < 10 mmHg. The balloon inflation is held for >3 seconds after reaching full deployment volume. The patient did develop asystole and was paced at 80 beats per minute.  No CPR was required.  The patient regained electrical activity and a normal if not elevated blood pressure.  Once the balloon has fully deflated the balloon is retracted into the ascending aorta and valve function is assessed using TTE. There is felt to be no paravalvular leak and no central aortic insufficiency.  The patient's hemodynamic recovery following valve deployment is good.  The deployment balloon and guidewire are both removed. Echo demostrated acceptable post-procedural gradients, stable mitral valve function, and no AI.   PROCEDURE COMPLETION:  The sheath was then removed and closure devices were completed. Protamine was administered once femoral arterial repair was complete. The temporary pacemaker, pigtail catheters and femoral sheaths were removed with a Mynx closure device placed in the artery and manual pressure used for venous hemostasis.    The patient tolerated the procedure well and is transported to the  surgical intensive care in stable condition. There were no immediate intraoperative complications. All  sponge instrument and needle counts are verified correct at completion of the operation.   No blood products were administered during the operation.  The patient received a total of 90 mL of intravenous contrast during the procedure.  [name] MD 12/02/2022 2:11 PM

## 2022-12-02 NOTE — Anesthesia Preprocedure Evaluation (Addendum)
Anesthesia Evaluation  Patient identified by MRN, date of birth, ID band Patient awake    Reviewed: Allergy & Precautions, NPO status , Patient's Chart, lab work & pertinent test results  Airway Mallampati: II  TM Distance: >3 FB Neck ROM: Full    Dental  (+) Lower Dentures, Upper Dentures   Pulmonary asthma , former smoker   Pulmonary exam normal breath sounds clear to auscultation       Cardiovascular hypertension, Pt. on medications + Valvular Problems/Murmurs AS  Rhythm:Regular Rate:Normal + Systolic murmurs    Neuro/Psych negative neurological ROS  negative psych ROS   GI/Hepatic Neg liver ROS, hiatal hernia,GERD  Medicated and Controlled,,  Endo/Other  diabetes  Patient on GLP-1 Agonist  Renal/GU negative Renal ROS     Musculoskeletal negative musculoskeletal ROS (+)    Abdominal  (+) + obese  Peds  Hematology negative hematology ROS (+)   Anesthesia Other Findings Severe Aortic Stenosis  Reproductive/Obstetrics                             Anesthesia Physical Anesthesia Plan  ASA: 4  Anesthesia Plan: MAC   Post-op Pain Management:    Induction: Intravenous  PONV Risk Score and Plan: 2 and Ondansetron, Dexamethasone and Treatment may vary due to age or medical condition  Airway Management Planned: Simple Face Mask  Additional Equipment:   Intra-op Plan:   Post-operative Plan:   Informed Consent: I have reviewed the patients History and Physical, chart, labs and discussed the procedure including the risks, benefits and alternatives for the proposed anesthesia with the patient or authorized representative who has indicated his/her understanding and acceptance.     Dental advisory given  Plan Discussed with: CRNA  Anesthesia Plan Comments:        Anesthesia Quick Evaluation

## 2022-12-02 NOTE — Op Note (Signed)
[facility]   MULTIDISCIPLINARY HEART VALVE TEAM   TAVR OPERATIVE NOTE   Date of Procedure:  12/02/2022  Preoperative Diagnosis: Severe Aortic Stenosis   Postoperative Diagnosis: Same   Procedure:   Transcatheter Aortic Valve Replacement - Percutaneous Right Transfemoral Approach  Edwards Sapien 3 Ultra Resilia THV (size 26 mm, model # 9755RSL, serial # [id number])   Co-Surgeons:  Alleen Borne, MD and Alverda Skeans, MD   Anesthesiologist:  Hyman Bower, MD  Echocardiographer:  P. Nishan, Md  Pre-operative Echo Findings: Severe aortic stenosis Normal left ventricular systolic function  Post-operative Echo Findings: No paravalvular leak Normal left ventricular systolic function   BRIEF CLINICAL NOTE AND INDICATIONS FOR SURGERY  This 77 year old woman has stage D3, low-flow/low gradient severe aortic stenosis with NYHA class III symptoms of exertional fatigue and shortness of breath consistent with chronic diastolic congestive heart failure.  She has also been having episodes of dizziness and presyncope as well as fullness in her epigastrium.  I have personally reviewed her TEE, cardiac catheterization, and CTA studies.  Her TEE shows a severely calcified aortic valve with leaflet thickening and restricted mobility.  The mean gradient was measured at 31 mmHg with a valve area 0.81 cm consistent with severe aortic stenosis.  Stroke-volume index was low at 32.  Left ventricular systolic function was normal.  Cardiac catheterization showed no coronary disease with an aortic valve simultaneous mean gradient of 25 mmHg with a valve area of 0.94 cm consistent with severe low-flow/low gradient aortic stenosis. I agree that aortic valve replacement is indicated in this patient for relief of her worsening symptoms and to prevent progressive left ventricular deterioration.  Given her age and comorbidities including prior bilateral breast cancer and reconstructions with  radiation therapy to her chest I think that transcatheter aortic valve replacement would be the best treatment option for her.  Her gated cardiac CTA shows anatomy suitable for TAVR using a 26 mm SAPIEN 3 valve.  Her abdominal and pelvic CTA shows adequate pelvic vascular anatomy to allow transfemoral insertion.   The patient and her husband were counseled at length regarding treatment alternatives for management of severe symptomatic aortic stenosis. The risks and benefits of surgical intervention has been discussed in detail. Long-term prognosis with medical therapy was discussed. Alternative approaches such as conventional surgical aortic valve replacement, transcatheter aortic valve replacement, and palliative medical therapy were compared and contrasted at length. This discussion was placed in the context of the patient's own specific clinical presentation and past medical history. All of their questions have been addressed.    Following the decision to proceed with transcatheter aortic valve replacement, a discussion was held regarding what types of management strategies would be attempted intraoperatively in the event of life-threatening complications, including whether or not the patient would be considered a candidate for the use of cardiopulmonary bypass and/or conversion to open sternotomy for attempted surgical intervention.  I think she would be a candidate for emergent sternotomy to manage any intraoperative complications.  The patient is aware of the fact that transient use of cardiopulmonary bypass may be necessary. The patient has been advised of a variety of complications that might develop including but not limited to risks of death, stroke, paravalvular leak, aortic dissection or other major vascular complications, aortic annulus rupture, device embolization, cardiac rupture or perforation, mitral regurgitation, acute myocardial infarction, arrhythmia, heart block or bradycardia requiring  permanent pacemaker placement, congestive heart failure, respiratory failure, renal failure, pneumonia, infection, other late complications  related to structural valve deterioration or migration, or other complications that might ultimately cause a temporary or permanent loss of functional independence or other long term morbidity. The patient provides full informed consent for the procedure as described and all questions were answered.       DETAILS OF THE OPERATIVE PROCEDURE  PREPARATION:    The patient was brought to the operating room on the above mentioned date and appropriate monitoring was established by the anesthesia team. The patient was placed in the supine position on the operating table.  Intravenous antibiotics were administered. The patient was monitored closely throughout the procedure under conscious sedation.    Baseline transthoracic echocardiogram was performed. The patient's abdomen and both groins were prepped and draped in a sterile manner. A time out procedure was performed.   PERIPHERAL ACCESS:    Using the modified Seldinger technique, femoral arterial and venous access was obtained with placement of 6 Fr sheaths on the left and right sides respectively.  A pigtail diagnostic catheter was passed through the left arterial sheath under fluoroscopic guidance into the aortic root.  A 7 Fr sheath was placed in the right internal jugular vein. A temporary transvenous pacemaker catheter was passed through the right internal jugular sheath under fluoroscopic guidance into the right ventricle.  The pacemaker was tested to ensure stable lead placement and pacemaker capture. Aortic root angiography was performed in order to determine the optimal angiographic angle for valve deployment.   TRANSFEMORAL ACCESS:   Percutaneous transfemoral access and sheath placement was performed using ultrasound guidance.  The right common femoral artery was cannulated using a micropuncture needle  and appropriate location was verified using hand injection angiogram.  A pair of Abbott Perclose percutaneous closure devices were placed and a 6 French sheath replaced into the femoral artery.  The patient was heparinized systemically and ACT verified > 250 seconds.    A 14 Fr transfemoral E-sheath was introduced into the right common femoral artery after progressively dilating over an Amplatz superstiff wire. An AL-1 catheter was used to direct a straight-tip exchange length wire across the native aortic valve into the left ventricle. This was exchanged out for a pigtail catheter and position was confirmed in the LV apex. Simultaneous LV and Ao pressures were recorded.  The pigtail catheter was exchanged for a Safari wire in the LV apex.   BALLOON AORTIC VALVULOPLASTY:   Not performed   TRANSCATHETER HEART VALVE DEPLOYMENT:   An Edwards Sapien 3 Ultra transcatheter heart valve (size 26 mm) was prepared and crimped per manufacturer's guidelines, and the proper orientation of the valve is confirmed on the [employer] delivery system. The valve was advanced through the introducer sheath using normal technique until in an appropriate position in the abdominal aorta beyond the sheath tip. The balloon was then retracted and using the fine-tuning wheel was centered on the valve. The valve was then advanced across the aortic arch using appropriate flexion of the catheter. The valve was carefully positioned across the aortic valve annulus. The Commander catheter was retracted using normal technique. Once final position of the valve has been confirmed by angiographic assessment, the valve is deployed during rapid ventricular pacing to maintain systolic blood pressure < 50 mmHg and pulse pressure < 10 mmHg. The balloon inflation is held for >3 seconds after reaching full deployment volume. Once the balloon has fully deflated the balloon is retracted into the ascending aorta and valve function is assessed  using echocardiography. There is felt to be  no paravalvular leak and no central aortic insufficiency.  The patient's hemodynamic recovery following valve deployment is good.  The deployment balloon and guidewire are both removed.    PROCEDURE COMPLETION:   The sheath was removed and femoral artery closure performed.  Protamine was administered once femoral arterial repair was complete. The temporary pacemaker, pigtail catheter and femoral sheaths were removed with manual pressure used for venous hemostasis.  A Mynx femoral closure device was utilized following removal of the diagnostic sheath in the left femoral artery.  The patient tolerated the procedure well and is transported to the cath lab recovery area in stable condition. There were no immediate intraoperative complications. All sponge instrument and needle counts are verified correct at completion of the operation.   No blood products were administered during the operation.  The patient received a total of 90 mL of intravenous contrast during the procedure.   Alleen Borne, MD 12/02/2022

## 2022-12-02 NOTE — Progress Notes (Signed)
  Echocardiogram 2D Echocardiogram has been performed.  Delcie Roch 12/02/2022, 1:44 PM

## 2022-12-02 NOTE — Progress Notes (Signed)
   12/02/22 1514  Vitals  BP (!) 157/73  MAP (mmHg) 96  BP Location Right Leg  BP Method Automatic  Patient Position (if appropriate) Lying  Pulse Rate 76  MEWS COLOR  MEWS Score Color Green  Oxygen Therapy  SpO2 100 %  O2 Device Room Air  MEWS Score  MEWS Temp 0  MEWS Systolic 0  MEWS Pulse 0  MEWS RR 0  MEWS LOC 0  MEWS Score 0  Expected discharge  Expected discharge date 12/03/22   Patient arrived from cath lab to 4e18, with B groins level zero no hematoma noted at this time. Vital signs obtained. And patient placed on monitor. CCMD made aware. Call bell with in reach. Bricelyn Freestone, Randall An RN

## 2022-12-03 ENCOUNTER — Inpatient Hospital Stay (HOSPITAL_COMMUNITY): Admit: 2022-12-03 | Payer: Medicare HMO

## 2022-12-03 ENCOUNTER — Encounter: Payer: Medicare HMO | Admitting: Surgery

## 2022-12-03 ENCOUNTER — Encounter (HOSPITAL_COMMUNITY): Payer: Self-pay | Admitting: Internal Medicine

## 2022-12-03 ENCOUNTER — Inpatient Hospital Stay (HOSPITAL_COMMUNITY): Payer: Medicare HMO

## 2022-12-03 DIAGNOSIS — I4510 Unspecified right bundle-branch block: Secondary | ICD-10-CM

## 2022-12-03 DIAGNOSIS — Z952 Presence of prosthetic heart valve: Secondary | ICD-10-CM | POA: Diagnosis not present

## 2022-12-03 DIAGNOSIS — I350 Nonrheumatic aortic (valve) stenosis: Secondary | ICD-10-CM

## 2022-12-03 DIAGNOSIS — I5033 Acute on chronic diastolic (congestive) heart failure: Secondary | ICD-10-CM | POA: Insufficient documentation

## 2022-12-03 DIAGNOSIS — Z954 Presence of other heart-valve replacement: Secondary | ICD-10-CM

## 2022-12-03 LAB — ECHOCARDIOGRAM COMPLETE
AR max vel: 3.53 cm2
AV Area VTI: 3.17 cm2
AV Area mean vel: 3.22 cm2
AV Mean grad: 4 mmHg
AV Peak grad: 6 mmHg
Ao pk vel: 1.22 m/s
Area-P 1/2: 5.38 cm2
Calc EF: 61.6 %
Height: 64 in
S' Lateral: 2.7 cm
Single Plane A2C EF: 60.5 %
Single Plane A4C EF: 61.9 %
Weight: 3322.77 [oz_av]

## 2022-12-03 LAB — BASIC METABOLIC PANEL
Anion gap: 13 (ref 5–15)
BUN: 11 mg/dL (ref 8–23)
CO2: 25 mmol/L (ref 22–32)
Calcium: 8.7 mg/dL — ABNORMAL LOW (ref 8.9–10.3)
Chloride: 100 mmol/L (ref 98–111)
Creatinine, Ser: 0.79 mg/dL (ref 0.44–1.00)
GFR, Estimated: >60 mL/min (ref >60)
Glucose, Bld: 173 mg/dL — ABNORMAL HIGH (ref 70–99)
Potassium: 3.9 mmol/L (ref 3.5–5.1)
Sodium: 138 mmol/L (ref 135–145)

## 2022-12-03 LAB — CBC
HCT: 40.8 % (ref 36.0–46.0)
Hemoglobin: 13.9 g/dL (ref 12.0–15.0)
MCH: 28.5 pg (ref 26.0–34.0)
MCHC: 34.1 g/dL (ref 30.0–36.0)
MCV: 83.8 fL (ref 80.0–100.0)
Platelets: 153 10*3/uL (ref 150–400)
RBC: 4.87 MIL/uL (ref 3.87–5.11)
RDW: 14.7 % (ref 11.5–15.5)
WBC: 8.8 10*3/uL (ref 4.0–10.5)
nRBC: 0 % (ref 0.0–0.2)

## 2022-12-03 LAB — MAGNESIUM: Magnesium: 1.5 mg/dL — ABNORMAL LOW (ref 1.7–2.4)

## 2022-12-03 MED ORDER — POTASSIUM CHLORIDE CRYS ER 10 MEQ PO TBCR: 20.0000 meq | EXTENDED_RELEASE_TABLET | Freq: Two times a day (BID) | ORAL | 6 refills | Status: DC

## 2022-12-03 NOTE — Progress Notes (Signed)
ZIO AT to be applied at hospital Dr. Lynnette Caffey to read.

## 2022-12-03 NOTE — Progress Notes (Signed)
  Echocardiogram 2D Echocardiogram has been performed.  [name] 12/03/2022, 10:35 AM

## 2022-12-03 NOTE — Anesthesia Postprocedure Evaluation (Signed)
Anesthesia Post Note  Patient: JUHEE COOLEY  Procedure(s) Performed: Transcatheter Aortic Valve Replacement, Transfemoral INTRAOPERATIVE TRANSTHORACIC ECHOCARDIOGRAM     Patient location during evaluation: Cath Lab Anesthesia Type: MAC Level of consciousness: awake Pain management: pain level controlled Vital Signs Assessment: post-procedure vital signs reviewed and stable Respiratory status: spontaneous breathing, nonlabored ventilation and respiratory function stable Cardiovascular status: blood pressure returned to baseline and stable Postop Assessment: no apparent nausea or vomiting Anesthetic complications: no   There were no known notable events for this encounter.  Last Vitals:  Vitals:   12/03/22 0300 12/03/22 0431  BP: (!) 157/73 (!) 148/76  Pulse: 100 98  Resp: 20 17  Temp: (!) 38.1 C (!) 38.1 C  SpO2: 96% 96%    Last Pain:  Vitals:   12/03/22 0431  TempSrc: Oral  PainSc:                  Catheryn Bacon Horace Lukas

## 2022-12-03 NOTE — Plan of Care (Signed)
?  Problem: Education: ?Goal: Knowledge of General Education information will improve ?Description: Including pain rating scale, medication(s)/side effects and non-pharmacologic comfort measures ?Outcome: Adequate for Discharge ?  ?Problem: Health Behavior/Discharge Planning: ?Goal: Ability to manage health-related needs will improve ?Outcome: Adequate for Discharge ?  ?Problem: Clinical Measurements: ?Goal: Ability to maintain clinical measurements within normal limits will improve ?Outcome: Adequate for Discharge ?Goal: Will remain free from infection ?Outcome: Adequate for Discharge ?Goal: Diagnostic test results will improve ?Outcome: Adequate for Discharge ?Goal: Respiratory complications will improve ?Outcome: Adequate for Discharge ?Goal: Cardiovascular complication will be avoided ?Outcome: Adequate for Discharge ?  ?Problem: Activity: ?Goal: Risk for activity intolerance will decrease ?Outcome: Adequate for Discharge ?  ?Problem: Nutrition: ?Goal: Adequate nutrition will be maintained ?Outcome: Adequate for Discharge ?  ?Problem: Coping: ?Goal: Level of anxiety will decrease ?Outcome: Adequate for Discharge ?  ?Problem: Elimination: ?Goal: Will not experience complications related to urinary retention ?Outcome: Adequate for Discharge ?  ?Problem: Pain Managment: ?Goal: General experience of comfort will improve ?Outcome: Adequate for Discharge ?  ?Problem: Safety: ?Goal: Ability to remain free from injury will improve ?Outcome: Adequate for Discharge ?  ?Problem: Skin Integrity: ?Goal: Risk for impaired skin integrity will decrease ?Outcome: Adequate for Discharge ?  ?

## 2022-12-03 NOTE — Progress Notes (Signed)
Discharge instructions (including medications) discussed with and copy provided to patient/caregiver 

## 2022-12-03 NOTE — Progress Notes (Signed)
1 Day Post-Op Procedure(s) (LRB): Transcatheter Aortic Valve Replacement, Transfemoral (N/A) INTRAOPERATIVE TRANSTHORACIC ECHOCARDIOGRAM (N/A) Subjective:  She feels well. Made a lot of urine yesterday with lasix. No SOB. She ambulated without difficulty.  Objective: Vital signs in last 24 hours: Temp:  [97.7 F (36.5 C)-100.5 F (38.1 C)] 98.3 F (36.8 C) (08/21 0729) Pulse Rate:  [61-112] 112 (08/21 0729) Cardiac Rhythm: Sinus tachycardia (08/21 0400) Resp:  [12-20] 19 (08/21 0729) BP: (119-159)/(47-130) 156/89 (08/21 0729) SpO2:  [90 %-100 %] 96 % (08/21 0729) Weight:  [94.2 kg-97.1 kg] 94.2 kg (08/21 0447)  Hemodynamic parameters for last 24 hours:    Intake/Output from previous day: 08/20 0701 - 08/21 0700 In: 1127.9 [P.O.:240; I.V.:787.9; IV Piggyback:100] Out: 1410 [Urine:1400; Blood:10] Intake/Output this shift: No intake/output data recorded.  General appearance: alert and cooperative Neurologic: intact Heart: regular rate and rhythm, S1, S2 normal, no murmur Lungs: clear to auscultation bilaterally Extremities: no edema Wound: groin sites look good.  Lab Results: Recent Labs    12/01/22 0900 12/02/22 1319 12/03/22 0642  WBC 6.0  --  8.8  HGB 13.9 12.2 13.9  HCT 42.1 36.0 40.8  PLT 175  --  153   BMET:  Recent Labs    12/02/22 1845 12/03/22 0642  NA 139 138  K 3.5 3.9  CL 98 100  CO2 27 25  GLUCOSE 134* 173*  BUN 13 11  CREATININE 0.74 0.79  CALCIUM 8.8* 8.7*    PT/INR:  Recent Labs    12/01/22 0900  LABPROT 12.3  INR 0.9   ABG    Component Value Date/Time   PHART 7.382 10/14/2022 1003   HCO3 29.7 (H) 10/14/2022 1003   HCO3 28.2 (H) 10/14/2022 1003   TCO2 31 12/02/2022 1319   O2SAT 66 10/14/2022 1003   O2SAT 93 10/14/2022 1003   CBG (last 3)  Recent Labs    12/02/22 0920 12/02/22 1606  GLUCAP 155* 127*    Assessment/Plan: S/P Procedure(s) (LRB): Transcatheter Aortic Valve Replacement, Transfemoral (N/A) INTRAOPERATIVE  TRANSTHORACIC ECHOCARDIOGRAM (N/A)  POD 1 Hemodynamically stable in sinus rhythm. Old RBBB on ECG. No bradycardia or higher grade block on monitor.  Low grade temp early this am to 100.5 but back to normal now. May be related to IV.  Plan 2D echo this am and home after that.    LOS: 1 day    Alleen Borne 12/03/2022

## 2022-12-03 NOTE — Progress Notes (Signed)
CARDIAC REHAB PHASE I   PRE:  Rate/Rhythm: 104 ST    BP: sitting 119/73    SpO2: 94 RA  MODE:  Ambulation: 180 ft   POST:  Rate/Rhythm: 115 ST    BP: sitting 136/84     SpO2: 95 RA  Pt ambulated with her cane and occasionally holding to the railing. She sts this is her norm. Sts less SOB walking than PTA. Slow pace.   Discussed with pt restrictions, walking for exercise, diet, and CRPII. Pt receptive. Will refer to [place] CRPII. [id number]   Ethelda Chick BS, ACSM-CEP 12/03/2022 9:17 AM

## 2022-12-04 ENCOUNTER — Encounter (HOSPITAL_COMMUNITY)
Admission: EM | Disposition: A | Discharge status: Home / Self Care | Payer: Self-pay | Source: Home / Self Care | Attending: Emergency Medicine

## 2022-12-04 ENCOUNTER — Encounter (HOSPITAL_COMMUNITY): Payer: Self-pay

## 2022-12-04 ENCOUNTER — Telehealth: Payer: Self-pay | Admitting: Cardiovascular Disease

## 2022-12-04 ENCOUNTER — Telehealth: Payer: Self-pay | Admitting: Student

## 2022-12-04 ENCOUNTER — Inpatient Hospital Stay (HOSPITAL_COMMUNITY): Payer: Medicare HMO

## 2022-12-04 ENCOUNTER — Observation Stay (HOSPITAL_COMMUNITY)
Admission: EM | Admit: 2022-12-04 | Discharge: 2022-12-05 | Disposition: A | Discharge status: Home / Self Care | Payer: Medicare HMO | Attending: Cardiology | Admitting: Cardiology

## 2022-12-04 ENCOUNTER — Other Ambulatory Visit: Payer: Self-pay

## 2022-12-04 ENCOUNTER — Ambulatory Visit (HOSPITAL_COMMUNITY)
Admission: EM | Disposition: A | Discharge status: Home / Self Care | Payer: Self-pay | Source: Home / Self Care | Attending: Emergency Medicine

## 2022-12-04 DIAGNOSIS — I442 Atrioventricular block, complete: Secondary | ICD-10-CM | POA: Diagnosis not present

## 2022-12-04 DIAGNOSIS — Z87891 Personal history of nicotine dependence: Secondary | ICD-10-CM | POA: Diagnosis not present

## 2022-12-04 DIAGNOSIS — Z7985 Long-term (current) use of injectable non-insulin antidiabetic drugs: Secondary | ICD-10-CM | POA: Insufficient documentation

## 2022-12-04 DIAGNOSIS — I700 Atherosclerosis of aorta: Secondary | ICD-10-CM | POA: Diagnosis not present

## 2022-12-04 DIAGNOSIS — Z952 Presence of prosthetic heart valve: Secondary | ICD-10-CM | POA: Insufficient documentation

## 2022-12-04 DIAGNOSIS — Z79899 Other long term (current) drug therapy: Secondary | ICD-10-CM | POA: Diagnosis not present

## 2022-12-04 DIAGNOSIS — I350 Nonrheumatic aortic (valve) stenosis: Secondary | ICD-10-CM | POA: Diagnosis not present

## 2022-12-04 DIAGNOSIS — I10 Essential (primary) hypertension: Secondary | ICD-10-CM | POA: Insufficient documentation

## 2022-12-04 DIAGNOSIS — R001 Bradycardia, unspecified: Secondary | ICD-10-CM | POA: Insufficient documentation

## 2022-12-04 DIAGNOSIS — E119 Type 2 diabetes mellitus without complications: Secondary | ICD-10-CM | POA: Diagnosis not present

## 2022-12-04 DIAGNOSIS — Z853 Personal history of malignant neoplasm of breast: Secondary | ICD-10-CM | POA: Diagnosis not present

## 2022-12-04 DIAGNOSIS — Z950 Presence of cardiac pacemaker: Secondary | ICD-10-CM | POA: Diagnosis not present

## 2022-12-04 HISTORY — PX: PACEMAKER IMPLANT: EP1218

## 2022-12-04 HISTORY — PX: TEMPORARY PACEMAKER: CATH118268

## 2022-12-04 LAB — COMPREHENSIVE METABOLIC PANEL
ALT: 23 U/L (ref 0–44)
AST: 46 U/L — ABNORMAL HIGH (ref 15–41)
Albumin: 3.6 g/dL (ref 3.5–5.0)
Alkaline Phosphatase: 50 U/L (ref 38–126)
Anion gap: 10 (ref 5–15)
BUN: 12 mg/dL (ref 8–23)
CO2: 26 mmol/L (ref 22–32)
Calcium: 8.9 mg/dL (ref 8.9–10.3)
Chloride: 100 mmol/L (ref 98–111)
Creatinine, Ser: 0.74 mg/dL (ref 0.44–1.00)
GFR, Estimated: >60 mL/min (ref >60)
Glucose, Bld: 178 mg/dL — ABNORMAL HIGH (ref 70–99)
Potassium: 3.3 mmol/L — ABNORMAL LOW (ref 3.5–5.1)
Sodium: 136 mmol/L (ref 135–145)
Total Bilirubin: 1.2 mg/dL (ref 0.3–1.2)
Total Protein: 6.9 g/dL (ref 6.5–8.1)

## 2022-12-04 LAB — CBC WITH DIFFERENTIAL/PLATELET
Abs Immature Granulocytes: 0.02 10*3/uL (ref 0.00–0.07)
Basophils Absolute: 0 10*3/uL (ref 0.0–0.1)
Basophils Relative: 1 %
Eosinophils Absolute: 0 10*3/uL (ref 0.0–0.5)
Eosinophils Relative: 1 %
HCT: 44.7 % (ref 36.0–46.0)
Hemoglobin: 14.7 g/dL (ref 12.0–15.0)
Immature Granulocytes: 0 %
Lymphocytes Relative: 22 %
Lymphs Abs: 1.9 10*3/uL (ref 0.7–4.0)
MCH: 27.7 pg (ref 26.0–34.0)
MCHC: 32.9 g/dL (ref 30.0–36.0)
MCV: 84.2 fL (ref 80.0–100.0)
Monocytes Absolute: 1 10*3/uL (ref 0.1–1.0)
Monocytes Relative: 11 %
Neutro Abs: 5.7 10*3/uL (ref 1.7–7.7)
Neutrophils Relative %: 65 %
Platelets: 144 10*3/uL — ABNORMAL LOW (ref 150–400)
RBC: 5.31 MIL/uL — ABNORMAL HIGH (ref 3.87–5.11)
RDW: 14.7 % (ref 11.5–15.5)
WBC: 8.7 10*3/uL (ref 4.0–10.5)
nRBC: 0 % (ref 0.0–0.2)

## 2022-12-04 LAB — MAGNESIUM: Magnesium: 1.7 mg/dL (ref 1.7–2.4)

## 2022-12-04 LAB — TSH: TSH: 3.059 u[IU]/mL (ref 0.350–4.500)

## 2022-12-04 SURGERY — PACEMAKER IMPLANT

## 2022-12-04 SURGERY — TEMPORARY PACEMAKER
Anesthesia: LOCAL

## 2022-12-04 MED ORDER — SODIUM CHLORIDE 0.9 % IV SOLN
80.0000 mg | INTRAVENOUS | Status: AC
  Administered 2022-12-04: 80 mg
  Filled 2022-12-04: qty 2

## 2022-12-04 MED ORDER — SODIUM CHLORIDE 0.9 % IV SOLN
250.0000 mL | INTRAVENOUS | Status: DC
  Administered 2022-12-04: 250 mL via INTRAVENOUS

## 2022-12-04 MED ORDER — HEPARIN (PORCINE) IN NACL 1000-0.9 UT/500ML-% IV SOLN
INTRAVENOUS | Status: DC | PRN
  Administered 2022-12-04: 500 mL

## 2022-12-04 MED ORDER — MIDAZOLAM HCL 2 MG/2ML IJ SOLN
INTRAMUSCULAR | Status: AC
  Filled 2022-12-04: qty 2

## 2022-12-04 MED ORDER — MAGNESIUM SULFATE 2 GM/50ML IV SOLN
2.0000 g | Freq: Once | INTRAVENOUS | Status: AC
  Administered 2022-12-04: 2 g via INTRAVENOUS
  Filled 2022-12-04: qty 50

## 2022-12-04 MED ORDER — FENTANYL CITRATE (PF) 100 MCG/2ML IJ SOLN
INTRAMUSCULAR | Status: AC
  Filled 2022-12-04: qty 2

## 2022-12-04 MED ORDER — ALBUTEROL SULFATE (2.5 MG/3ML) 0.083% IN NEBU: 2.5000 mg | INHALATION_SOLUTION | RESPIRATORY_TRACT | Status: DC | PRN

## 2022-12-04 MED ORDER — POTASSIUM CHLORIDE 10 MEQ/100ML IV SOLN
10.0000 meq | INTRAVENOUS | Status: AC
  Administered 2022-12-04 (×3): 10 meq via INTRAVENOUS
  Filled 2022-12-04 (×3): qty 100

## 2022-12-04 MED ORDER — MIDAZOLAM HCL 5 MG/5ML IJ SOLN
INTRAMUSCULAR | Status: DC | PRN
  Administered 2022-12-04 (×2): 1 mg via INTRAVENOUS

## 2022-12-04 MED ORDER — LIDOCAINE HCL (PF) 1 % IJ SOLN
INTRAMUSCULAR | Status: DC | PRN
  Administered 2022-12-04: 60 mL

## 2022-12-04 MED ORDER — VANCOMYCIN HCL IN DEXTROSE 1-5 GM/200ML-% IV SOLN
1000.0000 mg | INTRAVENOUS | Status: AC
  Administered 2022-12-04: 1000 mg via INTRAVENOUS
  Filled 2022-12-04: qty 200

## 2022-12-04 MED ORDER — FENTANYL CITRATE (PF) 100 MCG/2ML IJ SOLN
INTRAMUSCULAR | Status: DC | PRN
  Administered 2022-12-04 (×2): 25 ug via INTRAVENOUS

## 2022-12-04 MED ORDER — ACETAMINOPHEN 325 MG PO TABS
650.0000 mg | ORAL_TABLET | ORAL | Status: DC | PRN
  Administered 2022-12-05: 650 mg via ORAL
  Filled 2022-12-04: qty 2

## 2022-12-04 MED ORDER — ONDANSETRON HCL 4 MG/2ML IJ SOLN: 4.0000 mg | Freq: Four times a day (QID) | INTRAMUSCULAR | Status: DC | PRN

## 2022-12-04 MED ORDER — LIDOCAINE HCL (PF) 1 % IJ SOLN
INTRAMUSCULAR | Status: AC
  Filled 2022-12-04: qty 30

## 2022-12-04 MED ORDER — MIDAZOLAM HCL 2 MG/2ML IJ SOLN
INTRAMUSCULAR | Status: DC | PRN
  Administered 2022-12-04: 1 mg via INTRAVENOUS

## 2022-12-04 MED ORDER — SODIUM CHLORIDE 0.9 % IV SOLN: INTRAVENOUS | Status: DC

## 2022-12-04 MED ORDER — SODIUM CHLORIDE 0.9 % IV SOLN
INTRAVENOUS | Status: AC
  Filled 2022-12-04: qty 2

## 2022-12-04 MED ORDER — FENTANYL CITRATE (PF) 100 MCG/2ML IJ SOLN
INTRAMUSCULAR | Status: DC | PRN
  Administered 2022-12-04: 25 ug via INTRAVENOUS

## 2022-12-04 MED ORDER — PANTOPRAZOLE SODIUM 40 MG PO TBEC
40.0000 mg | DELAYED_RELEASE_TABLET | Freq: Every day | ORAL | Status: DC
  Administered 2022-12-05: 40 mg via ORAL
  Filled 2022-12-04: qty 1

## 2022-12-04 MED ORDER — ALBUTEROL SULFATE HFA 108 (90 BASE) MCG/ACT IN AERS: 2.0000 | INHALATION_SPRAY | RESPIRATORY_TRACT | Status: DC | PRN

## 2022-12-04 MED ORDER — CHLORHEXIDINE GLUCONATE CLOTH 2 % EX PADS
6.0000 | MEDICATED_PAD | Freq: Every day | CUTANEOUS | Status: DC
  Administered 2022-12-05: 6 via TOPICAL

## 2022-12-04 MED ORDER — SODIUM CHLORIDE 0.9% FLUSH
3.0000 mL | Freq: Two times a day (BID) | INTRAVENOUS | Status: DC
  Administered 2022-12-04 – 2022-12-05 (×3): 3 mL via INTRAVENOUS

## 2022-12-04 MED ORDER — LIDOCAINE HCL (PF) 1 % IJ SOLN
INTRAMUSCULAR | Status: DC | PRN
  Administered 2022-12-04: 5 mL

## 2022-12-04 MED ORDER — SODIUM CHLORIDE 0.9% FLUSH: 3.0000 mL | INTRAVENOUS | Status: DC | PRN

## 2022-12-04 MED ORDER — CEFAZOLIN SODIUM-DEXTROSE 2-4 GM/100ML-% IV SOLN
INTRAVENOUS | Status: AC
  Filled 2022-12-04: qty 100

## 2022-12-04 SURGICAL SUPPLY — 9 items
CATH-GARD ARROW CATH SHIELD (MISCELLANEOUS) ×1
GLIDESHEATH SLEND SS 6F .021 (SHEATH) IMPLANT
PACK CARDIAC CATHETERIZATION (CUSTOM PROCEDURE TRAY) IMPLANT
PROTECTION STATION PRESSURIZED (MISCELLANEOUS) ×1
SHEATH PINNACLE 6F 10CM (SHEATH) IMPLANT
SHIELD CATHGARD ARROW (MISCELLANEOUS) IMPLANT
STATION PROTECTION PRESSURIZED (MISCELLANEOUS) IMPLANT
WIRE MICRO SET SILHO 5FR 7 (SHEATH) IMPLANT
WIRE PACING TEMP ST TIP 5 (CATHETERS) IMPLANT

## 2022-12-04 SURGICAL SUPPLY — 15 items
CABLE SURGICAL S-101-97-12 (CABLE) ×1 IMPLANT
CATH CPS LOCATOR 3D MED (CATHETERS) IMPLANT
GUIDEWIRE VASC J-TIP .035X150 (WIRE) IMPLANT
HELIX LOCKING TOOL (MISCELLANEOUS) ×1
LEAD ULTIPACE 52 LPA1231/52 (Lead) IMPLANT
LEAD ULTIPACE 65 LPA1231/65 (Lead) IMPLANT
PACEMAKER ASSURITY DR-RF (Pacemaker) IMPLANT
PAD DEFIB RADIO PHYSIO CONN (PAD) ×1 IMPLANT
SHEATH 7FR PRELUDE SNAP 13 (SHEATH) IMPLANT
SHEATH 9FR PRELUDE SNAP 13 (SHEATH) IMPLANT
SHEATH PROBE COVER 6X72 (BAG) IMPLANT
SLITTER AGILIS HISPRO (INSTRUMENTS) IMPLANT
TOOL HELIX LOCKING (MISCELLANEOUS) IMPLANT
TRAY PACEMAKER INSERTION (PACKS) ×1 IMPLANT
WIRE HI TORQ VERSACORE-J 145CM (WIRE) IMPLANT

## 2022-12-04 NOTE — ED Notes (Signed)
Patient experienced pauses up to 6 seconds. This RN and Raymar evaluated pulse, MD Laurenzo aware.

## 2022-12-04 NOTE — ED Notes (Signed)
Patient placed on zoll pads 

## 2022-12-04 NOTE — H&P (Addendum)
Cardiology Admission History and Physical   Patient ID: [name] MRN: [id number]; DOB: [birth year]   Admission date: 12/04/2022  PCP:  Joycelyn Rua, MD   [facility] Providers Cardiologist:  Tessa Lerner, DO      Chief Complaint:  symptomatic bradycardia  Patient Profile:   [name] is a 77 y.o. female with breast cancer, former smoker, DM, HTN,  who is being seen 12/04/2022 for the evaluation of post TAVR pausing/bradycardia.  History of Present Illness:   [name] was of late found to have severe AS via her attending cardiologist Dr. Odis Hollingshead, referred to the structural team with progressive symptoms W/u noted Echo in May 2024 with LVEF=60-65%. The AV was not well seen.  TEE July 2024 with LVEF=55-60%. Normal RV size and function. Mild to moderate mitral regurgitation. Moderately severe aortic stenosis with mean gradient 31 mmHg, peak gradient 51 mmHg, AVA 0.80 cm2, DI 0.28.  Cardiac cath 10/14/22 with no significant CAD. Mean PA pressure 31 mmHg, PCWP 17 mmHg. Aortic valve mean gradient 25 mmHg, AVA 0.94 cm2.   She underwent TAVR 12/02/22, during valve deployment she had a brief asystolic event with spontaneous return of conduction with need to pace/CPR. Discharged yesterday with Zio AT On NO NODAL blocking agents  Early this morning she had an episode of CHB associated with V pause of 9.4 seconds and advised to proceed to the ER. She arrives in CHB, V rates 70's with intermittent CHB/V standstill    Chem pending WBC 8.7H/H 14/44 Plts 144  Pt denies any CP, SOB, she has been having waves of severe lightheadedness all morning, no syncope   Past Medical History:  Diagnosis Date   Allergic rhinitis    Asthma    Childhood per patient   Breast cancer, left (HCC) 2007   ductal carcinoma in situ, underwent left mastectomy 07/2005; reconstructive surgery with expander, permanent implant placed 03/2006, hospitalized January 2008 with breast cellulitis.    Breast cancer, right (HCC) 1998   1998; modified radical right mastectomy 1998 for infiltrating ductal adenocarcinoma with 7 of 12 axillary nodes positive. Underwent adjuvant chemotherapy and bone marrow transplant by Dr Greggory Stallion as well as radiation therapy. Also had Tram flap reconstruction.   Diabetes mellitus without complication (HCC)    GERD (gastroesophageal reflux disease)    Hiatal hernia    History of ventricular tachycardia    Hypertension    Vitamin D deficiency     Past Surgical History:  Procedure Laterality Date   biopsy on scalp  2010   breast implant Left 2007   CESAREAN SECTION     childbirth     63, 66, 67, 70   EYE SURGERY  1952   hysterectomy with one ovary/due to fibroids  1987   INTRAOPERATIVE TRANSTHORACIC ECHOCARDIOGRAM N/A 12/02/2022   Procedure: INTRAOPERATIVE TRANSTHORACIC ECHOCARDIOGRAM;  Surgeon: [name], MD;  Location: [facility];  Service: Open Heart Surgery;  Laterality: N/A;   left mastectomy Left 2007   NASAL SEPTUM SURGERY  1975   PORTACATH PLACEMENT  1998   right mastectomy Right 1998   RIGHT/LEFT HEART CATH AND CORONARY ANGIOGRAPHY N/A 10/14/2022   Procedure: RIGHT/LEFT HEART CATH AND CORONARY ANGIOGRAPHY;  Surgeon: Elder Negus, MD;  Location: [facility] INVASIVE CV LAB;  Service: Cardiovascular;  Laterality: N/A;   stem cell infusion  1998   surgery for crossed eyes     TEE WITHOUT CARDIOVERSION N/A 10/14/2022   Procedure: TRANSESOPHAGEAL ECHOCARDIOGRAM;  Surgeon:  Patwardhan, Anabel Bene, MD;  Location: [facility] INVAS[facility] CV [facility];  Service: Cardiovascular;  Laterality: N/A;   TONSILLECTOMY  1961   tran flap R breast Right 1998   TRANSCATHETER AORTIC VALVE REPLACEMENT, TRANSFEMORAL N/A 12/02/2022   Procedure: Transcatheter Aortic Valve Replacement, Transfemoral;  Surgeon: [name], MD;  Location: [facility]VASIVE CV [facility];  Service: Open Heart Surgery;  Laterality: N/A;   tri-port  1998   TUBAL LIGATION       Medications Prior to  Admission: Prior to Admission medications   Medication Sig Start Date End Date Taking? Authorizing Provider  albuterol (VENTOLIN HFA) 108 (90 Base) MCG/ACT inhaler Inhale 2 puffs into the lungs every 4 (four) hours as needed for wheezing or shortness of breath. 04/13/18  Yes [provider]  Ascorbic Acid (VITAMIN C) 100 MG tablet Take 100 mg by mouth daily.   Yes [provider]  B Complex Vitamins (B COMPLEX PO) Take 1 capsule by mouth daily.   Yes [provider]  Cholecalciferol (VITAMIN D3) 125 MCG (5000 UT) TABS Take 5,000 Units by mouth daily.   Yes [provider]  lisinopril-hydrochlorothiazide (ZESTORETIC) 20-12.5 MG tablet Take 1 tablet by mouth daily.   Yes [provider]  Multiple Vitamin (MULTIVITAMINS PO) Take 1 tablet by mouth daily.   Yes [provider]  Multiple Vitamins-Minerals (ZINC PO) Take 10 mg by mouth daily.   Yes [provider]  naproxen sodium (ALEVE) 220 MG tablet Take 440 mg by mouth daily.   Yes [provider]  OZEMPIC, 0.25 OR 0.5 MG/DOSE, 2 MG/3ML SOPN Inject 0.25 mg into the skin every Saturday.   Yes [provider]  pantoprazole (PROTONIX) 40 MG tablet Take 40 mg by mouth daily.   Yes [provider]  polyvinyl alcohol (LIQUIFILM TEARS) 1.4 % ophthalmic solution Place 1 drop into both eyes as needed for dry eyes.   Yes [provider]  potassium chloride SA (KLOR-CON M) 10 MEQ tablet Take 2 tablets (20 mEq total) by mouth 2 (two) times daily. 12/03/22  Yes Janetta Hora, PA-C  solifenacin (VESICARE) 5 MG tablet Take 5 mg by mouth daily. 11/06/22  Yes [provider]  Specialty Vitamins Products (COLLAGEN ULTRA PO) Take 1 capsule by mouth daily as needed (Unknown).   Yes [provider]  torsemide (DEMADEX) 10 MG tablet Take 1 tablet (10 mg total) by mouth every morning. 11/18/22  Yes Tolia, Sunit, DO     Allergies:    Allergies  Allergen  Reactions   Penicillin G Anaphylaxis    Other Reaction(s): dry mouth  Other Reaction(s): Rash/SOB   Penicillins Rash and Shortness Of Breath    Other Reaction(s): rash/sob   Bee Venom Swelling    Bee stings   Morphine Other (See Comments) and Nausea And Vomiting    Social History:   Social History   Socioeconomic History   Marital status: Married    Spouse name: Not on file   Number of children: 4   Years of education: Not on file   Highest education level: Some college, no degree  Occupational History   Occupation: Semi-retired working for [employer]  Tobacco Use   Smoking status: Former    Current packs/day: 0.00    Average packs/day: 1 pack/day for 36.0 years (36.0 ttl pk-yrs)    Types: Cigarettes    Start date: 30    Quit date: 1998    Years since quitting: 26.6   Smokeless tobacco:  Never   Tobacco comments:    At first maybe 1 pack per week then it moved up to 1 ppd later.  Vaping Use   Vaping status: Never Used  Substance and Sexual Activity   Alcohol use: Yes    Comment: Brandy once in a while to help with sleep   Drug use: Never   Sexual activity: Not on file  Other Topics Concern   Not on file  Social History Narrative   Lives at home with spouse   Right handed   Caffeine: coffee or tea, 2/day       2 deceased sons , 1 son passed from heart attack   Social Determinants of Health   Financial Resource Strain: Not on file  Food Insecurity: Not on file  Transportation Needs: Not on file  Physical Activity: Not on file  Stress: Not on file  Social Connections: Not on file  Intimate Partner Violence: Not on file    Family History:   The patient's family history includes Arrhythmia in her sister; Blindness in her paternal grandfather; Breast cancer in her paternal aunt; Dementia in her mother; Diabetes in her paternal grandmother; Heart Problems in her maternal grandfather and paternal grandmother; Hypertension in her maternal grandmother and  sister; Prostate cancer in her father. There is no history of Neuropathy.    ROS:  Please see the history of present illness.  All other ROS reviewed and negative.     Physical Exam/Data:   Vitals:   12/04/22 0858 12/04/22 0859 12/04/22 0901  BP:  (!) 146/54   Pulse:  70   Resp:  14   Temp:  98.4 F (36.9 C)   TempSrc:  Oral   SpO2: 95% 96%   Weight:   94.2 kg  Height:   5\' 4"  (1.626 m)   No intake or output data in the 24 hours ending 12/04/22 0945    12/04/2022    9:01 AM 12/03/2022    4:47 AM 12/02/2022    9:13 AM  Last 3 Weights  Weight (lbs) 207 lb 10.8 oz 207 lb 10.8 oz 214 lb  Weight (kg) 94.2 kg 94.2 kg 97.07 kg     Body mass index is 35.65 kg/m.  General:  Well nourished, well developed, in no acute distress, HEENT: normal Neck: no JVD Vascular: No carotid bruits; Distal pulses 2+ bilaterally   Cardiac:  RRR; no murmurs, gallops or rubs Lungs:  CTA b/l , no wheezing, rhonchi or rales  Abd: soft, nontender, no hepatomegaly  Ext: no edema Musculoskeletal:  No deformities, BUE and BLE strength normal and equal Skin: warm and dry  Neuro:  CNs 2-12 intact, no focal abnormalities noted Psych:  Normal affect    EKG:  The ECG that was done today was personally reviewed and demonstrates  CHB, RBBB, Vrate 70bpm  Relevant CV Studies:  12/03/22: post TAVR echo 1. The aortic valve has been repaired/replaced. Aortic valve  regurgitation is not visualized. There is a 26 mm Sapien prosthetic (TAVR)  valve present in the aortic position. Procedure Date: 12/02/2022. Echo  findings are consistent with normal structure  and function of the aortic valve prosthesis. Aortic valve area, by VTI  measures 3.17 cm. Aortic valve mean gradient measures 4.0 mmHg. Aortic  valve Vmax measures 1.22 m/s.   2. Left ventricular ejection fraction, by estimation, is 60 to 65%. Left  ventricular ejection fraction by 2D MOD biplane is 61.6 %. The left  ventricle has normal function. The  left ventricle has no regional wall  motion abnormalities. There is moderate  asymmetric left ventricular hypertrophy of the basal-septal segment.  Indeterminate diastolic filling due to E-A fusion.   3. Right ventricular systolic function is normal. The right ventricular  size is normal. Tricuspid regurgitation signal is inadequate for assessing  PA pressure.   4. The mitral valve is grossly normal. Trivial mitral valve  regurgitation. No evidence of mitral stenosis.   5. The inferior vena cava is dilated in size with >50% respiratory  variability, suggesting right atrial pressure of 8 mmHg.   Comparison(s): No significant change from prior study.   Laboratory Data:  High Sensitivity Troponin:  No results for input(s): "TROPONINIHS" in the last 720 hours.    Chemistry Recent Labs  Lab 12/02/22 1845 12/03/22 0642  NA 139 138  K 3.5 3.9  CL 98 100  CO2 27 25  GLUCOSE 134* 173*  BUN 13 11  CREATININE 0.74 0.79  CALCIUM 8.8* 8.7*  MG  --  1.5*  GFRNONAA >60 >60  ANIONGAP 14 13    Recent Labs  Lab 12/01/22 0900  PROT 7.1  ALBUMIN 3.9  AST 49*  ALT 31  ALKPHOS 56  BILITOT 0.6   Lipids No results for input(s): "CHOL", "TRIG", "HDL", "LABVLDL", "LDLCALC", "CHOLHDL" in the last 168 hours. Hematology Recent Labs  Lab 12/01/22 0900 12/02/22 1319 12/03/22 0642  WBC 6.0  --  8.8  RBC 5.03  --  4.87  HGB 13.9 12.2 13.9  HCT 42.1 36.0 40.8  MCV 83.7  --  83.8  MCH 27.6  --  28.5  MCHC 33.0  --  34.1  RDW 14.8  --  14.7  PLT 175  --  153   Thyroid No results for input(s): "TSH", "FREET4" in the last 168 hours. BNPNo results for input(s): "BNP", "PROBNP" in the last 168 hours.  DDimer No results for input(s): "DDIMER" in the last 168 hours.   Radiology/Studies:     Assessment and Plan:   CHB Post TAVR RBBB at baseline  Ongoing episode of CHB w/v standstill Enroute to cath lab for temp pacing wire Dr. Tollie Pizza has been to bedside, discussed procedure,  risks, she is in agreement   Risk Assessment/Risk Scores:    For questions or updates, please contact [facility] Please consult [email or web address] for contact info under     Signed, Sheilah Pigeon, PA-C  12/04/2022 9:45 AM    ATTENDING ATTESTATION:  After conducting a review of all available clinical information with the care team, interviewing the patient, and performing a physical exam, I agree with the findings and plan described in this note.   GEN: No acute distress.   HEENT:  MMM, no JVD, no scleral icterus Cardiac: RRR, no murmurs, rubs, or gallops.  Respiratory: Clear to auscultation bilaterally. GI: Soft, nontender, non-distended  MS: No edema; No deformity. Neuro:  Nonfocal  Vasc:  +2 radial pulses  Patient underwent TAVR 2 days ago with brief asystole (<5 sec) immediately after deployment.  She does have a bifascicular block at baseline.  At the conclusion of the case the patient's conduction had returned to baseline.  However due to her underlying conduction disease she was sent home with ZIO monitor.  This demonstrated intermittent high-grade block and pauses.  The patient was instructed to come to the emergency department and continues to demonstrate intermittent complete heart block and pauses.  Will refer the patient for a temporary pacemaker and likely permanent pacemaker  this admission.  Alverda Skeans, MD Pager [phone]

## 2022-12-04 NOTE — Telephone Encounter (Signed)
   Received a call from [facility] about a critical EKG. Called and spoke with rep. Around 5:50am, patient had a 4.3 second pause and a 10.8 second pause due to possible high grade AV block and then 13 seconds of CHB with rates in the 20s to 40s. She recently underwent a TAVR on 12/02/2022. Called and spoke with husband (okay per DPR). He states patient is sleeping. Advised him to bring patient back to the ED for likely pacemaker. He was very understanding and agreed to the plan. Called and let the ED know that she is coming. Will also update Structural Heart Team.  Corrin Parker, PA-C 12/04/2022 7:25 AM

## 2022-12-04 NOTE — Telephone Encounter (Signed)
Spoke w Meredeth Ide. Ventricular asystole due to possible high grade AV block - occurred 10 am for 5.6 seconds, base rhythm is sinus 87 bmp 10:05 am 5.8 seconds, followed by high grade AV block, 21-28 bpm  Pt was brought to the hospital this morning and has had pacemaker implant.   IR[employer] will fax the rhythm strips for chart to [phone].

## 2022-12-04 NOTE — Care Management (Signed)
Transition of Care [facility]) - Inpatient Brief Assessment   Patient Details  Name: [name] MRN: [id number] Date of Birth: 1945-09-18  Transition of [facility] CM/SW Contact:    Lockie Pares, RN Phone Number: 12/04/2022, 2:43 PM   Clinical Narrative: Patient recently had a TAVR and was on a monitor when she had bradycardia and pause. She had symptoms and was called to go to the ED.  She will likely obtain a pacemaker this admission.  If a need is identified please place a TOC consult.   Transition of Care Asessment: Insurance and Status: Insurance coverage has been reviewed Patient has primary care physician: Yes Home environment has been reviewed: y Prior level of function:: Independent Prior/Current Home Services: No current home services Social Determinants of Health Reivew: SDOH reviewed no interventions necessary Readmission risk has been reviewed: Yes Transition of care needs: no transition of care needs at this time

## 2022-12-04 NOTE — Interval H&P Note (Signed)
History and Physical Interval Note:  12/04/2022 10:35 AM  [name]  has presented today for surgery, with the diagnosis of heart block.  The various methods of treatment have been discussed with the patient and family. After consideration of risks, benefits and other options for treatment, the patient has consented to  Procedure(s): TEMPORARY PACEMAKER (N/A) as a surgical intervention.  The patient's history has been reviewed, patient examined, no change in status, stable for surgery.  I have reviewed the patient's chart and labs.  Questions were answered to the patient's satisfaction.     [name]

## 2022-12-04 NOTE — Telephone Encounter (Signed)
Attempted to contact patient to follow up concerning iRhythm notification, unable to leave message. Patient already instructed to go to ED per Marjie Skiff, PA this morning. DOD (Dr. Excell Seltzer) made aware of critical results and that patient has already been instructed to proceed to ED.      Corrin Parker, PA-C     12/04/22  7:26 AM Note    Received a call from [facility] about a critical EKG. Called and spoke with rep. Around 5:50am, patient had a 4.3 second pause and a 10.8 second pause due to possible high grade AV block and then 13 seconds of CHB with rates in the 20s to 40s. She recently underwent a TAVR on 12/02/2022. Called and spoke with husband (okay per DPR). He states patient is sleeping. Advised him to bring patient back to the ED for likely pacemaker. He was very understanding and agreed to the plan. Called and let the ED know that she is coming. Will also update Structural Heart Team.   Corrin Parker, PA-C 12/04/2022 7:25 AM         Cardiac Monitor Alert  Date of alert:  12/04/2022   Patient Name: [name]  DOB: 09/11/1945  MRN: [id number]   [facility] Cardiologist: Tessa Lerner, DO  [facility] EP:  None    Monitor Information: Long Term Monitor-Live Telemetry [ZioAT]  Reason:  Multiple Pauses with possible high grade AV block Ordering provider:  Cline Crock, PA { Alert Pause(s) - Longest:  10.8 seconds This is the 1st alert for this rhythm.   Next Cardiology Appointment   Date:  12/10/22  Provider:  Structural Heart APP  The patient was contacted today by iRhythm (when I attempted to call, no answer).  She is asymptomatic. per Rockne Coons.  Arrhythmia, symptoms and history reviewed with Dr Excell Seltzer, patient was already instructed to proceed to ED at this point.   Plan:  Patient proceeding to ED     Festus Holts, RN  12/04/2022 8:39 AM

## 2022-12-04 NOTE — Consult Note (Addendum)
Cardiology Admission History and Physical    Patient ID: [name] MRN: [id number]; DOB: [birth year]    Admission date: 12/04/2022   PCP:  Joycelyn Rua, MD              [facility] Providers Cardiologist:  Tessa Lerner, DO        Chief Complaint:  symptomatic bradycardia   Patient Profile:    [name] is a 77 y.o. female with breast cancer, former smoker, DM, HTN,  who is being seen 12/04/2022 for the evaluation of post TAVR pausing/bradycardia.   History of Present Illness:    [name] was of late found to have severe AS via her attending cardiologist Dr. Odis Hollingshead, referred to the structural team with progressive symptoms W/u noted Echo in May 2024 with LVEF=60-65%. The AV was not well seen.  TEE July 2024 with LVEF=55-60%. Normal RV size and function. Mild to moderate mitral regurgitation. Moderately severe aortic stenosis with mean gradient 31 mmHg, peak gradient 51 mmHg, AVA 0.80 cm2, DI 0.28.  Cardiac cath 10/14/22 with no significant CAD. Mean PA pressure 31 mmHg, PCWP 17 mmHg. Aortic valve mean gradient 25 mmHg, AVA 0.94 cm2.    She underwent TAVR 12/02/22, during valve deployment she had a brief asystolic event with spontaneous return of conduction with need to pace/CPR. Discharged yesterday with [facility] AT On NO NODAL blocking agents   Early this morning she had an episode of CHB associated with V pause of 9.4 seconds and advised to proceed to the ER. She arrives in CHB, V rates 70's with intermittent CHB/V standstill      Chem pending WBC 8.7H/H 14/44 Plts 144   Pt denies any CP, SOB, she has been having waves of severe lightheadedness all morning, no syncope         Past Medical History:  Diagnosis Date   Allergic rhinitis     Asthma      Childhood per patient   Breast cancer, left (HCC) 2007    ductal carcinoma in situ, underwent left mastectomy 07/2005; reconstructive surgery with expander, permanent implant placed 03/2006, hospitalized  January 2008 with breast cellulitis.   Breast cancer, right (HCC) 1998    1998; modified radical right mastectomy 1998 for infiltrating ductal adenocarcinoma with 7 of 12 axillary nodes positive. Underwent adjuvant chemotherapy and bone marrow transplant by Dr Greggory Stallion as well as radiation therapy. Also had Tram flap reconstruction.   Diabetes mellitus without complication (HCC)     GERD (gastroesophageal reflux disease)     Hiatal hernia     History of ventricular tachycardia     Hypertension     Vitamin D deficiency                 Past Surgical History:  Procedure Laterality Date   biopsy on scalp   2010   breast implant Left 2007   CESAREAN SECTION       childbirth        63, 66, 67, 70   EYE SURGERY   1952   hysterectomy with one ovary/due to fibroids   1987   INTRAOPERATIVE TRANSTHORACIC ECHOCARDIOGRAM N/A 12/02/2022    Procedure: INTRAOPERATIVE TRANSTHORACIC ECHOCARDIOGRAM;  Surgeon: [name], MD;  Location: [facility];  Service: Open Heart Surgery;  Laterality: N/A;   left mastectomy Left 2007   NASAL SEPTUM SURGERY   1975   PORTACATH PLACEMENT   1998   right mastectomy Right 1998  RIGHT/LEFT HEART CATH AND CORONARY ANGIOGRAPHY N/A 10/14/2022    Procedure: RIGHT/LEFT HEART CATH AND CORONARY ANGIOGRAPHY;  Surgeon: Elder Negus, MD;  Location: [facility];  Service: Cardiovascular;  Laterality: N/A;   stem cell infusion   1998   surgery for crossed eyes       TEE WITHOUT CARDIOVERSION N/A 10/14/2022    Procedure: TRANSESOPHAGEAL ECHOCARDIOGRAM;  Surgeon: Elder Negus, MD;  Location: [facility];  Service: Cardiovascular;  Laterality: N/A;   TONSILLECTOMY   1961   tran flap R breast Right 1998   TRANSCATHETER AORTIC VALVE REPLACEMENT, TRANSFEMORAL N/A 12/02/2022    Procedure: Transcatheter Aortic Valve Replacement, Transfemoral;  Surgeon: [name], MD;  Location: [facility];  Service: Open Heart Surgery;  Laterality: N/A;    tri-port   1998   TUBAL LIGATION              Medications Prior to Admission:        Prior to Admission medications   Medication Sig Start Date End Date Taking? Authorizing Provider  albuterol (VENTOLIN HFA) 108 (90 Base) MCG/ACT inhaler Inhale 2 puffs into the lungs every 4 (four) hours as needed for wheezing or shortness of breath. 04/13/18   Yes [provider]  Ascorbic Acid (VITAMIN C) 100 MG tablet Take 100 mg by mouth daily.     Yes [provider]  B Complex Vitamins (B COMPLEX PO) Take 1 capsule by mouth daily.     Yes [provider]  Cholecalciferol (VITAMIN D3) 125 MCG (5000 UT) TABS Take 5,000 Units by mouth daily.     Yes [provider]  lisinopril-hydrochlorothiazide (ZESTORETIC) 20-12.5 MG tablet Take 1 tablet by mouth daily.     Yes [provider]  Multiple Vitamin (MULTIVITAMINS PO) Take 1 tablet by mouth daily.     Yes [provider]  Multiple Vitamins-Minerals (ZINC PO) Take 10 mg by mouth daily.     Yes [provider]  naproxen sodium (ALEVE) 220 MG tablet Take 440 mg by mouth daily.     Yes [provider]  OZEMPIC, 0.25 OR 0.5 MG/DOSE, 2 MG/3ML SOPN Inject 0.25 mg into the skin every Saturday.     Yes [provider]  pantoprazole (PROTONIX) 40 MG tablet Take 40 mg by mouth daily.     Yes [provider]  polyvinyl alcohol (LIQUIFILM TEARS) 1.4 % ophthalmic solution Place 1 drop into both eyes as needed for dry eyes.     Yes [provider]  potassium chloride SA (KLOR-CON M) 10 MEQ tablet Take 2 tablets (20 mEq total) by mouth 2 (two) times daily. 12/03/22   Yes Janetta Hora, PA-C  solifenacin (VESICARE) 5 MG tablet Take 5 mg by mouth daily. 11/06/22   Yes [provider]  Specialty Vitamins Products (COLLAGEN ULTRA PO) Take 1 capsule by mouth daily as needed (Unknown).     Yes [provider]  torsemide (DEMADEX) 10 MG tablet Take 1 tablet  (10 mg total) by mouth every morning. 11/18/22   Yes Tolia, Sunit, DO      Allergies:    Allergies       Allergies  Allergen Reactions   Penicillin G Anaphylaxis      Other Reaction(s): dry mouth   Other Reaction(s): Rash/SOB   Penicillins Rash and Shortness Of Breath      Other Reaction(s): rash/sob   Bee Venom Swelling  Bee stings   Morphine Other (See Comments) and Nausea And Vomiting        Social History:   Social History         Socioeconomic History   Marital status: Married      Spouse name: Not on file   Number of children: 4   Years of education: Not on file   Highest education level: Some college, no degree  Occupational History   Occupation: Semi-retired working for [employer]  Tobacco Use   Smoking status: Former      Current packs/day: 0.00      Average packs/day: 1 pack/day for 36.0 years (36.0 ttl pk-yrs)      Types: Cigarettes      Start date: 55      Quit date: 1998      Years since quitting: 26.6   Smokeless tobacco: Never   Tobacco comments:      At first maybe 1 pack per week then it moved up to 1 ppd later.  Vaping Use   Vaping status: Never Used  Substance and Sexual Activity   Alcohol use: Yes      Comment: Brandy once in a while to help with sleep   Drug use: Never   Sexual activity: Not on file  Other Topics Concern   Not on file  Social History Narrative    Lives at home with spouse    Right handed    Caffeine: coffee or tea, 2/day          2 deceased sons , 1 son passed from heart attack    Social Determinants of Health    Financial Resource Strain: Not on file  Food Insecurity: Not on file  Transportation Needs: Not on file  Physical Activity: Not on file  Stress: Not on file  Social Connections: Not on file  Intimate Partner Violence: Not on file    Family History:   The patient's family history includes Arrhythmia in her sister; Blindness in her paternal grandfather; Breast cancer in her paternal aunt;  Dementia in her mother; Diabetes in her paternal grandmother; Heart Problems in her maternal grandfather and paternal grandmother; Hypertension in her maternal grandmother and sister; Prostate cancer in her father. There is no history of Neuropathy.     ROS:  Please see the history of present illness.  All other ROS reviewed and negative.      Physical Exam/Data:         Vitals:    12/04/22 0858 12/04/22 0859 12/04/22 0901  BP:   (!) 146/54    Pulse:   70    Resp:   14    Temp:   98.4 F (36.9 C)    TempSrc:   Oral    SpO2: 95% 96%    Weight:     94.2 kg  Height:     5\' 4"  (1.626 m)    No intake or output data in the 24 hours ending 12/04/22 0945     12/04/2022    9:01 AM 12/03/2022    4:47 AM 12/02/2022    9:13 AM  Last 3 Weights  Weight (lbs) 207 lb 10.8 oz 207 lb 10.8 oz 214 lb  Weight (kg) 94.2 kg 94.2 kg 97.07 kg     Body mass index is 35.65 kg/m.  General:  Well nourished, well developed, in no acute distress, HEENT: normal Neck: no JVD Vascular: No carotid bruits; Distal pulses 2+ bilaterally   Cardiac:  RRR; no murmurs, gallops or rubs Lungs:  CTA b/l , no wheezing, rhonchi or rales  Abd: soft, nontender, no hepatomegaly  Ext: no edema Musculoskeletal:  No deformities, BUE and BLE strength normal and equal Skin: warm and dry  Neuro:  CNs 2-12 intact, no focal abnormalities noted Psych:  Normal affect      EKG:  The ECG that was done today was personally reviewed and demonstrates  CHB, RBBB, Vrate 70bpm   Relevant CV Studies:   12/03/22: post TAVR echo 1. The aortic valve has been repaired/replaced. Aortic valve  regurgitation is not visualized. There is a 26 mm Sapien prosthetic (TAVR)  valve present in the aortic position. Procedure Date: 12/02/2022. Echo  findings are consistent with normal structure  and function of the aortic valve prosthesis. Aortic valve area, by VTI  measures 3.17 cm. Aortic valve mean gradient measures 4.0 mmHg. Aortic  valve  Vmax measures 1.22 m/s.   2. Left ventricular ejection fraction, by estimation, is 60 to 65%. Left  ventricular ejection fraction by 2D MOD biplane is 61.6 %. The left  ventricle has normal function. The left ventricle has no regional wall  motion abnormalities. There is moderate  asymmetric left ventricular hypertrophy of the basal-septal segment.  Indeterminate diastolic filling due to E-A fusion.   3. Right ventricular systolic function is normal. The right ventricular  size is normal. Tricuspid regurgitation signal is inadequate for assessing  PA pressure.   4. The mitral valve is grossly normal. Trivial mitral valve  regurgitation. No evidence of mitral stenosis.   5. The inferior vena cava is dilated in size with >50% respiratory  variability, suggesting right atrial pressure of 8 mmHg.   Comparison(s): No significant change from prior study.    Laboratory Data:   High Sensitivity Troponin:   Last Labs  No results for input(s): "TROPONINIHS" in the last 720 hours.      Chemistry Last Labs      Recent Labs  Lab 12/02/22 1845 12/03/22 0642  NA 139 138  K 3.5 3.9  CL 98 100  CO2 27 25  GLUCOSE 134* 173*  BUN 13 11  CREATININE 0.74 0.79  CALCIUM 8.8* 8.7*  MG  --  1.5*  GFRNONAA >60 >60  ANIONGAP 14 13      Last Labs     Recent Labs  Lab 12/01/22 0900  PROT 7.1  ALBUMIN 3.9  AST 49*  ALT 31  ALKPHOS 56  BILITOT 0.6      Lipids  Last Labs  No results for input(s): "CHOL", "TRIG", "HDL", "LABVLDL", "LDLCALC", "CHOLHDL" in the last 168 hours.   Hematology Last Labs       Recent Labs  Lab 12/01/22 0900 12/02/22 1319 12/03/22 0642  WBC 6.0  --  8.8  RBC 5.03  --  4.87  HGB 13.9 12.2 13.9  HCT 42.1 36.0 40.8  MCV 83.7  --  83.8  MCH 27.6  --  28.5  MCHC 33.0  --  34.1  RDW 14.8  --  14.7  PLT 175  --  153      Thyroid  Last Labs  No results for input(s): "TSH", "FREET4" in the last 168 hours.   BNP Last Labs  No results for input(s):  "BNP", "PROBNP" in the last 168 hours.    DDimer  Last Labs  No results for input(s): "DDIMER" in the last 168 hours.       Radiology/Studies:  Assessment and Plan:    CHB Post TAVR RBBB at baseline   Ongoing episode of CHB w/v standstill Enroute to cath lab for temp pacing wire Dr. Tollie Pizza has been to bedside, discussed procedure, risks, she is in agreement  She will need PPM Preserved LVEF RBBB Dual chamber device planned  3.  VT  This is a very unclear hx Reports palpitations since her teens and in her 60's reportedly diagnosed with VT She tells me that she had an electrical procedure at [facility] and told she had an electrical problem, "VT" No reports of an ablation, never advised to have any device implants Did given her a medication, can not remember the name I can not find any documented hx otherwise what is listed currently No specific known     Risk Assessment/Risk Scores:      For questions or updates, please contact [facility] Please consult [email or web address] for contact info under      Signed, Sheilah Pigeon, PA-C  12/04/2022 9:45 AM

## 2022-12-04 NOTE — ED Notes (Signed)
ED TO INPATIENT HANDOFF REPORT  ED Nurse Name and Phone #: Jayce Boyko [phone]  S Name/Age/Gender [name] 77 y.o. female Room/Bed: 021C/021C  Code Status   Code Status: Prior  Home/SNF/Other Home Patient oriented to: self, place, time, and situation Is this baseline? Yes   Triage Complete: Triage complete  Chief Complaint NEED A PACEMAKER  Triage Note Patient arrived POV after her husband spoke with cardiology office, stating she needed to come to ER for pacemaker placement due to bradycardia. Patient had Trans Aortic Valve Replacement done on Tuesday 12/02/2022. Patient states she feels "woozy headed, wobbling, and queasy."    Allergies Allergies  Allergen Reactions   Penicillin G Anaphylaxis    Other Reaction(s): dry mouth  Other Reaction(s): Rash/SOB   Penicillins Rash and Shortness Of Breath    Other Reaction(s): rash/sob   Bee Venom Swelling    Bee stings   Morphine Other (See Comments) and Nausea And Vomiting    Level of Care/Admitting Diagnosis ED Disposition     ED Disposition  Admit   Condition  --   Comment  The patient appears reasonably stabilized for admission considering the current resources, flow, and capabilities available in the ED at this time, and I doubt any other [facility] requiring further screening and/or treatment in the ED prior to admission is  present.          B Medical/Surgery History Past Medical History:  Diagnosis Date   Allergic rhinitis    Asthma    Childhood per patient   Breast cancer, left (HCC) 2007   ductal carcinoma in situ, underwent left mastectomy 07/2005; reconstructive surgery with expander, permanent implant placed 03/2006, hospitalized January 2008 with breast cellulitis.   Breast cancer, right (HCC) 1998   1998; modified radical right mastectomy 1998 for infiltrating ductal adenocarcinoma with 7 of 12 axillary nodes positive. Underwent adjuvant chemotherapy and bone marrow transplant by Dr Greggory Stallion as well as  radiation therapy. Also had Tram flap reconstruction.   Diabetes mellitus without complication (HCC)    GERD (gastroesophageal reflux disease)    Hiatal hernia    History of ventricular tachycardia    Hypertension    Vitamin D deficiency    Past Surgical History:  Procedure Laterality Date   biopsy on scalp  2010   breast implant Left 2007   CESAREAN SECTION     childbirth     63, 66, 67, 70   EYE SURGERY  1952   hysterectomy with one ovary/due to fibroids  1987   INTRAOPERATIVE TRANSTHORACIC ECHOCARDIOGRAM N/A 12/02/2022   Procedure: INTRAOPERATIVE TRANSTHORACIC ECHOCARDIOGRAM;  Surgeon: [name], MD;  Location: [facility] INVASIVE CV LAB;  Service: Open Heart Surgery;  Laterality: N/A;   left mastectomy Left 2007   NASAL SEPTUM SURGERY  1975   PORTACATH PLACEMENT  1998   right mastectomy Right 1998   RIGHT/LEFT HEART CATH AND CORONARY ANGIOGRAPHY N/A 10/14/2022   Procedure: RIGHT/LEFT HEART CATH AND CORONARY ANGIOGRAPHY;  Surgeon: Elder Negus, MD;  Location: [facility] INVASIVE CV LAB;  Service: Cardiovascular;  Laterality: N/A;   stem cell infusion  1998   surgery for crossed eyes     TEE WITHOUT CARDIOVERSION N/A 10/14/2022   Procedure: TRANSESOPHAGEAL ECHOCARDIOGRAM;  Surgeon: Elder Negus, MD;  Location: [facility] INVASIVE CV LAB;  Service: Cardiovascular;  Laterality: N/A;   TONSILLECTOMY  1961   tran flap R breast Right 1998   TRANSCATHETER AORTIC VALVE REPLACEMENT, TRANSFEMORAL N/A 12/02/2022   Procedure: Transcatheter Aortic Valve  Replacement, Transfemoral;  Surgeon: [name], MD;  Location: [facility] INVASIVE CV LAB;  Service: Open Heart Surgery;  Laterality: N/A;   tri-port  1998   TUBAL LIGATION       A IV Location/Drains/Wounds Patient Lines/Drains/Airways Status     Active Line/Drains/Airways     Name Placement date Placement time Site Days   Peripheral IV 12/04/22 20 G Anterior;Left;Proximal Forearm 12/04/22  0904  Forearm  less than 1             Intake/Output Last 24 hours No intake or output data in the 24 hours ending 12/04/22 4098  Labs/Imaging Results for orders placed or performed during the hospital encounter of 12/02/22 (from the past 48 hour(s))  ABO/Rh     Status: None   Collection Time: 12/02/22 10:00 AM  Result Value Ref Range   ABO/RH(D)      A NEG Performed at [facility], [address], [place], [place] [address]   I-STAT, [place] 8     Status: Abnormal   Collection Time: 12/02/22  1:19 PM  Result Value Ref Range   Sodium 138 135 - 145 mmol/L   Potassium 3.3 (L) 3.5 - 5.1 mmol/L   Chloride 100 98 - 111 mmol/L   BUN 16 8 - 23 mg/dL   Creatinine, Ser 7.82 0.44 - 1.00 mg/dL   Glucose, Bld 956 (H) 70 - 99 mg/dL    Comment: Glucose reference range applies only to samples taken after fasting for at least 8 hours.   Calcium, Ion 1.18 1.15 - 1.40 mmol/L   TCO2 31 22 - 32 mmol/L   Hemoglobin 12.2 12.0 - 15.0 g/dL   HCT 21.3 08.6 - 57.8 %  Glucose, capillary     Status: Abnormal   Collection Time: 12/02/22  4:06 PM  Result Value Ref Range   Glucose-Capillary 127 (H) 70 - 99 mg/dL    Comment: Glucose reference range applies only to samples taken after fasting for at least 8 hours.  Basic metabolic panel     Status: Abnormal   Collection Time: 12/02/22  6:45 PM  Result Value Ref Range   Sodium 139 135 - 145 mmol/L   Potassium 3.5 3.5 - 5.1 mmol/L   Chloride 98 98 - 111 mmol/L   CO2 27 22 - 32 mmol/L   Glucose, Bld 134 (H) 70 - 99 mg/dL    Comment: Glucose reference range applies only to samples taken after fasting for at least 8 hours.   BUN 13 8 - 23 mg/dL   Creatinine, Ser 4.69 0.44 - 1.00 mg/dL   Calcium 8.8 (L) 8.9 - 10.3 mg/dL   GFR, Estimated >62 >95 mL/min    Comment: (NOTE) Calculated using the CKD-EPI Creatinine Equation (2021)    Anion gap 14 5 - 15    Comment: Performed at [facility], [address], [place], [place] [address]  CBC     Status: None   Collection Time: 12/03/22   6:42 AM  Result Value Ref Range   WBC 8.8 4.0 - 10.5 K/uL   RBC 4.87 3.87 - 5.11 MIL/uL   Hemoglobin 13.9 12.0 - 15.0 g/dL   HCT 24.4 01.0 - 27.2 %   MCV 83.8 80.0 - 100.0 fL   MCH 28.5 26.0 - 34.0 pg   MCHC 34.1 30.0 - 36.0 g/dL   RDW 53.6 64.4 - 03.4 %   Platelets 153 150 - 400 K/uL   nRBC 0.0 0.0 -  0.2 %    Comment: Performed at [facility], [address] [place], [place] [address]  Basic metabolic panel     Status: Abnormal   Collection Time: 12/03/22  6:42 AM  Result Value Ref Range   Sodium 138 135 - 145 mmol/L   Potassium 3.9 3.5 - 5.1 mmol/L   Chloride 100 98 - 111 mmol/L   CO2 25 22 - 32 mmol/L   Glucose, Bld 173 (H) 70 - 99 mg/dL    Comment: Glucose reference range applies only to samples taken after fasting for at least 8 hours.   BUN 11 8 - 23 mg/dL   Creatinine, Ser 2.53 0.44 - 1.00 mg/dL   Calcium 8.7 (L) 8.9 - 10.3 mg/dL   GFR, Estimated >66 >44 mL/min    Comment: (NOTE) Calculated using the CKD-EPI Creatinine Equation (2021)    Anion gap 13 5 - 15    Comment: Performed at [facility], [address], [place], [place] [address]  Magnesium     Status: Abnormal   Collection Time: 12/03/22  6:42 AM  Result Value Ref Range   Magnesium 1.5 (L) 1.7 - 2.4 mg/dL    Comment: Performed at [facility], [address], [place], [place] [address]   ECHOCARDIOGRAM COMPLETE  Result Date: 12/03/2022    ECHOCARDIOGRAM REPORT   Patient Name:   [name] Date of Exam: 12/03/2022 Medical Rec #:  [id number]     Height:       64.0 in Accession #:    [id number]    Weight:       207.7 lb Date of Birth:  06-Feb-1946     BSA:          1.988 m Patient Age:    77 years      BP:           156/89 mmHg Patient Gender: F             HR:           97 bpm. Exam Location:  Inpatient Procedure: 2D Echo, Cardiac Doppler and Color Doppler Indications:    I35.0 Nonrheumatic aortic (valve) stenosis  History:        Patient has prior history of Echocardiogram examinations, most                  recent 12/02/2022. Previous Myocardial Infarction, Abnormal ECG,                 Aortic Valve Disease, Arrythmias:Atrial Fibrillation and VT;                 Risk Factors:Current Smoker and Hypertension. Aortic stenosis.                 Breast cancer.                 Aortic Valve: 26 mm Sapien prosthetic, stented (TAVR) valve is                 present in the aortic position. Procedure Date: 12/02/2022.  Sonographer:    Sheralyn Boatman RDCS Referring Phys: [id number] [facility]  Sonographer Comments: Technically difficult study due to poor echo windows and patient is obese. Image acquisition challenging due to patient body habitus and Image acquisition challenging due to breast implants. No on-axis apical could be obtained due to implant. Patient very sensitive to pressure from probe. IMPRESSIONS  1. The aortic valve  has been repaired/replaced. Aortic valve regurgitation is not visualized. There is a 26 mm Sapien prosthetic (TAVR) valve present in the aortic position. Procedure Date: 12/02/2022. Echo findings are consistent with normal structure and function of the aortic valve prosthesis. Aortic valve area, by VTI measures 3.17 cm. Aortic valve mean gradient measures 4.0 mmHg. Aortic valve Vmax measures 1.22 m/s.  2. Left ventricular ejection fraction, by estimation, is 60 to 65%. Left ventricular ejection fraction by 2D MOD biplane is 61.6 %. The left ventricle has normal function. The left ventricle has no regional wall motion abnormalities. There is moderate asymmetric left ventricular hypertrophy of the basal-septal segment. Indeterminate diastolic filling due to E-A fusion.  3. Right ventricular systolic function is normal. The right ventricular size is normal. Tricuspid regurgitation signal is inadequate for assessing PA pressure.  4. The mitral valve is grossly normal. Trivial mitral valve regurgitation. No evidence of mitral stenosis.  5. The inferior vena cava is dilated in size with >50%  respiratory variability, suggesting right atrial pressure of 8 mmHg. Comparison(s): No significant change from prior study. FINDINGS  Left Ventricle: Left ventricular ejection fraction, by estimation, is 60 to 65%. Left ventricular ejection fraction by 2D MOD biplane is 61.6 %. The left ventricle has normal function. The left ventricle has no regional wall motion abnormalities. The left ventricular internal cavity size was normal in size. There is moderate asymmetric left ventricular hypertrophy of the basal-septal segment. Indeterminate diastolic filling due to E-A fusion. Right Ventricle: The right ventricular size is normal. No increase in right ventricular wall thickness. Right ventricular systolic function is normal. Tricuspid regurgitation signal is inadequate for assessing PA pressure. Left Atrium: Left atrial size was normal in size. Right Atrium: Right atrial size was normal in size. Pericardium: There is no evidence of pericardial effusion. Presence of epicardial fat layer. Mitral Valve: The mitral valve is grossly normal. Trivial mitral valve regurgitation. No evidence of mitral valve stenosis. Tricuspid Valve: The tricuspid valve is grossly normal. Tricuspid valve regurgitation is not demonstrated. No evidence of tricuspid stenosis. Aortic Valve: The aortic valve has been repaired/replaced. Aortic valve regurgitation is not visualized. Aortic valve mean gradient measures 4.0 mmHg. Aortic valve peak gradient measures 6.0 mmHg. Aortic valve area, by VTI measures 3.17 cm. There is a 26 mm Sapien prosthetic, stented (TAVR) valve present in the aortic position. Procedure Date: 12/02/2022. Echo findings are consistent with normal structure and function of the aortic valve prosthesis. Pulmonic Valve: The pulmonic valve was grossly normal. Pulmonic valve regurgitation is not visualized. No evidence of pulmonic stenosis. Aorta: The aortic root and ascending aorta are structurally normal, with no evidence of  dilitation. Venous: The inferior vena cava is dilated in size with greater than 50% respiratory variability, suggesting right atrial pressure of 8 mmHg. IAS/Shunts: The atrial septum is grossly normal.  LEFT VENTRICLE PLAX 2D                        Biplane EF (MOD) LVIDd:         4.20 cm         LV Biplane EF:   Left LVIDs:         2.70 cm                          ventricular LV PW:         1.30 cm  ejection LV IVS:        1.50 cm                          fraction by LVOT diam:     2.30 cm                          2D MOD LV SV:         64                               biplane is LV SV Index:   32                               61.6 %. LVOT Area:     4.15 cm                                Diastology                                LV e' medial:    5.33 cm/s LV Volumes (MOD)               LV E/e' medial:  18.2 LV vol d, MOD    58.3 ml       LV e' lateral:   4.24 cm/s A2C:                           LV E/e' lateral: 22.9 LV vol d, MOD    155.0 ml A4C: LV vol s, MOD    23.0 ml A2C: LV vol s, MOD    59.0 ml A4C: LV SV MOD A2C:   35.3 ml LV SV MOD A4C:   155.0 ml LV SV MOD BP:    77.3 ml IVC IVC diam: 2.10 cm LEFT ATRIUM           Index        RIGHT ATRIUM          Index LA diam:      3.70 cm 1.86 cm/m   RA Area:     6.33 cm LA Vol (A2C): 10.6 ml 5.33 ml/m   RA Volume:   8.98 ml  4.52 ml/m LA Vol (A4C): 20.0 ml 10.06 ml/m  AORTIC VALVE AV Area (Vmax):    3.53 cm AV Area (Vmean):   3.22 cm AV Area (VTI):     3.17 cm AV Vmax:           122.25 cm/s AV Vmean:          83.725 cm/s AV VTI:            0.200 m AV Peak Grad:      6.0 mmHg AV Mean Grad:      4.0 mmHg LVOT Vmax:         104.00 cm/s LVOT Vmean:        64.900 cm/s LVOT VTI:          0.153 m LVOT/AV VTI ratio: 0.76  AORTA Ao Root diam: 3.40 cm Ao Asc diam:  3.20 cm MITRAL VALVE MV Area (PHT): 5.38 cm    SHUNTS  MV Decel Time: 141 msec    Systemic VTI:  0.15 m MV E velocity: 96.90 cm/s  Systemic Diam: 2.30 cm Lennie Odor MD Electronically  signed by Lennie Odor MD Signature Date/Time: 12/03/2022/11:34:32 AM    Final    ECHOCARDIOGRAM LIMITED  Result Date: 12/02/2022    ECHOCARDIOGRAM LIMITED REPORT   Patient Name:   [name] Date of Exam: 12/02/2022 Medical Rec #:  [id number]     Height:       64.0 in Accession #:    [id number]    Weight:       214.0 lb Date of Birth:  1946/03/05     BSA:          2.013 m Patient Age:    77 years      BP:           152/101 mmHg Patient Gender: F             HR:           65 bpm. Exam Location:  Inpatient Procedure: Limited Echo, Limited Color Doppler and Color Doppler Indications:    TAVR. Aortic stenosis  History:        Patient has prior history of Echocardiogram examinations, most                 recent 10/14/2022. History of breast cancer, Arrythmias:RBBB; Risk                 Factors:Hypertension, Former Smoker and Diabetes.                 Aortic Valve: 26 mm Sapien prosthetic, stented (TAVR) valve is                 present in the aortic position. Procedure Date: 12/02/22.  Sonographer:    Delcie Roch RDCS Referring Phys: [id number] [name]  Sonographer Comments: Image acquisition challenging due to breast implants. IMPRESSIONS  1. Left ventricular ejection fraction, by estimation, is 60 to 65%. The left ventricle has normal function. There is moderate left ventricular hypertrophy.  2. The mitral valve is abnormal. Mild mitral valve regurgitation.  3. Pre TAVR: severe AS Tri leaflet AV calcified mean No apical windows supine in cath lab Gradient suboptimal from subcostal and right sternal border Breast implants hinder imaging as well Mean gradient 18 peak 27 mmHg AVA 0.77 cm2         Post TAVR: well positioned 26 mm Sapien valve noted markedly prolonged PR interval on ECG tracing. No PVL Only able to get Pedof RSB CW gradients mean 4 mmHg peak 7 mmHg . The aortic valve has been repaired/replaced. There is a 26 mm Sapien prosthetic (TAVR) valve present in the aortic position. Procedure Date:  12/02/22. FINDINGS  Left Ventricle: Left ventricular ejection fraction, by estimation, is 60 to 65%. The left ventricle has normal function. The left ventricular internal cavity size was normal in size. There is moderate left ventricular hypertrophy. Pericardium: There is no evidence of pericardial effusion. Mitral Valve: The mitral valve is abnormal. There is mild thickening of the mitral valve leaflet(s). There is mild calcification of the mitral valve leaflet(s). Mild mitral valve regurgitation. Tricuspid Valve: Tricuspid valve regurgitation is mild. Aortic Valve: Pre TAVR: severe AS Tri leaflet AV calcified mean No apical windows supine in cath lab Gradient suboptimal from subcostal and right sternal border Breast implants hinder imaging as well Mean gradient 18 peak 27 mmHg AVA 0.77 cm2 Post TAVR:  well positioned 26 mm Sapien valve noted markedly prolonged PR interval on ECG tracing. No PVL Only able to get Pedof RSB CW gradients mean 4 mmHg peak 7 mmHg. The aortic valve has been repaired/replaced. Aortic valve mean gradient measures 11.0 mmHg. Aortic valve peak gradient measures 15.1 mmHg. Aortic valve area, by VTI measures 0.52 cm. There is a 26 mm Sapien prosthetic, stented (TAVR) valve present in the aortic position. Procedure Date: 12/02/22. Pulmonic Valve: Pulmonic valve regurgitation is trivial. Aorta: The aortic root is normal in size and structure. IAS/Shunts: No atrial level shunt detected by color flow Doppler. Additional Comments: Spectral Doppler performed. Color Doppler performed.  LEFT VENTRICLE PLAX 2D LVOT diam:     1.80 cm LV SV:         27 LV SV Index:   13 LVOT Area:     2.54 cm  AORTIC VALVE AV Area (Vmax):    0.61 cm AV Area (Vmean):   0.49 cm AV Area (VTI):     0.52 cm AV Vmax:           194.50 cm/s AV Vmean:          148.200 cm/s AV VTI:            0.513 m AV Peak Grad:      15.1 mmHg AV Mean Grad:      11.0 mmHg LVOT Vmax:         46.30 cm/s LVOT Vmean:        28.600 cm/s LVOT VTI:           0.105 m LVOT/AV VTI ratio: 0.20  AORTA Ao Root diam: 2.90 cm Ao Asc diam:  2.90 cm  SHUNTS Systemic VTI:  0.10 m Systemic Diam: 1.80 cm Charlton Haws MD Electronically signed by Charlton Haws MD Signature Date/Time: 12/02/2022/1:50:07 PM    Final    Structural Heart Procedure  Result Date: 12/02/2022 See surgical note for result.   Pending Labs Unresulted Labs (From admission, onward)     Start     Ordered   12/04/22 0929  CBC with Differential  Once,   STAT        12/04/22 0928   12/04/22 0929  Comprehensive metabolic panel  Once,   STAT        12/04/22 0928   12/04/22 0929  Magnesium  Once,   STAT        12/04/22 0928   12/04/22 0929  TSH  Once,   URGENT        12/04/22 0928            Vitals/Pain Today's Vitals   12/04/22 0858 12/04/22 0859 12/04/22 0901  BP:  (!) 146/54   Pulse:  70   Resp:  14   Temp:  98.4 F (36.9 C)   TempSrc:  Oral   SpO2: 95% 96%   Weight:   94.2 kg  Height:   5\' 4"  (1.626 m)  PainSc:   0-No pain    Isolation Precautions No active isolations  Medications Medications - No data to display  Mobility walks with device     Focused Assessments Cardiac Assessment Handoff:    No results found for: "CKTOTAL", "CKMB", "CKMBINDEX", "TROPONINI" No results found for: "DDIMER" Does the Patient currently have chest pain? No    R Recommendations: See Admitting Provider Note  Report given to:   Additional Notes:

## 2022-12-04 NOTE — Telephone Encounter (Signed)
Received critical alert tracing.  Placed in box to be scanned into pt chart.

## 2022-12-04 NOTE — Telephone Encounter (Signed)
Deanna with [employer] is calling to report updated critical results.  Phone #: [phone] (ref#: [id number])

## 2022-12-04 NOTE — ED Triage Notes (Addendum)
Patient arrived POV after her husband spoke with cardiology office, stating she needed to come to ER for pacemaker placement due to bradycardia. Patient had Trans Aortic Valve Replacement done on Tuesday 12/02/2022. Patient states she feels "woozy headed, wobbling, and queasy."

## 2022-12-04 NOTE — ED Provider Notes (Signed)
[place] EMERGENCY DEPARTMENT AT [facility] Provider Note  MDM   HPI/ROS:  [name] is a 77 y.o. female with medical history of breast cancer, morbid obesity, former tobacco abuse, ventricular tachycardia, DMT2, HTN, RBBB/LAFB and severe LFLG aortic stenosis who is postop day 2 from TAVR presenting from cardiology office for evaluation for pacemaker placement.  Patient was discharged on a Zio patch and was called by her cardiologist after being notified of high degree heart block with multiple pauses discovered on Zio patch.  She was sent here for pacemaker placement.  Physical exam is notable for: - Overall well-appearing, no acute distress - Dressing over right neck access site clean, dry, intact - Blood pressure stable  EKG obtained, demonstrating high-grade AV block.  Basic labs ordered.  Cardiology consultation placed.  After speaking with cardiology, patient to be admitted for evaluation for pacemaker placement.  Please see inpatient provider notes for further details.  Disposition:  I discussed the case with cardiology who graciously agreed to admit the patient to their service for continued care.   Clinical Impression:  1. Complete heart block (HCC)     Rx / DC Orders ED Discharge Orders     None       The plan for this patient was discussed with Dr. Rhunette Croft, who voiced agreement and who oversaw evaluation and treatment of this patient.   Clinical Complexity A medically appropriate history, review of systems, and physical exam was performed.  My independent interpretations of EKG, labs, and radiology are documented in the ED course above.   Click here for ABCD2, HEART and other calculatorsREFRESH Note before signing   Patient's presentation is most consistent with acute presentation with potential threat to life or bodily function.  Medical Decision Making Risk Decision regarding hospitalization.    HPI/ROS      See MDM section for  pertinent HPI and ROS. A complete ROS was performed with pertinent positives/negatives noted above.   Past Medical History:  Diagnosis Date   Allergic rhinitis    Asthma    Childhood per patient   Breast cancer, left (HCC) 2007   ductal carcinoma in situ, underwent left mastectomy 07/2005; reconstructive surgery with expander, permanent implant placed 03/2006, hospitalized January 2008 with breast cellulitis.   Breast cancer, right (HCC) 1998   1998; modified radical right mastectomy 1998 for infiltrating ductal adenocarcinoma with 7 of 12 axillary nodes positive. Underwent adjuvant chemotherapy and bone marrow transplant by Dr Greggory Stallion as well as radiation therapy. Also had Tram flap reconstruction.   Diabetes mellitus without complication (HCC)    GERD (gastroesophageal reflux disease)    Hiatal hernia    History of ventricular tachycardia    Hypertension    Vitamin D deficiency     Past Surgical History:  Procedure Laterality Date   biopsy on scalp  2010   breast implant Left 2007   CESAREAN SECTION     childbirth     63, 66, 67, 70   EYE SURGERY  1952   hysterectomy with one ovary/due to fibroids  1987   INTRAOPERATIVE TRANSTHORACIC ECHOCARDIOGRAM N/A 12/02/2022   Procedure: INTRAOPERATIVE TRANSTHORACIC ECHOCARDIOGRAM;  Surgeon: [name], MD;  Location: [facility] INVASIVE CV LAB;  Service: Open Heart Surgery;  Laterality: N/A;   left mastectomy Left 2007   NASAL SEPTUM SURGERY  1975   PORTACATH PLACEMENT  1998   right mastectomy Right 1998   RIGHT/LEFT HEART CATH AND CORONARY ANGIOGRAPHY N/A 10/14/2022   Procedure: RIGHT/LEFT  HEART CATH AND CORONARY ANGIOGRAPHY;  Surgeon: Elder Negus, MD;  Location: [facility] INVASIVE CV LAB;  Service: Cardiovascular;  Laterality: N/A;   stem cell infusion  1998   surgery for crossed eyes     TEE WITHOUT CARDIOVERSION N/A 10/14/2022   Procedure: TRANSESOPHAGEAL ECHOCARDIOGRAM;  Surgeon: Elder Negus, MD;  Location: [facility] INVASIVE CV LAB;   Service: Cardiovascular;  Laterality: N/A;   TONSILLECTOMY  1961   tran flap R breast Right 1998   TRANSCATHETER AORTIC VALVE REPLACEMENT, TRANSFEMORAL N/A 12/02/2022   Procedure: Transcatheter Aortic Valve Replacement, Transfemoral;  Surgeon: [name], MD;  Location: [facility] INVASIVE CV LAB;  Service: Open Heart Surgery;  Laterality: N/A;   tri-port  1998   TUBAL LIGATION        Physical Exam   Vitals:   12/04/22 1330 12/04/22 1400 12/04/22 1430 12/04/22 1611  BP: (!) 142/63 (!) 142/65 (!) 143/81   Pulse: 65 69 69   Resp: (!) 22 19 (!) 22   Temp:    98.5 F (36.9 C)  TempSrc:    Oral  SpO2: 93% 94% 93%   Weight:      Height:        Physical Exam Vitals and nursing note reviewed.  Constitutional:      General: She is not in acute distress.    Appearance: She is well-developed.  HENT:     Head: Normocephalic and atraumatic.  Eyes:     Conjunctiva/sclera: Conjunctivae normal.  Cardiovascular:     Rate and Rhythm: Normal rate. Rhythm irregular.     Heart sounds: No murmur heard. Pulmonary:     Effort: Pulmonary effort is normal. No respiratory distress.     Breath sounds: Normal breath sounds.  Abdominal:     Palpations: Abdomen is soft.     Tenderness: There is no abdominal tenderness.  Musculoskeletal:        General: No swelling.     Cervical back: Neck supple.  Neurological:     Mental Status: She is alert.  Psychiatric:        Mood and Affect: Mood normal.     Starleen Arms, MD Department of Emergency Medicine   Please note that this documentation was produced with the assistance of voice-to-text technology and may contain errors.    Dyanne Iha, MD 12/04/22 1621    Derwood Kaplan, MD 12/05/22 [phone]

## 2022-12-04 NOTE — Telephone Encounter (Addendum)
Received page from [employer] in regards to urgent EKG finding of 4 second pause (no ventricular escape) while patient sleeping around 0330. Attempted to call patient, no answer. Likely vagal mediate during sleep but given higher risk plan to alert ordering providers given recent TAVR and her underlying conduction disease on EKG.

## 2022-12-04 NOTE — Telephone Encounter (Signed)
[name] with [employer] calling to report critical monitor results

## 2022-12-04 NOTE — Telephone Encounter (Signed)
Urgent EKG results. Call transferred

## 2022-12-05 ENCOUNTER — Inpatient Hospital Stay (HOSPITAL_COMMUNITY): Payer: Medicare HMO

## 2022-12-05 ENCOUNTER — Encounter (HOSPITAL_COMMUNITY): Payer: Self-pay | Admitting: Internal Medicine

## 2022-12-05 DIAGNOSIS — I700 Atherosclerosis of aorta: Secondary | ICD-10-CM | POA: Diagnosis not present

## 2022-12-05 DIAGNOSIS — E119 Type 2 diabetes mellitus without complications: Secondary | ICD-10-CM | POA: Diagnosis not present

## 2022-12-05 DIAGNOSIS — R9389 Abnormal findings on diagnostic imaging of other specified body structures: Secondary | ICD-10-CM | POA: Diagnosis not present

## 2022-12-05 DIAGNOSIS — Z87891 Personal history of nicotine dependence: Secondary | ICD-10-CM | POA: Diagnosis not present

## 2022-12-05 DIAGNOSIS — R001 Bradycardia, unspecified: Secondary | ICD-10-CM | POA: Diagnosis not present

## 2022-12-05 DIAGNOSIS — Z950 Presence of cardiac pacemaker: Secondary | ICD-10-CM | POA: Diagnosis not present

## 2022-12-05 DIAGNOSIS — I442 Atrioventricular block, complete: Secondary | ICD-10-CM | POA: Diagnosis not present

## 2022-12-05 DIAGNOSIS — I350 Nonrheumatic aortic (valve) stenosis: Secondary | ICD-10-CM | POA: Diagnosis not present

## 2022-12-05 DIAGNOSIS — Z952 Presence of prosthetic heart valve: Secondary | ICD-10-CM | POA: Diagnosis not present

## 2022-12-05 DIAGNOSIS — I10 Essential (primary) hypertension: Secondary | ICD-10-CM | POA: Diagnosis not present

## 2022-12-05 DIAGNOSIS — Z79899 Other long term (current) drug therapy: Secondary | ICD-10-CM | POA: Diagnosis not present

## 2022-12-05 DIAGNOSIS — Z853 Personal history of malignant neoplasm of breast: Secondary | ICD-10-CM | POA: Diagnosis not present

## 2022-12-05 LAB — BASIC METABOLIC PANEL
Anion gap: 11 (ref 5–15)
BUN: 9 mg/dL (ref 8–23)
CO2: 25 mmol/L (ref 22–32)
Calcium: 8.4 mg/dL — ABNORMAL LOW (ref 8.9–10.3)
Chloride: 99 mmol/L (ref 98–111)
Creatinine, Ser: 0.68 mg/dL (ref 0.44–1.00)
GFR, Estimated: >60 mL/min (ref >60)
Glucose, Bld: 151 mg/dL — ABNORMAL HIGH (ref 70–99)
Potassium: 3.6 mmol/L (ref 3.5–5.1)
Sodium: 135 mmol/L (ref 135–145)

## 2022-12-05 MED FILL — Cefazolin Sodium-Dextrose IV Solution 2 GM/100ML-4%: INTRAVENOUS | Qty: 100 | Status: AC

## 2022-12-05 NOTE — Progress Notes (Signed)
PIV removed. Discharge AVS given and explained, all questions answered. Follow up appointments and post-op instructions discussed. Pacemaker box sent home with patient.and husband. Patient escorted in wheelchair to lobby. Sent home with husband.

## 2022-12-05 NOTE — Plan of Care (Signed)
Problem: Education: Goal: Knowledge of cardiac device and self-care will improve 12/05/2022 1003 by Elazar Argabright, [place] L, RN Outcome: Completed/Met 12/05/2022 1003 by Jequan Shahin, [place] L, RN Outcome: Adequate for Discharge 12/05/2022 [phone] by Amorette Charrette, [place] L, RN Outcome: Progressing Goal: Ability to safely manage health related needs after discharge will improve 12/05/2022 1003 by Tykerria Mccubbins, [place] L, RN Outcome: Completed/Met 12/05/2022 1003 by Idamae Coccia, [place] L, RN Outcome: Adequate for Discharge Goal: Individualized Educational Video(s) 12/05/2022 1003 by Alli Jasmer, [place] L, RN Outcome: Completed/Met 12/05/2022 1003 by Abdulahad Mederos, [place] L, RN Outcome: Adequate for Discharge   Problem: Cardiac: Goal: Ability to achieve and maintain adequate cardiopulmonary perfusion will improve 12/05/2022 1003 by Georgio Hattabaugh, [place] L, RN Outcome: Completed/Met 12/05/2022 1003 by Desha Bitner, [place] L, RN Outcome: Adequate for Discharge   Problem: Education: Goal: Knowledge of General Education information will improve Description: Including pain rating scale, medication(s)/side effects and non-pharmacologic comfort measures 12/05/2022 1003 by Yadriel Kerrigan, [place] L, RN Outcome: Completed/Met 12/05/2022 1003 by Nateisha Moyd, [place] L, RN Outcome: Adequate for Discharge 12/05/2022 [phone] by Bhavana Kady, [place] L, RN Outcome: Progressing   Problem: Health Behavior/Discharge Planning: Goal: Ability to manage health-related needs will improve 12/05/2022 1003 by Yakira Duquette, [place] L, RN Outcome: Completed/Met 12/05/2022 1003 by Bear Osten, [place] L, RN Outcome: Adequate for Discharge   Problem: Clinical Measurements: Goal: Ability to maintain clinical measurements within normal limits will improve 12/05/2022 1003 by Amias Hutchinson, [place] L, RN Outcome: Completed/Met 12/05/2022 1003 by Masako Overall, [place] L, RN Outcome: Adequate for Discharge Goal: Will remain free from infection 12/05/2022 1003 by Nyriah Coote, [place] L, RN Outcome:  Completed/Met 12/05/2022 1003 by Tatsuo Musial, [place] L, RN Outcome: Adequate for Discharge Goal: Diagnostic test results will improve 12/05/2022 1003 by Leyan Branden, [place] L, RN Outcome: Completed/Met 12/05/2022 1003 by Verlia Kaney, [place] L, RN Outcome: Adequate for Discharge Goal: Respiratory complications will improve 12/05/2022 1003 by Jahmire Ruffins, [place] L, RN Outcome: Completed/Met 12/05/2022 1003 by Ileah Falkenstein, [place] L, RN Outcome: Adequate for Discharge Goal: Cardiovascular complication will be avoided 12/05/2022 1003 by Elysa Womac, [place] L, RN Outcome: Completed/Met 12/05/2022 1003 by Keno Caraway, [place] L, RN Outcome: Adequate for Discharge   Problem: Activity: Goal: Risk for activity intolerance will decrease 12/05/2022 1003 by Lamiyah Schlotter, [place] L, RN Outcome: Completed/Met 12/05/2022 1003 by Carter Kaman, [place] L, RN Outcome: Adequate for Discharge   Problem: Nutrition: Goal: Adequate nutrition will be maintained 12/05/2022 1003 by Harika Laidlaw, [place] L, RN Outcome: Completed/Met 12/05/2022 1003 by Willliam Pettet, [place] L, RN Outcome: Adequate for Discharge   Problem: Coping: Goal: Level of anxiety will decrease 12/05/2022 1003 by Attie Nawabi, [place] L, RN Outcome: Completed/Met 12/05/2022 1003 by Sadler Teschner, [place] L, RN Outcome: Adequate for Discharge   Problem: Elimination: Goal: Will not experience complications related to bowel motility 12/05/2022 1003 by Rollo Farquhar, [place] L, RN Outcome: Completed/Met 12/05/2022 1003 by Juleen Sorrels, [place] L, RN Outcome: Adequate for Discharge Goal: Will not experience complications related to urinary retention 12/05/2022 1003 by Mclane Arora, [place] L, RN Outcome: Completed/Met 12/05/2022 1003 by Aracelis Ulrey, [place] L, RN Outcome: Adequate for Discharge   Problem: Pain Managment: Goal: General experience of comfort will improve 12/05/2022 1003 by Vernor Monnig, [place] L, RN Outcome: Completed/Met 12/05/2022 1003 by Mahalia Dykes, [place] L, RN Outcome: Adequate for Discharge    Problem: Safety: Goal: Ability to remain free from injury will improve 12/05/2022 1003 by Jama Krichbaum, [place] L, RN Outcome: Completed/Met 12/05/2022 1003 by Maecy Podgurski, [place] L, RN Outcome: Adequate for Discharge   Problem: Skin Integrity: Goal: Risk for impaired skin integrity will decrease 12/05/2022 1003 by Ioma Chismar, [place] L, RN Outcome: Completed/Met 12/05/2022 1003  by Ranny Wiebelhaus, [place] L, RN Outcome: Adequate for Discharge

## 2022-12-05 NOTE — Plan of Care (Signed)

## 2022-12-05 NOTE — Plan of Care (Signed)
  Problem: Education: Goal: Knowledge of cardiac device and self-care will improve Outcome: Progressing   Problem: Education: Goal: Knowledge of General Education information will improve Description: Including pain rating scale, medication(s)/side effects and non-pharmacologic comfort measures Outcome: Progressing

## 2022-12-05 NOTE — TOC Initial Note (Signed)
Transition of Care [facility]) - Initial/Assessment Note    Patient Details  Name: [name] MRN: [id number] Date of Birth: [birth year]  [facility] of [facility] CM/SW Contact:    Elliot Cousin, RN Phone Number: [phone] 12/05/2022, 12:12 PM  Clinical Narrative:  Spoke to pt and husband at bedside. Pt has needed DME in the home. Pt reports husband will be there to assist in home as needed. Husband will provide transportation home.                  Expected Discharge Plan: Home/Self Care Barriers to Discharge: No Barriers Identified   Patient Goals and CMS Choice Patient states their goals for this hospitalization and ongoing recovery are:: wants to recover CMS Medicare.gov Compare Post Acute Care list provided to:: Patient        Expected Discharge Plan and Services   Discharge Planning Services: CM Consult   Living arrangements for the past 2 months: Single Family Home Expected Discharge Date: 12/05/22                                    Prior Living Arrangements/Services Living arrangements for the past 2 months: Single Family Home Lives with:: Spouse   Do you feel safe going back to the place where you live?: Yes      Need for Family Participation in Patient Care: Yes (Comment) Care giver support system in place?: Yes (comment) Current home services: DME (rolling walker, cane, shower chair, bars bathroom, reclining lift chair) Criminal Activity/Legal Involvement Pertinent to Current Situation/Hospitalization: No - Comment as needed  Activities of Daily Living      Permission Sought/Granted Permission sought to share information with : Case Manager, Family Supports, PCP Permission granted to share information with : Yes, Verbal Permission Granted  Share Information with NAME: Tereza Morsch     Permission granted to share info w Relationship: husband  Permission granted to share info w Contact Information: [phone]  Emotional  Assessment Appearance:: Appears stated age Attitude/Demeanor/Rapport: Engaged Affect (typically observed): Accepting Orientation: : Oriented to Self, Oriented to Place, Oriented to  Time, Oriented to Situation   Psych Involvement: No (comment)  Admission diagnosis:  Complete heart block (HCC) [I44.2] Patient Active Problem List   Diagnosis Date Noted   Complete heart block (HCC) 12/04/2022   Acute on chronic heart failure with preserved ejection fraction (HFpEF) (HCC) 12/03/2022   S/P TAVR (transcatheter aortic valve replacement) 12/02/2022   Hypertension    History of ventricular tachycardia    GERD (gastroesophageal reflux disease)    Diabetes mellitus without complication (HCC)    Nonrheumatic aortic valve stenosis 10/14/2022   H/O bilateral mastectomy 10/10/2020   Breast cancer (HCC) 10/10/2020   PCP:  Joycelyn Rua, MD Pharmacy:   [facility] [address], [place] [address] [place] [address] Phone: [phone] Fax: [phone]     Social Determinants of Health (SDOH) Social History: SDOH Screenings   Tobacco Use: Medium Risk (12/04/2022)   SDOH Interventions:     Readmission Risk Interventions     No data to display

## 2022-12-05 NOTE — Discharge Summary (Addendum)
ELECTROPHYSIOLOGY PROCEDURE DISCHARGE SUMMARY    Patient ID: [name],  MRN: [id number], DOB/AGE: 1945-04-21 77 y.o.  Admit date: 12/04/2022 Discharge date: 12/05/2022  Primary Care Physician: Jessica Rua, MD  Primary Cardiologist: Dr. Odis [name] Electrophysiologist: Dr. Lalla [name] (new)  Primary Discharge Diagnosis:  CHB  Secondary Discharge Diagnosis:  VHD S/p TAVR 12/02/22 (prior admission) HTN DM  Allergies  Allergen Reactions   Penicillin G Anaphylaxis    Other Reaction(s): dry mouth  Other Reaction(s): Rash/SOB   Penicillins Rash and Shortness Of Breath    Other Reaction(s): rash/sob   Bee Venom Swelling    Bee stings   Morphine Other (See Comments) and Nausea And Vomiting     Procedures This Admission:   1. Successful right IJ temporary pacemaker with a threshold of 0.3 mA, output of 5 mA, and backup rate of 50 bpm. (12/03/21, Dr. Lynnette [name])  2.  Implantation of a MDT dual chamber PPM on 12/04/22 by Dr [name].   There were no immediate post procedure complications. CXR on 12/05/22 demonstrated no pneumothorax status post device implantation.   Brief HPI: [name] is a 77 y.o. female  underwent TAVR 12/02/22, during valve deployment she had a brief asystolic event with spontaneous return of conduction with need to pace/CPR. Discharged yesterday with Zio AT On NO NODAL blocking agents   Early yesterday morning she had an episode of CHB associated with V pause of 9.4 seconds and advised to proceed to the ER. She arrived in CHB, V rates 70's with intermittent CHB/pauses of several seconds, 3--6 wV standstill symptomatic with near syncope   Hospital Course:  The patient was brought emergently to the cath lab for temporary pacing wire by Dr. Lynnette [name] with plans to [facility] when able.  SHe had baseline RBBB, on no nodal blocking agents. She carries hx of VT on her problem list, she reported palpitations since her teens and in her 57's reportedly diagnosed with  VT She stated she had an electrical procedure at [facility] and told she had an electrical problem, "VT" No reports of an ablation, never advised to have any device implants.  Did given her a medication, can not remember the name.  We were unable find any documented hx otherwise No specifics remain unknown She underwent implantation of a PPM with details as outlined in the procedure report.   She was monitored on telemetry overnight which demonstrated SR/V paced.  Left chest was without hematoma or ecchymosis.  The device was interrogated and found to be functioning normally.  Right internal jugular site is stable.  CXR was obtained and demonstrated no pneumothorax status post device implantation.  Wound care, arm mobility, and restrictions were reviewed with the patient.  The patient feels well, denies any CP/SOB, with minimal site discomfort.  She was examined by Dr. Lalla [name] and considered stable for discharge to home.    Physical Exam: Vitals:   12/05/22 0700 12/05/22 0744 12/05/22 0800 12/05/22 0900  BP:   (!) 140/88   Pulse: 85  90   Resp: (!) 29  (!) 8 (!) 24  Temp:  98 F (36.7 C)    TempSrc:  Oral    SpO2: 94%  94%   Weight:      Height:        GEN- The patient is well appearing, alert and oriented x 3 today.   HEENT: normocephalic, atraumatic; sclera clear, conjunctiva pink; hearing intact; oropharynx clear; neck supple, no JVP Lungs-  CTA b/l,  normal work of breathing.  No wheezes, rales, rhonchi Heart- RRR, no murmurs, rubs or gallops, PMI not laterally displaced GI- soft, non-tender, non-distended Extremities- no clubbing, cyanosis, or edema MS- no significant deformity or atrophy Skin- warm and dry, no rash or lesion, left chest without hematoma/ecchymosis Psych- euthymic mood, full affect Neuro- no gross deficits   Labs:   Lab Results  Component Value Date   WBC 8.7 12/04/2022   HGB 14.7 12/04/2022   HCT 44.7 12/04/2022   MCV 84.2 12/04/2022   PLT 144 (L) 12/04/2022     Recent Labs  Lab 12/04/22 0931 12/05/22 0645  NA 136 135  K 3.3* 3.6  CL 100 99  CO2 26 25  BUN 12 9  CREATININE 0.74 0.68  CALCIUM 8.9 8.4*  PROT 6.9  --   BILITOT 1.2  --   ALKPHOS 50  --   ALT 23  --   AST 46*  --   GLUCOSE 178* 151*    Discharge Medications:  Allergies as of 12/05/2022       Reactions   Penicillin G Anaphylaxis   Other Reaction(s): dry mouth Other Reaction(s): Rash/SOB   Penicillins Rash, Shortness Of Breath   Other Reaction(s): rash/sob   Bee Venom Swelling   Bee stings   Morphine Other (See Comments), Nausea And Vomiting        Medication List     TAKE these medications    albuterol 108 (90 Base) MCG/ACT inhaler Commonly known as: VENTOLIN HFA Inhale 2 puffs into the lungs every 4 (four) hours as needed for wheezing or shortness of breath.   B COMPLEX PO Take 1 capsule by mouth daily.   COLLAGEN ULTRA PO Take 1 capsule by mouth daily as needed (Unknown).   lisinopril-hydrochlorothiazide 20-12.5 MG tablet Commonly known as: ZESTORETIC Take 1 tablet by mouth daily.   MULTIVITAMINS PO Take 1 tablet by mouth daily.   naproxen sodium 220 MG tablet Commonly known as: ALEVE Take 440 mg by mouth daily.   Ozempic (0.25 or 0.5 MG/DOSE) 2 MG/3ML Sopn Generic drug: Semaglutide(0.25 or 0.5MG /DOS) Inject 0.25 mg into the skin every Saturday.   pantoprazole 40 MG tablet Commonly known as: PROTONIX Take 40 mg by mouth daily.   polyvinyl alcohol 1.4 % ophthalmic solution Commonly known as: LIQUIFILM TEARS Place 1 drop into both eyes as needed for dry eyes.   potassium chloride 10 MEQ tablet Commonly known as: KLOR-CON M Take 2 tablets (20 mEq total) by mouth 2 (two) times daily.   solifenacin 5 MG tablet Commonly known as: VESICARE Take 5 mg by mouth daily.   torsemide 10 MG tablet Commonly known as: DEMADEX Take 1 tablet (10 mg total) by mouth every morning.   vitamin C 100 MG tablet Take 100 mg by mouth daily.    Vitamin D3 125 MCG (5000 UT) Tabs Take 5,000 Units by mouth daily.   ZINC PO Take 10 mg by mouth daily.        Disposition: home Discharge Instructions     Diet - low sodium heart healthy   Complete by: As directed    Increase activity slowly   Complete by: As directed         Duration of Discharge Encounter: Greater than 30 minutes including physician time.  Norma Fredrickson, PA-C 12/05/2022 10:42 AM

## 2022-12-05 NOTE — Care Management Obs Status (Signed)
MEDICARE OBSERVATION STATUS NOTIFICATION   Patient Details  Name: [name] MRN: [id number] Date of Birth: 1945/11/22   Medicare Observation Status Notification Given:  Yes    Elliot Cousin, RN 12/05/2022, 12:05 PM

## 2022-12-05 NOTE — Care Management CC44 (Signed)
Condition Code 44 Documentation Completed  Patient Details  Name: [name] MRN: [id number] Date of Birth: Mar 09, 1946   Condition Code 44 given:  Yes Patient signature on Condition Code 44 notice:  Yes Documentation of 2 MD's agreement:  Yes Code 44 added to claim:  Yes    Elliot Cousin, RN 12/05/2022, 12:05 PM

## 2022-12-05 NOTE — Discharge Instructions (Addendum)
After Your Pacemaker     ACTIVITY Do not lift your arm above shoulder height for 1 week after your procedure. After 7 days, you may progress as below.  You should remove your sling 24 hours after your procedure, unless otherwise instructed by your provider.     Friday December 12, 2022  Saturday December 13, 2022 Sunday December 14, 2022 Monday December 15, 2022   Do not lift, push, pull, or carry anything over 10 pounds with the affected arm until 6 weeks (Friday January 16, 2023 ) after your procedure.   You may drive AFTER your wound check, unless you have been told otherwise by your provider.   Ask your healthcare provider when you can go back to work   INCISION/Dressing  Monitor your Pacemaker site for redness, swelling, and drainage. Call the device clinic at [phone] if you experience these symptoms or fever/chills.  If your incision is sealed with Steri-strips or staples, you may shower 7 days after your procedure or when told by your provider. Do not remove the steri-strips or let the shower hit directly on your site. You may wash around your site with soap and water.    If you were discharged in a sling, please do not wear this during the day more than 48 hours after your surgery unless otherwise instructed. This may increase the risk of stiffness and soreness in your shoulder.   Avoid lotions, ointments, or perfumes over your incision until it is well-healed.  You may use a hot tub or a pool AFTER your wound check appointment if the incision is completely closed.  Pacemaker Alerts:  Some alerts are vibratory and others beep. These are NOT emergencies. Please call our office to let us know. If this occurs at night or on weekends, it can wait until the next business day. Send a remote transmission.  If your device is capable of reading fluid status (for heart failure), you will be offered monthly monitoring to review this with you.   DEVICE MANAGEMENT Remote monitoring is  used to monitor your pacemaker from home. This monitoring is scheduled every 91 days by our office. It allows [place] to keep an eye on the functioning of your device to ensure it is working properly. You will routinely see your Electrophysiologist annually (more often if necessary).   You should receive your ID card for your new device in 4-8 weeks. Keep this card with you at all times once received. Consider wearing a medical alert bracelet or necklace.  Your Pacemaker may be MRI compatible. This will be discussed at your next office visit/wound check.  You should avoid contact with strong electric or magnetic fields.   Do not use amateur (ham) radio equipment or electric (arc) welding torches. MP3 player headphones with magnets should not be used. Some devices are safe to use if held at least 12 inches (30 cm) from your Pacemaker. These include power tools, lawn mowers, and speakers. If you are unsure if something is safe to use, ask your health care provider.  When using your cell phone, hold it to the ear that is on the opposite side from the Pacemaker. Do not leave your cell phone in a pocket over the Pacemaker.  You may safely use electric blankets, heating pads, computers, and microwave ovens.  Call the office right away if: You have chest pain. You feel more short of breath than you have felt before. You feel more light-headed than you have felt before. Your incision  starts to open up.  This information is not intended to replace advice given to you by your health care provider. Make sure you discuss any questions you have with your health care provider.

## 2022-12-08 ENCOUNTER — Encounter: Payer: Self-pay | Admitting: Physician Assistant

## 2022-12-08 ENCOUNTER — Encounter (HOSPITAL_COMMUNITY): Payer: Self-pay | Admitting: Internal Medicine

## 2022-12-09 ENCOUNTER — Telehealth (HOSPITAL_COMMUNITY): Payer: Self-pay

## 2022-12-09 NOTE — Progress Notes (Unsigned)
[facility]   MULTIDISCIPLINARY HEART VALVE CLINIC                                     Cardiology Office Note:    Date:  12/11/2022   ID:  [name], DOB 10/12/1945, MRN [id number]  PCP:  Joycelyn Rua, MD  [facility] Cardiologist:  Tessa Lerner, DO / Dr. Lynnette Caffey & Dr. Laneta Simmers (TAVR)  [facility] Electrophysiologist:  None   Referring MD: Joycelyn Rua, MD   Chief Complaint  Patient presents with   Follow-up    TOC s/p TAVR   History of Present Illness:    [name] is a 77 y.o. female with a hx of breast cancer, morbid obesity, former tobacco abuse, ventricular tachycardia, DMT2, HTN, RBBB/LAFB and severe LFLG aortic stenosis who presented to [facility] on 12/02/22 for planned TAVR and is being seen today for TOC follow up.     [name] has been followed by Dr. Odis Hollingshead for her cardiology care. She had an echo in 08/2022, but it was not possible to determine the severity of her aortic stenosis due to poor windows related to her mastectomies and reconstructions. TEE on 10/14/2022 which showed EF 55%, and severe AS with mean grad 31 mmHg, AVA 0.81 cm2, SVI 32 and mild-mod MR/TR. [facility] 10/14/22 showed no significant coronary artery disease. The aortic valve simultaneous mean gradient was 25 mmHg with a valve area of 0.94 cm. There is mild pulmonary hypertension at 46/25 with a mean of 31. Pulmonary capillary wedge pressure was 17. She has reported progressive exertional shortness of breath fatigue and generalized weakness since March 2024    She was then evaluated by the multidisciplinary valve team and felt to have severe, symptomatic aortic stenosis and to be a suitable candidate. She is now s/p successful TAVR with a 26 mm Edwards Sapien 3 Ultra Resilia THV via the TF approach on 12/02/22. Post operative echo showed stable valve function and gradients with a 26mm S3UR with an AVA at 3.17cm2 and mean gradient at . She was discharged 12/03/22 with ZIO monitor due to  underlying conduction disease.   Unfortunately our on-call provider was contacted 8/22 by [facility] regarding critical EKG with evidence of a 4.3 second pause and a 10.8 second pausing. This progressed to CHB with rates in the 20-40 range. On call, she was noted to be sleeping but informed that she would need to return to the hospital for further evaluation. She was taken for urgent temporary pacemaker placement with ultimate permanent PPM placement with MDT dual chamber with Dr. Lalla Brothers 12/04/22. Post placement CXR with no pneumothorax.   She is here today with her husband and reports that she has been very well since discharge. PPM site has been stable with no redness or overt pain. She has been following arm movement restrictions. Groin sites look great with no bleeding or hematoma. She reports that she has not been taking ASA 81mg  daily therefore this reiterated that she needs to be on this daily. They will pick that up on the way home and start today. Her breathing has improved and she denies chest pain, SOB, palpitations, LE edema, orthopnea, PND, dizziness, or syncope. No bleeding in stool or urine. She has been ambulating more and can tell she has improved stamina. She has follow with EP for post PPM wound check 9/5.   Past Medical History:  Diagnosis Date   Allergic rhinitis    Asthma    Childhood per patient   Breast cancer, left (HCC) 2007   ductal carcinoma in situ, underwent left mastectomy 07/2005; reconstructive surgery with expander, permanent implant placed 03/2006, hospitalized January 2008 with breast cellulitis.   Breast cancer, right (HCC) 1998   1998; modified radical right mastectomy 1998 for infiltrating ductal adenocarcinoma with 7 of 12 axillary nodes positive. Underwent adjuvant chemotherapy and bone marrow transplant by Dr Greggory Stallion as well as radiation therapy. Also had Tram flap reconstruction.   Diabetes mellitus without complication (HCC)    GERD (gastroesophageal reflux  disease)    Hiatal hernia    History of ventricular tachycardia    Hypertension    Vitamin D deficiency     Past Surgical History:  Procedure Laterality Date   biopsy on scalp  2010   breast implant Left 2007   CESAREAN SECTION     childbirth     63, 66, 67, 70   EYE SURGERY  1952   hysterectomy with one ovary/due to fibroids  1987   INTRAOPERATIVE TRANSTHORACIC ECHOCARDIOGRAM N/A 12/02/2022   Procedure: INTRAOPERATIVE TRANSTHORACIC ECHOCARDIOGRAM;  Surgeon: [name], MD;  Location: [facility] INVASIVE CV LAB;  Service: Open Heart Surgery;  Laterality: N/A;   left mastectomy Left 2007   NASAL SEPTUM SURGERY  1975   PACEMAKER IMPLANT N/A 12/04/2022   Procedure: PACEMAKER IMPLANT;  Surgeon: Lanier Prude, MD;  Location: [facility] INVASIVE CV LAB;  Service: Cardiovascular;  Laterality: N/A;   PORTACATH PLACEMENT  1998   right mastectomy Right 1998   RIGHT/LEFT HEART CATH AND CORONARY ANGIOGRAPHY N/A 10/14/2022   Procedure: RIGHT/LEFT HEART CATH AND CORONARY ANGIOGRAPHY;  Surgeon: Elder Negus, MD;  Location: [facility] INVASIVE CV LAB;  Service: Cardiovascular;  Laterality: N/A;   stem cell infusion  1998   surgery for crossed eyes     TEE WITHOUT CARDIOVERSION N/A 10/14/2022   Procedure: TRANSESOPHAGEAL ECHOCARDIOGRAM;  Surgeon: Elder Negus, MD;  Location: [facility] INVASIVE CV LAB;  Service: Cardiovascular;  Laterality: N/A;   TEMPORARY PACEMAKER N/A 12/04/2022   Procedure: TEMPORARY PACEMAKER;  Surgeon: [name], MD;  Location: [facility] INVASIVE CV LAB;  Service: Cardiovascular;  Laterality: N/A;   TONSILLECTOMY  1961   tran flap R breast Right 1998   TRANSCATHETER AORTIC VALVE REPLACEMENT, TRANSFEMORAL N/A 12/02/2022   Procedure: Transcatheter Aortic Valve Replacement, Transfemoral;  Surgeon: [name], MD;  Location: [facility] INVASIVE CV LAB;  Service: Open Heart Surgery;  Laterality: N/A;   tri-port  1998   TUBAL LIGATION      Current Medications: Current Meds  Medication Sig    albuterol (VENTOLIN HFA) 108 (90 Base) MCG/ACT inhaler Inhale 2 puffs into the lungs every 4 (four) hours as needed for wheezing or shortness of breath.   Ascorbic Acid (VITAMIN C) 100 MG tablet Take 100 mg by mouth daily.   aspirin EC 81 MG tablet Take 1 tablet (81 mg total) by mouth daily. Swallow whole.   B Complex Vitamins (B COMPLEX PO) Take 1 capsule by mouth daily.   Cholecalciferol (VITAMIN D3) 125 MCG (5000 UT) TABS Take 5,000 Units by mouth daily.   lisinopril-hydrochlorothiazide (ZESTORETIC) 20-12.5 MG tablet Take 1 tablet by mouth daily.   Multiple Vitamin (MULTIVITAMINS PO) Take 1 tablet by mouth daily.   Multiple Vitamins-Minerals (ZINC PO) Take 10 mg by mouth daily.   naproxen sodium (ALEVE) 220 MG tablet Take 440 mg by mouth daily.  OZEMPIC, 0.25 OR 0.5 MG/DOSE, 2 MG/3ML SOPN Inject 0.25 mg into the skin every Saturday.   pantoprazole (PROTONIX) 40 MG tablet Take 40 mg by mouth daily.   polyvinyl alcohol (LIQUIFILM TEARS) 1.4 % ophthalmic solution Place 1 drop into both eyes as needed for dry eyes.   potassium chloride SA (KLOR-CON M) 10 MEQ tablet Take 2 tablets (20 mEq total) by mouth 2 (two) times daily.   solifenacin (VESICARE) 5 MG tablet Take 5 mg by mouth daily.   Specialty Vitamins Products (COLLAGEN ULTRA PO) Take 1 capsule by mouth daily as needed (Unknown).   torsemide (DEMADEX) 10 MG tablet Take 1 tablet (10 mg total) by mouth every morning.     Allergies:   Penicillin g, Penicillins, Bee venom, and Morphine   Social History   Socioeconomic History   Marital status: Married    Spouse name: Not on file   Number of children: 4   Years of education: Not on file   Highest education level: Some college, no degree  Occupational History   Occupation: Semi-retired working for [employer]  Tobacco Use   Smoking status: Former    Current packs/day: 0.00    Average packs/day: 1 pack/day for 36.0 years (36.0 ttl pk-yrs)    Types: Cigarettes    Start date:  36    Quit date: 1998    Years since quitting: 26.6   Smokeless tobacco: Never   Tobacco comments:    At first maybe 1 pack per week then it moved up to 1 ppd later.  Vaping Use   Vaping status: Never Used  Substance and Sexual Activity   Alcohol use: Yes    Comment: Brandy once in a while to help with sleep   Drug use: Never   Sexual activity: Not on file  Other Topics Concern   Not on file  Social History Narrative   Lives at home with spouse   Right handed   Caffeine: coffee or tea, 2/day       2 deceased sons , 1 son passed from heart attack   Social Determinants of Health   Financial Resource Strain: Not on file  Food Insecurity: Not on file  Transportation Needs: Not on file  Physical Activity: Not on file  Stress: Not on file  Social Connections: Not on file    Family History: The patient's family history includes Arrhythmia in her sister; Blindness in her paternal grandfather; Breast cancer in her paternal aunt; Dementia in her mother; Diabetes in her paternal grandmother; Heart Problems in her maternal grandfather and paternal grandmother; Hypertension in her maternal grandmother and sister; Prostate cancer in her father. There is no history of Neuropathy.  ROS:   Please see the history of present illness.    All other systems reviewed and are negative.  EKGs/Labs/Other Studies Reviewed:    The following studies were reviewed today:  Cardiac Studies & Procedures   CARDIAC CATHETERIZATION  CARDIAC CATHETERIZATION 12/04/2022  Narrative 1.  Successful right IJ temporary pacemaker with a threshold of 0.3 mA, output of 5 mA, and backup rate of 50 bpm.  Recommendation: Management per EP service.   CARDIAC CATHETERIZATION  CARDIAC CATHETERIZATION 10/14/2022  Narrative Images from the original result were not included.    LV end diastolic pressure is mildly elevated.   No indication for antiplatelet therapy at this time .  LM: Normal. No significant  disease LAD: Normal. No significant disease Lcx: Dominant. No significant disease RCA: Nondominant. No significant  disease  RA: 8 mmHg RV: 49/6 mmHg PA:46/25 mmHg, mPAP 31 mmHg PCW: 17 mmHg  CO: 5.3 L.min CI: 2.6 L/min/m2  Simultaneous LV-AO measurement using Langston cathteer Aortic valve mean PG 25 mmHg, AVA 0.94 cm2  Conclusion: No significant coronary artery disease Mildly elevated filling pressures Mild PH, WHO Grp II Aortic stenosis, moderate by gradients, severe by area. In the correct context, aortic stenosis could possibly be paradoxically low flow low gradient. Alternatively, dyspnea could be due to mild PH, possible diastolic dysfunction. Recommend clinical correlation.   Elder Negus, MD Pager: [phone] Office: [phone]  Findings Coronary Findings Diagnostic  Dominance: Left  Left Main Vessel is normal in caliber. Vessel is angiographically normal.  Left Anterior Descending Vessel is normal in caliber. Vessel is angiographically normal.  Left Circumflex Vessel is normal in caliber. Vessel is angiographically normal.  Right Coronary Artery Vessel is small. Vessel is angiographically normal.  Intervention  No interventions have been documented.     ECHOCARDIOGRAM  ECHOCARDIOGRAM COMPLETE 12/03/2022  Narrative ECHOCARDIOGRAM REPORT    Patient Name:   INISHA AQUIRRE Date of Exam: 12/03/2022 Medical Rec #:  [id number]     Height:       64.0 in Accession #:    [id number]    Weight:       207.7 lb Date of Birth:  Jul 13, 1945     BSA:          1.988 m Patient Age:    77 years      BP:           156/89 mmHg Patient Gender: F             HR:           97 bpm. Exam Location:  Inpatient  Procedure: 2D Echo, Cardiac Doppler and Color Doppler  Indications:    I35.0 Nonrheumatic aortic (valve) stenosis  History:        Patient has prior history of Echocardiogram examinations, most recent 12/02/2022. Previous Myocardial Infarction,  Abnormal ECG, Aortic Valve Disease, Arrythmias:Atrial Fibrillation and VT; Risk Factors:Current Smoker and Hypertension. Aortic stenosis. Breast cancer. Aortic Valve: 26 mm Sapien prosthetic, stented (TAVR) valve is present in the aortic position. Procedure Date: 12/02/2022.  Sonographer:    Sheralyn Boatman RDCS Referring Phys: [id number] [facility] R THOMPSON   Sonographer Comments: Technically difficult study due to poor echo windows and patient is obese. Image acquisition challenging due to patient body habitus and Image acquisition challenging due to breast implants. No on-axis apical could be obtained due to implant. Patient very sensitive to pressure from probe. IMPRESSIONS   1. The aortic valve has been repaired/replaced. Aortic valve regurgitation is not visualized. There is a 26 mm Sapien prosthetic (TAVR) valve present in the aortic position. Procedure Date: 12/02/2022. Echo findings are consistent with normal structure and function of the aortic valve prosthesis. Aortic valve area, by VTI measures 3.17 cm. Aortic valve mean gradient measures 4.0 mmHg. Aortic valve Vmax measures 1.22 m/s. 2. Left ventricular ejection fraction, by estimation, is 60 to 65%. Left ventricular ejection fraction by 2D MOD biplane is 61.6 %. The left ventricle has normal function. The left ventricle has no regional wall motion abnormalities. There is moderate asymmetric left ventricular hypertrophy of the basal-septal segment. Indeterminate diastolic filling due to E-A fusion. 3. Right ventricular systolic function is normal. The right ventricular size is normal. Tricuspid regurgitation signal is inadequate for assessing PA pressure. 4. The mitral valve is grossly normal. Trivial mitral  valve regurgitation. No evidence of mitral stenosis. 5. The inferior vena cava is dilated in size with >50% respiratory variability, suggesting right atrial pressure of 8 mmHg.  Comparison(s): No significant change from prior  study.  FINDINGS Left Ventricle: Left ventricular ejection fraction, by estimation, is 60 to 65%. Left ventricular ejection fraction by 2D MOD biplane is 61.6 %. The left ventricle has normal function. The left ventricle has no regional wall motion abnormalities. The left ventricular internal cavity size was normal in size. There is moderate asymmetric left ventricular hypertrophy of the basal-septal segment. Indeterminate diastolic filling due to E-A fusion.  Right Ventricle: The right ventricular size is normal. No increase in right ventricular wall thickness. Right ventricular systolic function is normal. Tricuspid regurgitation signal is inadequate for assessing PA pressure.  Left Atrium: Left atrial size was normal in size.  Right Atrium: Right atrial size was normal in size.  Pericardium: There is no evidence of pericardial effusion. Presence of epicardial fat layer.  Mitral Valve: The mitral valve is grossly normal. Trivial mitral valve regurgitation. No evidence of mitral valve stenosis.  Tricuspid Valve: The tricuspid valve is grossly normal. Tricuspid valve regurgitation is not demonstrated. No evidence of tricuspid stenosis.  Aortic Valve: The aortic valve has been repaired/replaced. Aortic valve regurgitation is not visualized. Aortic valve mean gradient measures 4.0 mmHg. Aortic valve peak gradient measures 6.0 mmHg. Aortic valve area, by VTI measures 3.17 cm. There is a 26 mm Sapien prosthetic, stented (TAVR) valve present in the aortic position. Procedure Date: 12/02/2022. Echo findings are consistent with normal structure and function of the aortic valve prosthesis.  Pulmonic Valve: The pulmonic valve was grossly normal. Pulmonic valve regurgitation is not visualized. No evidence of pulmonic stenosis.  Aorta: The aortic root and ascending aorta are structurally normal, with no evidence of dilitation.  Venous: The inferior vena cava is dilated in size with greater than 50%  respiratory variability, suggesting right atrial pressure of 8 mmHg.  IAS/Shunts: The atrial septum is grossly normal.   LEFT VENTRICLE PLAX 2D                        Biplane EF (MOD) LVIDd:         4.20 cm         LV Biplane EF:   Left LVIDs:         2.70 cm                          ventricular LV PW:         1.30 cm                          ejection LV IVS:        1.50 cm                          fraction by LVOT diam:     2.30 cm                          2D MOD LV SV:         64                               biplane is LV SV Index:   32  61.6 %. LVOT Area:     4.15 cm Diastology LV e' medial:    5.33 cm/s LV Volumes (MOD)               LV E/e' medial:  18.2 LV vol d, MOD    58.3 ml       LV e' lateral:   4.24 cm/s A2C:                           LV E/e' lateral: 22.9 LV vol d, MOD    155.0 ml A4C: LV vol s, MOD    23.0 ml A2C: LV vol s, MOD    59.0 ml A4C: LV SV MOD A2C:   35.3 ml LV SV MOD A4C:   155.0 ml LV SV MOD BP:    77.3 ml  IVC IVC diam: 2.10 cm  LEFT ATRIUM           Index        RIGHT ATRIUM          Index LA diam:      3.70 cm 1.86 cm/m   RA Area:     6.33 cm LA Vol (A2C): 10.6 ml 5.33 ml/m   RA Volume:   8.98 ml  4.52 ml/m LA Vol (A4C): 20.0 ml 10.06 ml/m AORTIC VALVE AV Area (Vmax):    3.53 cm AV Area (Vmean):   3.22 cm AV Area (VTI):     3.17 cm AV Vmax:           122.25 cm/s AV Vmean:          83.725 cm/s AV VTI:            0.200 m AV Peak Grad:      6.0 mmHg AV Mean Grad:      4.0 mmHg LVOT Vmax:         104.00 cm/s LVOT Vmean:        64.900 cm/s LVOT VTI:          0.153 m LVOT/AV VTI ratio: 0.76  AORTA Ao Root diam: 3.40 cm Ao Asc diam:  3.20 cm  MITRAL VALVE MV Area (PHT): 5.38 cm    SHUNTS MV Decel Time: 141 msec    Systemic VTI:  0.15 m MV E velocity: 96.90 cm/s  Systemic Diam: 2.30 cm  Lennie Odor MD Electronically signed by Lennie Odor MD Signature Date/Time: 12/03/2022/11:34:32  AM    Final   TEE  ECHO TEE 10/14/2022  Narrative TRANSESOPHOGEAL ECHO REPORT    Patient Name:   REYNALDO VOLTAIRE Date of Exam: 10/14/2022 Medical Rec #:  [id number]     Height:       64.0 in Accession #:    [id number]    Weight:       218.0 lb Date of Birth:  February 16, 1946     BSA:          2.029 m Patient Age:    77 years      BP:           145/91 mmHg Patient Gender: F             HR:           78 bpm. Exam Location:  Inpatient  Procedure: Transesophageal Echo, Cardiac Doppler and Color Doppler  Indications:    Nonrheumatic aortic valve stenosis I35  History:        Patient has prior  history of Echocardiogram examinations, most recent 09/13/2022. Aortic Valve Disease.  Sonographer:    Dondra Prader RVT RCS Referring Phys: [id number] [facility] J PATWARDHAN  PROCEDURE: After discussion of the risks and benefits of a TEE, an informed consent was obtained from the patient. The transesophogeal probe was passed without difficulty through the esophogus of the patient. Local oropharyngeal anesthetic was provided with viscous lidocaine. Sedation performed by different physician. The patient developed no complications during the procedure.  IMPRESSIONS   1. Left ventricular ejection fraction, by estimation, is 55 to 60%. The left ventricle has normal function. 2. Right ventricular systolic function is normal. The right ventricular size is normal. 3. No left atrial/left atrial appendage thrombus was detected. 4. The mitral valve is normal in structure. Mild to moderate mitral valve regurgitation. No evidence of mitral stenosis. 5. Tricuspid valve regurgitation is mild to moderate. 6. Aortic stenosis severe by AVA, moderate by gradients, velocity, as well as dimensionless index 0.28. Marland Kitchen The aortic valve is calcified. Aortic valve regurgitation is not visualized. Aortic valve area, by VTI measures 0.81 cm. Aortic valve mean gradient measures 31.0 mmHg. Aortic valve Vmax measures 3.57 m/s. 7. There is  mild (Grade II) plaque.  FINDINGS Left Ventricle: Left ventricular ejection fraction, by estimation, is 55 to 60%. The left ventricle has normal function. The left ventricular internal cavity size was normal in size.  Right Ventricle: The right ventricular size is normal. No increase in right ventricular wall thickness. Right ventricular systolic function is normal.  Left Atrium: Left atrial size was normal in size. No left atrial/left atrial appendage thrombus was detected.  Right Atrium: Right atrial size was normal in size.  Pericardium: There is no evidence of pericardial effusion.  Mitral Valve: The mitral valve is normal in structure. Mild to moderate mitral valve regurgitation. No evidence of mitral valve stenosis.  Tricuspid Valve: The tricuspid valve is normal in structure. Tricuspid valve regurgitation is mild to moderate. No evidence of tricuspid stenosis.  Aortic Valve: Aortic stenosis severe by AVA, moderate by gradients, velocity, as well as dimensionless index 0.28. The aortic valve is calcified. Aortic valve regurgitation is not visualized. Aortic valve mean gradient measures 31.0 mmHg. Aortic valve peak gradient measures 51.0 mmHg. Aortic valve area, by VTI measures 0.81 cm.  Pulmonic Valve: The pulmonic valve was normal in structure. Pulmonic valve regurgitation is not visualized. No evidence of pulmonic stenosis.  Aorta: The aortic root is normal in size and structure. There is mild (Grade II) plaque.  IAS/Shunts: No atrial level shunt detected by color flow Doppler.   LEFT VENTRICLE PLAX 2D LVOT diam:     1.90 cm LV SV:         64 LV SV Index:   32 LVOT Area:     2.84 cm   AORTIC VALVE AV Area (Vmax):    0.79 cm AV Area (Vmean):   0.72 cm AV Area (VTI):     0.81 cm AV Vmax:           357.00 cm/s AV Vmean:          255.500 cm/s AV VTI:            0.791 m AV Peak Grad:      51.0 mmHg AV Mean Grad:      31.0 mmHg LVOT Vmax:         100.00 cm/s LVOT  Vmean:        65.000 cm/s LVOT VTI:  0.226 m LVOT/AV VTI ratio: 0.29   SHUNTS Systemic VTI:  0.23 m Systemic Diam: 1.90 cm  Truett Mainland MD Electronically signed by Truett Mainland MD Signature Date/Time: 10/14/2022/11:24:53 AM    Final   MONITORS  LONG TERM MONITOR (3-14 DAYS) 12/04/2022   CT SCANS  CT CORONARY MORPH W/CTA COR W/SCORE 11/11/2022  Addendum 11/11/2022  1:38 PM ADDENDUM REPORT: 11/11/2022 13:36  EXAM: OVER-READ INTERPRETATION  CT CHEST  The following report is an over-read performed by radiologist Dr. Jacob Moores [facility] on 11/11/2022. This over-read does not include interpretation of cardiac or coronary anatomy or pathology. The cardiac TAVR interpretation by the cardiologist is attached.  COMPARISON:  None.  FINDINGS: Extracardiac findings will be described separately under dictation for contemporaneously obtained CTA chest, abdomen and pelvis.  IMPRESSION: Please see separate dictation for contemporaneously obtained CTA chest, abdomen and pelvis dated 11/11/2022 for full description of relevant extracardiac findings.   Electronically Signed By: Allegra Lai M.D. On: 11/11/2022 13:36  Narrative CLINICAL DATA:  Severe Aortic Stenosis.  EXAM: Cardiac TAVR CT  TECHNIQUE: A non-contrast, gated CT scan was obtained with axial slices of 3 mm through the heart for aortic valve calcium scoring. A 100 kV retrospective, gated, contrast cardiac scan was obtained. Gantry rotation speed was 250 msecs and collimation was 0.6 mm. Nitroglycerin was not given. The 3D data set was reconstructed in 5% intervals of the 0-95% of the R-R cycle. Systolic and diastolic phases were analyzed on a dedicated workstation using MPR, MIP, and VRT modes. The patient received 100 cc of contrast.  FINDINGS: Image quality: Excellent.  Noise artifact is: Limited.  Valve Morphology: Tricuspid aortic valve with diffuse  severe calcifications. Bulky calcification of the RCC/NCC. Restricted leaflet movement in systole.  Aortic Valve Calcium score: 1952  Aortic annular dimension:  Phase assessed: 25%  Annular area: 464 mm2  Annular perimeter: 77.0 mm  Max diameter: 26.6 mm  Min diameter: 22.9 mm  Annular and subannular calcification: No annular calcium. There is moderate subannular calcium under the LCC extending to the AMVL.  Membranous septum length: 5.7 mm  Optimal coplanar projection: LAO 17 CRA 2  Coronary Artery Height above Annulus:  Left Main: 11.7 mm  Right Coronary: 18.7 mm  Sinus of Valsalva Measurements:  Non-coronary: 31 mm  Right-coronary: 29 mm  Left-coronary: 32 mm  Sinus of Valsalva Height:  Non-coronary: 20.3 mm  Right-coronary: 21.7 mm  Left-coronary: 21.6 mm  Sinotubular Junction: 28 mm  Ascending Thoracic Aorta: 33 mm  Coronary Arteries: Normal coronary origin. Left dominance. The study was performed without use of NTG and is insufficient for plaque evaluation. Please refer to recent cardiac catheterization for coronary assessment.  Cardiac Morphology:  Right Atrium: Right atrial size is within normal limits. Contrast reflux into the IVC consistent with elevated RA pressure.  Right Ventricle: The right ventricular cavity is within normal limits.  Left Atrium: Left atrial size is normal in size with no left atrial appendage filling defect.  Left Ventricle: The ventricular cavity size is within normal limits.  Pulmonary arteries: Dilated pulmonary artery suggestive of pulmonary hypertension.  Pulmonary veins: Normal pulmonary venous drainage.  Pericardium: Normal thickness with no significant effusion or calcium present.  Mitral Valve: The mitral valve is degenerative with moderate annular calcium of the AMVL that extends into the LVOT.  Extra-cardiac findings: See attached radiology report for non-cardiac structures.  IMPRESSION: 1.  Annular measurements support a 26 mm S3 or 29 mm Evolut Pro.  2. There is moderate subannular calcium under the LCC extending to the AMVL.  3. Sufficient coronary to annulus distance.  4. Optimal Fluoroscopic Angle for Delivery: LAO 17 CRA 2  5. Dilated pulmonary artery suggestive of pulmonary hypertension.  Gerri Spore T. Flora Lipps, MD  Electronically Signed: By: Lennie Odor M.D. On: 11/11/2022 13:27          EKG:  EKG is not ordered today.  Post PPM EKG reviewed from 12/05/22 with V paced rhythm.   Recent Labs: 12/04/2022: ALT 23; Hemoglobin 14.7; Magnesium 1.7; Platelets 144; TSH 3.059 12/05/2022: BUN 9; Creatinine, Ser 0.68; Potassium 3.6; Sodium 135   Recent Lipid Panel No results found for: "CHOL", "TRIG", "HDL", "CHOLHDL", "VLDL", "LDLCALC", "LDLDIRECT"  Physical Exam:    VS:  BP 114/68   Pulse 89   Ht 5\' 4"  (1.626 m)   Wt 209 lb 9.6 oz (95.1 kg)   SpO2 96%   BMI 35.98 kg/m     Wt Readings from Last 3 Encounters:  12/10/22 209 lb 9.6 oz (95.1 kg)  12/04/22 207 lb 10.8 oz (94.2 kg)  12/03/22 207 lb 10.8 oz (94.2 kg)    General: Well developed, well nourished, NAD Lungs:Clear to ausculation bilaterally. No wheezes, rales, or rhonchi. Breathing is unlabored. Cardiovascular: RRR with S1 S2. No murmurs Extremities: No edema. No clubbing or cyanosis. DP/PT pulses 2+ bilaterally Neuro: Alert and oriented. No focal deficits. No facial asymmetry. MAE spontaneously. Psych: Responds to questions appropriately with normal affect.    ASSESSMENT/PLAN:    Severe AS: s/p successful TAVR with a 26 mm Edwards Sapien 3 Ultra Resilia THV via the TF approach on 12/02/22. Post operative echo with stable 26mm S3UR valve with a mean gradient at . Groin sites remain stable. Reports she had not been taking ASA 81mg  daily therefore instructed to start this today. No need for dental SBE as she is edentulous. Plan one month follow up with repeat echo.    Chronic diastolic CHF: Volume  stable today on exam with NYHA class Reviewed proper management with daily weights. They are to buy scales today as well otherwise continue current Lisinopril-HCT and torsemide dosing.   CHB s/p PPM: Noted to be high risk for post TAVR arrhthymias therefore ZIO placed at discharge. Began having pauses with progression to CHB and is now s/p PPM MDT placement. Sees EP for wound check 9/5. Doing well with stable site. Understands arm movement restrictions. H   HTN: Stable with no changes to therapy today.    DMT2: Follow with PCP. Continue current regimen    Morbid obesity: Body mass index is 35.65 kg/m. Resume Ozempic.   Elevated Raise Score: with underlying RBBB and severe AS, she is at increased risk for cardiac amyloid. Discussed today and will place order for PYP scan.   Medication Adjustments/Labs and Tests Ordered: Current medicines are reviewed at length with the patient today.  Concerns regarding medicines are outlined above.  No orders of the defined types were placed in this encounter.  Meds ordered this encounter  Medications   aspirin EC 81 MG tablet    Sig: Take 1 tablet (81 mg total) by mouth daily. Swallow whole.    Patient Instructions  Medication Instructions:  Your physician has recommended you make the following change in your medication:  1-START Aspirin 81 mg by mouth daily.  *If you need a refill on your cardiac medications before your next appointment, please call your pharmacy*  Lab Work: If you have labs (blood  work) drawn today and your tests are completely normal, you will receive your results only by: MyChart Message (if you have MyChart) OR A paper copy in the mail If you have any lab test that is abnormal or we need to change your treatment, we will call you to review the results.  Testing/Procedures: None ordered today.  Follow-Up: At [facility], you and your health needs are our priority.  As part of our continuing mission to provide  you with exceptional heart care, we have created designated Provider Care Teams.  These Care Teams include your primary Cardiologist (physician) and Advanced Practice Providers (APPs -  Physician Assistants and Nurse Practitioners) who all work together to provide you with the care you need, when you need it.  We recommend signing up for the patient portal called "MyChart".  Sign up information is provided on this After Visit Summary.  MyChart is used to connect with patients for Virtual Visits (Telemedicine).  Patients are able to view lab/test results, encounter notes, upcoming appointments, etc.  Non-urgent messages can be sent to your provider as well.   To learn more about what you can do with MyChart, go to [email or web address].    Your next appointment:   1 month(s)   Provider:   Georgie Chard, NP       Signed, Georgie Chard, NP  12/11/2022 10:54 AM    [facility]

## 2022-12-09 NOTE — Telephone Encounter (Signed)
Called and spoke with pt in regards to CR, pt stated [facility] is too far for her drive, pt not interested at this time.   Closed referral

## 2022-12-09 NOTE — Telephone Encounter (Signed)
Pt insurance is active and benefits verified through [facility]. Co-pay $25.00, DED $0.00/$0.00 met, out of pocket $3,600.00/$1,045.64 met, co-insurance 0%. No pre-authorization required. Passport, 12/09/22 @ 2:28PM, REF#[id number]   How many CR sessions are covered? (36 visits for TCR, 72 visits for ICR)72 Is this a lifetime maximum or an annual maximum? Lifetime Has the member used any of these services to date? No Is there a time limit (weeks/months) on start of program and/or program completion? No     Will contact patient to see if she is interested in the Cardiac Rehab Program. If interested, patient will need to complete follow up appt. Once completed, patient will be contacted for scheduling upon review by the RN Navigator.

## 2022-12-10 ENCOUNTER — Ambulatory Visit: Payer: Medicare HMO | Attending: Internal Medicine | Admitting: Cardiology

## 2022-12-10 VITALS — BP 114/68 | HR 89 | Ht 64.0 in | Wt 209.6 lb

## 2022-12-10 DIAGNOSIS — Z950 Presence of cardiac pacemaker: Secondary | ICD-10-CM

## 2022-12-10 DIAGNOSIS — I350 Nonrheumatic aortic (valve) stenosis: Secondary | ICD-10-CM

## 2022-12-10 DIAGNOSIS — I10 Essential (primary) hypertension: Secondary | ICD-10-CM

## 2022-12-10 DIAGNOSIS — Z952 Presence of prosthetic heart valve: Secondary | ICD-10-CM | POA: Diagnosis not present

## 2022-12-10 DIAGNOSIS — I4510 Unspecified right bundle-branch block: Secondary | ICD-10-CM

## 2022-12-10 DIAGNOSIS — I442 Atrioventricular block, complete: Secondary | ICD-10-CM

## 2022-12-10 DIAGNOSIS — I5033 Acute on chronic diastolic (congestive) heart failure: Secondary | ICD-10-CM | POA: Diagnosis not present

## 2022-12-10 MED ORDER — ASPIRIN 81 MG PO TBEC: 81.0000 mg | DELAYED_RELEASE_TABLET | Freq: Every day | ORAL | Status: DC

## 2022-12-10 NOTE — Patient Instructions (Signed)
Medication Instructions:  Your physician has recommended you make the following change in your medication:  1-START Aspirin 81 mg by mouth daily.  *If you need a refill on your cardiac medications before your next appointment, please call your pharmacy*  Lab Work: If you have labs (blood work) drawn today and your tests are completely normal, you will receive your results only by: MyChart Message (if you have MyChart) OR A paper copy in the mail If you have any lab test that is abnormal or we need to change your treatment, we will call you to review the results.  Testing/Procedures: None ordered today.  Follow-Up: At [facility], you and your health needs are our priority.  As part of our continuing mission to provide you with exceptional heart care, we have created designated Provider Care Teams.  These Care Teams include your primary Cardiologist (physician) and Advanced Practice Providers (APPs -  Physician Assistants and Nurse Practitioners) who all work together to provide you with the care you need, when you need it.  We recommend signing up for the patient portal called "MyChart".  Sign up information is provided on this After Visit Summary.  MyChart is used to connect with patients for Virtual Visits (Telemedicine).  Patients are able to view lab/test results, encounter notes, upcoming appointments, etc.  Non-urgent messages can be sent to your provider as well.   To learn more about what you can do with MyChart, go to [email or web address].    Your next appointment:   1 month(s)   Provider:   Georgie Chard, NP

## 2022-12-18 ENCOUNTER — Telehealth: Payer: Self-pay

## 2022-12-18 ENCOUNTER — Ambulatory Visit: Payer: Medicare HMO | Attending: Cardiovascular Disease

## 2022-12-18 DIAGNOSIS — I480 Paroxysmal atrial fibrillation: Secondary | ICD-10-CM

## 2022-12-18 DIAGNOSIS — I442 Atrioventricular block, complete: Secondary | ICD-10-CM

## 2022-12-18 LAB — CUP PACEART INCLINIC DEVICE CHECK
Battery Remaining Longevity: 127 mo
Battery Voltage: 3.08 V
Brady Statistic RA Percent Paced: 0.73 %
Brady Statistic RV Percent Paced: 99.82 %
Date Time Interrogation Session: 20240905214743
Implantable Lead Connection Status: 753985
Implantable Lead Connection Status: 753985
Implantable Lead Implant Date: 20240822
Implantable Lead Implant Date: 20240822
Implantable Lead Location: 753859
Implantable Lead Location: 753860
Implantable Pulse Generator Implant Date: 20240822
Lead Channel Impedance Value: 387.5 Ohm
Lead Channel Impedance Value: 562.5 Ohm
Lead Channel Pacing Threshold Amplitude: 0.75 V
Lead Channel Pacing Threshold Amplitude: 0.75 V
Lead Channel Pacing Threshold Amplitude: 0.75 V
Lead Channel Pacing Threshold Amplitude: 0.75 V
Lead Channel Pacing Threshold Pulse Width: 0.5 ms
Lead Channel Pacing Threshold Pulse Width: 0.5 ms
Lead Channel Pacing Threshold Pulse Width: 0.5 ms
Lead Channel Pacing Threshold Pulse Width: 0.5 ms
Lead Channel Sensing Intrinsic Amplitude: 4.5 mV
Lead Channel Setting Pacing Amplitude: 0.875
Lead Channel Setting Pacing Amplitude: 1.625
Lead Channel Setting Pacing Pulse Width: 0.5 ms
Lead Channel Setting Sensing Sensitivity: 4 mV
Pulse Gen Model: 2272
Pulse Gen Serial Number: 8206065

## 2022-12-18 MED ORDER — APIXABAN 5 MG PO TABS
5.0000 mg | ORAL_TABLET | Freq: Two times a day (BID) | ORAL | 6 refills | Status: DC
Start: 2022-12-18 — End: 2023-02-25

## 2022-12-18 NOTE — Telephone Encounter (Signed)
Patient in for 10 day post op new dual chamber pacemaker implant for CHB following TAVR.   Device interrogation today reveals 68 AMS episodes, all appear AF/Flutter.   Multiple bursts over past 2 days with longest being 29 minutes.  (See Paceart and device interrogation note for full details).  Reviewed with Dr. Lalla Brothers.  Given patient's CHADS VASC risk, determined appropriate to go ahead and initiate Eliquis 5mg  bid.    Dr. Lalla Brothers discussed risks v/s benefits and need to start [facility].  She is to stop ASA and start Eliquis 5mg  bid with follow up in the Afib clinic within the next 2 weeks.   Patient verbalizes understanding and referral made to Afib clinic.

## 2022-12-18 NOTE — Patient Instructions (Addendum)
   After Your Pacemaker   Monitor your pacemaker site for redness, swelling, and drainage. Call the device clinic at [phone] if you experience these symptoms or fever/chills.     You have an irregular heart rhythm known as atrial fibrillation.  As a result your doctor has determined that it is important for you to start a blood thinner:                        Eliquis 5mg  take 1 tablet twice daily.  STOP YOUR ASPIRIN.   You will receive a call from our Afib clinic in the next few days to schedule a follow up appointment regarding your atrial fibrillation and new medication start.   Your incision was closed with Steri-strips or staples:  You may shower 7 days after your procedure and wash your incision with soap and water. Avoid lotions, ointments, or perfumes over your incision until it is well-healed.  You may use a hot tub or a pool after your wound check appointment if the incision is completely closed.  Do not lift, push or pull greater than 10 pounds with the affected arm until 6 weeks after your procedure. UNTIL AFTER OCTOBER 10TH. There are no other restrictions in arm movement after your wound check appointment.  You may drive, unless driving has been restricted by your healthcare providers.  Remote monitoring is used to monitor your pacemaker from home. This monitoring is scheduled every 91 days by our office. It allows [place] to keep an eye on the functioning of your device to ensure it is working properly. You will routinely see your Electrophysiologist annually (more often if necessary).

## 2022-12-18 NOTE — Progress Notes (Signed)
Wound check appointment. Steri-strips removed. Wound without redness or edema. Incision edges approximated, wound well healed. Normal device function. Thresholds, sensing, and impedances consistent with implant measurements. Device programmed with auto capture programmed on with appropriate safety margin. Histogram distribution appropriate for patient and level of activity. There are 68 AMS episodes, majority appear bursts of true AFib, longest is 29 minutes. There is some undersensing of AF.  No high ventricular rates noted. Patient educated about wound care, arm mobility, lifting restrictions. ROV in 3 months with implanting physician.  Dr. Lalla Brothers reviewed new onset diagnosis of AF with patient, including risks v/s benefits with need to start [facility].  Eliquis 5mg  bid started today with referral made to AF clinic to follow up in the next 2 weeks.  Patient verbalizes understanding of all instructions given today.   PROGRAMMING CHANGE: RA sensitivity decreased from 0.79mv to 0.16mv.

## 2022-12-19 ENCOUNTER — Telehealth: Payer: Self-pay | Admitting: Pharmacy Technician

## 2022-12-19 ENCOUNTER — Other Ambulatory Visit (HOSPITAL_COMMUNITY): Payer: Self-pay

## 2022-12-19 DIAGNOSIS — I350 Nonrheumatic aortic (valve) stenosis: Secondary | ICD-10-CM | POA: Diagnosis not present

## 2022-12-19 DIAGNOSIS — I442 Atrioventricular block, complete: Secondary | ICD-10-CM | POA: Diagnosis not present

## 2022-12-19 DIAGNOSIS — I5032 Chronic diastolic (congestive) heart failure: Secondary | ICD-10-CM | POA: Diagnosis not present

## 2022-12-19 DIAGNOSIS — R0602 Shortness of breath: Secondary | ICD-10-CM | POA: Diagnosis not present

## 2022-12-19 DIAGNOSIS — E119 Type 2 diabetes mellitus without complications: Secondary | ICD-10-CM | POA: Diagnosis not present

## 2022-12-19 DIAGNOSIS — I10 Essential (primary) hypertension: Secondary | ICD-10-CM | POA: Diagnosis not present

## 2022-12-19 DIAGNOSIS — E1159 Type 2 diabetes mellitus with other circulatory complications: Secondary | ICD-10-CM | POA: Diagnosis not present

## 2022-12-19 DIAGNOSIS — I110 Hypertensive heart disease with heart failure: Secondary | ICD-10-CM | POA: Diagnosis not present

## 2022-12-19 NOTE — Telephone Encounter (Signed)
Pharmacy Patient Advocate Encounter  Insurance verification completed.    The patient is insured through [facility]   Ran test claim for eliquis. Rejection says refill too soon. Next available fill 01/11/23   This test claim was processed through [facility]- copay amounts may vary at other pharmacies due to pharmacy/plan contracts, or as the patient moves through the different stages of their insurance plan.

## 2022-12-30 ENCOUNTER — Other Ambulatory Visit: Payer: Self-pay | Admitting: Cardiology

## 2022-12-30 DIAGNOSIS — Z952 Presence of prosthetic heart valve: Secondary | ICD-10-CM

## 2023-01-02 ENCOUNTER — Ambulatory Visit (HOSPITAL_BASED_OUTPATIENT_CLINIC_OR_DEPARTMENT_OTHER)
Admission: RE | Admit: 2023-01-02 | Discharge: 2023-01-02 | Disposition: A | Discharge status: Home / Self Care | Payer: Medicare HMO | Source: Ambulatory Visit | Attending: Internal Medicine | Admitting: Internal Medicine

## 2023-01-02 ENCOUNTER — Emergency Department (HOSPITAL_COMMUNITY): Payer: Medicare HMO

## 2023-01-02 ENCOUNTER — Other Ambulatory Visit: Payer: Self-pay

## 2023-01-02 ENCOUNTER — Encounter (HOSPITAL_COMMUNITY): Payer: Self-pay

## 2023-01-02 ENCOUNTER — Emergency Department (HOSPITAL_COMMUNITY)
Admission: EM | Admit: 2023-01-02 | Discharge: 2023-01-02 | Disposition: A | Discharge status: Home / Self Care | Payer: Medicare HMO | Attending: Emergency Medicine | Admitting: Emergency Medicine

## 2023-01-02 VITALS — BP 80/60 | HR 131 | Ht 64.0 in | Wt 207.6 lb

## 2023-01-02 DIAGNOSIS — I4510 Unspecified right bundle-branch block: Secondary | ICD-10-CM | POA: Insufficient documentation

## 2023-01-02 DIAGNOSIS — Z853 Personal history of malignant neoplasm of breast: Secondary | ICD-10-CM | POA: Insufficient documentation

## 2023-01-02 DIAGNOSIS — I10 Essential (primary) hypertension: Secondary | ICD-10-CM | POA: Diagnosis not present

## 2023-01-02 DIAGNOSIS — Z950 Presence of cardiac pacemaker: Secondary | ICD-10-CM | POA: Insufficient documentation

## 2023-01-02 DIAGNOSIS — Z952 Presence of prosthetic heart valve: Secondary | ICD-10-CM | POA: Insufficient documentation

## 2023-01-02 DIAGNOSIS — Z79899 Other long term (current) drug therapy: Secondary | ICD-10-CM | POA: Insufficient documentation

## 2023-01-02 DIAGNOSIS — R42 Dizziness and giddiness: Secondary | ICD-10-CM | POA: Diagnosis not present

## 2023-01-02 DIAGNOSIS — Z7901 Long term (current) use of anticoagulants: Secondary | ICD-10-CM | POA: Insufficient documentation

## 2023-01-02 DIAGNOSIS — I4819 Other persistent atrial fibrillation: Secondary | ICD-10-CM | POA: Insufficient documentation

## 2023-01-02 DIAGNOSIS — I4891 Unspecified atrial fibrillation: Secondary | ICD-10-CM

## 2023-01-02 DIAGNOSIS — R000 Tachycardia, unspecified: Secondary | ICD-10-CM | POA: Insufficient documentation

## 2023-01-02 DIAGNOSIS — I480 Paroxysmal atrial fibrillation: Secondary | ICD-10-CM

## 2023-01-02 DIAGNOSIS — D6869 Other thrombophilia: Secondary | ICD-10-CM | POA: Insufficient documentation

## 2023-01-02 DIAGNOSIS — R9431 Abnormal electrocardiogram [ECG] [EKG]: Secondary | ICD-10-CM | POA: Insufficient documentation

## 2023-01-02 DIAGNOSIS — I700 Atherosclerosis of aorta: Secondary | ICD-10-CM | POA: Diagnosis not present

## 2023-01-02 DIAGNOSIS — E119 Type 2 diabetes mellitus without complications: Secondary | ICD-10-CM | POA: Diagnosis not present

## 2023-01-02 DIAGNOSIS — J45909 Unspecified asthma, uncomplicated: Secondary | ICD-10-CM | POA: Diagnosis not present

## 2023-01-02 LAB — CBC
HCT: 41.8 % (ref 36.0–46.0)
Hemoglobin: 13.5 g/dL (ref 12.0–15.0)
MCH: 28.2 pg (ref 26.0–34.0)
MCHC: 32.3 g/dL (ref 30.0–36.0)
MCV: 87.4 fL (ref 80.0–100.0)
Platelets: 169 10*3/uL (ref 150–400)
RBC: 4.78 MIL/uL (ref 3.87–5.11)
RDW: 14.7 % (ref 11.5–15.5)
WBC: 6.1 10*3/uL (ref 4.0–10.5)
nRBC: 0 % (ref 0.0–0.2)

## 2023-01-02 LAB — BASIC METABOLIC PANEL
Anion gap: 13 (ref 5–15)
BUN: 20 mg/dL (ref 8–23)
CO2: 26 mmol/L (ref 22–32)
Calcium: 9.3 mg/dL (ref 8.9–10.3)
Chloride: 100 mmol/L (ref 98–111)
Creatinine, Ser: 0.9 mg/dL (ref 0.44–1.00)
GFR, Estimated: >60 mL/min (ref >60)
Glucose, Bld: 128 mg/dL — ABNORMAL HIGH (ref 70–99)
Potassium: 4 mmol/L (ref 3.5–5.1)
Sodium: 139 mmol/L (ref 135–145)

## 2023-01-02 LAB — TROPONIN I (HIGH SENSITIVITY): Troponin I (High Sensitivity): 17 ng/L (ref <18)

## 2023-01-02 LAB — MAGNESIUM: Magnesium: 2.1 mg/dL (ref 1.7–2.4)

## 2023-01-02 MED ORDER — SODIUM CHLORIDE 0.9 % IV BOLUS: 1000.0000 mL | Freq: Once | INTRAVENOUS | Status: DC

## 2023-01-02 MED ORDER — SODIUM CHLORIDE 0.9 % IV BOLUS
500.0000 mL | Freq: Once | INTRAVENOUS | Status: AC
  Administered 2023-01-02: 500 mL via INTRAVENOUS

## 2023-01-02 NOTE — Discharge Instructions (Signed)
You were seen in the emergency room today for A-fib.  Labs and imaging came back reassuring.  Please call cardiology and schedule follow-up appointment regarding today's visit.  Continue your current medication regimen and do not miss a dose of the Eliquis.  Return to the emergency room with any new or worsening symptoms.

## 2023-01-02 NOTE — Progress Notes (Addendum)
Primary Care Physician: Joycelyn Rua, MD Primary Cardiologist: Tessa Lerner, DO Electrophysiologist: Dr. Lalla Brothers    Referring Physician: Dr. Lanae Boast is a 77 y.o. female with a history of TAVR on 12/02/22, moderate atherosclerotic disease and 3-vessel coronary artery calcifications by imaging, former tobacco use, complete heart block after TAVR s/p PPM on 12/04/22, HTN, history of VT, GERD, T2DM, history of breast cancer, and paroxysmal atrial fibrillation who presents for consultation in the [facility]. Wound check appt on 9/5 and device reviewed by Dr. Lalla Brothers showing new onset Afib with plan to stop ASA and begin Eliquis 5 mg BID. Patient is on Eliquis for a CHADS2VASC score of 6.  On evaluation today, she is currently in Afib with RVR. She states she woke up this morning with HR 135. She feels near syncopal. She has not had anything to eat. She took her morning medications with water. She has been on Eliquis for ~2 weeks now.   Today, she denies symptoms of chest pain, shortness of breath, orthopnea, PND, lower extremity edema, dizziness, presyncope, syncope, snoring, daytime somnolence, bleeding, or neurologic sequela. The patient is tolerating medications without difficulties and is otherwise without complaint today.   she has a BMI of Body mass index is 35.63 kg/m.Marland Kitchen Filed Weights   01/02/23 1018  Weight: 94.2 kg    Current Outpatient Medications  Medication Sig Dispense Refill   albuterol (VENTOLIN HFA) 108 (90 Base) MCG/ACT inhaler Inhale 2 puffs into the lungs every 4 (four) hours as needed for wheezing or shortness of breath.     apixaban (ELIQUIS) 5 MG TABS tablet Take 1 tablet (5 mg total) by mouth 2 (two) times daily. 60 tablet 6   B Complex Vitamins (B COMPLEX PO) Take 1 capsule by mouth daily.     Cholecalciferol (VITAMIN D3) 125 MCG (5000 UT) TABS Take 5,000 Units by mouth daily.     lisinopril-hydrochlorothiazide  (ZESTORETIC) 20-12.5 MG tablet Take 1 tablet by mouth daily.     Multiple Vitamin (MULTIVITAMINS PO) Take 1 tablet by mouth daily.     naproxen sodium (ALEVE) 220 MG tablet Take 220 mg by mouth daily.     OZEMPIC, 0.25 OR 0.5 MG/DOSE, 2 MG/3ML SOPN Inject 0.5 mg into the skin every Saturday.     pantoprazole (PROTONIX) 40 MG tablet Take 40 mg by mouth daily.     polyvinyl alcohol (LIQUIFILM TEARS) 1.4 % ophthalmic solution Place 1 drop into both eyes as needed for dry eyes.     potassium chloride SA (KLOR-CON M) 10 MEQ tablet Take 2 tablets (20 mEq total) by mouth 2 (two) times daily. 30 tablet 6   solifenacin (VESICARE) 5 MG tablet Take 5 mg by mouth daily.     torsemide (DEMADEX) 10 MG tablet Take 1 tablet (10 mg total) by mouth every morning. 90 tablet 3   No current facility-administered medications for this encounter.    Atrial Fibrillation Management history:  Previous antiarrhythmic drugs: None Previous cardioversions: None Previous ablations: None Anticoagulation history: Eliquis   ROS- All systems are reviewed and negative except as per the HPI above.  Physical Exam: BP (!) 80/60   Pulse (!) 131   Ht 5\' 4"  (1.626 m)   Wt 94.2 kg   BMI 35.63 kg/m   GEN: Well nourished, well developed in no acute distress NECK: No JVD; No carotid bruits CARDIAC: Irregularly irregular tachycardic rate and rhythm, no murmurs, rubs, gallops RESPIRATORY:  Clear to auscultation without rales, wheezing or rhonchi  ABDOMEN: Soft, non-tender, non-distended EXTREMITIES:  No edema; No deformity   EKG today demonstrates  Vent. rate 131 BPM PR interval * ms QRS duration 124 ms QT/QTcB 334/493 ms P-R-T axes * 133 -35 Wide QRS tachycardia Right bundle branch block Anterolateral infarct , age undetermined Marked T wave abnormality, consider inferior ischemia Abnormal ECG When compared with ECG of 05-Dec-2022 07:30, PREVIOUS ECG IS PRESENT  Echo 12/03/22 demonstrated   1. The aortic valve  has been repaired/replaced. Aortic valve  regurgitation is not visualized. There is a 26 mm Sapien prosthetic (TAVR)  valve present in the aortic position. Procedure Date: 12/02/2022. Echo  findings are consistent with normal structure  and function of the aortic valve prosthesis. Aortic valve area, by VTI  measures 3.17 cm. Aortic valve mean gradient measures 4.0 mmHg. Aortic  valve Vmax measures 1.22 m/s.   2. Left ventricular ejection fraction, by estimation, is 60 to 65%. Left  ventricular ejection fraction by 2D MOD biplane is 61.6 %. The left  ventricle has normal function. The left ventricle has no regional wall  motion abnormalities. There is moderate  asymmetric left ventricular hypertrophy of the basal-septal segment.  Indeterminate diastolic filling due to E-A fusion.   3. Right ventricular systolic function is normal. The right ventricular  size is normal. Tricuspid regurgitation signal is inadequate for assessing  PA pressure.   4. The mitral valve is grossly normal. Trivial mitral valve  regurgitation. No evidence of mitral stenosis.   5. The inferior vena cava is dilated in size with >50% respiratory  variability, suggesting right atrial pressure of 8 mmHg.   ASSESSMENT & PLAN CHA2DS2-VASc Score = 6  The patient's score is based upon: CHF History: 0 HTN History: 1 Diabetes History: 1 Stroke History: 0 Vascular Disease History: 1 Age Score: 2 Gender Score: 1       ASSESSMENT AND PLAN: Persistent Atrial Fibrillation (ICD10:  I48.19) The patient's CHA2DS2-VASc score is 6, indicating a 9.7% annual risk of stroke.    Patient is currently in Afib with RVR. Patient is tachycardic, has hypotension, and feels near syncopal. Due to these findings, after discussion with patient and husband she agrees to proceed to ED for urgent medical attention. She took Ozempic injection last Saturday 9/14.   She states she took her morning medications with water and has not eaten  anything today.  Going forward, rhythm control options include AAD or ablation. AAD options could include Multaq, Tikosyn, or amiodarone. We maybe can consider Tikosyn but would have to discuss with EP. Qtc corrected is 493 ms. CrCl estimate 103 mL/min. She would have to transition from hydrochlorothiazide. Vesicare shows class C interaction with Tikosyn would have to clear with pharmacy.   Secondary Hypercoagulable State (ICD10:  D68.69) The patient is at significant risk for stroke/thromboembolism based upon her CHA2DS2-VASc Score of 6.  Continue Apixaban (Eliquis).  She has been on Eliquis for ~2 weeks now.  Repeat CBC at structural heart office visit for new Eliquis start.    Patient will be transported to ED via wheelchair.    Lake Bells, PA-C  Afib Clinic [facility] [address] [place], [place] [address] [phone]

## 2023-01-02 NOTE — ED Provider Notes (Signed)
EMERGENCY DEPARTMENT AT [facility] Provider Note   CSN: [id number] Arrival date & time: 01/02/23  1052     History {Add pertinent medical, surgical, social history, OB history to HPI:1} Chief Complaint  Patient presents with   Dizziness   Atrial Fibrillation    [name] is a 77 y.o. female with past medical history of breast cancer, aortic valve stenosis, TAVR, pacemaker, diabetes, GERD, hypertension, asthma presenting today with reported hypotension and A-fib RVR.  She reported a low blood pressure this morning and had called A-fib clinic.  On arrival to A-fib clinic they recommend she come to the emergency room.  Patient reports she has had some exertional shortness of breath throughout the past week as well as some worsening general fatigue especially today.  She has some dizziness as well when for BP was low. Symptoms are not focal.  Denies chest pains, palpitations, HA.  Patient reports taking Eliquis for 2 weeks. No smoking, ETOH, or drugs.    Dizziness Atrial Fibrillation       Home Medications Prior to Admission medications   Medication Sig Start Date End Date Taking? Authorizing Provider  albuterol (VENTOLIN HFA) 108 (90 Base) MCG/ACT inhaler Inhale 2 puffs into the lungs every 4 (four) hours as needed for wheezing or shortness of breath. 04/13/18   [provider]  apixaban (ELIQUIS) 5 MG TABS tablet Take 1 tablet (5 mg total) by mouth 2 (two) times daily. 12/18/22   Lanier Prude, MD  B Complex Vitamins (B COMPLEX PO) Take 1 capsule by mouth daily.    [provider]  Cholecalciferol (VITAMIN D3) 125 MCG (5000 UT) TABS Take 5,000 Units by mouth daily.    [provider]  lisinopril-hydrochlorothiazide (ZESTORETIC) 20-12.5 MG tablet Take 1 tablet by mouth daily.    [provider]  Multiple Vitamin (MULTIVITAMINS PO) Take 1 tablet by mouth daily.    [provider]  naproxen sodium (ALEVE) 220 MG  tablet Take 220 mg by mouth daily.    [provider]  OZEMPIC, 0.25 OR 0.5 MG/DOSE, 2 MG/3ML SOPN Inject 0.5 mg into the skin every Saturday.    [provider]  pantoprazole (PROTONIX) 40 MG tablet Take 40 mg by mouth daily.    [provider]  polyvinyl alcohol (LIQUIFILM TEARS) 1.4 % ophthalmic solution Place 1 drop into both eyes as needed for dry eyes.    [provider]  potassium chloride SA (KLOR-CON M) 10 MEQ tablet Take 2 tablets (20 mEq total) by mouth 2 (two) times daily. 12/03/22   Janetta Hora, PA-C  solifenacin (VESICARE) 5 MG tablet Take 5 mg by mouth daily. 11/06/22   [provider]  torsemide (DEMADEX) 10 MG tablet Take 1 tablet (10 mg total) by mouth every morning. 11/18/22   Tolia, Sunit, DO      Allergies    Penicillin g, Penicillins, Bee venom, and Morphine    Review of Systems   Review of Systems  Neurological:  Positive for dizziness.    Physical Exam Updated Vital Signs BP 104/75 (BP Location: Left Leg)   Pulse (!) 53   Temp 97.7 F (36.5 C) (Oral)   Resp 18   Ht 5\' 4"  (1.626 m)   Wt 93.9 kg   SpO2 96%   BMI 35.53 kg/m  Physical Exam Vitals and nursing note reviewed.  Constitutional:      General: She is not in acute distress.    Appearance:  She is not ill-appearing or toxic-appearing.  HENT:     Head: Normocephalic and atraumatic.  Eyes:     General: No scleral icterus.    Conjunctiva/sclera: Conjunctivae normal.  Cardiovascular:     Rate and Rhythm: Normal rate. Rhythm irregular.     Pulses: Normal pulses.     Heart sounds: Normal heart sounds.  Pulmonary:     Effort: Pulmonary effort is normal. No respiratory distress.     Breath sounds: Normal breath sounds. No stridor. No wheezing or rales.  Abdominal:     General: Abdomen is flat. Bowel sounds are normal. There is no distension.     Palpations: Abdomen is soft. There is no mass.     Tenderness: There is no abdominal tenderness.   Musculoskeletal:        General: No swelling or tenderness.     Right lower leg: No edema.     Left lower leg: No edema.  Skin:    General: Skin is warm and dry.     Capillary Refill: Capillary refill takes less than 2 seconds.     Findings: No lesion.  Neurological:     General: No focal deficit present.     Mental Status: She is alert and oriented to person, place, and time. Mental status is at baseline.     ED Results / Procedures / Treatments   Labs (all labs ordered are listed, but only abnormal results are displayed) Labs Reviewed  BASIC METABOLIC PANEL - Abnormal; Notable for the following components:      Result Value   Glucose, Bld 128 (*)    All other components within normal limits  CBC  MAGNESIUM  BRAIN NATRIURETIC PEPTIDE  TROPONIN I (HIGH SENSITIVITY)  TROPONIN I (HIGH SENSITIVITY)    EKG EKG Interpretation Date/Time:  Friday January 02 2023 11:25:28 EDT Ventricular Rate:  111 PR Interval:  208 QRS Duration:  130 QT Interval:  402 QTC Calculation: 546 R Axis:   146  Text Interpretation: Atrial-sensed ventricular-paced rhythm Abnormal ECG irregular, afib? Confirmed by Pricilla Loveless [phone]) on 01/02/2023 1:33:29 PM  Radiology DG Chest 1 View  Result Date: 01/02/2023 CLINICAL DATA:  5107 Atrial fibrillation (HCC) 5107. EXAM: CHEST  1 VIEW COMPARISON:  12/05/2022. FINDINGS: Redemonstration of calcification overlying the right lower lung zone. Bilateral lung fields are otherwise clear. Bilateral lateral costophrenic angles are clear. Normal cardio-mediastinal silhouette. Aortic arch calcifications noted. There is a left sided 2-lead pacemaker.  Prosthetic aortic valve. No acute osseous abnormalities. The soft tissues are within normal limits. IMPRESSION: No active disease. Electronically Signed   By: Jules Schick M.D.   On: 01/02/2023 13:16    Procedures Procedures  {Document cardiac monitor, telemetry assessment procedure when  appropriate:1}  Medications Ordered in ED Medications - No data to display  ED Course/ Medical Decision Making/ A&P   {   Click here for ABCD2, HEART and other calculatorsREFRESH Note before signing :1}                              Medical Decision Making Amount and/or Complexity of Data Reviewed Labs: ordered.   JANILL FUSCO 77 y.o. presented today for paroxysmal A-fib.. Working DDx that I considered at this time includes, but not limited to, CVA/TIA, arrhythmia, vertigo, medication s/e, orthostatic hypotension, electrolyte abnormalities, dehydration, URI, ACS, UTI, anemia   R/o DDx: These are considered less likely than current impression due to history of  present illness, physical exam, lab/imaging findings   Review of prior external notes: ***  Pmhx:   Unique Tests and My Interpretation:  CBC with differential: *** CMP: *** POC BG: *** Trop: *** BNP: ***   EKG: Rate, rhythm, axis, intervals all examined: *** Orthostatic vitals***  Imaging:  CXR***   Problem List / ED Course / Critical interventions / Medication management  *** I ordered fluids for Hypotension and tachycardia. Upon repeat vitals and EKG patient was no longer hypotensive and patient in sinus tachycarida.  Reevaluation of the patient after these medicines showed that the patient improved Patients vitals assessed. Upon arrival patient is hemodynamically stable.  I have reviewed the patients home medicines and have made adjustments as needed  Consult: Cardiology came and saw patient.  They are okay with her going home and no medication changes.  Plan: Call A-fib clinic to schedule appointment for follow-up on today's visit.    {Document critical care time when appropriate:1} {Document review of labs and clinical decision tools ie heart score, Chads2Vasc2 etc:1}  {Document your independent review of radiology images, and any outside records:1} {Document your discussion with family members,  caretakers, and with consultants:1} {Document social determinants of health affecting pt's care:1} {Document your decision making why or why not admission, treatments were needed:1} Final Clinical Impression(s) / ED Diagnoses Final diagnoses:  None    Rx / DC Orders ED Discharge Orders     None

## 2023-01-02 NOTE — ED Triage Notes (Addendum)
Patient reports having dizziness this morning and palpitations.  Went to Afib clinic and sent here for further evaluation. Afib in low 100s  No hx of afib and not on blood thinners.

## 2023-01-06 ENCOUNTER — Other Ambulatory Visit: Payer: Self-pay

## 2023-01-06 ENCOUNTER — Emergency Department (HOSPITAL_COMMUNITY): Payer: Medicare HMO

## 2023-01-06 ENCOUNTER — Emergency Department (HOSPITAL_COMMUNITY)
Admission: EM | Admit: 2023-01-06 | Discharge: 2023-01-06 | Disposition: A | Discharge status: Home / Self Care | Payer: Medicare HMO | Attending: Emergency Medicine | Admitting: Emergency Medicine

## 2023-01-06 ENCOUNTER — Encounter (HOSPITAL_COMMUNITY): Payer: Self-pay

## 2023-01-06 ENCOUNTER — Telehealth: Payer: Self-pay | Admitting: Cardiology

## 2023-01-06 DIAGNOSIS — J45909 Unspecified asthma, uncomplicated: Secondary | ICD-10-CM | POA: Diagnosis not present

## 2023-01-06 DIAGNOSIS — R079 Chest pain, unspecified: Secondary | ICD-10-CM | POA: Diagnosis not present

## 2023-01-06 DIAGNOSIS — R0602 Shortness of breath: Secondary | ICD-10-CM | POA: Insufficient documentation

## 2023-01-06 DIAGNOSIS — Z853 Personal history of malignant neoplasm of breast: Secondary | ICD-10-CM | POA: Diagnosis not present

## 2023-01-06 DIAGNOSIS — E119 Type 2 diabetes mellitus without complications: Secondary | ICD-10-CM | POA: Insufficient documentation

## 2023-01-06 DIAGNOSIS — I10 Essential (primary) hypertension: Secondary | ICD-10-CM | POA: Insufficient documentation

## 2023-01-06 DIAGNOSIS — R002 Palpitations: Secondary | ICD-10-CM | POA: Diagnosis not present

## 2023-01-06 DIAGNOSIS — Z950 Presence of cardiac pacemaker: Secondary | ICD-10-CM | POA: Diagnosis not present

## 2023-01-06 LAB — CBC
HCT: 40.3 % (ref 36.0–46.0)
Hemoglobin: 13.2 g/dL (ref 12.0–15.0)
MCH: 29.3 pg (ref 26.0–34.0)
MCHC: 32.8 g/dL (ref 30.0–36.0)
MCV: 89.4 fL (ref 80.0–100.0)
Platelets: 178 10*3/uL (ref 150–400)
RBC: 4.51 MIL/uL (ref 3.87–5.11)
RDW: 14.7 % (ref 11.5–15.5)
WBC: 5.8 10*3/uL (ref 4.0–10.5)
nRBC: 0 % (ref 0.0–0.2)

## 2023-01-06 LAB — BASIC METABOLIC PANEL
Anion gap: 8 (ref 5–15)
BUN: 19 mg/dL (ref 8–23)
CO2: 28 mmol/L (ref 22–32)
Calcium: 9.1 mg/dL (ref 8.9–10.3)
Chloride: 102 mmol/L (ref 98–111)
Creatinine, Ser: 0.96 mg/dL (ref 0.44–1.00)
GFR, Estimated: >60 mL/min (ref >60)
Glucose, Bld: 128 mg/dL — ABNORMAL HIGH (ref 70–99)
Potassium: 4.1 mmol/L (ref 3.5–5.1)
Sodium: 138 mmol/L (ref 135–145)

## 2023-01-06 LAB — TROPONIN I (HIGH SENSITIVITY)
Troponin I (High Sensitivity): 17 ng/L (ref <18)
Troponin I (High Sensitivity): 14 ng/L (ref <18)

## 2023-01-06 LAB — MAGNESIUM: Magnesium: 2.2 mg/dL (ref 1.7–2.4)

## 2023-01-06 NOTE — Telephone Encounter (Addendum)
Spoke to pt and wife. Wife woke up in afib this morning, sweaty, light headed, wobbly and sob. BP 88/67, 101/68 HRs 125, 124 Most recent numbers within last hour: 104/62, HR 131. Pt has NOT taken her Zestoretic. Discussed w/ Dr. Anne Fu.  Pt advised to go to ED for eval and possible amiodarone start. Explained/educated the difficulty in controlling her afib outside of the hospital d/t hypotension. Pt understands. Cardmaster notified.

## 2023-01-06 NOTE — Telephone Encounter (Signed)
A;lsdkfj;lkj

## 2023-01-06 NOTE — ED Triage Notes (Signed)
Pt states bp 88/65 and hr 130 upon waking today at 0800. Pt c/o heart palpitations and diaphretic and SOB upon waking.

## 2023-01-06 NOTE — Telephone Encounter (Signed)
Patient c/o Palpitations:  STAT if patient reporting lightheadedness, shortness of breath, or chest pain  How long have you had palpitations/irregular HR/ Afib? Are you having the symptoms now? AFIB   Are you currently experiencing lightheadedness, SOB or CP? SOB   Do you have a history of afib (atrial fibrillation) or irregular heart rhythm? No   Have you checked your BP or HR? (document readings if available): 101/68 hr 124, 88/67 hr125   Are you experiencing any other symptoms?  No

## 2023-01-06 NOTE — Discharge Instructions (Signed)
Please follow with your PCP and Cardiology doctors. You were not in A fib or other bad heart rhythm this morning. I have discussed this with your heart doctors who reviewed your pacemaker data and are ok with you going home. Please follow up as scheduled and return with any new or worsening symptoms.

## 2023-01-06 NOTE — ED Provider Notes (Signed)
Emergency Department Provider Note   I have reviewed the triage vital signs and the nursing notes.   HISTORY  Chief Complaint Irregular Heart Beat   HPI [name] is a 77 y.o. female with past history reviewed below presents the emergency department with acute onset palpitations with chest discomfort, low blood pressures, shortness of breath.  Symptoms began suddenly this morning.  She tells me that she felt like she was in A-fib.  She does have a pacemaker but not a defibrillator and follows with cardiology regularly for this issue.  She has been compliant with her home medications.  Blood pressures seem to improve while on the phone with cardiology into the low 100 range but at home was as low as into the 70s and 80s.  Since arrival in the emergency department, she is feeling occasional palpitation like discomfort but no severe pressure-like pain or sharp discomfort.  Past Medical History:  Diagnosis Date   Allergic rhinitis    Asthma    Childhood per patient   Breast cancer, left (HCC) 2007   ductal carcinoma in situ, underwent left mastectomy 07/2005; reconstructive surgery with expander, permanent implant placed 03/2006, hospitalized January 2008 with breast cellulitis.   Breast cancer, right (HCC) 1998   1998; modified radical right mastectomy 1998 for infiltrating ductal adenocarcinoma with 7 of 12 axillary nodes positive. Underwent adjuvant chemotherapy and bone marrow transplant by Dr Greggory Stallion as well as radiation therapy. Also had Tram flap reconstruction.   Diabetes mellitus without complication (HCC)    GERD (gastroesophageal reflux disease)    Hiatal hernia    History of ventricular tachycardia    Hypertension    Vitamin D deficiency     Review of Systems  Constitutional: No fever/chills Cardiovascular: Positive chest pain and palpitations.  Respiratory: Positive shortness of breath. Gastrointestinal: No abdominal pain.  No nausea, no vomiting.  No diarrhea.  No  constipation. Genitourinary: Negative for dysuria. Musculoskeletal: Negative for back pain. Skin: Negative for rash. Neurological: Negative for headaches.  ____________________________________________   PHYSICAL EXAM:  VITAL SIGNS: ED Triage Vitals  Encounter Vitals Group     BP 01/06/23 1338 (!) 122/54     Pulse Rate 01/06/23 1338 79     Resp 01/06/23 1338 16     Temp 01/06/23 1338 98.5 F (36.9 C)     Temp src --      SpO2 01/06/23 1338 96 %     Weight 01/06/23 1340 207 lb 0.2 oz (93.9 kg)     Height 01/06/23 1340 5\' 4"  (1.626 m)   Constitutional: Alert and oriented. Well appearing and in no acute distress. Eyes: Conjunctivae are normal. Head: Atraumatic. Nose: No congestion/rhinnorhea. Mouth/Throat: Mucous membranes are moist.  Neck: No stridor.   Cardiovascular: Normal rate, regular rhythm. Good peripheral circulation. Grossly normal heart sounds.   Respiratory: Normal respiratory effort.  No retractions. Lungs CTAB. Gastrointestinal: Soft and nontender. No distention.  Musculoskeletal: No lower extremity tenderness nor edema. No gross deformities of extremities. Neurologic:  Normal speech and language. No gross focal neurologic deficits are appreciated.  Skin:  Skin is warm, dry and intact. No rash noted.  ____________________________________________   LABS (all labs ordered are listed, but only abnormal results are displayed)  Labs Reviewed  BASIC METABOLIC PANEL - Abnormal; Notable for the following components:      Result Value   Glucose, Bld 128 (*)    All other components within normal limits  CBC  MAGNESIUM  TROPONIN I (HIGH  SENSITIVITY)  TROPONIN I (HIGH SENSITIVITY)   ____________________________________________  EKG  Sinus rhythm with occasional A sensed V paced rhythm. No STEMI.  ____________________________________________  RADIOLOGY  DG Chest 2 View  Result Date: 01/06/2023 CLINICAL DATA:  Chest pain.  Palpitations.  Shortness of breath.  EXAM: CHEST - 2 VIEW COMPARISON:  01/02/2023. FINDINGS: Low lung volume. There is mild prominence of interstitial markings, likely accentuated by low lung volume. No frank pulmonary edema. Bilateral lung fields are otherwise clear. No dense consolidation or major lung collapse. Redemonstration of round calcification overlying the right lower anterior chest wall. Bilateral costophrenic angles are clear. Stable cardio-mediastinal silhouette. There is a left sided 2-lead pacemaker. Prosthetic aortic valve seen. No acute osseous abnormalities. The soft tissues are within normal limits. Redemonstration of multiple surgical staples along the right lateral chest wall and right upper abdomen. IMPRESSION: No acute cardiopulmonary process. Electronically Signed   By: Jules Schick M.D.   On: 01/06/2023 14:28    ____________________________________________   PROCEDURES  Procedure(s) performed:   Procedures  None  ____________________________________________   INITIAL IMPRESSION / ASSESSMENT AND PLAN / ED COURSE  Pertinent labs & imaging results that were available during my care of the patient were reviewed by me and considered in my medical decision making (see chart for details).   This patient is Presenting for Evaluation of CP, which does require a range of treatment options, and is a complaint that involves a high risk of morbidity and mortality.  The Differential Diagnoses includes but is not exclusive to acute coronary syndrome, aortic dissection, pulmonary embolism, cardiac tamponade, community-acquired pneumonia, pericarditis, musculoskeletal chest wall pain, etc.  I did obtain Additional Historical Information from husband.   I decided to review pertinent External Data, and in summary Cardiology phone notes from today reviewed.   Clinical Laboratory Tests Ordered, included troponin negative. No AKI. No electrolyte disturbance.   Radiologic Tests Ordered, included CXR. I independently  interpreted the images and agree with radiology interpretation.   Cardiac Monitor Tracing which shows NSR.    Social Determinants of Health Risk patient is not an active smoker.   Consult complete with Cardiology. Electrophysiology team has reviewed the pacemaker strip from today. No A fib or other arrhythmia. Labs are reassuring. She is stable for discharge from a Cardiology perspective.   Medical Decision Making: Summary:  Presents emergency department with lightheadedness, low blood pressure, palpitations this morning.  She has a pacemaker and thinks that she may have gone back into A-fib.  She is compliant with her medications.  She reached out to her cardiology team on-call and was advised to present to the emergency department for evaluation.  Reevaluation with update and discussion with patient and husband at bedside.  Her labs here are reassuring.  I spoke with Trish with the Cardiology team who called back after the electrophysiologist reviewed her pacemaker transmission.  No A-fib or other acute abnormality requiring change of medication or admission.  She will continue her home medicines and follow-up with cardiology as an outpatient.  Discussed tricked ED return precaution.  Considered admission but after discussion with Cardiology will d/c home.   Patient's presentation is most consistent with acute presentation with potential threat to life or bodily function.   Disposition: discharge  ____________________________________________  FINAL CLINICAL IMPRESSION(S) / ED DIAGNOSES  Final diagnoses:  Palpitations    Note:  This document was prepared using Dragon voice recognition software and may include unintentional dictation errors.  Alona Bene, MD, [facility] Emergency Medicine  Maia Plan, MD 01/09/23 [phone]

## 2023-01-09 NOTE — Progress Notes (Unsigned)
Severe AS: s/p successful TAVR with a 26 mm Edwards Sapien 3 Ultra Resilia THV via the TF approach on 12/02/22. Post operative echo with stable 26mm S3UR valve with a mean gradient at . Groin sites remain stable. Reports she had not been taking ASA 81mg  daily therefore instructed to start this today. No need for dental SBE as she is edentulous. Plan one month follow up with repeat echo.    Chronic diastolic CHF: Volume stable today on exam with NYHA class Reviewed proper  management with daily weights. They are to buy scales today as well otherwise continue current Lisinopril-HCT and torsemide dosing.    CHB s/p PPM: Noted to be high risk for post TAVR arrhthymias therefore ZIO placed at discharge. Began having pauses with progression to CHB and is now s/p PPM MDT placement. Sees EP for wound check 9/5. Doing well with stable site. Understands arm movement restrictions. H   HTN: Stable with no changes to therapy today.    DMT2: Follow with PCP. Continue current regimen     Morbid obesity: Body mass index is 35.65 kg/m. Resume Ozempic.   Elevated Raise Score: with underlying RBBB and severe AS, she is at increased risk for cardiac amyloid. Discussed today and will place order for PYP scan.     Studies Reviewed: .   Cardiac Studies & Procedures   CARDIAC CATHETERIZATION  CARDIAC CATHETERIZATION 12/04/2022  Narrative 1.  Successful right IJ temporary pacemaker with a threshold of 0.3 mA, output of 5 mA, and backup rate of 50 bpm.  Recommendation: Management per EP service.   CARDIAC CATHETERIZATION  CARDIAC CATHETERIZATION 10/14/2022  Narrative Images from the original result were not included.    LV end diastolic pressure is mildly elevated.   No indication for antiplatelet therapy at this time .  LM: Normal. No significant disease LAD: Normal. No significant disease Lcx: Dominant. No significant disease RCA: Nondominant. No significant disease  RA: 8 mmHg RV: 49/6 mmHg PA:46/25 mmHg, mPAP 31 mmHg PCW: 17 mmHg  CO: 5.3 L.min CI: 2.6 L/min/m2  Simultaneous LV-AO measurement using Langston cathteer Aortic valve mean PG 25 mmHg, AVA 0.94 cm2  Conclusion: No significant coronary artery disease Mildly elevated filling pressures Mild PH, WHO Grp II Aortic stenosis, moderate by gradients, severe by area. In the correct context, aortic stenosis could possibly be paradoxically low flow low gradient. Alternatively, dyspnea could be due to  mild PH, possible diastolic dysfunction. Recommend clinical correlation.   Elder Negus, MD Pager: [phone] Office: [phone]  Findings Coronary Findings Diagnostic  Dominance: Left  Left Main Vessel is normal in caliber. Vessel is angiographically normal.  Left Anterior Descending Vessel is normal in caliber. Vessel is angiographically normal.  Left Circumflex Vessel is normal in caliber. Vessel is angiographically normal.  Right Coronary Artery Vessel is small. Vessel is angiographically normal.  Intervention  No interventions have been documented.     ECHOCARDIOGRAM  ECHOCARDIOGRAM COMPLETE 12/03/2022  Narrative ECHOCARDIOGRAM REPORT    Patient Name:   [name] Date of Exam: 12/03/2022 Medical Rec #:  [id number]     Height:       64.0 in Accession #:    [id number]    Weight:       207.7 lb Date of Birth:  [birth year]     BSA:          1.988 m Patient Age:    77 years      BP:  [facility]   MULTIDISCIPLINARY HEART VALVE TEAM  Structural Heart Office Note:  .   Date:  01/09/2023  ID:  [name], DOB 1946/01/15, MRN [id number] PCP: Joycelyn Rua, MD  [facility] Providers Cardiologist:  Tessa Lerner, DO { Click to update primary MD,subspecialty MD or APP then REFRESH:1}    History of Present Illness: .   KIYLEE [name] is a 77 y.o. female  with a hx of breast cancer, morbid obesity, former tobacco abuse, ventricular tachycardia, DMT2, HTN, RBBB/LAFB and severe LFLG aortic stenosis who presented to [facility] on 12/02/22 for planned TAVR and is being seen today for TOC follow up.     [name] has been followed by Dr. Odis Hollingshead for her cardiology care. She had an echo in 08/2022, but it was not possible to determine the severity of her aortic stenosis due to poor windows related to her mastectomies and reconstructions. TEE on 10/14/2022 which showed EF 55%, and severe AS with mean grad 31 mmHg, AVA 0.81 cm2, SVI 32 and mild-mod MR/TR. [facility] 10/14/22 showed no significant coronary artery disease. The aortic valve simultaneous mean gradient was 25 mmHg with a valve area of 0.94 cm. There is mild pulmonary hypertension at 46/25 with a mean of 31. Pulmonary capillary wedge pressure was 17. She has reported progressive exertional shortness of breath fatigue and generalized weakness since March 2024    She was then evaluated by the multidisciplinary valve team and felt to have severe, symptomatic aortic stenosis and to be a suitable candidate. She is now s/p successful TAVR with a 26 mm Edwards Sapien 3 Ultra Resilia THV via the TF approach on 12/02/22. Post operative echo showed stable valve function and gradients with a 26mm S3UR with an AVA at 3.17cm2 and mean gradient at . She was discharged 12/03/22 with ZIO monitor due to underlying conduction disease.    Unfortunately our on-call provider was contacted 8/22 by [facility] regarding critical EKG with evidence  of a 4.3 second pause and a 10.8 second pausing. This progressed to CHB with rates in the 20-40 range. On call, she was noted to be sleeping but informed that she would need to return to the hospital for further evaluation. She was taken for urgent temporary pacemaker placement with ultimate permanent PPM placement with MDT dual chamber with Dr. Lalla Brothers 12/04/22. Post placement CXR with no pneumothorax.    She is here today with her husband and reports that she has been very well since discharge. PPM site has been stable with no redness or overt pain. She has been following arm movement restrictions. Groin sites look great with no bleeding or hematoma. She reports that she has not been taking ASA 81mg  daily therefore this reiterated that she needs to be on this daily. They will pick that up on the way home and start today. Her breathing has improved and she denies chest pain, SOB, palpitations, LE edema, orthopnea, PND, dizziness, or syncope. No bleeding in stool or urine. She has been ambulating more and can tell she has improved stamina. She has follow with EP for post PPM wound check 9/5.    Patient seen in the ED 9/20 with th AF clinic after device shoowed new onset AF. ASA transitioned to Eliquis. Seen in ED with orthostatic hypotension although discharged. Returned once again 01/06/23 with palpitations. EP team reviewed PPM tracings that showed no AF or other arrhthymias. Patient was discharged home.   Today she is here  cm AV Area (Vmean):   3.22 cm AV Area (VTI):     3.17 cm AV Vmax:           122.25 cm/s AV Vmean:          83.725 cm/s AV VTI:            0.200 m AV Peak Grad:      6.0 mmHg AV Mean Grad:      4.0 mmHg LVOT Vmax:         104.00 cm/s LVOT Vmean:        64.900 cm/s LVOT VTI:          0.153 m LVOT/AV VTI ratio: 0.76  AORTA Ao Root diam: 3.40 cm Ao Asc diam:  3.20 cm  MITRAL VALVE MV Area (PHT): 5.38 cm    SHUNTS MV Decel Time: 141 msec    Systemic VTI:  0.15 m MV E velocity: 96.90 cm/s  Systemic Diam: 2.30 cm  Lennie Odor MD Electronically signed by Lennie Odor MD Signature Date/Time: 12/03/2022/11:34:32 AM    Final   TEE  ECHO TEE 10/14/2022  Narrative TRANSESOPHOGEAL ECHO REPORT    Patient Name:   [name] Date of Exam: 10/14/2022 Medical Rec #:  [id number]     Height:       64.0 in Accession #:    [id number]    Weight:       218.0 lb Date of Birth:  [birth year]     BSA:          2.029 m Patient Age:    77 years      BP:           145/91 mmHg Patient Gender: F             HR:           78 bpm. Exam Location:  Inpatient  Procedure: Transesophageal Echo, Cardiac Doppler and Color Doppler  Indications:    Nonrheumatic aortic valve stenosis I35  History:        Patient has prior history of Echocardiogram examinations, most recent  09/13/2022. Aortic Valve Disease.  Sonographer:    Dondra Prader RVT RCS Referring Phys: [id number] [facility] J PATWARDHAN  PROCEDURE: After discussion of the risks and benefits of a TEE, an informed consent was obtained from the patient. The transesophogeal probe was passed without difficulty through the esophogus of the patient. Local oropharyngeal anesthetic was provided with viscous lidocaine. Sedation performed by different physician. The patient developed no complications during the procedure.  IMPRESSIONS   1. Left ventricular ejection fraction, by estimation, is 55 to 60%. The left ventricle has normal function. 2. Right ventricular systolic function is normal. The right ventricular size is normal. 3. No left atrial/left atrial appendage thrombus was detected. 4. The mitral valve is normal in structure. Mild to moderate mitral valve regurgitation. No evidence of mitral stenosis. 5. Tricuspid valve regurgitation is mild to moderate. 6. Aortic stenosis severe by AVA, moderate by gradients, velocity, as well as dimensionless index 0.28. Marland Kitchen The aortic valve is calcified. Aortic valve regurgitation is not visualized. Aortic valve area, by VTI measures 0.81 cm. Aortic valve mean gradient measures 31.0 mmHg. Aortic valve Vmax measures 3.57 m/s. 7. There is mild (Grade II) plaque.  FINDINGS Left Ventricle: Left ventricular ejection fraction, by estimation, is 55 to 60%. The left ventricle has normal function. The left ventricular internal cavity size was normal in size.  Right Ventricle: The right ventricular size is  [facility]   MULTIDISCIPLINARY HEART VALVE TEAM  Structural Heart Office Note:  .   Date:  01/09/2023  ID:  [name], DOB 1946/01/15, MRN [id number] PCP: Joycelyn Rua, MD  [facility] Providers Cardiologist:  Tessa Lerner, DO { Click to update primary MD,subspecialty MD or APP then REFRESH:1}    History of Present Illness: .   KIYLEE [name] is a 77 y.o. female  with a hx of breast cancer, morbid obesity, former tobacco abuse, ventricular tachycardia, DMT2, HTN, RBBB/LAFB and severe LFLG aortic stenosis who presented to [facility] on 12/02/22 for planned TAVR and is being seen today for TOC follow up.     [name] has been followed by Dr. Odis Hollingshead for her cardiology care. She had an echo in 08/2022, but it was not possible to determine the severity of her aortic stenosis due to poor windows related to her mastectomies and reconstructions. TEE on 10/14/2022 which showed EF 55%, and severe AS with mean grad 31 mmHg, AVA 0.81 cm2, SVI 32 and mild-mod MR/TR. [facility] 10/14/22 showed no significant coronary artery disease. The aortic valve simultaneous mean gradient was 25 mmHg with a valve area of 0.94 cm. There is mild pulmonary hypertension at 46/25 with a mean of 31. Pulmonary capillary wedge pressure was 17. She has reported progressive exertional shortness of breath fatigue and generalized weakness since March 2024    She was then evaluated by the multidisciplinary valve team and felt to have severe, symptomatic aortic stenosis and to be a suitable candidate. She is now s/p successful TAVR with a 26 mm Edwards Sapien 3 Ultra Resilia THV via the TF approach on 12/02/22. Post operative echo showed stable valve function and gradients with a 26mm S3UR with an AVA at 3.17cm2 and mean gradient at . She was discharged 12/03/22 with ZIO monitor due to underlying conduction disease.    Unfortunately our on-call provider was contacted 8/22 by [facility] regarding critical EKG with evidence  of a 4.3 second pause and a 10.8 second pausing. This progressed to CHB with rates in the 20-40 range. On call, she was noted to be sleeping but informed that she would need to return to the hospital for further evaluation. She was taken for urgent temporary pacemaker placement with ultimate permanent PPM placement with MDT dual chamber with Dr. Lalla Brothers 12/04/22. Post placement CXR with no pneumothorax.    She is here today with her husband and reports that she has been very well since discharge. PPM site has been stable with no redness or overt pain. She has been following arm movement restrictions. Groin sites look great with no bleeding or hematoma. She reports that she has not been taking ASA 81mg  daily therefore this reiterated that she needs to be on this daily. They will pick that up on the way home and start today. Her breathing has improved and she denies chest pain, SOB, palpitations, LE edema, orthopnea, PND, dizziness, or syncope. No bleeding in stool or urine. She has been ambulating more and can tell she has improved stamina. She has follow with EP for post PPM wound check 9/5.    Patient seen in the ED 9/20 with th AF clinic after device shoowed new onset AF. ASA transitioned to Eliquis. Seen in ED with orthostatic hypotension although discharged. Returned once again 01/06/23 with palpitations. EP team reviewed PPM tracings that showed no AF or other arrhthymias. Patient was discharged home.   Today she is here  [facility]   MULTIDISCIPLINARY HEART VALVE TEAM  Structural Heart Office Note:  .   Date:  01/09/2023  ID:  [name], DOB 1946/01/15, MRN [id number] PCP: Joycelyn Rua, MD  [facility] Providers Cardiologist:  Tessa Lerner, DO { Click to update primary MD,subspecialty MD or APP then REFRESH:1}    History of Present Illness: .   KIYLEE [name] is a 77 y.o. female  with a hx of breast cancer, morbid obesity, former tobacco abuse, ventricular tachycardia, DMT2, HTN, RBBB/LAFB and severe LFLG aortic stenosis who presented to [facility] on 12/02/22 for planned TAVR and is being seen today for TOC follow up.     [name] has been followed by Dr. Odis Hollingshead for her cardiology care. She had an echo in 08/2022, but it was not possible to determine the severity of her aortic stenosis due to poor windows related to her mastectomies and reconstructions. TEE on 10/14/2022 which showed EF 55%, and severe AS with mean grad 31 mmHg, AVA 0.81 cm2, SVI 32 and mild-mod MR/TR. [facility] 10/14/22 showed no significant coronary artery disease. The aortic valve simultaneous mean gradient was 25 mmHg with a valve area of 0.94 cm. There is mild pulmonary hypertension at 46/25 with a mean of 31. Pulmonary capillary wedge pressure was 17. She has reported progressive exertional shortness of breath fatigue and generalized weakness since March 2024    She was then evaluated by the multidisciplinary valve team and felt to have severe, symptomatic aortic stenosis and to be a suitable candidate. She is now s/p successful TAVR with a 26 mm Edwards Sapien 3 Ultra Resilia THV via the TF approach on 12/02/22. Post operative echo showed stable valve function and gradients with a 26mm S3UR with an AVA at 3.17cm2 and mean gradient at . She was discharged 12/03/22 with ZIO monitor due to underlying conduction disease.    Unfortunately our on-call provider was contacted 8/22 by [facility] regarding critical EKG with evidence  of a 4.3 second pause and a 10.8 second pausing. This progressed to CHB with rates in the 20-40 range. On call, she was noted to be sleeping but informed that she would need to return to the hospital for further evaluation. She was taken for urgent temporary pacemaker placement with ultimate permanent PPM placement with MDT dual chamber with Dr. Lalla Brothers 12/04/22. Post placement CXR with no pneumothorax.    She is here today with her husband and reports that she has been very well since discharge. PPM site has been stable with no redness or overt pain. She has been following arm movement restrictions. Groin sites look great with no bleeding or hematoma. She reports that she has not been taking ASA 81mg  daily therefore this reiterated that she needs to be on this daily. They will pick that up on the way home and start today. Her breathing has improved and she denies chest pain, SOB, palpitations, LE edema, orthopnea, PND, dizziness, or syncope. No bleeding in stool or urine. She has been ambulating more and can tell she has improved stamina. She has follow with EP for post PPM wound check 9/5.    Patient seen in the ED 9/20 with th AF clinic after device shoowed new onset AF. ASA transitioned to Eliquis. Seen in ED with orthostatic hypotension although discharged. Returned once again 01/06/23 with palpitations. EP team reviewed PPM tracings that showed no AF or other arrhthymias. Patient was discharged home.   Today she is here  [facility]   MULTIDISCIPLINARY HEART VALVE TEAM  Structural Heart Office Note:  .   Date:  01/09/2023  ID:  [name], DOB 1946/01/15, MRN [id number] PCP: Joycelyn Rua, MD  [facility] Providers Cardiologist:  Tessa Lerner, DO { Click to update primary MD,subspecialty MD or APP then REFRESH:1}    History of Present Illness: .   KIYLEE [name] is a 77 y.o. female  with a hx of breast cancer, morbid obesity, former tobacco abuse, ventricular tachycardia, DMT2, HTN, RBBB/LAFB and severe LFLG aortic stenosis who presented to [facility] on 12/02/22 for planned TAVR and is being seen today for TOC follow up.     [name] has been followed by Dr. Odis Hollingshead for her cardiology care. She had an echo in 08/2022, but it was not possible to determine the severity of her aortic stenosis due to poor windows related to her mastectomies and reconstructions. TEE on 10/14/2022 which showed EF 55%, and severe AS with mean grad 31 mmHg, AVA 0.81 cm2, SVI 32 and mild-mod MR/TR. [facility] 10/14/22 showed no significant coronary artery disease. The aortic valve simultaneous mean gradient was 25 mmHg with a valve area of 0.94 cm. There is mild pulmonary hypertension at 46/25 with a mean of 31. Pulmonary capillary wedge pressure was 17. She has reported progressive exertional shortness of breath fatigue and generalized weakness since March 2024    She was then evaluated by the multidisciplinary valve team and felt to have severe, symptomatic aortic stenosis and to be a suitable candidate. She is now s/p successful TAVR with a 26 mm Edwards Sapien 3 Ultra Resilia THV via the TF approach on 12/02/22. Post operative echo showed stable valve function and gradients with a 26mm S3UR with an AVA at 3.17cm2 and mean gradient at . She was discharged 12/03/22 with ZIO monitor due to underlying conduction disease.    Unfortunately our on-call provider was contacted 8/22 by [facility] regarding critical EKG with evidence  of a 4.3 second pause and a 10.8 second pausing. This progressed to CHB with rates in the 20-40 range. On call, she was noted to be sleeping but informed that she would need to return to the hospital for further evaluation. She was taken for urgent temporary pacemaker placement with ultimate permanent PPM placement with MDT dual chamber with Dr. Lalla Brothers 12/04/22. Post placement CXR with no pneumothorax.    She is here today with her husband and reports that she has been very well since discharge. PPM site has been stable with no redness or overt pain. She has been following arm movement restrictions. Groin sites look great with no bleeding or hematoma. She reports that she has not been taking ASA 81mg  daily therefore this reiterated that she needs to be on this daily. They will pick that up on the way home and start today. Her breathing has improved and she denies chest pain, SOB, palpitations, LE edema, orthopnea, PND, dizziness, or syncope. No bleeding in stool or urine. She has been ambulating more and can tell she has improved stamina. She has follow with EP for post PPM wound check 9/5.    Patient seen in the ED 9/20 with th AF clinic after device shoowed new onset AF. ASA transitioned to Eliquis. Seen in ED with orthostatic hypotension although discharged. Returned once again 01/06/23 with palpitations. EP team reviewed PPM tracings that showed no AF or other arrhthymias. Patient was discharged home.   Today she is here  Severe AS: s/p successful TAVR with a 26 mm Edwards Sapien 3 Ultra Resilia THV via the TF approach on 12/02/22. Post operative echo with stable 26mm S3UR valve with a mean gradient at . Groin sites remain stable. Reports she had not been taking ASA 81mg  daily therefore instructed to start this today. No need for dental SBE as she is edentulous. Plan one month follow up with repeat echo.    Chronic diastolic CHF: Volume stable today on exam with NYHA class Reviewed proper  management with daily weights. They are to buy scales today as well otherwise continue current Lisinopril-HCT and torsemide dosing.    CHB s/p PPM: Noted to be high risk for post TAVR arrhthymias therefore ZIO placed at discharge. Began having pauses with progression to CHB and is now s/p PPM MDT placement. Sees EP for wound check 9/5. Doing well with stable site. Understands arm movement restrictions. H   HTN: Stable with no changes to therapy today.    DMT2: Follow with PCP. Continue current regimen     Morbid obesity: Body mass index is 35.65 kg/m. Resume Ozempic.   Elevated Raise Score: with underlying RBBB and severe AS, she is at increased risk for cardiac amyloid. Discussed today and will place order for PYP scan.     Studies Reviewed: .   Cardiac Studies & Procedures   CARDIAC CATHETERIZATION  CARDIAC CATHETERIZATION 12/04/2022  Narrative 1.  Successful right IJ temporary pacemaker with a threshold of 0.3 mA, output of 5 mA, and backup rate of 50 bpm.  Recommendation: Management per EP service.   CARDIAC CATHETERIZATION  CARDIAC CATHETERIZATION 10/14/2022  Narrative Images from the original result were not included.    LV end diastolic pressure is mildly elevated.   No indication for antiplatelet therapy at this time .  LM: Normal. No significant disease LAD: Normal. No significant disease Lcx: Dominant. No significant disease RCA: Nondominant. No significant disease  RA: 8 mmHg RV: 49/6 mmHg PA:46/25 mmHg, mPAP 31 mmHg PCW: 17 mmHg  CO: 5.3 L.min CI: 2.6 L/min/m2  Simultaneous LV-AO measurement using Langston cathteer Aortic valve mean PG 25 mmHg, AVA 0.94 cm2  Conclusion: No significant coronary artery disease Mildly elevated filling pressures Mild PH, WHO Grp II Aortic stenosis, moderate by gradients, severe by area. In the correct context, aortic stenosis could possibly be paradoxically low flow low gradient. Alternatively, dyspnea could be due to  mild PH, possible diastolic dysfunction. Recommend clinical correlation.   Elder Negus, MD Pager: [phone] Office: [phone]  Findings Coronary Findings Diagnostic  Dominance: Left  Left Main Vessel is normal in caliber. Vessel is angiographically normal.  Left Anterior Descending Vessel is normal in caliber. Vessel is angiographically normal.  Left Circumflex Vessel is normal in caliber. Vessel is angiographically normal.  Right Coronary Artery Vessel is small. Vessel is angiographically normal.  Intervention  No interventions have been documented.     ECHOCARDIOGRAM  ECHOCARDIOGRAM COMPLETE 12/03/2022  Narrative ECHOCARDIOGRAM REPORT    Patient Name:   [name] Date of Exam: 12/03/2022 Medical Rec #:  [id number]     Height:       64.0 in Accession #:    [id number]    Weight:       207.7 lb Date of Birth:  [birth year]     BSA:          1.988 m Patient Age:    77 years      BP:  Severe AS: s/p successful TAVR with a 26 mm Edwards Sapien 3 Ultra Resilia THV via the TF approach on 12/02/22. Post operative echo with stable 26mm S3UR valve with a mean gradient at . Groin sites remain stable. Reports she had not been taking ASA 81mg  daily therefore instructed to start this today. No need for dental SBE as she is edentulous. Plan one month follow up with repeat echo.    Chronic diastolic CHF: Volume stable today on exam with NYHA class Reviewed proper  management with daily weights. They are to buy scales today as well otherwise continue current Lisinopril-HCT and torsemide dosing.    CHB s/p PPM: Noted to be high risk for post TAVR arrhthymias therefore ZIO placed at discharge. Began having pauses with progression to CHB and is now s/p PPM MDT placement. Sees EP for wound check 9/5. Doing well with stable site. Understands arm movement restrictions. H   HTN: Stable with no changes to therapy today.    DMT2: Follow with PCP. Continue current regimen     Morbid obesity: Body mass index is 35.65 kg/m. Resume Ozempic.   Elevated Raise Score: with underlying RBBB and severe AS, she is at increased risk for cardiac amyloid. Discussed today and will place order for PYP scan.     Studies Reviewed: .   Cardiac Studies & Procedures   CARDIAC CATHETERIZATION  CARDIAC CATHETERIZATION 12/04/2022  Narrative 1.  Successful right IJ temporary pacemaker with a threshold of 0.3 mA, output of 5 mA, and backup rate of 50 bpm.  Recommendation: Management per EP service.   CARDIAC CATHETERIZATION  CARDIAC CATHETERIZATION 10/14/2022  Narrative Images from the original result were not included.    LV end diastolic pressure is mildly elevated.   No indication for antiplatelet therapy at this time .  LM: Normal. No significant disease LAD: Normal. No significant disease Lcx: Dominant. No significant disease RCA: Nondominant. No significant disease  RA: 8 mmHg RV: 49/6 mmHg PA:46/25 mmHg, mPAP 31 mmHg PCW: 17 mmHg  CO: 5.3 L.min CI: 2.6 L/min/m2  Simultaneous LV-AO measurement using Langston cathteer Aortic valve mean PG 25 mmHg, AVA 0.94 cm2  Conclusion: No significant coronary artery disease Mildly elevated filling pressures Mild PH, WHO Grp II Aortic stenosis, moderate by gradients, severe by area. In the correct context, aortic stenosis could possibly be paradoxically low flow low gradient. Alternatively, dyspnea could be due to  mild PH, possible diastolic dysfunction. Recommend clinical correlation.   Elder Negus, MD Pager: [phone] Office: [phone]  Findings Coronary Findings Diagnostic  Dominance: Left  Left Main Vessel is normal in caliber. Vessel is angiographically normal.  Left Anterior Descending Vessel is normal in caliber. Vessel is angiographically normal.  Left Circumflex Vessel is normal in caliber. Vessel is angiographically normal.  Right Coronary Artery Vessel is small. Vessel is angiographically normal.  Intervention  No interventions have been documented.     ECHOCARDIOGRAM  ECHOCARDIOGRAM COMPLETE 12/03/2022  Narrative ECHOCARDIOGRAM REPORT    Patient Name:   [name] Date of Exam: 12/03/2022 Medical Rec #:  [id number]     Height:       64.0 in Accession #:    [id number]    Weight:       207.7 lb Date of Birth:  [birth year]     BSA:          1.988 m Patient Age:    77 years      BP:

## 2023-01-12 ENCOUNTER — Ambulatory Visit (HOSPITAL_BASED_OUTPATIENT_CLINIC_OR_DEPARTMENT_OTHER): Payer: Medicare HMO

## 2023-01-12 ENCOUNTER — Other Ambulatory Visit: Payer: Self-pay | Admitting: Cardiology

## 2023-01-12 ENCOUNTER — Ambulatory Visit (HOSPITAL_COMMUNITY): Payer: Medicare HMO | Attending: Cardiovascular Disease | Admitting: Cardiology

## 2023-01-12 VITALS — BP 140/78 | HR 71 | Ht 64.0 in | Wt 210.0 lb

## 2023-01-12 DIAGNOSIS — I480 Paroxysmal atrial fibrillation: Secondary | ICD-10-CM | POA: Insufficient documentation

## 2023-01-12 DIAGNOSIS — Z952 Presence of prosthetic heart valve: Secondary | ICD-10-CM

## 2023-01-12 DIAGNOSIS — E119 Type 2 diabetes mellitus without complications: Secondary | ICD-10-CM | POA: Diagnosis not present

## 2023-01-12 DIAGNOSIS — I442 Atrioventricular block, complete: Secondary | ICD-10-CM | POA: Diagnosis not present

## 2023-01-12 DIAGNOSIS — Z950 Presence of cardiac pacemaker: Secondary | ICD-10-CM | POA: Diagnosis not present

## 2023-01-12 DIAGNOSIS — I4510 Unspecified right bundle-branch block: Secondary | ICD-10-CM | POA: Insufficient documentation

## 2023-01-12 LAB — ECHOCARDIOGRAM COMPLETE
AR max vel: 2.06 cm2
AV Area VTI: 1.99 cm2
AV Area mean vel: 1.98 cm2
AV Mean grad: 4 mm[Hg]
AV Peak grad: 7.1 mm[Hg]
Ao pk vel: 1.33 m/s
Area-P 1/2: 2.81 cm2
Est EF: 55
S' Lateral: 3 cm

## 2023-01-12 NOTE — Patient Instructions (Signed)
Medication Instructions:   *If you need a refill on your cardiac medications before your next appointment, please call your pharmacy*   Lab Work:  If you have labs (blood work) drawn today and your tests are completely normal, you will receive your results only by: MyChart Message (if you have MyChart) OR A paper copy in the mail If you have any lab test that is abnormal or we need to change your treatment, we will call you to review the results.   Testing/Procedures:    Follow-Up: At [facility], you and your health needs are our priority.  As part of our continuing mission to provide you with exceptional heart care, we have created designated Provider Care Teams.  These Care Teams include your primary Cardiologist (physician) and Advanced Practice Providers (APPs -  Physician Assistants and Nurse Practitioners) who all work together to provide you with the care you need, when you need it.  We recommend signing up for the patient portal called "MyChart".  Sign up information is provided on this After Visit Summary.  MyChart is used to connect with patients for Virtual Visits (Telemedicine).  Patients are able to view lab/test results, encounter notes, upcoming appointments, etc.  Non-urgent messages can be sent to your provider as well.   To learn more about what you can do with MyChart, go to [email or web address].    Your next appointment:  Keep planned follow up visit with Dr Odis Hollingshead.

## 2023-01-19 ENCOUNTER — Telehealth: Payer: Self-pay | Admitting: Cardiology

## 2023-01-19 ENCOUNTER — Ambulatory Visit: Payer: Medicare HMO | Attending: Cardiology | Admitting: Cardiology

## 2023-01-19 ENCOUNTER — Encounter: Payer: Self-pay | Admitting: Cardiology

## 2023-01-19 VITALS — BP 128/72 | HR 72 | Resp 16 | Ht 64.0 in | Wt 206.8 lb

## 2023-01-19 DIAGNOSIS — I480 Paroxysmal atrial fibrillation: Secondary | ICD-10-CM | POA: Diagnosis not present

## 2023-01-19 DIAGNOSIS — I10 Essential (primary) hypertension: Secondary | ICD-10-CM | POA: Diagnosis not present

## 2023-01-19 DIAGNOSIS — Z853 Personal history of malignant neoplasm of breast: Secondary | ICD-10-CM

## 2023-01-19 DIAGNOSIS — Z952 Presence of prosthetic heart valve: Secondary | ICD-10-CM | POA: Diagnosis not present

## 2023-01-19 DIAGNOSIS — Z950 Presence of cardiac pacemaker: Secondary | ICD-10-CM | POA: Diagnosis not present

## 2023-01-19 DIAGNOSIS — I442 Atrioventricular block, complete: Secondary | ICD-10-CM | POA: Diagnosis not present

## 2023-01-19 DIAGNOSIS — E119 Type 2 diabetes mellitus without complications: Secondary | ICD-10-CM | POA: Diagnosis not present

## 2023-01-19 DIAGNOSIS — I350 Nonrheumatic aortic (valve) stenosis: Secondary | ICD-10-CM

## 2023-01-19 NOTE — Telephone Encounter (Signed)
Patient states she just left her appointment and missed a call before she could get to her car.

## 2023-01-19 NOTE — Patient Instructions (Signed)
Medication Instructions:  Your physician recommends that you continue on your current medications as directed. Please refer to the Current Medication list given to you today. *If you need a refill on your cardiac medications before your next appointment, please call your pharmacy*   Follow-Up: At [facility], you and your health needs are our priority.  As part of our continuing mission to provide you with exceptional heart care, we have created designated Provider Care Teams.  These Care Teams include your primary Cardiologist (physician) and Advanced Practice Providers (APPs -  Physician Assistants and Nurse Practitioners) who all work together to provide you with the care you need, when you need it.  We recommend signing up for the patient portal called "MyChart".  Sign up information is provided on this After Visit Summary.  MyChart is used to connect with patients for Virtual Visits (Telemedicine).  Patients are able to view lab/test results, encounter notes, upcoming appointments, etc.  Non-urgent messages can be sent to your provider as well.   To learn more about what you can do with MyChart, go to [email or web address].    Your next appointment:   3 month(s)  Provider:   Tessa Lerner, DO

## 2023-01-19 NOTE — Progress Notes (Signed)
Cardiology Office Note:  .   Date:  01/19/2023  ID:  [name], DOB 03-May-1945, MRN [id number] PCP:  Joycelyn Rua, MD  [facility] Providers Cardiologist:  Tessa Lerner, [facility] (established care 09/04/2022)  Electrophysiologist:  Lanier Prude, MD ,  Click to update primary MD,subspecialty MD or APP then REFRESH:1}    History of Present Illness: .   [name] is a 77 y.o. Caucasian female whose past medical history and cardiovascular risk factors includes: Aortic stenosis s/p 26 mm Edwards SAPIEN 3 Ultra Resilia THV via TF approach, status post pacemaker implant due to complete heart block (August 2024) with Dr. Lalla Brothers), paroxysmal atrial fibrillation, diabetes mellitus type 2, HTN, former smoker, hx breast cancer x2 (status postmastectomy, radiation, chemotherapy), history of ventricular tachycardia.   Referred to the practice for evaluation of shortness of breath.  She was noted to have aortic stenosis and she underwent 26 mm Edwards SAPIEN TAVR valve implant on December 10, 2022 with Dr. Lynnette Caffey.   Post TAVR workup she was also noted to have complete heart block underwent pacemaker implant with Dr. Lalla Brothers and is enrolled into the pacemaker clinic.  She is also noted to have paroxysmal atrial fibrillation after the device implant and was placed on anticoagulation for thromboembolic prophylaxis.  Since last office visit she has undergone TAVR, pacemaker implant, and diagnosed with A-fib.  She is tolerating anticoagulation well without any side effects or intolerances.  She does not endorse evidence of bleeding.  Patient states that she has episodes of palpitations which feel like A-fib episodes twice a day.  No recent pacemaker interrogation reports available for review.  Patient is accompanied by her husband at today's office visit.  Review of Systems: .   Review of Systems  Constitutional: Positive for malaise/fatigue.  Cardiovascular:  Negative for chest pain,  claudication, dyspnea on exertion, irregular heartbeat, leg swelling, near-syncope, orthopnea, palpitations, paroxysmal nocturnal dyspnea and syncope.  Respiratory:  Negative for shortness of breath.   Hematologic/Lymphatic: Negative for bleeding problem.  Musculoskeletal:  Negative for muscle cramps and myalgias.  Neurological:  Negative for dizziness and light-headedness.    Studies Reviewed:   EKG: EKG Interpretation Date/Time:  Monday January 19 2023 08:40:30 EDT Ventricular Rate:  72 PR Interval:  220 QRS Duration:  124 QT Interval:  434 QTC Calculation: 475 R Axis:   -71  Text Interpretation: Sinus rhythm with 1st degree A-V block Left axis deviation RSR' or QR pattern in V1 suggests right ventricular conduction delay Anteroseptal infarct , age undetermined When compared with ECG of 06-Jan-2023 13:40, Sinus rhythm has replaced Electronic ventricular pacemaker Confirmed by Tessa Lerner [phone]) on 01/19/2023 8:50:57 AM  Echocardiogram: 01/12/2023 (finger print)  1. Left ventricular ejection fraction, by estimation, is 55%. The left  ventricle has normal function. Left ventricular endocardial border not  optimally defined to evaluate regional wall motion. There is mild  concentric left ventricular hypertrophy. Left   ventricular diastolic parameters are consistent with Grade I diastolic  dysfunction (impaired relaxation).   2. Right ventricular systolic function is mildly reduced. The right  ventricular size is normal. There is normal pulmonary artery systolic  pressure. The estimated right ventricular systolic pressure is 27.0 mmHg.   3. Left atrial size was mildly dilated.   4. The mitral valve is normal in structure. Trivial mitral valve  regurgitation. No evidence of mitral stenosis.   5. Bioprosthetic aortic valve s/p TAVR with 26 mm Edwards Sapien THV.  Very poor images, mean gradient  4 mmHg is likely inaccurate. No  peri-valvular leakage noted.   6. The inferior vena  cava is dilated in size with >50% respiratory  variability, suggesting right atrial pressure of 8 mmHg.   7. Technically difficult study with very poor images. No apical windows.   Stress Testing: Regadenoson Nuclear stress test 09/12/2022: There is a moderate sized moderate reversible defect in the lateral, inferior and infero-apical regions.  Overall LV systolic function is abnormal with regional wall motion abnormalities in the same distribution (RCA) Stress LV EF: 46%.  Nondiagnostic ECG stress. The heart rate response was consistent with Regadenoson.  No previous exam available for comparison. Intermediate risk study.   CCTA:  July 2023 1. Annular measurements support a 26 mm S3 or 29 mm Evolut Pro.   2. There is moderate subannular calcium under the LCC extending to the AMVL.   3. Sufficient coronary to annulus distance.   4. Optimal Fluoroscopic Angle for Delivery: LAO 17 CRA 2   5. Dilated pulmonary artery suggestive of pulmonary hypertension.  Heart Catheterization: 10/14/2022 LM: Normal. No significant disease LAD: Normal. No significant disease Lcx: Dominant. No significant disease RCA: Nondominant. No significant disease   RA: 8 mmHg RV: 49/6 mmHg PA:46/25 mmHg, mPAP 31 mmHg PCW: 17 mmHg   CO: 5.3 L.min CI: 2.6 L/min/m2   Simultaneous LV-AO measurement using Langston cathteer Aortic valve mean PG 25 mmHg, AVA 0.94 cm2   Conclusion: No significant coronary artery disease Mildly elevated filling pressures Mild PH, WHO Grp II Aortic stenosis, moderate by gradients, severe by area. In the correct context, aortic stenosis could possibly be paradoxically low flow low gradient. Alternatively, dyspnea could be due to mild PH, possible diastolic dysfunction. Recommend clinical correlation.   Carotid duplex: Carotid artery duplex 09/22/2022 Duplex suggests stenosis in the right internal carotid artery (minimal). Duplex suggests stenosis in the left internal  carotid artery (1-15%). Both vertebral arteries are patent with antegrade flow. No prior studies for comparison. Follow up in one year is appropriate if clinically indicated.   Risk Assessment/Calculations:   Click Here to Calculate/Change CHADS2VASc Score The patient's CHADS2-VASc score is 5, indicating a 7.2% annual risk of stroke. CHF History: No HTN History: Yes Diabetes History: Yes Stroke History: No Vascular Disease History: No  Labs:       Latest Ref Rng & Units 01/06/2023    1:55 PM 01/02/2023   11:49 AM 12/04/2022    9:31 AM  CBC  WBC 4.0 - 10.5 K/uL 5.8  6.1  8.7   Hemoglobin 12.0 - 15.0 g/dL 84.6  96.2  95.2   Hematocrit 36.0 - 46.0 % 40.3  41.8  44.7   Platelets 150 - 400 K/uL 178  169  144        Latest Ref Rng & Units 01/06/2023    1:55 PM 01/02/2023   11:49 AM 12/05/2022    6:45 AM  BMP  Glucose 70 - 99 mg/dL 841  324  401   BUN 8 - 23 mg/dL 19  20  9    Creatinine 0.44 - 1.00 mg/dL 0.27  2.53  6.64   Sodium 135 - 145 mmol/L 138  139  135   Potassium 3.5 - 5.1 mmol/L 4.1  4.0  3.6   Chloride 98 - 111 mmol/L 102  100  99   CO2 22 - 32 mmol/L 28  26  25    Calcium 8.9 - 10.3 mg/dL 9.1  9.3  8.4  Latest Ref Rng & Units 01/06/2023    1:55 PM 01/02/2023   11:49 AM 12/05/2022    6:45 AM  CMP  Glucose 70 - 99 mg/dL 401  027  253   BUN 8 - 23 mg/dL 19  20  9    Creatinine 0.44 - 1.00 mg/dL 6.64  4.03  4.74   Sodium 135 - 145 mmol/L 138  139  135   Potassium 3.5 - 5.1 mmol/L 4.1  4.0  3.6   Chloride 98 - 111 mmol/L 102  100  99   CO2 22 - 32 mmol/L 28  26  25    Calcium 8.9 - 10.3 mg/dL 9.1  9.3  8.4     No results found for: "CHOL", "HDL", "LDLCALC", "LDLDIRECT", "TRIG", "CHOLHDL" No results for input(s): "LIPOA" in the last 8760 hours. No components found for: "NTPROBNP" No results for input(s): "PROBNP" in the last 8760 hours. Recent Labs    12/04/22 0931  TSH 3.059    Physical Exam:    Today's Vitals   01/19/23 0835  BP: 128/72  Pulse: 72   Resp: 16  SpO2: 91%  Weight: 206 lb 12.8 oz (93.8 kg)  Height: 5\' 4"  (1.626 m)   Body mass index is 35.5 kg/m. Wt Readings from Last 3 Encounters:  01/19/23 206 lb 12.8 oz (93.8 kg)  01/12/23 210 lb (95.3 kg)  01/06/23 207 lb 0.2 oz (93.9 kg)    Physical Exam  Constitutional: No distress.  Appears older than stated age, ambulates with a cane,, hemodynamically stable.   Neck: No JVD present.  Cardiovascular: Normal rate, regular rhythm, S1 normal, S2 normal, intact distal pulses and normal pulses. Exam reveals no gallop, no S3 and no S4.  No murmur heard. Pulses:      Carotid pulses are  on the right side with bruit and  on the left side with bruit. Pulmonary/Chest: Effort normal and breath sounds normal. No stridor. She has no wheezes. She has no rales.  Bilateral mastectomies, left sided pacemaker.   Abdominal: Soft. Bowel sounds are normal. She exhibits no distension. There is no abdominal tenderness.  Musculoskeletal:        General: Edema (improved) present. No tenderness.     Cervical back: Neck supple.  Neurological: She is alert and oriented to person, place, and time. She has intact cranial nerves (2-12).  Skin: Skin is warm and moist.     Impression & Recommendation(s):  Impression:   ICD-10-CM   1. Nonrheumatic aortic valve stenosis  I35.0 EKG 12-Lead    2. S/P TAVR (transcatheter aortic valve replacement)  Z95.2     3. Complete heart block (HCC)  I44.2     4. Pacemaker  Z95.0     5. Paroxysmal A-fib (HCC)  I48.0     6. Diabetes mellitus without complication (HCC)  E11.9     7. Essential hypertension  I10     8. HX: breast cancer  Z85.3        Recommendation(s):  Nonrheumatic aortic valve stenosis S/P TAVR (transcatheter aortic valve replacement) 26 mm Edwards SAPIEN Dyspnea has significantly improved since aortic valve replacement Echo from September 2024 independently reviewed-LVEF is preserved, aortic valve is well-seated without valvular  stenosis or regurgitation. Has a follow-up appointment with structural heart team in 1 year. Antibiotic prophylaxis will be needed prior to dental procedures. Monitor for now  Complete heart block [facility]) Pacemaker Implanted August 2024 with Dr. Lalla Brothers dual-chamber pacemaker She has been enrolled into the  device clinic. No recent pacemaker interrogation reports to evaluate A-fib burden.   She will send a print transmission once she goes home. Pacemaker site is healed well.  Paroxysmal A-fib (HCC) Rate control: N/A. Thromboembolic prophylaxis: Eliquis. Does not endorse evidence of bleeding. Patient understands to have her hemoglobin checked every 6 months to make sure she is not having evidence of anemia. Will await for pacemaker transmissions to evaluate A-fib burden. Will likely start AV nodal blocking agents if needed prior to considering antiarrhythmic medications. Further recommendations to follow.  Diabetes mellitus without complication (HCC) Reemphasized importance of glycemic control. Currently on Ozempic, lisinopril Will need to be on statin therapy given the diagnosis of diabetes-defer to PCP for now.  Essential hypertension Office blood pressures are very well-controlled. Continue lisinopril/HCTZ combination.  Plan of care discussed with patient and husband at today's office visit. Would like to see her back in 3 months to reevaluate the burden of A-fib as per her pacemaker transmission reports and how she is doing symptomatically.  Orders Placed:  Orders Placed This Encounter  Procedures   EKG 12-Lead    As part of medical decision making results of the status post TAVR implant, pacemaker implant, reviewed echo report, EKG, discharge summary, and last office note from structural heart dated 01/12/2023. were reviewed independently at today's visit.   Final Medication List:   No orders of the defined types were placed in this encounter.   There are no discontinued  medications.   Current Outpatient Medications:    albuterol (VENTOLIN HFA) 108 (90 Base) MCG/ACT inhaler, Inhale 2 puffs into the lungs every 4 (four) hours as needed for wheezing or shortness of breath., Disp: , Rfl:    apixaban (ELIQUIS) 5 MG TABS tablet, Take 1 tablet (5 mg total) by mouth 2 (two) times daily., Disp: 60 tablet, Rfl: 6   B Complex Vitamins (B COMPLEX PO), Take 1 capsule by mouth daily., Disp: , Rfl:    Cholecalciferol (VITAMIN D3) 125 MCG (5000 UT) TABS, Take 5,000 Units by mouth daily., Disp: , Rfl:    lisinopril-hydrochlorothiazide (ZESTORETIC) 20-12.5 MG tablet, Take 1 tablet by mouth daily., Disp: , Rfl:    Multiple Vitamin (MULTIVITAMINS PO), Take 1 tablet by mouth daily., Disp: , Rfl:    naproxen sodium (ALEVE) 220 MG tablet, Take 220 mg by mouth daily., Disp: , Rfl:    OZEMPIC, 0.25 OR 0.5 MG/DOSE, 2 MG/3ML SOPN, Inject 0.5 mg into the skin every Saturday., Disp: , Rfl:    pantoprazole (PROTONIX) 40 MG tablet, Take 40 mg by mouth daily., Disp: , Rfl:    polyvinyl alcohol (LIQUIFILM TEARS) 1.4 % ophthalmic solution, Place 1 drop into both eyes as needed for dry eyes., Disp: , Rfl:    potassium chloride SA (KLOR-CON M) 10 MEQ tablet, Take 2 tablets (20 mEq total) by mouth 2 (two) times daily., Disp: 30 tablet, Rfl: 6   solifenacin (VESICARE) 5 MG tablet, Take 5 mg by mouth daily., Disp: , Rfl:    torsemide (DEMADEX) 10 MG tablet, Take 1 tablet (10 mg total) by mouth every morning., Disp: 90 tablet, Rfl: 3  Consent:   NA  Disposition:   Return in about 3 months (around 04/21/2023) for Follow up, A. fib. or sooner if needed.  Her questions and concerns were addressed to her satisfaction. She voices understanding of the recommendations provided during this encounter.    Signed, Delilah Shan, [facility] [place]  [facility]  [address] [place],  [place] [address] [phone] 01/19/2023 3:42 PM

## 2023-01-19 NOTE — Telephone Encounter (Signed)
Left voicemail to return call to office.

## 2023-01-23 ENCOUNTER — Ambulatory Visit: Payer: Self-pay | Admitting: Cardiology

## 2023-01-23 IMAGING — RF DG SWALLOWING FUNCTION
12 of 24 series · 12 of 24 positions shown · non-contrast
Comparison: NONE.

CLINICAL DATA: Dysphagia

EXAM:
MODIFIED BARIUM SWALLOW
TECHNIQUE: Different consistencies of barium were administered orally to the
patient by the Speech Pathologist. Imaging of the pharynx was
performed in the lateral projection. Alphamikechoromeo Huddon, NP was present
in the fluoroscopy room during this study, which was supervised and
interpreted by Shunta Lundquist, M.D.
FLUOROSCOPY:
Radiation Exposure Index (as provided by the fluoroscopic device):
15.35 mGy mGy Kerma

[Series 2: run · 1 of 20 frames shown (1 of 12)]
[frame 1/20]
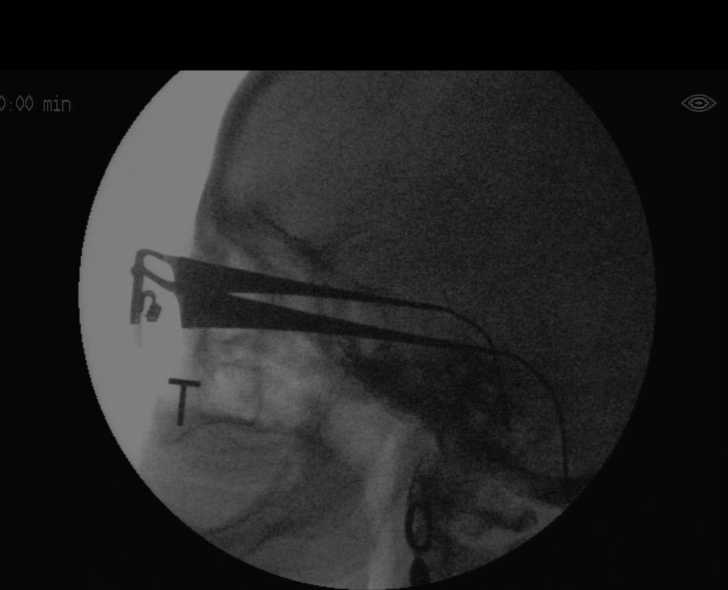

[Series 4: run · 1 of 22 frames shown (2 of 12)]
[frame 4/22]
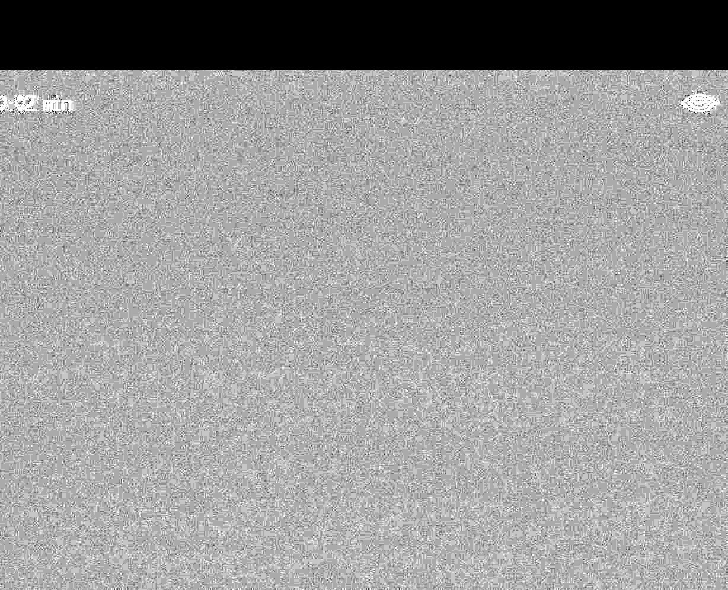

[Series 6: run · 1 of 21 frames shown (3 of 12)]
[frame 11/21]
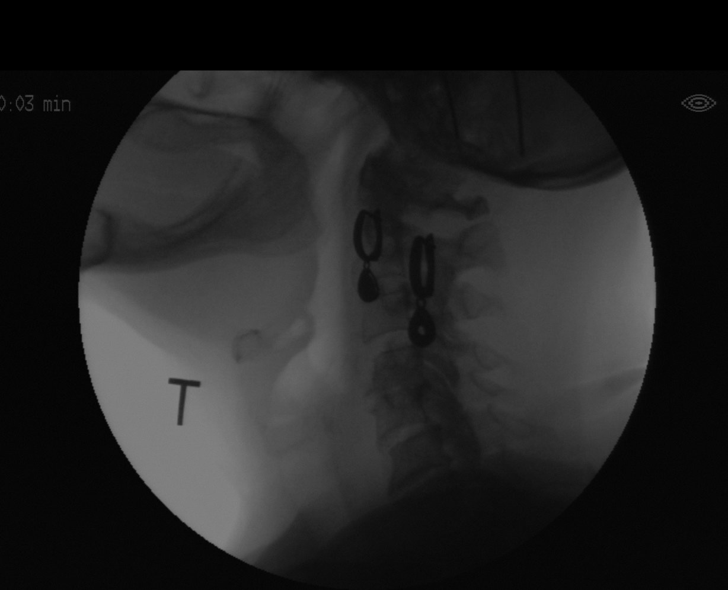

[Series 8: run · 1 of 289 frames shown (4 of 12)]
[frame 44/289]
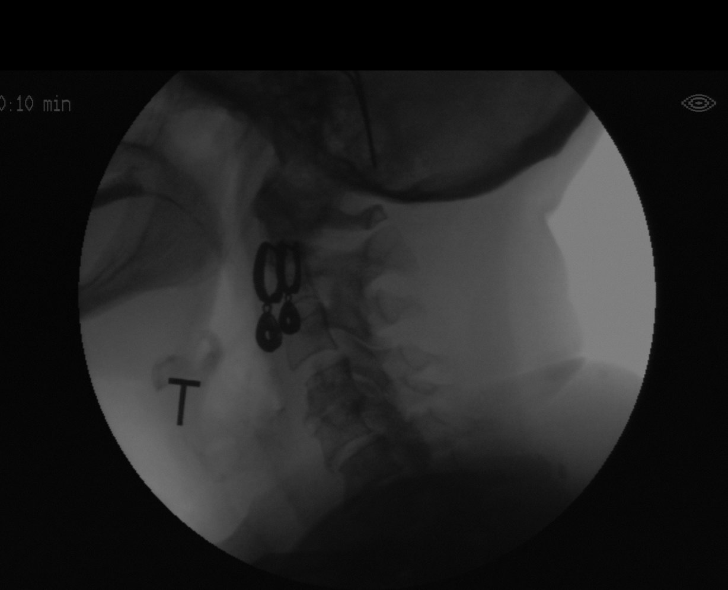

[Series 10: run · 1 of 125 frames shown (5 of 12)]
[frame 63/125]
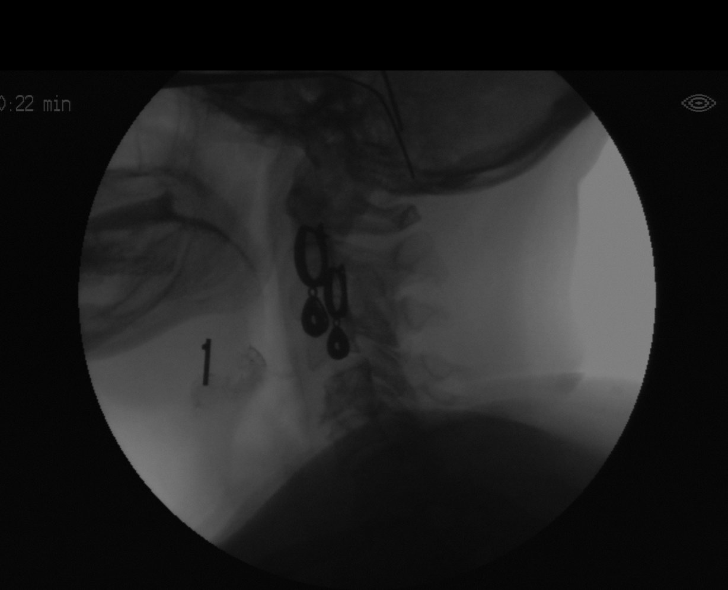

[Series 12: run · 1 of 165 frames shown (6 of 12)]
[frame 25/165]
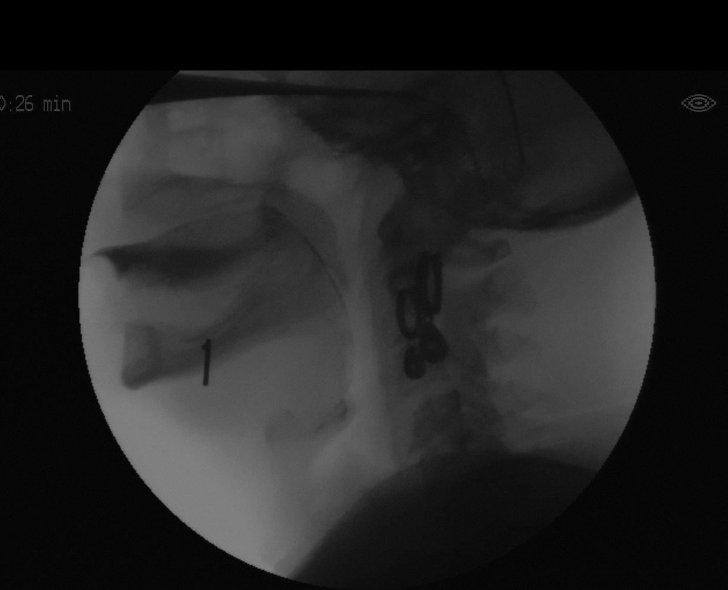

[Series 14: run · 1 of 354 frames shown (7 of 12)]
[frame 178/354]
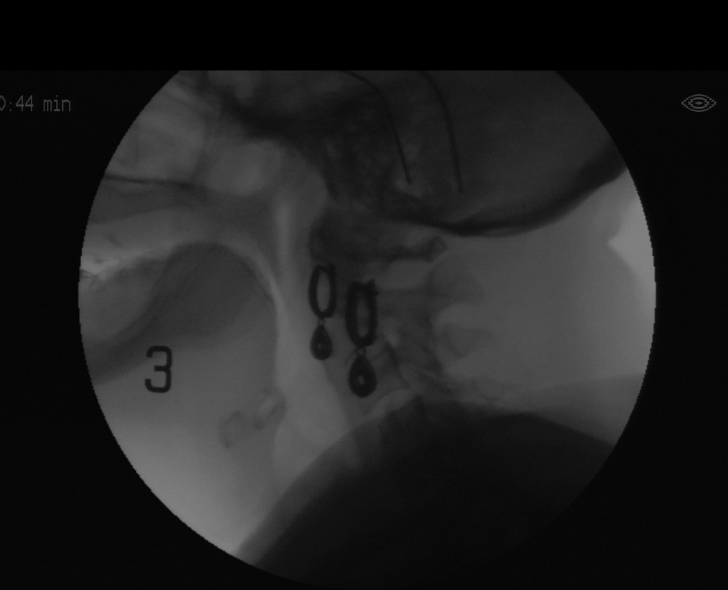

[Series 16: run · 1 of 35 frames shown (8 of 12)]
[frame 24/35]
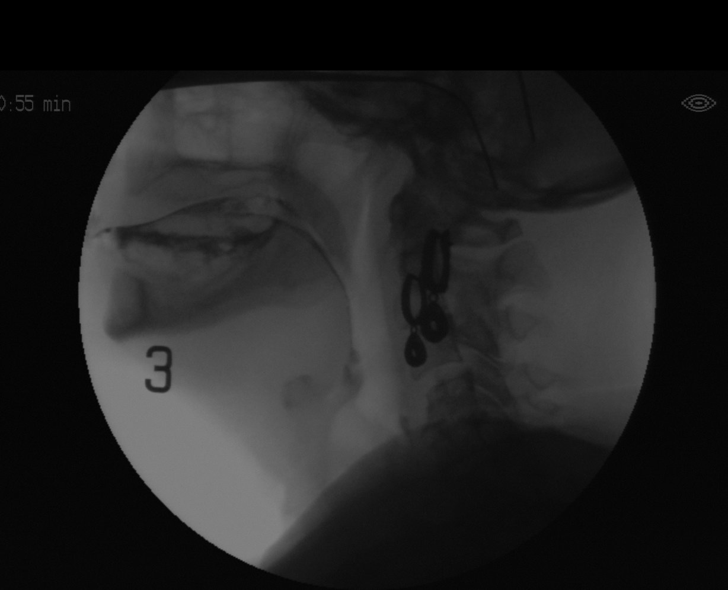

[Series 18: run · 1 of 291 frames shown (9 of 12)]
[frame 212/291]
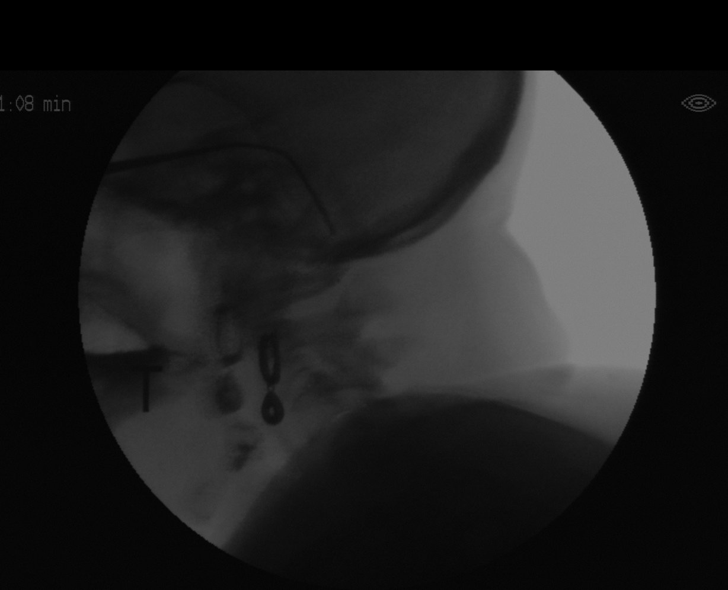

[Series 20: run · 1 of 92 frames shown (10 of 12)]
[frame 47/92]
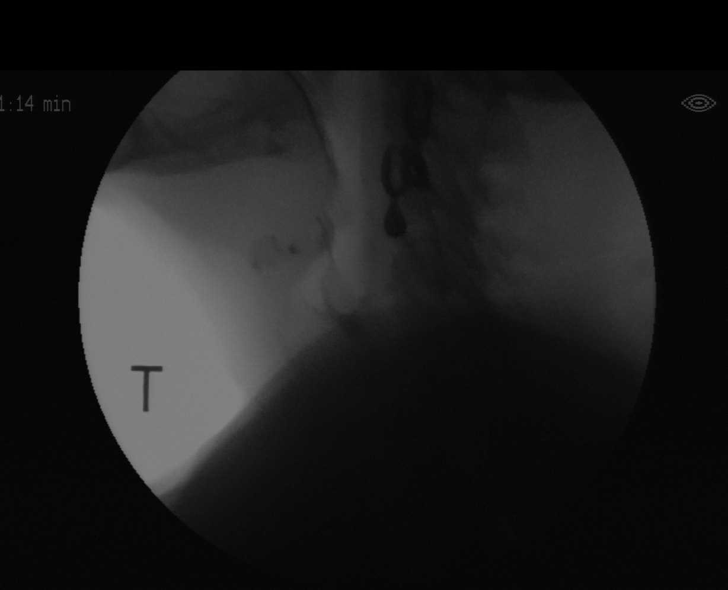

[Series 22: run · 1 of 49 frames shown (11 of 12)]
[frame 42/49]
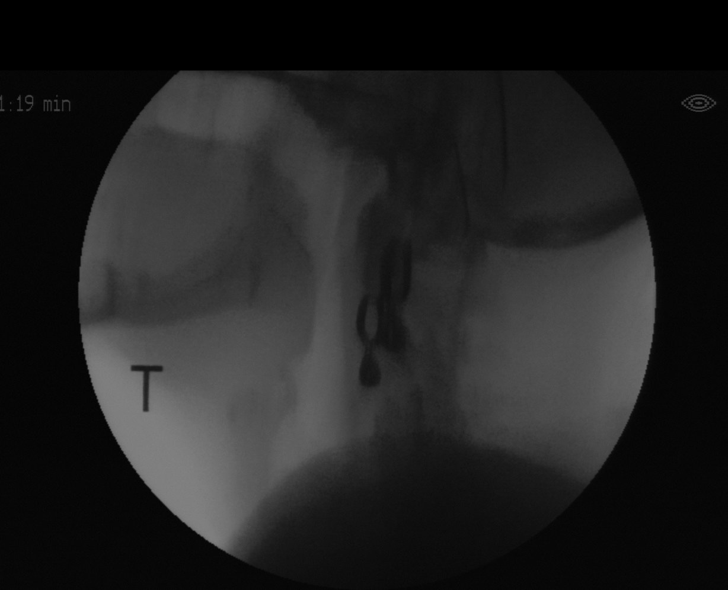

[Series 24: run · 1 of 361 frames shown (12 of 12)]
[frame 307/361]
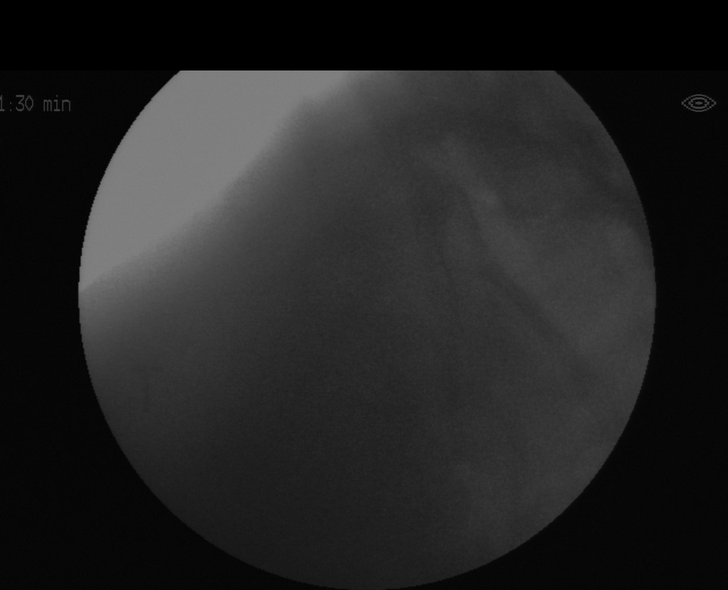

[12 of 24 positions shown; findings below may reference images not displayed]

FINDINGS: Please see speech pathology notes.
IMPRESSION: Please refer to the Speech Pathologists report for complete details
and recommendations.

## 2023-02-03 ENCOUNTER — Telehealth (HOSPITAL_COMMUNITY): Payer: Self-pay | Admitting: *Deleted

## 2023-02-03 NOTE — Telephone Encounter (Signed)
Left instructons  on answering machine for Amyloid study.

## 2023-02-05 ENCOUNTER — Ambulatory Visit (HOSPITAL_COMMUNITY): Payer: Medicare HMO | Attending: Cardiology

## 2023-02-05 DIAGNOSIS — Z952 Presence of prosthetic heart valve: Secondary | ICD-10-CM | POA: Diagnosis not present

## 2023-02-05 LAB — MYOCARDIAL AMYLOID PLANAR & SPECT: H/CL Ratio: 1.2

## 2023-02-05 MED ORDER — TECHNETIUM TC 99M PYROPHOSPHATE
21.1000 | Freq: Once | INTRAVENOUS | Status: AC
  Administered 2023-02-05: 21.1 via INTRAVENOUS

## 2023-02-10 ENCOUNTER — Other Ambulatory Visit: Payer: Self-pay | Admitting: Cardiology

## 2023-02-10 DIAGNOSIS — I4891 Unspecified atrial fibrillation: Secondary | ICD-10-CM | POA: Diagnosis not present

## 2023-02-10 DIAGNOSIS — R0989 Other specified symptoms and signs involving the circulatory and respiratory systems: Secondary | ICD-10-CM | POA: Diagnosis not present

## 2023-02-10 DIAGNOSIS — R0602 Shortness of breath: Secondary | ICD-10-CM | POA: Diagnosis not present

## 2023-02-10 DIAGNOSIS — Z0000 Encounter for general adult medical examination without abnormal findings: Secondary | ICD-10-CM | POA: Diagnosis not present

## 2023-02-10 DIAGNOSIS — Z23 Encounter for immunization: Secondary | ICD-10-CM | POA: Diagnosis not present

## 2023-02-10 DIAGNOSIS — E782 Mixed hyperlipidemia: Secondary | ICD-10-CM | POA: Diagnosis not present

## 2023-02-10 DIAGNOSIS — Z952 Presence of prosthetic heart valve: Secondary | ICD-10-CM | POA: Diagnosis not present

## 2023-02-10 DIAGNOSIS — I10 Essential (primary) hypertension: Secondary | ICD-10-CM | POA: Diagnosis not present

## 2023-02-10 DIAGNOSIS — E119 Type 2 diabetes mellitus without complications: Secondary | ICD-10-CM | POA: Diagnosis not present

## 2023-02-10 DIAGNOSIS — E1159 Type 2 diabetes mellitus with other circulatory complications: Secondary | ICD-10-CM | POA: Diagnosis not present

## 2023-02-10 DIAGNOSIS — Z9181 History of falling: Secondary | ICD-10-CM | POA: Diagnosis not present

## 2023-02-12 ENCOUNTER — Ambulatory Visit: Payer: Medicare HMO | Attending: Cardiology

## 2023-02-12 DIAGNOSIS — Z952 Presence of prosthetic heart valve: Secondary | ICD-10-CM | POA: Diagnosis not present

## 2023-02-13 LAB — IMMUNOFIXATION, URINE

## 2023-02-17 LAB — MULTIPLE MYELOMA PANEL, SERUM
Albumin SerPl Elph-Mcnc: 3.7 g/dL (ref 2.9–4.4)
Albumin/Glob SerPl: 1.2 (ref 0.7–1.7)
Alpha 1: 0.3 g/dL (ref 0.0–0.4)
Alpha2 Glob SerPl Elph-Mcnc: 0.9 g/dL (ref 0.4–1.0)
B-Globulin SerPl Elph-Mcnc: 1 g/dL (ref 0.7–1.3)
Gamma Glob SerPl Elph-Mcnc: 0.9 g/dL (ref 0.4–1.8)
Globulin, Total: 3.1 g/dL (ref 2.2–3.9)
IgA/Immunoglobulin A, Serum: 121 mg/dL (ref 64–422)
IgG (Immunoglobin G), Serum: 825 mg/dL (ref 586–1602)
IgM (Immunoglobulin M), Srm: 247 mg/dL — ABNORMAL HIGH (ref 26–217)
Total Protein: 6.8 g/dL (ref 6.0–8.5)

## 2023-02-17 LAB — KAPPA/LAMBDA LIGHT CHAINS
Ig Kappa Free Light Chain: 33.2 mg/L — ABNORMAL HIGH (ref 3.3–19.4)
Ig Lambda Free Light Chain: 20.1 mg/L (ref 5.7–26.3)
KAPPA/LAMBDA RATIO: 1.65 (ref 0.26–1.65)

## 2023-02-24 ENCOUNTER — Other Ambulatory Visit (HOSPITAL_COMMUNITY): Payer: Self-pay

## 2023-02-24 ENCOUNTER — Telehealth: Payer: Self-pay | Admitting: Cardiology

## 2023-02-24 DIAGNOSIS — I480 Paroxysmal atrial fibrillation: Secondary | ICD-10-CM

## 2023-02-24 NOTE — Telephone Encounter (Signed)
If we do PAP through [employer], it's only good till 04/14/23, then they will have to meet requirements again next year in order to qualify for assistance in 2025. All medicare approvals end with the year then they have to face the deductible up front for the next year till they meet the 3% out of pocket rx expense requirement [employer] has to qualify for 2025 PAP. Per test claim Pradaxa is non form and Xarelto is plan benefit exclusion. Only other alternative that would be cheaper for patient would be Warfarin.

## 2023-02-24 NOTE — Telephone Encounter (Signed)
Pt c/o medication issue:  1. Name of Medication:   apixaban (ELIQUIS) 5 MG TABS tablet    2. How are you currently taking this medication (dosage and times per day)? As written   3. Are you having a reaction (difficulty breathing--STAT)? No   4. What is your medication issue? Pt called in stating this medication is too expensive, would like to discuss her options to cut cost.

## 2023-02-24 NOTE — Telephone Encounter (Signed)
Forgot to mention there's some major changes happening to Medicare in 2025 so likely most patients shouldn't have a donut hole as of January 2025. If I understand correctly, copays are supposed to be cheaper and once the coverage gap is hit, they should have a $0 copay. Patient can always touch base with a representative at [facility] to confirm exactly how it will affect their plan.

## 2023-02-24 NOTE — Telephone Encounter (Signed)
Spoke with the patient who states that she went to get a refill of her Eliqius and it was going to cost her $480. She states that she cannot afford this and would like to know if there are any alternatives. Advised that I would check with our pharmacy team to see if we can assist with getting the cost down or if there is an alternative that would be cheaper for her.

## 2023-02-25 MED ORDER — APIXABAN 5 MG PO TABS: 5.0000 mg | ORAL_TABLET | Freq: Two times a day (BID) | ORAL | 0 refills | Status: DC

## 2023-02-25 NOTE — Telephone Encounter (Signed)
Left message for patient to call back  

## 2023-02-25 NOTE — Telephone Encounter (Signed)
Spoke with the patient and gave her recommendations from PA team. Patient is currently out of Eliquis and is unable to pay for a refill. She states that since she has been off of it for several days now and she has felt better. She states that she was lightheaded and felt unsteady while she was on Eliquis. She is willing to retry it while she looks into assistance and talks with [employer]. Will see if we can provide her samples in the meantime.  If symptoms of lightheadedness return when she restarts she would like to discuss another medication. She states that she will be in [facility] tomorrow and can come by to pick up samples then.

## 2023-02-25 NOTE — Telephone Encounter (Signed)
Called pt to inform her that we were leaving her a month supply of Eliquis 5 mg tablets, pt never got a starter supply and a 30 day free card as well. I advised pt that if she has any other problems, questions or concerns, to give our office a call. Pt verbalized understanding. FYI

## 2023-02-25 NOTE — Telephone Encounter (Signed)
Patient returned RN Carlyle's call.

## 2023-02-27 MED ORDER — APIXABAN 5 MG PO TABS: 5.0000 mg | ORAL_TABLET | Freq: Two times a day (BID) | ORAL | 11 refills | Status: DC

## 2023-02-27 NOTE — Telephone Encounter (Signed)
Patient is calling stating that the 30 day card was not usable since she has had this script before. She also states that her fill is for 90 day and the 30 day would not be able to cover. She would like a call back to discuss further.

## 2023-02-27 NOTE — Addendum Note (Signed)
Addended by: Frutoso Schatz on: 02/27/2023 03:18 PM   Modules accepted: Orders

## 2023-02-27 NOTE — Telephone Encounter (Signed)
Spoke with the patient and her husband. They would like a 30 day prescription of Eliquis sent to the [facility] in [place]. They are going to use the free 30 day card there.

## 2023-03-03 ENCOUNTER — Encounter (HOSPITAL_COMMUNITY): Payer: Self-pay | Admitting: Cardiology

## 2023-03-03 ENCOUNTER — Ambulatory Visit (HOSPITAL_COMMUNITY)
Admission: RE | Admit: 2023-03-03 | Discharge: 2023-03-03 | Disposition: A | Discharge status: Home / Self Care | Payer: Medicare HMO | Source: Ambulatory Visit | Attending: Cardiology | Admitting: Cardiology

## 2023-03-03 ENCOUNTER — Telehealth (HOSPITAL_COMMUNITY): Payer: Self-pay

## 2023-03-03 ENCOUNTER — Other Ambulatory Visit (HOSPITAL_COMMUNITY): Payer: Self-pay

## 2023-03-03 VITALS — BP 110/70 | HR 77 | Wt 204.8 lb

## 2023-03-03 DIAGNOSIS — Z79899 Other long term (current) drug therapy: Secondary | ICD-10-CM | POA: Diagnosis not present

## 2023-03-03 DIAGNOSIS — Z953 Presence of xenogenic heart valve: Secondary | ICD-10-CM | POA: Diagnosis not present

## 2023-03-03 DIAGNOSIS — I110 Hypertensive heart disease with heart failure: Secondary | ICD-10-CM | POA: Diagnosis not present

## 2023-03-03 DIAGNOSIS — I350 Nonrheumatic aortic (valve) stenosis: Secondary | ICD-10-CM | POA: Diagnosis not present

## 2023-03-03 DIAGNOSIS — I480 Paroxysmal atrial fibrillation: Secondary | ICD-10-CM | POA: Insufficient documentation

## 2023-03-03 DIAGNOSIS — R0602 Shortness of breath: Secondary | ICD-10-CM

## 2023-03-03 DIAGNOSIS — I5033 Acute on chronic diastolic (congestive) heart failure: Secondary | ICD-10-CM | POA: Diagnosis present

## 2023-03-03 DIAGNOSIS — I5032 Chronic diastolic (congestive) heart failure: Secondary | ICD-10-CM | POA: Diagnosis not present

## 2023-03-03 DIAGNOSIS — I4819 Other persistent atrial fibrillation: Secondary | ICD-10-CM | POA: Diagnosis not present

## 2023-03-03 DIAGNOSIS — Z7901 Long term (current) use of anticoagulants: Secondary | ICD-10-CM | POA: Diagnosis not present

## 2023-03-03 DIAGNOSIS — Z45018 Encounter for adjustment and management of other part of cardiac pacemaker: Secondary | ICD-10-CM | POA: Diagnosis not present

## 2023-03-03 DIAGNOSIS — I442 Atrioventricular block, complete: Secondary | ICD-10-CM | POA: Insufficient documentation

## 2023-03-03 DIAGNOSIS — Z87891 Personal history of nicotine dependence: Secondary | ICD-10-CM | POA: Insufficient documentation

## 2023-03-03 LAB — BRAIN NATRIURETIC PEPTIDE: B Natriuretic Peptide: 127.1 pg/mL — ABNORMAL HIGH (ref 0.0–100.0)

## 2023-03-03 LAB — CBC
HCT: 41.8 % (ref 36.0–46.0)
Hemoglobin: 13.6 g/dL (ref 12.0–15.0)
MCH: 27.9 pg (ref 26.0–34.0)
MCHC: 32.5 g/dL (ref 30.0–36.0)
MCV: 85.7 fL (ref 80.0–100.0)
Platelets: 178 10*3/uL (ref 150–400)
RBC: 4.88 MIL/uL (ref 3.87–5.11)
RDW: 13.9 % (ref 11.5–15.5)
WBC: 6.6 10*3/uL (ref 4.0–10.5)
nRBC: 0 % (ref 0.0–0.2)

## 2023-03-03 LAB — BASIC METABOLIC PANEL
Anion gap: 9 (ref 5–15)
BUN: 21 mg/dL (ref 8–23)
CO2: 29 mmol/L (ref 22–32)
Calcium: 9.3 mg/dL (ref 8.9–10.3)
Chloride: 101 mmol/L (ref 98–111)
Creatinine, Ser: 0.82 mg/dL (ref 0.44–1.00)
GFR, Estimated: >60 mL/min (ref >60)
Glucose, Bld: 144 mg/dL — ABNORMAL HIGH (ref 70–99)
Potassium: 4 mmol/L (ref 3.5–5.1)
Sodium: 139 mmol/L (ref 135–145)

## 2023-03-03 MED ORDER — FARXIGA 10 MG PO TABS: 10.0000 mg | ORAL_TABLET | Freq: Every day | ORAL | 3 refills | Status: DC

## 2023-03-03 MED ORDER — TORSEMIDE 10 MG PO TABS: 10.0000 mg | ORAL_TABLET | ORAL | Status: DC

## 2023-03-03 NOTE — Progress Notes (Signed)
PCP: Joycelyn Rua, MD Cardiology: Dr. Odis Hollingshead HF Cardiology: Dr. Shirlee Latch  77 y.o. with history of aortic stenosis s/p TAVR, post-TAVR CHB with PPM, paroxysmal atrial fibrillation and chronic diastolic CHF was referred by Georgie Chard, NP, for evaluation of CHF, workup of amyloidosis.  Patient was found to have paradoxical low flow/low gradient AS and had TAVR 8/24.  She developed post-op complete heart block and had an Abbott PPM with left bundle pacing placed. In 9/24, she was found to have new atrial fibrillation. Eliquis was started and she converted spontaneously to NSR.  She had a PYP scan in 10/24 that was read as grade 2, no H/CL given. I thought that this study was equivocal.  Myeloma labs were negative.   Most recent echo in 9/24 showed EF 55%, mild LVH, mildly decreased RV systolic function, bioprosthetic AVR s/p TAVR with no significant stenosis or regurgitation.   Patient reports dyspnea with heavy housework.  No problems walking on flat ground but she gets short of breath with hills and stairs.  No chest pain.  Occasional lightheadedness with fast standing. No syncope. No BRBPR/melena. No neuropathic symptoms.   Abbott PPM interrogation: 73% v-paced, <1% AF, 2.2% a-paced.    Labs (9/24): K 4.1, creatinine 0.96 Labs (10/24): myeloma panel showed polyclonal gammopathy, urine immunofixation negative.  ECG (personally reviewed): NSR, RV-pacing  PMH: 1. Breast cancer: s/p bilateral mastectomies (1998 and then 2007 with cancer recurrence).  2. Type 2 diabetes 3. HTN 4. Severe aortic stenosis: Low flow/low gradient aortic stenosis, now s/p TAVR with 26 mm Edwards Sapien 3 THV in 8/24.  - Echo (9/24) With bioprosthetic aortic valve s/p TAVR, no significant stenosis or regurgitation.  5. Complete heart block: Post-TAVR, Abbott dual chamber PPM with left bundle lead placed in 8/24.  6. Atrial fibrillation: Paroxysmal.  First noted in 9/24.  7. Chronic diastolic CHF: LHC in 7/24 showed no  significant CAD. Echo (9/24) with EF 55%, mild LVH, mildly decreased RV systolic function, bioprosthetic AVR s/p TAVR with no significant stenosis or regurgitation.  - PYP scan (10/24) grade 2 (reviewed, seems equivocal). Myeloma panel and urine immunofixation unremarkable.   Social History   Socioeconomic History   Marital status: Married    Spouse name: Not on file   Number of children: 4   Years of education: Not on file   Highest education level: Some college, no degree  Occupational History   Occupation: Semi-retired working for [employer]  Tobacco Use   Smoking status: Former    Current packs/day: 0.00    Average packs/day: 1 pack/day for 36.0 years (36.0 ttl pk-yrs)    Types: Cigarettes    Start date: 86    Quit date: 1998    Years since quitting: 26.9   Smokeless tobacco: Never   Tobacco comments:    At first maybe 1 pack per week then it moved up to 1 ppd later.  Vaping Use   Vaping status: Never Used  Substance and Sexual Activity   Alcohol use: Yes    Comment: Brandy once in a while to help with sleep   Drug use: Never   Sexual activity: Not on file  Other Topics Concern   Not on file  Social History Narrative   Lives at home with spouse   Right handed   Caffeine: coffee or tea, 2/day       2 deceased sons , 1 son passed from heart attack   Social Determinants of Corporate investment banker  Strain: Not on file  Food Insecurity: Not on file  Transportation Needs: Not on file  Physical Activity: Not on file  Stress: Not on file  Social Connections: Not on file  Intimate Partner Violence: Not on file   FH: Son with sudden death at age 62.   ROS: All systems reviewed and negative except as per HPI.   Current Outpatient Medications  Medication Sig Dispense Refill   albuterol (VENTOLIN HFA) 108 (90 Base) MCG/ACT inhaler Inhale 2 puffs into the lungs every 4 (four) hours as needed for wheezing or shortness of breath.     apixaban (ELIQUIS) 5 MG  TABS tablet Take 1 tablet (5 mg total) by mouth 2 (two) times daily. 60 tablet 11   B Complex Vitamins (B COMPLEX PO) Take 1 capsule by mouth daily.     Cholecalciferol (VITAMIN D3) 125 MCG (5000 UT) TABS Take 5,000 Units by mouth daily.     FARXIGA 10 MG TABS tablet Take 1 tablet (10 mg total) by mouth daily before breakfast. 90 tablet 3   lisinopril-hydrochlorothiazide (ZESTORETIC) 20-12.5 MG tablet Take 1 tablet by mouth daily.     Multiple Vitamin (MULTIVITAMINS PO) Take 1 tablet by mouth daily.     naproxen sodium (ALEVE) 220 MG tablet Take 220 mg by mouth daily.     OZEMPIC, 0.25 OR 0.5 MG/DOSE, 2 MG/3ML SOPN Inject 0.5 mg into the skin every Saturday.     pantoprazole (PROTONIX) 40 MG tablet Take 40 mg by mouth daily.     polyvinyl alcohol (LIQUIFILM TEARS) 1.4 % ophthalmic solution Place 1 drop into both eyes as needed for dry eyes.     Rosuvastatin Calcium (CRESTOR PO) Take 1 tablet by mouth daily.     solifenacin (VESICARE) 5 MG tablet Take 5 mg by mouth daily.     potassium chloride SA (KLOR-CON M) 10 MEQ tablet Take 2 tablets (20 mEq total) by mouth 2 (two) times daily. (Patient not taking: Reported on 03/03/2023) 30 tablet 6   torsemide (DEMADEX) 10 MG tablet Take 1 tablet (10 mg total) by mouth every other day.     No current facility-administered medications for this encounter.   BP 110/70   Pulse 77   Wt 92.9 kg (204 lb 12.8 oz)   SpO2 94%   BMI 35.15 kg/m  General: NAD Neck: No JVD, no thyromegaly or thyroid nodule.  Lungs: Clear to auscultation bilaterally with normal respiratory effort. CV: Nondisplaced PMI.  Heart regular S1/S2, no S3/S4, 1/6 SEM RUSB.  Trace ankle edema.  No carotid bruit.  Normal pedal pulses.  Abdomen: Soft, nontender, no hepatosplenomegaly, no distention.  Skin: Intact without lesions or rashes.  Neurologic: Alert and oriented x 3.  Psych: Normal affect. Extremities: No clubbing or cyanosis.  HEENT: Normal.    Assessment/Plan: 1. Chronic  diastolic CHF: Echo in 9/24 showed EF 55%, mild LVH, mildly decreased RV systolic function, bioprosthetic AVR s/p TAVR with no significant stenosis or regurgitation. There was concern for cardiac amyloidosis given aortic stenosis, a degree of LVH, CHB, and atrial fibrillation.  PYP scan was done, I reviewed and think borderline grade 2, think equivocal.  Myeloma studies were negative.  On exam, she is not volume overloaded.  NYHA class II symptoms.  - I will arrange for cardiac MRI (if PPM compatible with MRI).  If this is not suggestive of cardiac amyloidosis, would repeat PYP scan in 6 months to look for progression but not treat yet.  - I will  send off TTR genetic testing.  We discussed the implications of this.  - Decrease torsemide to 10 mg every other day and start Farxiga 10 mg daily.  BMET/BNP today and BMET in 10 days.  2. Complete heart block: S/p TAVR, now with Abbott PPM and left bundle lead.  3. Atrial fibrillation: Paroxysmal.  She is in NSR today.  Minimal AF burden on device interrogation.  - Continue apixaban.  - Would consider AF ablation in future.  4. HTN: BP is controlled.   Followup in 2 months.   Marca Ancona 03/03/2023

## 2023-03-03 NOTE — Telephone Encounter (Signed)
Advanced Heart Failure Patient Advocate Encounter  The patient was approved for a [employer] grant that will help cover the cost of Farxiga.  Total amount awarded, $10,000.  Effective: 02/01/2023 - 01/31/2024.  BIN [id number] PCN [id number] Group [id number] ID [id number]  Pharmacy provided with approval and processing information. Patient informed in office.  Burnell Blanks, CPhT Rx Patient Advocate Phone: [phone]

## 2023-03-03 NOTE — Patient Instructions (Addendum)
Good to see you today!  DECREASE torsemide to 10 mg every other day  START Farxiga 10 mg daily  Labs done today, your results will be available in MyChart, we will contact you for abnormal readings.  Repeat labs in 10 days  Genetic test has been done, this has to be sent to be processed and can take 1-2 weeks to get results back.  We will let you know the results.   Your physician has requested that you have a cardiac MRI. Cardiac MRI uses a computer to create images of your heart as its beating, producing both still and moving pictures of your heart and major blood vessels. For further information please visit [email or web address]. Please follow the instruction sheet given to you today for more information.  Your physician recommends that you schedule a follow-up appointment in: 2 months(January 2025) Call I December to schedule an appointment    If you have any questions or concerns before your next appointment please send [place] a message through Dawn or call our office at [phone].    TO LEAVE A MESSAGE FOR THE NURSE SELECT OPTION 2, PLEASE LEAVE A MESSAGE INCLUDING: YOUR NAME DATE OF BIRTH CALL BACK NUMBER REASON FOR CALL**this is important as we prioritize the call backs  YOU WILL RECEIVE A CALL BACK THE SAME DAY AS LONG AS YOU CALL BEFORE 4:00 PM  At the [facility], you and your health needs are our priority. As part of our continuing mission to provide you with exceptional heart care, we have created designated Provider Care Teams. These Care Teams include your primary Cardiologist (physician) and Advanced Practice Providers (APPs- Physician Assistants and Nurse Practitioners) who all work together to provide you with the care you need, when you need it.   You may see any of the following providers on your designated Care Team at your next follow up: Dr Arvilla Meres Dr Marca Ancona Dr. Dorthula Nettles Dr. Clearnce Hasten Amy Filbert Schilder, NP Robbie Lis, [place] [facility] [place], [place] Brynda Peon, NP [place] Lee, NP Karle Plumber, PharmD   Please be sure to bring in all your medications bottles to every appointment.    Thank you for choosing [facility]

## 2023-03-06 ENCOUNTER — Ambulatory Visit (INDEPENDENT_AMBULATORY_CARE_PROVIDER_SITE_OTHER): Payer: Medicare HMO

## 2023-03-06 DIAGNOSIS — I442 Atrioventricular block, complete: Secondary | ICD-10-CM

## 2023-03-06 LAB — CUP PACEART REMOTE DEVICE CHECK
Battery Remaining Longevity: 124 mo
Battery Remaining Percentage: 95.5 %
Battery Voltage: 3.02 V
Brady Statistic AP VP Percent: 1.7 %
Brady Statistic AP VS Percent: 1 %
Brady Statistic AS VP Percent: 69 %
Brady Statistic AS VS Percent: 28 %
Brady Statistic RA Percent Paced: 2.2 %
Brady Statistic RV Percent Paced: 71 %
Date Time Interrogation Session: 20241122051239
Implantable Lead Connection Status: 753985
Implantable Lead Connection Status: 753985
Implantable Lead Implant Date: 20240822
Implantable Lead Implant Date: 20240822
Implantable Lead Location: 753859
Implantable Lead Location: 753860
Implantable Pulse Generator Implant Date: 20240822
Lead Channel Impedance Value: 400 Ohm
Lead Channel Impedance Value: 530 Ohm
Lead Channel Pacing Threshold Amplitude: 0.5 V
Lead Channel Pacing Threshold Amplitude: 0.875 V
Lead Channel Pacing Threshold Pulse Width: 0.5 ms
Lead Channel Pacing Threshold Pulse Width: 0.5 ms
Lead Channel Sensing Intrinsic Amplitude: 2.8 mV
Lead Channel Sensing Intrinsic Amplitude: 7.9 mV
Lead Channel Setting Pacing Amplitude: 1.125
Lead Channel Setting Pacing Amplitude: 1.5 V
Lead Channel Setting Pacing Pulse Width: 0.5 ms
Lead Channel Setting Sensing Sensitivity: 4 mV
Pulse Gen Model: 2272
Pulse Gen Serial Number: 8206065

## 2023-03-16 ENCOUNTER — Ambulatory Visit (HOSPITAL_COMMUNITY)
Admission: RE | Admit: 2023-03-16 | Discharge: 2023-03-16 | Disposition: A | Discharge status: Home / Self Care | Payer: Medicare HMO | Source: Ambulatory Visit | Attending: Cardiology | Admitting: Cardiology

## 2023-03-16 DIAGNOSIS — I5033 Acute on chronic diastolic (congestive) heart failure: Secondary | ICD-10-CM | POA: Insufficient documentation

## 2023-03-16 LAB — BASIC METABOLIC PANEL
Anion gap: 10 (ref 5–15)
BUN: 22 mg/dL (ref 8–23)
CO2: 27 mmol/L (ref 22–32)
Calcium: 9.8 mg/dL (ref 8.9–10.3)
Chloride: 102 mmol/L (ref 98–111)
Creatinine, Ser: 0.92 mg/dL (ref 0.44–1.00)
GFR, Estimated: >60 mL/min (ref >60)
Glucose, Bld: 125 mg/dL — ABNORMAL HIGH (ref 70–99)
Potassium: 4.2 mmol/L (ref 3.5–5.1)
Sodium: 139 mmol/L (ref 135–145)

## 2023-03-25 NOTE — Progress Notes (Signed)
Remote pacemaker transmission.   

## 2023-03-31 ENCOUNTER — Encounter: Payer: Medicare HMO | Admitting: Cardiology

## 2023-04-13 ENCOUNTER — Telehealth: Payer: Self-pay

## 2023-04-13 NOTE — Progress Notes (Signed)
 Electrophysiology Office Follow up Visit Note:    Date:  04/14/2023   ID:  [name], DOB November 06, 1945, MRN [id number]  PCP:  Nanci Senior, MD  [facility] Cardiologist:  Madonna Large, DO  [facility] Electrophysiologist:  OLE ONEIDA HOLTS, MD    Interval History:     [name] is a 77 y.o. female who presents for a follow up visit.   She had a permanent pacemaker implanted December 04, 2022 for complete heart block following TAVR. Since implant remote interrogations have shown stable device function. She did have a PYP amyloid scan in October that was consistent with cardiac ATTR amyloidosis.  She saw Dr. Aleta in the heart failure clinic March 03, 2023.  Dr. Rolan felt that the scan was equivocal and recommended a cardiac MRI to further investigate.  She is well today.  She is with her husband today in clinic.  She has been off her Eliquis  for several weeks because of excessive costs.  She is interested in stroke risk mitigation strategies that avoid long-term exposure anticoagulation       Past medical, surgical, social and family history were reviewed.  ROS:   Please see the history of present illness.    All other systems reviewed and are negative.  EKGs/Labs/Other Studies Reviewed:    The following studies were reviewed today:  April 14, 2023 in clinic device interrogation personally reviewed Battery and lead parameter stable.  I reviewed her July CT scan and her left atrial appendage appears suitable for closure       Physical Exam:    VS:  BP (!) 112/58 (BP Location: Left Arm, Patient Position: Sitting)   Pulse 72   Ht 5' 4 (1.626 m)   Wt 198 lb 6.4 oz (90 kg)   SpO2 96%   BMI 34.06 kg/m     Wt Readings from Last 3 Encounters:  04/14/23 198 lb 6.4 oz (90 kg)  03/03/23 204 lb 12.8 oz (92.9 kg)  02/05/23 206 lb (93.4 kg)     GEN: no distress CARD: RRR, No MRG.  Pacemaker pocket well-healed RESP: No IWOB. CTAB.       ASSESSMENT:    1. Complete heart block (HCC)   2. Pacemaker   3. Paroxysmal A-fib (HCC)   4. Chronic diastolic heart failure (HCC)    PLAN:    In order of problems listed above:  #Complete heart block #Permanent pacemaker in situ Doing well after pacemaker implant.  Continue remote monitoring.  #Atrial fibrillation Continue Eliquis  for stroke prophylaxis.  She has missed several weeks of the medication due to excessive cost.  I discussed left atrial appendage occlusion during today's visit and she wishes to proceed with scheduling.  She will not need a CT scan prior to the procedure.  -------  I have seen [name] in the office today who is being considered for a Watchman left atrial appendage closure device. I believe they will benefit from this procedure given their history of atrial fibrillation, CHA2DS2-VASc score of 5 and unadjusted ischemic stroke rate of 7.2% per year. Unfortunately, the patient is not felt to be a long term anticoagulation candidate secondary to excessive costs leading to missed doses. The patient's chart has been reviewed and I feel that they would be a candidate for short term oral anticoagulation after Watchman implant.   It is my belief that after undergoing a LAA closure procedure, [name] will not need long term anticoagulation which eliminates  anticoagulation side effects and major bleeding risk.   Procedural risks for the Watchman implant have been reviewed with the patient including a 0.5% risk of stroke, <1% risk of perforation and <1% risk of device embolization. Other risks include bleeding, vascular damage, tamponade, worsening renal function, and death. The patient understands these risk and wishes to proceed.     The published clinical data on the safety and effectiveness of WATCHMAN include but are not limited to the following: - Holmes DR, Jess BEARD, Sick P et al. for the PROTECT AF Investigators. Percutaneous closure of the left  atrial appendage versus warfarin therapy for prevention of stroke in patients with atrial fibrillation: a randomised non-inferiority trial. Lancet 2009; 374: 534-42. GLENWOOD Jess BEARD, Doshi SK, Jonita VEAR Satchel D et al. on behalf of the PROTECT AF Investigators. Percutaneous Left Atrial Appendage Closure for Stroke Prophylaxis in Patients With Atrial Fibrillation 2.3-Year Follow-up of the PROTECT AF (Watchman Left Atrial Appendage System for Embolic Protection in Patients With Atrial Fibrillation) Trial. Circulation 2013; 127:720-729. - Alli O, Doshi S,  Kar S, Reddy VY, Sievert H et al. Quality of Life Assessment in the Randomized PROTECT AF (Percutaneous Closure of the Left Atrial Appendage Versus Warfarin Therapy for Prevention of Stroke in Patients With Atrial Fibrillation) Trial of Patients at Risk for Stroke With Nonvalvular Atrial Fibrillation. J Am Coll Cardiol 2013; 61:1790-8. GLENWOOD Satchel DR, Archer RAMAN, Price M, Whisenant B, Sievert H, Doshi S, Huber K, Reddy V. Prospective randomized evaluation of the Watchman left atrial appendage Device in patients with atrial fibrillation versus long-term warfarin therapy; the PREVAIL trial. Journal of the [employer] of Cardiology, Vol. 4, No. 1, 2014, 1-11. - Kar S, Doshi SK, Sadhu A, Horton R, Osorio J et al. Primary outcome evaluation of a next-generation left atrial appendage closure device: results from the PINNACLE FLX trial. Circulation 2021;143(18)1754-1762.    After today's visit with the patient which was dedicated solely for shared decision making visit regarding LAA closure device, the patient decided to proceed with the LAA appendage closure procedure scheduled to be done in the near future at [facility]. Prior to the procedure, I would like to obtain a gated CT scan of the chest with contrast timed for PV/LA visualization.    HAS-BLED score 2 Hypertension Yes  Abnormal renal and liver function (Dialysis, transplant, Cr >2.26 mg/dL  /Cirrhosis or Bilirubin >2x Normal or AST/ALT/AP >3x Normal) No  Stroke No  Bleeding No  Labile INR (Unstable/high INR) No  Elderly (>65) Yes  Drugs or alcohol (>= 8 drinks/week, anti-plt or NSAID) No   CHA2DS2-VASc Score = 5  The patient's score is based upon: CHF History: 0 HTN History: 1 Diabetes History: 1 Stroke History: 0 Vascular Disease History: 0 Age Score: 2 Gender Score: 1     #Chronic diastolic heart failure Follows with Dr. Rolan in the heart failure clinic.  Possible diagnosis of amyloid.   Signed, Ole Holts, MD, [facility], [facility] 04/14/2023 8:55 AM    Electrophysiology [facility]

## 2023-04-13 NOTE — Telephone Encounter (Signed)
-----   Message from [facility] sent at 03/25/2023 11:09 AM EST ----- Regarding: FW: PA Scheduled 1/27 ----- Message ----- From: Linda Hedges, RN Sent: 03/03/2023  10:41 AM EST To: Modesta Messing, CMA Subject: PA                                              Test: mri  Insurance: [employer] medicare  CPT:  if known  Location: [facility]  Dx: Amyloid  Provider: Shirlee Latch  Scheduled Date:  if known

## 2023-04-14 ENCOUNTER — Ambulatory Visit: Payer: Medicare HMO | Attending: Cardiology | Admitting: Cardiology

## 2023-04-14 ENCOUNTER — Encounter: Payer: Self-pay | Admitting: Cardiology

## 2023-04-14 VITALS — BP 112/58 | HR 72 | Ht 64.0 in | Wt 198.4 lb

## 2023-04-14 DIAGNOSIS — I442 Atrioventricular block, complete: Secondary | ICD-10-CM

## 2023-04-14 DIAGNOSIS — I5032 Chronic diastolic (congestive) heart failure: Secondary | ICD-10-CM

## 2023-04-14 DIAGNOSIS — I480 Paroxysmal atrial fibrillation: Secondary | ICD-10-CM | POA: Diagnosis not present

## 2023-04-14 DIAGNOSIS — Z950 Presence of cardiac pacemaker: Secondary | ICD-10-CM

## 2023-04-14 LAB — CUP PACEART INCLINIC DEVICE CHECK
Battery Remaining Longevity: 124 mo
Battery Voltage: 3.01 V
Brady Statistic RA Percent Paced: 2.3 %
Brady Statistic RV Percent Paced: 75 %
Date Time Interrogation Session: 20241231101241
Implantable Lead Connection Status: 753985
Implantable Lead Connection Status: 753985
Implantable Lead Implant Date: 20240822
Implantable Lead Implant Date: 20240822
Implantable Lead Location: 753859
Implantable Lead Location: 753860
Implantable Pulse Generator Implant Date: 20240822
Lead Channel Impedance Value: 410 Ohm
Lead Channel Impedance Value: 412.5 Ohm
Lead Channel Impedance Value: 510 Ohm
Lead Channel Impedance Value: 512.5 Ohm
Lead Channel Pacing Threshold Amplitude: 0.75 V
Lead Channel Pacing Threshold Amplitude: 0.75 V
Lead Channel Pacing Threshold Amplitude: 0.75 V
Lead Channel Pacing Threshold Amplitude: 1 V
Lead Channel Pacing Threshold Pulse Width: 0.5 ms
Lead Channel Pacing Threshold Pulse Width: 0.5 ms
Lead Channel Pacing Threshold Pulse Width: 0.5 ms
Lead Channel Pacing Threshold Pulse Width: 0.5 ms
Lead Channel Sensing Intrinsic Amplitude: 3.3 mV
Lead Channel Sensing Intrinsic Amplitude: 8.4 mV
Lead Channel Setting Pacing Amplitude: 1 V
Lead Channel Setting Pacing Amplitude: 1.625
Lead Channel Setting Pacing Pulse Width: 0.5 ms
Lead Channel Setting Sensing Sensitivity: 4 mV
Pulse Gen Model: 2272
Pulse Gen Serial Number: 8206065

## 2023-04-14 NOTE — Patient Instructions (Addendum)
Medication Instructions:  Your physician recommends that you continue on your current medications as directed. Please refer to the Current Medication list given to you today.  *If you need a refill on your cardiac medications before your next appointment, please call your pharmacy*   Tests/Procedures Your physician has requested that you have Left atrial appendage (LAA) closure device implantation is a procedure to put a small device in the LAA of the heart. The LAA is a small sac in the wall of the heart's left upper chamber. Blood clots can form in this area. The device, Watchman closes the LAA to help prevent a blood clot and stroke.   Follow-Up: At [facility], you and your health needs are our priority.  As part of our continuing mission to provide you with exceptional heart care, we have created designated Provider Care Teams.  These Care Teams include your primary Cardiologist (physician) and Advanced Practice Providers (APPs -  Physician Assistants and Nurse Practitioners) who all work together to provide you with the care you need, when you need it.  Your next appointment:   You will be contacted by Nurse Navigator, Karsten Fells to schedule your pre-procedure visit and procedure date. If you have any questions she can be reached at [phone].

## 2023-04-16 ENCOUNTER — Telehealth: Payer: Self-pay

## 2023-04-16 NOTE — Telephone Encounter (Signed)
 Received voicemail from the patient to call back.  She reported she was in atrial fibrillation this morning and requests a call from the Device Clinic to see if settings were changed at her visit on 12/31. Will route to the Device Clinic to follow-up tomorrow.   While on the phone, informed the patient that she will be called when the Spring schedule is out to schedule her Watchman procedure. She was grateful for call.

## 2023-04-16 NOTE — Telephone Encounter (Signed)
 Marland Kitchen

## 2023-04-17 NOTE — Telephone Encounter (Signed)
 Called patient to send manual transmission.   Transmission reviewed and shows normal device function. No episodes recorded. Patient advised to monitor symptoms and call if she has new or worsening symptoms. Pt voiced understanding and agreeable to plan.

## 2023-04-20 DIAGNOSIS — S46212A Strain of muscle, fascia and tendon of other parts of biceps, left arm, initial encounter: Secondary | ICD-10-CM | POA: Diagnosis not present

## 2023-04-20 DIAGNOSIS — I5032 Chronic diastolic (congestive) heart failure: Secondary | ICD-10-CM | POA: Diagnosis not present

## 2023-04-20 DIAGNOSIS — I4891 Unspecified atrial fibrillation: Secondary | ICD-10-CM | POA: Diagnosis not present

## 2023-04-20 DIAGNOSIS — E1159 Type 2 diabetes mellitus with other circulatory complications: Secondary | ICD-10-CM | POA: Diagnosis not present

## 2023-04-30 ENCOUNTER — Telehealth: Payer: Self-pay

## 2023-04-30 ENCOUNTER — Ambulatory Visit: Payer: Medicare HMO | Attending: Cardiology | Admitting: Cardiology

## 2023-04-30 ENCOUNTER — Encounter: Payer: Self-pay | Admitting: Cardiology

## 2023-04-30 VITALS — BP 116/66 | HR 78 | Resp 16 | Ht 64.0 in | Wt 201.0 lb

## 2023-04-30 DIAGNOSIS — Z952 Presence of prosthetic heart valve: Secondary | ICD-10-CM | POA: Diagnosis not present

## 2023-04-30 DIAGNOSIS — E119 Type 2 diabetes mellitus without complications: Secondary | ICD-10-CM

## 2023-04-30 DIAGNOSIS — I480 Paroxysmal atrial fibrillation: Secondary | ICD-10-CM | POA: Insufficient documentation

## 2023-04-30 DIAGNOSIS — I350 Nonrheumatic aortic (valve) stenosis: Secondary | ICD-10-CM | POA: Diagnosis not present

## 2023-04-30 DIAGNOSIS — I10 Essential (primary) hypertension: Secondary | ICD-10-CM

## 2023-04-30 DIAGNOSIS — Z7901 Long term (current) use of anticoagulants: Secondary | ICD-10-CM

## 2023-04-30 DIAGNOSIS — I442 Atrioventricular block, complete: Secondary | ICD-10-CM | POA: Diagnosis not present

## 2023-04-30 DIAGNOSIS — I5032 Chronic diastolic (congestive) heart failure: Secondary | ICD-10-CM | POA: Diagnosis not present

## 2023-04-30 DIAGNOSIS — Z950 Presence of cardiac pacemaker: Secondary | ICD-10-CM

## 2023-04-30 MED ORDER — APIXABAN 5 MG PO TABS: 5.0000 mg | ORAL_TABLET | Freq: Two times a day (BID) | ORAL | 0 refills | Status: DC

## 2023-04-30 NOTE — Patient Instructions (Addendum)
Medication Instructions:   START Eliquis 5 mg twice daily - 2 weeks worth of samples given today  We are referring you to our Coumadin Clinical at our [place] location.  Your physician recommends that you continue on your current medications as directed. Please refer to the Current Medication list given to you today.  *If you need a refill on your cardiac medications before your next appointment, please call your pharmacy*  Follow-Up: At [facility], you and your health needs are our priority.  As part of our continuing mission to provide you with exceptional heart care, we have created designated Provider Care Teams.  These Care Teams include your primary Cardiologist (physician) and Advanced Practice Providers (APPs -  Physician Assistants and Nurse Practitioners) who all work together to provide you with the care you need, when you need it.  We recommend signing up for the patient portal called "MyChart".  Sign up information is provided on this After Visit Summary.  MyChart is used to connect with patients for Virtual Visits (Telemedicine).  Patients are able to view lab/test results, encounter notes, upcoming appointments, etc.  Non-urgent messages can be sent to your provider as well.   To learn more about what you can do with MyChart, go to [email or web address].    Your next appointment:   6 month(s)  The format for your next appointment:   In Person  Provider:   Tessa Lerner, DO {  Other Instructions   1st Floor: - Lobby - Registration  - Pharmacy  - Lab - Cafe  2nd Floor: - PV Lab - Diagnostic Testing (echo, CT, nuclear med)  3rd Floor: - Vacant  4th Floor: - TCTS (cardiothoracic surgery) - AFib Clinic - Structural Heart Clinic - Vascular Surgery  - Vascular Ultrasound  5th Floor: - HeartCare Cardiology (general and EP) - Clinical Pharmacy for coumadin, hypertension, lipid, weight-loss medications, and med management appointments    Valet parking  services will be available as well.

## 2023-04-30 NOTE — Telephone Encounter (Signed)
Received anticoagulation enrollment notification for pt. Reached out to [facility], RN since office visit note states pt was started on Eliquis and 2 weeks of Eliquis samples were given to pt today.   Per RN:  "Dr. Odis Hollingshead wanted to go ahead and place an order for a referral because she has been off of Eliquis for a month d/t financial reasons. We gave her PA forms for Eliquis but are not sure if she will be approved or if the copay will be low enough for them to afford so our next step would be to switch to warfarin"   Will forward to pharmacy team to see if there is an estimated cost for Eliquis.

## 2023-04-30 NOTE — Progress Notes (Signed)
Cardiology Office Note:  .   Date:  04/30/2023  ID:  [name], DOB 1945-11-01, MRN [id number] PCP:  Joycelyn Rua, MD  [facility] Providers Cardiologist:  Tessa Lerner, [facility] (established care 09/04/2022)  Electrophysiologist:  Lanier Prude, MD ,  Click to update primary MD,subspecialty MD or APP then REFRESH:1}    History of Present Illness: .   [name] is a 78 y.o. Caucasian female whose past medical history and cardiovascular risk factors includes: Aortic stenosis s/p 26 mm Edwards SAPIEN 3 via TF approach, status post pacemaker implant due to complete heart block (August 2024) with Dr. Lalla Brothers), paroxysmal atrial fibrillation, diabetes mellitus type 2, HTN, former smoker, hx breast cancer x2 (status postmastectomy, radiation, chemotherapy), history of ventricular tachycardia.   Referred to the practice for evaluation of shortness of breath.  She was noted to have aortic stenosis and she underwent 26 mm Edwards SAPIEN TAVR valve implant on December 10, 2022 with Dr. Lynnette Caffey.   Post TAVR workup she was also noted to have complete heart block underwent pacemaker implant with Dr. Lalla Brothers and is enrolled into the pacemaker clinic.  She is also noted to have paroxysmal atrial fibrillation after the device implant and was placed on anticoagulation for thromboembolic prophylaxis.  Since last office visit patient had a PYP study in October 2024 and the study was concerning for cardiac ATTR amyloidosis.  She was seen by advanced heart failure, Dr. Shirlee Latch, who felt that scan was equivocal.  Patient was recommended to have a cardiac MRI for further evaluation (scheduled for later this month) and TTR genetic testing.  During her follow-up appointment visits with EP patient voiced concerns with regards to Eliquis being cost prohibitive and is currently being considered for watchman implant to reduce risk of thromboembolic events.  Patient presents today for follow-up and is  accompanied by her husband.  Patient denies any anginal chest pain or heart failure symptoms.  Patient states that she has stopped taking Eliquis for the last 4 weeks at least secondary to it being cost prohibitive.  Patient understands that not being on anticoagulation increases her chances of having a stroke given her history of atrial fibrillation and high CHA2DS2-VASc score.  She is currently being worked up for [employer] implant as well.  Review of Systems: .   Review of Systems  Constitutional: Positive for malaise/fatigue.  Cardiovascular:  Negative for chest pain, claudication, dyspnea on exertion, irregular heartbeat, leg swelling, near-syncope, orthopnea, palpitations, paroxysmal nocturnal dyspnea and syncope.  Respiratory:  Negative for shortness of breath.   Hematologic/Lymphatic: Negative for bleeding problem.  Musculoskeletal:  Negative for muscle cramps and myalgias.  Neurological:  Negative for dizziness and light-headedness.    Studies Reviewed:   EKG: EKG Interpretation Date/Time:  Thursday April 30 2023 09:48:21 EST Ventricular Rate:  78 PR Interval:  226 QRS Duration:  114 QT Interval:  414 QTC Calculation: 471 R Axis:   -72  Text Interpretation: Sinus rhythm with 1st degree A-V block Left axis deviation Incomplete right bundle branch block Consider Anteroseptal infarct (cited on or before 30-Apr-2023) When compared with ECG of 14-Apr-2023 08:42, Sinus rhythm has replaced Electronic atrial pacemaker Confirmed by Tessa Lerner [id number]) on 04/30/2023 9:50:24 AM  Echocardiogram: 01/12/2023 (finger print)  1. Left ventricular ejection fraction, by estimation, is 55%. The left  ventricle has normal function. Left ventricular endocardial border not  optimally defined to evaluate regional wall motion. There is mild  concentric left ventricular hypertrophy. Left   ventricular  diastolic parameters are consistent with Grade I diastolic  dysfunction (impaired relaxation).    2. Right ventricular systolic function is mildly reduced. The right  ventricular size is normal. There is normal pulmonary artery systolic  pressure. The estimated right ventricular systolic pressure is 27.0 mmHg.   3. Left atrial size was mildly dilated.   4. The mitral valve is normal in structure. Trivial mitral valve  regurgitation. No evidence of mitral stenosis.   5. Bioprosthetic aortic valve s/p TAVR with 26 mm Edwards Sapien THV.  Very poor images, mean gradient 4 mmHg is likely inaccurate. No  peri-valvular leakage noted.   6. The inferior vena cava is dilated in size with >50% respiratory  variability, suggesting right atrial pressure of 8 mmHg.   7. Technically difficult study with very poor images. No apical windows.   Stress Testing: Regadenoson Nuclear stress test 09/12/2022: There is a moderate sized moderate reversible defect in the lateral, inferior and infero-apical regions.  Overall LV systolic function is abnormal with regional wall motion abnormalities in the same distribution (RCA) Stress LV EF: 46%.  Nondiagnostic ECG stress. The heart rate response was consistent with Regadenoson.  No previous exam available for comparison. Intermediate risk study.   CCTA:  July 2023 1. Annular measurements support a 26 mm S3 or 29 mm Evolut Pro.   2. There is moderate subannular calcium under the LCC extending to the AMVL.   3. Sufficient coronary to annulus distance.   4. Optimal Fluoroscopic Angle for Delivery: LAO 17 CRA 2   5. Dilated pulmonary artery suggestive of pulmonary hypertension.  Heart Catheterization: 10/14/2022 LM: Normal. No significant disease LAD: Normal. No significant disease Lcx: Dominant. No significant disease RCA: Nondominant. No significant disease   RA: 8 mmHg RV: 49/6 mmHg PA:46/25 mmHg, mPAP 31 mmHg PCW: 17 mmHg   CO: 5.3 L.min CI: 2.6 L/min/m2   Simultaneous LV-AO measurement using Langston cathteer Aortic valve mean PG 25 mmHg,  AVA 0.94 cm2   Conclusion: No significant coronary artery disease Mildly elevated filling pressures Mild PH, WHO Grp II Aortic stenosis, moderate by gradients, severe by area. In the correct context, aortic stenosis could possibly be paradoxically low flow low gradient. Alternatively, dyspnea could be due to mild PH, possible diastolic dysfunction. Recommend clinical correlation.   Carotid duplex: Carotid artery duplex 09/22/2022 Duplex suggests stenosis in the right internal carotid artery (minimal). Duplex suggests stenosis in the left internal carotid artery (1-15%). Both vertebral arteries are patent with antegrade flow. No prior studies for comparison. Follow up in one year is appropriate if clinically indicated.   Risk Assessment/Calculations:   Click Here to Calculate/Change CHADS2VASc Score The patient's CHADS2-VASc score is 6, indicating a 9.7% annual risk of stroke. CHF History: Yes HTN History: Yes Diabetes History: Yes Stroke History: No Vascular Disease History: No  Labs:       Latest Ref Rng & Units 03/03/2023   10:19 AM 01/06/2023    1:55 PM 01/02/2023   11:49 AM  CBC  WBC 4.0 - 10.5 K/uL 6.6  5.8  6.1   Hemoglobin 12.0 - 15.0 g/dL 16.1  09.6  04.5   Hematocrit 36.0 - 46.0 % 41.8  40.3  41.8   Platelets 150 - 400 K/uL 178  178  169        Latest Ref Rng & Units 03/16/2023   10:58 AM 03/03/2023   10:19 AM 01/06/2023    1:55 PM  BMP  Glucose 70 - 99 mg/dL 409  144  128   BUN 8 - 23 mg/dL 22  21  19    Creatinine 0.44 - 1.00 mg/dL 1.61  0.96  0.45   Sodium 135 - 145 mmol/L 139  139  138   Potassium 3.5 - 5.1 mmol/L 4.2  4.0  4.1   Chloride 98 - 111 mmol/L 102  101  102   CO2 22 - 32 mmol/L 27  29  28    Calcium 8.9 - 10.3 mg/dL 9.8  9.3  9.1       Latest Ref Rng & Units 03/16/2023   10:58 AM 03/03/2023   10:19 AM 02/12/2023   11:52 AM  CMP  Glucose 70 - 99 mg/dL 409  811    BUN 8 - 23 mg/dL 22  21    Creatinine 9.14 - 1.00 mg/dL 7.82  9.56     Sodium 213 - 145 mmol/L 139  139    Potassium 3.5 - 5.1 mmol/L 4.2  4.0    Chloride 98 - 111 mmol/L 102  101    CO2 22 - 32 mmol/L 27  29    Calcium 8.9 - 10.3 mg/dL 9.8  9.3    Total Protein 6.0 - 8.5 g/dL   6.8     No results found for: "CHOL", "HDL", "LDLCALC", "LDLDIRECT", "TRIG", "CHOLHDL" No results for input(s): "LIPOA" in the last 8760 hours. No components found for: "NTPROBNP" No results for input(s): "PROBNP" in the last 8760 hours. Recent Labs    12/04/22 0931  TSH 3.059    Physical Exam:    Today's Vitals   04/30/23 0942  BP: 116/66  Pulse: 78  Resp: 16  SpO2: 97%  Weight: 201 lb (91.2 kg)  Height: 5\' 4"  (1.626 m)   Body mass index is 34.5 kg/m. Wt Readings from Last 3 Encounters:  04/30/23 201 lb (91.2 kg)  04/14/23 198 lb 6.4 oz (90 kg)  03/03/23 204 lb 12.8 oz (92.9 kg)    Physical Exam  Constitutional: No distress.  Appears older than stated age, ambulates with a cane,, hemodynamically stable.   Neck: No JVD present.  Cardiovascular: Normal rate, regular rhythm, S1 normal, S2 normal, intact distal pulses and normal pulses. Exam reveals no gallop, no S3 and no S4.  No murmur heard. Pulses:      Carotid pulses are  on the right side with bruit and  on the left side with bruit. Pulmonary/Chest: Effort normal and breath sounds normal. No stridor. She has no wheezes. She has no rales.  Bilateral mastectomies, left sided pacemaker.   Abdominal: Soft. Bowel sounds are normal. She exhibits no distension. There is no abdominal tenderness.  Musculoskeletal:        General: Edema (improved) present. No tenderness.     Cervical back: Neck supple.  Neurological: She is alert and oriented to person, place, and time. She has intact cranial nerves (2-12).  Skin: Skin is warm and moist.     Impression & Recommendation(s):  Impression:   ICD-10-CM   1. Paroxysmal A-fib (HCC)  I48.0 EKG 12-Lead    Ambulatory referral to Anticoagulation Monitoring    2. Long  term (current) use of anticoagulants  Z79.01     3. Nonrheumatic aortic valve stenosis  I35.0     4. S/P TAVR (transcatheter aortic valve replacement)  Z95.2     5. Complete heart block (HCC)  I44.2     6. Pacemaker  Z95.0     7. Chronic diastolic heart failure (  HCC)  I50.32     8. Diabetes mellitus without complication (HCC)  E11.9     9. Essential hypertension  I10        Recommendation(s):  Paroxysmal A-fib (HCC) Long term (current) use of anticoagulants Remains in sinus rhythm as per today's EKG. Given her CHA2DS2-VASc SCORE anticoagulation is recommended to decrease her risk of strokes.  She has been on Eliquis but the medication still remains cost prohibitive. Her last dose of Eliquis was approximately 4 weeks ago, according to the patient. She is currently being worked up for left atrial occlusion device, I.e. Watchman-likely spring 2025. For now we will give her samples for Eliquis.  Patient assistance form provided and she understands the need to be filled and sent to see if she can qualify for medication assistance to help cover the cost.  In the interim, I will also send a prescription for Eliquis to [facility] since is the beginning of the new year and her coverage may have changed.  If DOACs are not an option due to high cost recommended that she starts Coumadin to help reduce her thromboembolic risk.  Will refer her to Coumadin clinic and she is advised to establish care if DOACs are not an option due to cost.    Patient is aware that she only needs to be on 1 agent either a DOAC or Coumadin and not both.  Risks, benefits, and alternatives to anticoagulation discussed.  Coumadin profile discussed and she is aware of INR checks.  Nonrheumatic aortic valve stenosis S/P TAVR (transcatheter aortic valve replacement) December 02, 2022:  Edwards Sapien 3 Ultra Resilia THV (size 26 mm, model # 9755RSL, serial # [id number])  Fingerprint echo January 12 2023 Denies  anginal chest pain or heart failure symptoms. Is aware of antibiotic prophylaxis.  Complete heart block [facility]) Pacemaker Implanted in August 2024 with Dr. Lalla Brothers, dual-chamber pacemaker. Currently enrolled in the pacemaker clinic. Last pacemaker interrogation report reviewed during today's office visit.  Chronic diastolic heart failure (HCC) Currently euvolemic. Currently on lisinopril/hydrochlorothiazide 20/12.5 mg p.o. daily. Currently on Farxiga 10 mg p.o. daily. Currently on torsemide 10 mg p.o. every other day Reemphasized importance of low-salt diet. Strict I's and O's and daily weights Currently being worked up for cardiac amyloidosis.  Scheduled for cardiac MRI later this month.  And also has established care with Dr. Shirlee Latch with advanced heart failure.  Diabetes mellitus without complication (HCC) Reemphasized importance of glycemic control. Currently on Ozempic, lisinopril Recommend statin therapy-patient to discuss with PCP  Essential hypertension Office blood pressures are well-controlled. Medications as discussed above  Orders Placed:  Orders Placed This Encounter  Procedures   Ambulatory referral to Anticoagulation Monitoring    Referral Priority:   Routine    Referral Type:   Consultation    Referral Reason:   Specialty Services Required    Number of Visits Requested:   1   EKG 12-Lead    Final Medication List:    Meds ordered this encounter  Medications   apixaban (ELIQUIS) 5 MG TABS tablet    Sig: Take 1 tablet (5 mg total) by mouth 2 (two) times daily.    Dispense:  28 tablet    Refill:  0    Lot Number?:   [id number]    Expiration Date?:   04/14/2024    There are no discontinued medications.   Current Outpatient Medications:    albuterol (VENTOLIN HFA) 108 (90 Base) MCG/ACT inhaler, Inhale 2 puffs into the lungs  every 4 (four) hours as needed for wheezing or shortness of breath., Disp: , Rfl:    apixaban (ELIQUIS) 5 MG TABS tablet, Take 1 tablet (5  mg total) by mouth 2 (two) times daily., Disp: 28 tablet, Rfl: 0   B Complex Vitamins (B COMPLEX PO), Take 1 capsule by mouth daily., Disp: , Rfl:    Cholecalciferol (VITAMIN D3) 125 MCG (5000 UT) TABS, Take 5,000 Units by mouth daily., Disp: , Rfl:    FARXIGA 10 MG TABS tablet, Take 1 tablet (10 mg total) by mouth daily before breakfast., Disp: 90 tablet, Rfl: 3   lisinopril-hydrochlorothiazide (ZESTORETIC) 20-12.5 MG tablet, Take 1 tablet by mouth daily., Disp: , Rfl:    Multiple Vitamin (MULTIVITAMINS PO), Take 1 tablet by mouth daily., Disp: , Rfl:    naproxen sodium (ALEVE) 220 MG tablet, Take 220 mg by mouth daily., Disp: , Rfl:    OZEMPIC, 0.25 OR 0.5 MG/DOSE, 2 MG/3ML SOPN, Inject 0.5 mg into the skin every Saturday., Disp: , Rfl:    pantoprazole (PROTONIX) 40 MG tablet, Take 40 mg by mouth daily., Disp: , Rfl:    polyvinyl alcohol (LIQUIFILM TEARS) 1.4 % ophthalmic solution, Place 1 drop into both eyes as needed for dry eyes., Disp: , Rfl:    potassium chloride SA (KLOR-CON M) 10 MEQ tablet, Take 2 tablets (20 mEq total) by mouth 2 (two) times daily., Disp: 30 tablet, Rfl: 6   Rosuvastatin Calcium (CRESTOR PO), Take 1 tablet by mouth daily., Disp: , Rfl:    solifenacin (VESICARE) 5 MG tablet, Take 5 mg by mouth daily., Disp: , Rfl:    torsemide (DEMADEX) 10 MG tablet, Take 1 tablet (10 mg total) by mouth every other day., Disp: , Rfl:    apixaban (ELIQUIS) 5 MG TABS tablet, Take 1 tablet (5 mg total) by mouth 2 (two) times daily. (Patient not taking: Reported on 04/30/2023), Disp: 60 tablet, Rfl: 11  Consent:   NA  Disposition:   6 months sooner if needed.  Her questions and concerns were addressed to her satisfaction. She voices understanding of the recommendations provided during this encounter.    Signed, Tessa Lerner, DO, [facility]  [facility]  [address] [place], [place] [address] [phone] 04/30/2023 11:14 AM

## 2023-05-01 ENCOUNTER — Other Ambulatory Visit (HOSPITAL_COMMUNITY): Payer: Self-pay

## 2023-05-01 ENCOUNTER — Telehealth: Payer: Self-pay | Admitting: Pharmacy Technician

## 2023-05-01 ENCOUNTER — Other Ambulatory Visit: Payer: Self-pay

## 2023-05-01 DIAGNOSIS — I480 Paroxysmal atrial fibrillation: Secondary | ICD-10-CM

## 2023-05-01 MED ORDER — APIXABAN 5 MG PO TABS: 5.0000 mg | ORAL_TABLET | Freq: Two times a day (BID) | ORAL | 5 refills | Status: DC

## 2023-05-01 NOTE — Telephone Encounter (Signed)
Pharmacy Patient Advocate Encounter  Received notification from [facility] that Prior Authorization for eliquis has been APPROVED from 04/15/23 to 04/12/24. Ran test claim, Copay is $297.00 one month (DEDUCTIBLE). This test claim was processed through [facility]- copay amounts may vary at other pharmacies due to pharmacy/plan contracts, or as the patient moves through the different stages of their insurance plan.   PA #/Case ID/Reference #: [id number]

## 2023-05-01 NOTE — Telephone Encounter (Signed)
Pharmacy Patient Advocate Encounter   Received notification from Pt Calls Messages that prior authorization for Eliquis is required/requested.   Insurance verification completed.   The patient is insured through [employer] .   Per test claim: PA required; PA submitted to above mentioned insurance via CoverMyMeds Key/confirmation #/EOC [id number] Status is pending

## 2023-05-04 NOTE — Telephone Encounter (Signed)
Did the patient get approved for patient assistance?   @Melissa  -this cost is with her current insurance? Did you talk to the patient if she can afford the medications?  Nevaya Nagele [place], DO, [facility]

## 2023-05-04 NOTE — Telephone Encounter (Signed)
She has a $250 deducible. First fill will be $297 and then every other month will be $47. Max out of pocket for 2025 is $2,000

## 2023-05-05 MED ORDER — APIXABAN 5 MG PO TABS: 5.0000 mg | ORAL_TABLET | Freq: Two times a day (BID) | ORAL | 1 refills | Status: DC

## 2023-05-05 NOTE — Telephone Encounter (Signed)
Thank-you for feedback.   Lillyen Schow [place], DO, [facility]

## 2023-05-05 NOTE — Telephone Encounter (Signed)
Spoke with patient. Eliquis is affordable. Will send Rx to [facility] per request. No need to apply for pt assistance or change to warfarin.

## 2023-05-07 ENCOUNTER — Other Ambulatory Visit: Payer: Self-pay

## 2023-05-07 ENCOUNTER — Other Ambulatory Visit (HOSPITAL_COMMUNITY): Payer: Self-pay

## 2023-05-07 ENCOUNTER — Encounter (HOSPITAL_COMMUNITY): Payer: Self-pay

## 2023-05-08 ENCOUNTER — Telehealth: Payer: Self-pay | Admitting: Pharmacy Technician

## 2023-05-08 NOTE — Telephone Encounter (Signed)
Additional information has been requested from the patient's insurance in order to proceed with the prior authorization request. Requested information was to back out of a claim- was a test claim. Called insurance to get It reversed

## 2023-05-11 ENCOUNTER — Other Ambulatory Visit (HOSPITAL_COMMUNITY): Payer: Self-pay

## 2023-05-11 ENCOUNTER — Other Ambulatory Visit: Payer: Self-pay

## 2023-05-11 ENCOUNTER — Other Ambulatory Visit (HOSPITAL_COMMUNITY): Payer: Self-pay | Admitting: Cardiology

## 2023-05-11 ENCOUNTER — Ambulatory Visit (HOSPITAL_COMMUNITY)
Admission: RE | Admit: 2023-05-11 | Discharge: 2023-05-11 | Disposition: A | Discharge status: Home / Self Care | Payer: Medicare HMO | Source: Ambulatory Visit | Attending: Cardiology | Admitting: Cardiology

## 2023-05-11 DIAGNOSIS — I5033 Acute on chronic diastolic (congestive) heart failure: Secondary | ICD-10-CM | POA: Diagnosis not present

## 2023-05-11 MED ORDER — GADOBUTROL 1 MMOL/ML IV SOLN
10.0000 mL | Freq: Once | INTRAVENOUS | Status: AC | PRN
  Administered 2023-05-11: 10 mL via INTRAVENOUS

## 2023-05-21 ENCOUNTER — Telehealth: Payer: Self-pay | Admitting: Cardiology

## 2023-05-21 MED ORDER — APIXABAN 5 MG PO TABS: 5.0000 mg | ORAL_TABLET | Freq: Two times a day (BID) | ORAL | 0 refills | Status: DC

## 2023-05-21 NOTE — Telephone Encounter (Signed)
 Prescription refill request for Eliquis  received. Indication: afib  Last office visit: tolia, 04/30/2023 Scr: 0.92, 03/16/2023 Age: 78 yo  Weight: 91.2 kg   Refill sent

## 2023-05-21 NOTE — Telephone Encounter (Signed)
*  STAT* If patient is at the pharmacy, call can be transferred to refill team.   1. Which medications need to be refilled? (please list name of each medication and dose if known) apixaban  (ELIQUIS ) 5 MG TABS tablet     4. Which pharmacy/location (including street and city if local pharmacy) is medication to be sent to?   [facility] [address], [place] - [address] Phone: [phone]  Fax: [phone]       5. Do they need a 30 day or 90 day supply? 3-5 day supply. Pt spouse states [facility] told them they will ship med off today but pt needs some to hold her over until it arrives.

## 2023-05-28 ENCOUNTER — Other Ambulatory Visit: Payer: Self-pay

## 2023-05-28 DIAGNOSIS — I480 Paroxysmal atrial fibrillation: Secondary | ICD-10-CM

## 2023-06-02 ENCOUNTER — Other Ambulatory Visit (HOSPITAL_COMMUNITY): Payer: Self-pay | Admitting: Cardiology

## 2023-06-05 ENCOUNTER — Ambulatory Visit (INDEPENDENT_AMBULATORY_CARE_PROVIDER_SITE_OTHER): Payer: Medicare HMO

## 2023-06-05 DIAGNOSIS — I442 Atrioventricular block, complete: Secondary | ICD-10-CM | POA: Diagnosis not present

## 2023-06-05 LAB — CUP PACEART REMOTE DEVICE CHECK
Battery Remaining Longevity: 119 mo
Battery Remaining Percentage: 95.5 %
Battery Voltage: 3.01 V
Brady Statistic AP VP Percent: 3.2 %
Brady Statistic AP VS Percent: 1 %
Brady Statistic AS VP Percent: 89 %
Brady Statistic AS VS Percent: 6 %
Brady Statistic RA Percent Paced: 3 %
Brady Statistic RV Percent Paced: 92 %
Date Time Interrogation Session: 20250221040014
Implantable Lead Connection Status: 753985
Implantable Lead Connection Status: 753985
Implantable Lead Implant Date: 20240822
Implantable Lead Implant Date: 20240822
Implantable Lead Location: 753859
Implantable Lead Location: 753860
Implantable Pulse Generator Implant Date: 20240822
Lead Channel Impedance Value: 450 Ohm
Lead Channel Impedance Value: 510 Ohm
Lead Channel Pacing Threshold Amplitude: 0.5 V
Lead Channel Pacing Threshold Amplitude: 0.875 V
Lead Channel Pacing Threshold Pulse Width: 0.5 ms
Lead Channel Pacing Threshold Pulse Width: 0.5 ms
Lead Channel Sensing Intrinsic Amplitude: 5 mV
Lead Channel Sensing Intrinsic Amplitude: 7.8 mV
Lead Channel Setting Pacing Amplitude: 1.125
Lead Channel Setting Pacing Amplitude: 1.5 V
Lead Channel Setting Pacing Pulse Width: 0.5 ms
Lead Channel Setting Sensing Sensitivity: 4 mV
Pulse Gen Model: 2272
Pulse Gen Serial Number: 8206065

## 2023-06-09 ENCOUNTER — Encounter: Payer: Self-pay | Admitting: Cardiology

## 2023-06-10 ENCOUNTER — Telehealth: Payer: Self-pay

## 2023-06-10 ENCOUNTER — Ambulatory Visit: Payer: Medicare HMO | Attending: Physician Assistant | Admitting: Physician Assistant

## 2023-06-10 VITALS — BP 132/68 | HR 83 | Ht 64.0 in | Wt 202.0 lb

## 2023-06-10 DIAGNOSIS — I480 Paroxysmal atrial fibrillation: Secondary | ICD-10-CM | POA: Diagnosis not present

## 2023-06-10 DIAGNOSIS — Z01812 Encounter for preprocedural laboratory examination: Secondary | ICD-10-CM | POA: Diagnosis not present

## 2023-06-10 DIAGNOSIS — Z952 Presence of prosthetic heart valve: Secondary | ICD-10-CM

## 2023-06-10 DIAGNOSIS — I5032 Chronic diastolic (congestive) heart failure: Secondary | ICD-10-CM

## 2023-06-10 DIAGNOSIS — Z950 Presence of cardiac pacemaker: Secondary | ICD-10-CM

## 2023-06-10 DIAGNOSIS — I10 Essential (primary) hypertension: Secondary | ICD-10-CM | POA: Diagnosis not present

## 2023-06-10 NOTE — Patient Instructions (Signed)
 Medication Instructions:  The current medical regimen is effective;  continue present plan and medications.  *If you need a refill on your cardiac medications before your next appointment, please call your pharmacy*   Lab Work: Please have blood work today (CBC, BMP)  If you have labs (blood work) drawn today and your tests are completely normal, you will receive your results only by: MyChart Message (if you have MyChart) OR A paper copy in the mail If you have any lab test that is abnormal or we need to change your treatment, we will call you to review the results.   Follow-Up: At [facility], you and your health needs are our priority.  As part of our continuing mission to provide you with exceptional heart care, we have created designated Provider Care Teams.  These Care Teams include your primary Cardiologist (physician) and Advanced Practice Providers (APPs -  Physician Assistants and Nurse Practitioners) who all work together to provide you with the care you need, when you need it.  We recommend signing up for the patient portal called "MyChart".  Sign up information is provided on this After Visit Summary.  MyChart is used to connect with patients for Virtual Visits (Telemedicine).  Patients are able to view lab/test results, encounter notes, upcoming appointments, etc.  Non-urgent messages can be sent to your provider as well.   To learn more about what you can do with MyChart, go to [email or web address].    Your next appointment:   As scheduled

## 2023-06-10 NOTE — Telephone Encounter (Signed)
 Late documentation.  Previously spoke with Dr. Lalla Brothers, who confirmed repeat CT was NOT needed prior to LAAO. The patient is scheduled for LAAO on 06/18/2023.

## 2023-06-10 NOTE — Progress Notes (Signed)
 [facility]   MULTIDISCIPLINARY HEART VALVE CLINIC                                     Cardiology Office Note:    Date:  06/10/2023   ID:  BROCK MOKRY, DOB 1945/11/19, MRN [id number]  PCP:  Joycelyn Rua, MD  [facility] Cardiologist:  Tessa Lerner, DO  [facility] Electrophysiologist:  Lanier Prude, MD   Referring MD: Joycelyn Rua, MD   Pre Watchman visit - set up for 06/18/23  History of Present Illness:    [name] is a 78 y.o. female with a hx of breast cancer s/p bilateral mastectomies (1998 and then 2007 with cancer recurrence), morbid obesity, former tobacco abuse, ventricular tachycardia, DMT2, HTN, severe LFLG aortic stenosis s/p TAVR (12/02/22), CHB s/p PPM (12/04/22)  and device detected PAF on Eliquis who presents to clinic to discuss upcoming Watchman.   She reported progressive exertional shortness of breath, fatigue and generalized weakness in 06/2022. Echo in 08/2022 showed aortic stenosis, but it was not possible to determine the severity due to poor windows related to her mastectomies and reconstructions. TEE on 10/14/2022 showed EF 55% and severe LFLG AS with mean grad 31 mmHg, AVA 0.81 cm2, SVI 32 and mild-mod MR/TR. [facility] 10/14/22 showed no significant coronary artery disease. S/p TAVR with a 26 mm Edwards Sapien 3 Ultra Resilia THV via the TF approach on 12/02/22. Post operative echo showed stable valve function and gradients with a 26mm S3UR with an AVA at 3.17cm2 and mean gradient at . She was discharged with ZIO monitor due to underlying RBBB/LAFB, which showed CHB requiring PPM placement with St Jude dual chamber with Dr. Lalla Brothers 12/04/22. Her device showed afib and ASA was transitioned to Eliquis. Follow up PYP scan was equivocal for cardiac amyloid and seen by Dr. Shirlee Latch. cMRI was not highly suggestive of cardiac amyloidosis.   She has been non complaint with Eliquis due to excessive costs and was set up for Watchman. Cardiac CT from TAVR  was reviewed and she was felt to suitable anatomy.   Today the patient presents to clinic for follow up. Here with husband. She does virtual cardiac rehab through [facility]. No CP or SOB. No LE edema, orthopnea or PND. No dizziness or syncope. No blood in stool or urine. No palpitations. She has lost 30 lbs with diet and exercise. Has full dentures that will have to be refit 2/2 weight loss.  Past Medical History:  Diagnosis Date   Allergic rhinitis    Asthma    Childhood per patient   Breast cancer, left (HCC) 2007   ductal carcinoma in situ, underwent left mastectomy 07/2005; reconstructive surgery with expander, permanent implant placed 03/2006, hospitalized January 2008 with breast cellulitis.   Breast cancer, right (HCC) 1998   1998; modified radical right mastectomy 1998 for infiltrating ductal adenocarcinoma with 7 of 12 axillary nodes positive. Underwent adjuvant chemotherapy and bone marrow transplant by Dr Greggory Stallion as well as radiation therapy. Also had Tram flap reconstruction.   Diabetes mellitus without complication (HCC)    GERD (gastroesophageal reflux disease)    Hiatal hernia    History of ventricular tachycardia    Hypertension    Vitamin D deficiency      Current Medications: Current Meds  Medication Sig   albuterol (VENTOLIN HFA) 108 (90 Base) MCG/ACT inhaler Inhale 2 puffs into the  lungs every 4 (four) hours as needed for wheezing or shortness of breath.   apixaban (ELIQUIS) 5 MG TABS tablet Take 1 tablet (5 mg total) by mouth 2 (two) times daily.   B Complex Vitamins (B COMPLEX PO) Take 1 capsule by mouth daily.   Cholecalciferol (VITAMIN D3) 125 MCG (5000 UT) TABS Take 5,000 Units by mouth daily.   FARXIGA 10 MG TABS tablet Take 1 tablet (10 mg total) by mouth daily before breakfast.   lisinopril-hydrochlorothiazide (ZESTORETIC) 20-12.5 MG tablet Take 1 tablet by mouth daily.   Multiple Vitamin (MULTIVITAMINS PO) Take 1 tablet by mouth daily.   naproxen sodium (ALEVE)  220 MG tablet Take 220 mg by mouth daily.   OZEMPIC, 0.25 OR 0.5 MG/DOSE, 2 MG/3ML SOPN Inject 0.5 mg into the skin every Saturday.   pantoprazole (PROTONIX) 40 MG tablet Take 40 mg by mouth daily.   polyvinyl alcohol (LIQUIFILM TEARS) 1.4 % ophthalmic solution Place 1 drop into both eyes as needed for dry eyes.   potassium chloride SA (KLOR-CON M) 10 MEQ tablet Take 2 tablets (20 mEq total) by mouth 2 (two) times daily.   Rosuvastatin Calcium (CRESTOR PO) Take 1 tablet by mouth daily.   solifenacin (VESICARE) 5 MG tablet Take 5 mg by mouth daily.   torsemide (DEMADEX) 10 MG tablet Take 1 tablet (10 mg total) by mouth every other day.   [DISCONTINUED] potassium chloride (KLOR-CON) 10 MEQ tablet Take 20 mEq by mouth 2 (two) times daily.      ROS:   Please see the history of present illness.    All other systems reviewed and are negative.  EKGs   EKG Interpretation Date/Time:  Wednesday June 10 2023 14:59:04 EST Ventricular Rate:  87 PR Interval:  182 QRS Duration:  138 QT Interval:  436 QTC Calculation: 524 R Axis:   -48  Text Interpretation: Atrial-sensed ventricular-paced rhythm Confirmed by Cline Crock [phone]) on 06/10/2023 3:17:27 PM   Risk Assessment/Calculations:    HAS-BLED score 2 Hypertension Yes  Abnormal renal and liver function (Dialysis, transplant, Cr >2.26 mg/dL /Cirrhosis or Bilirubin >2x Normal or AST/ALT/AP >3x Normal) No  Stroke No  Bleeding No  Labile INR (Unstable/high INR) No  Elderly (>65) Yes  Drugs or alcohol (>= 8 drinks/week, anti-plt or NSAID) No    CHA2DS2-VASc Score = 5  The patient's score is based upon: CHF History: 0 HTN History: 1 Diabetes History: 1 Stroke History: 0 Vascular Disease History: 0 Age Score: 2 Gender Score: 1   Physical Exam:    VS:  BP 132/68   Pulse 83   Ht 5\' 4"  (1.626 m)   Wt 202 lb (91.6 kg)   SpO2 93%   BMI 34.67 kg/m     Wt Readings from Last 3 Encounters:  06/10/23 202 lb (91.6 kg)   04/30/23 201 lb (91.2 kg)  04/14/23 198 lb 6.4 oz (90 kg)     GEN: Well nourished, well developed in no acute distress NECK: No JVD CARDIAC: RRR, no murmurs, rubs, gallops RESPIRATORY:  Clear to auscultation without rales, wheezing or rhonchi  ABDOMEN: Soft, non-tender, non-distended EXTREMITIES:  No edema; No deformity.    ASSESSMENT:    1. Paroxysmal A-fib (HCC)   2. S/P TAVR (transcatheter aortic valve replacement)   3. Pacemaker   4. Chronic diastolic heart failure (HCC)   5. Essential hypertension     PLAN:    In order of problems listed above:  PAF:  -- She is currently  in sinus by ECG today. -- On Eliquis 5mg  BID for a CHADSVASC of 5, but not felt to be a good long term candidate for [facility] given non compliance related to prohibitive cost.  -- Cardiac CT for previous TAVR uploaded to industry and felt to have suitable anatomy for LAAO. -- Set up for LAAO with Watchman on 06/18/23 with Dr. Lalla Brothers. -- ECG/BMET/CBC ordered today. Soap and surgery instruction letter provided to patient today.   S/p PPM: -- Followed by Dr. Lalla Brothers  S/p TAVR: -- Normal function of valve.  -- Will see back in valve clinic in 11/2023.  Chronic diastolic CHF:  -- Appears euvolemic. -- NYHA class I symptoms. -- Continue Zestoric 20-12.5mg  daily, torsemide 10mg  every other day and Farixga 10mg  daily    HTN: -- BP well controlled on Zestoric 20-12.5mg  daily  Medication Adjustments/Labs and Tests Ordered: Current medicines are reviewed at length with the patient today.  Concerns regarding medicines are outlined above.  Orders Placed This Encounter  Procedures   CBC   Basic metabolic panel   EKG 12-Lead   No orders of the defined types were placed in this encounter.   Patient Instructions  Medication Instructions:  The current medical regimen is effective;  continue present plan and medications.  *If you need a refill on your cardiac medications before your next appointment, please  call your pharmacy*   Lab Work: Please have blood work today (CBC, BMP)  If you have labs (blood work) drawn today and your tests are completely normal, you will receive your results only by: MyChart Message (if you have MyChart) OR A paper copy in the mail If you have any lab test that is abnormal or we need to change your treatment, we will call you to review the results.   Follow-Up: At [facility], you and your health needs are our priority.  As part of our continuing mission to provide you with exceptional heart care, we have created designated Provider Care Teams.  These Care Teams include your primary Cardiologist (physician) and Advanced Practice Providers (APPs -  Physician Assistants and Nurse Practitioners) who all work together to provide you with the care you need, when you need it.  We recommend signing up for the patient portal called "MyChart".  Sign up information is provided on this After Visit Summary.  MyChart is used to connect with patients for Virtual Visits (Telemedicine).  Patients are able to view lab/test results, encounter notes, upcoming appointments, etc.  Non-urgent messages can be sent to your provider as well.   To learn more about what you can do with MyChart, go to [email or web address].    Your next appointment:   As scheduled    Signed, Cline Crock, PA-C  06/10/2023 5:46 PM    [facility]

## 2023-06-11 ENCOUNTER — Telehealth: Payer: Self-pay

## 2023-06-11 LAB — BASIC METABOLIC PANEL
BUN/Creatinine Ratio: 23 (ref 12–28)
BUN: 19 mg/dL (ref 8–27)
CO2: 25 mmol/L (ref 20–29)
Calcium: 9.7 mg/dL (ref 8.7–10.3)
Chloride: 99 mmol/L (ref 96–106)
Creatinine, Ser: 0.82 mg/dL (ref 0.57–1.00)
Glucose: 88 mg/dL (ref 70–99)
Potassium: 3.8 mmol/L (ref 3.5–5.2)
Sodium: 140 mmol/L (ref 134–144)
eGFR: 74 mL/min/{1.73_m2} (ref >59)

## 2023-06-11 LAB — CBC
Hematocrit: 44 % (ref 34.0–46.6)
Hemoglobin: 14.5 g/dL (ref 11.1–15.9)
MCH: 28.2 pg (ref 26.6–33.0)
MCHC: 33 g/dL (ref 31.5–35.7)
MCV: 86 fL (ref 79–97)
Platelets: 170 10*3/uL (ref 150–450)
RBC: 5.14 x10E6/uL (ref 3.77–5.28)
RDW: 14.5 % (ref 11.7–15.4)
WBC: 6.7 10*3/uL (ref 3.4–10.8)

## 2023-06-11 NOTE — Telephone Encounter (Signed)
 Christell Constant, MD  Henrietta Dine, RN There is a cauliflower appendage with multiple distal lobes. Ostium is 20.6 cm diameter- consider 24 mm Watchman FLX pro. A mid-mid-posterior stick is recommended. Consider True-Steer. Access can be gained with a double curve but to gain access to the distal lobes (average wall distance ~ 15 mm suitable for a 24 mm device) True Alvy Beal may be helpful.  Thanks, MAC

## 2023-06-15 ENCOUNTER — Telehealth: Payer: Self-pay

## 2023-06-15 ENCOUNTER — Encounter: Payer: Self-pay | Admitting: Cardiology

## 2023-06-15 NOTE — Telephone Encounter (Signed)
 Marland Kitchen

## 2023-06-15 NOTE — Progress Notes (Signed)
 PERIOPERATIVE PRESCRIPTION FOR IMPLANTED CARDIAC DEVICE PROGRAMMING  Patient Information: Name:  [name]  DOB:  1945-08-09  MRN:  [id number]    Planned Procedure:  Left atrial appendage occlusion Surgeon:  Dr. Steffanie Dunn Date of Procedure: 06/18/2023 Position during surgery:  supine  Device Information:  Clinic EP Physician:  Dr. Steffanie Dunn  Device Type:  Pacemaker Manufacturer and Phone #:  St. Jude/Abbott: [phone] Pacemaker Dependent?:  Not dependent last check.   Date of Last Device Check:  06/05/2023 Normal Device Function?:  Yes.    Electrophysiologist's Recommendations:  Have magnet available. Provide continuous ECG monitoring when magnet is used or reprogramming is to be performed.  Procedure will likely interfere with device function.  Device should be programmed:  Asynchronous pacing during procedure and returned to normal programming after procedure  Per Device Clinic Standing Orders, Wiliam Ke, RN  4:23 PM 06/15/2023

## 2023-06-16 ENCOUNTER — Telehealth: Payer: Self-pay

## 2023-06-16 NOTE — Telephone Encounter (Signed)
 Confirmed procedure date of 06/18/2023. Confirmed arrival time of 1100 for procedure time at 1330. Reviewed pre-procedure instructions with patient. She skipped Ozempic and has stopped [place].  Confirmed she does not have a contrast allergy. She does have a PPM - Device Clinic and rep are aware. The patient understands to call if questions/concerns arise prior to procedure.  She was grateful for call and agreed with plan.

## 2023-06-18 ENCOUNTER — Inpatient Hospital Stay (HOSPITAL_COMMUNITY): Admitting: Anesthesiology

## 2023-06-18 ENCOUNTER — Inpatient Hospital Stay (HOSPITAL_COMMUNITY)
Admission: RE | Admit: 2023-06-18 | Discharge: 2023-06-19 | DRG: 274 | Disposition: A | Discharge status: Home / Self Care | Payer: Medicare HMO | Attending: Cardiology | Admitting: Cardiology

## 2023-06-18 ENCOUNTER — Other Ambulatory Visit: Payer: Self-pay

## 2023-06-18 ENCOUNTER — Inpatient Hospital Stay (HOSPITAL_COMMUNITY)
Admission: RE | Disposition: A | Discharge status: Home / Self Care | Payer: Medicare HMO | Source: Home / Self Care | Attending: Cardiology

## 2023-06-18 ENCOUNTER — Encounter (HOSPITAL_COMMUNITY): Payer: Self-pay | Admitting: Cardiology

## 2023-06-18 ENCOUNTER — Inpatient Hospital Stay (HOSPITAL_COMMUNITY)

## 2023-06-18 DIAGNOSIS — J45909 Unspecified asthma, uncomplicated: Secondary | ICD-10-CM | POA: Diagnosis not present

## 2023-06-18 DIAGNOSIS — I442 Atrioventricular block, complete: Secondary | ICD-10-CM | POA: Diagnosis present

## 2023-06-18 DIAGNOSIS — Z006 Encounter for examination for normal comparison and control in clinical research program: Secondary | ICD-10-CM

## 2023-06-18 DIAGNOSIS — E669 Obesity, unspecified: Secondary | ICD-10-CM | POA: Diagnosis present

## 2023-06-18 DIAGNOSIS — I110 Hypertensive heart disease with heart failure: Secondary | ICD-10-CM

## 2023-06-18 DIAGNOSIS — E8582 Wild-type transthyretin-related (ATTR) amyloidosis: Secondary | ICD-10-CM | POA: Diagnosis not present

## 2023-06-18 DIAGNOSIS — Z87891 Personal history of nicotine dependence: Secondary | ICD-10-CM

## 2023-06-18 DIAGNOSIS — J929 Pleural plaque without asbestos: Secondary | ICD-10-CM | POA: Diagnosis not present

## 2023-06-18 DIAGNOSIS — I4891 Unspecified atrial fibrillation: Principal | ICD-10-CM | POA: Diagnosis present

## 2023-06-18 DIAGNOSIS — Z6834 Body mass index (BMI) 34.0-34.9, adult: Secondary | ICD-10-CM | POA: Diagnosis not present

## 2023-06-18 DIAGNOSIS — Z853 Personal history of malignant neoplasm of breast: Secondary | ICD-10-CM | POA: Diagnosis not present

## 2023-06-18 DIAGNOSIS — Z952 Presence of prosthetic heart valve: Secondary | ICD-10-CM | POA: Diagnosis not present

## 2023-06-18 DIAGNOSIS — I5033 Acute on chronic diastolic (congestive) heart failure: Secondary | ICD-10-CM

## 2023-06-18 DIAGNOSIS — I480 Paroxysmal atrial fibrillation: Principal | ICD-10-CM | POA: Diagnosis present

## 2023-06-18 DIAGNOSIS — Z01818 Encounter for other preprocedural examination: Secondary | ICD-10-CM | POA: Diagnosis not present

## 2023-06-18 DIAGNOSIS — Z9013 Acquired absence of bilateral breasts and nipples: Secondary | ICD-10-CM | POA: Diagnosis not present

## 2023-06-18 DIAGNOSIS — Z950 Presence of cardiac pacemaker: Secondary | ICD-10-CM | POA: Diagnosis not present

## 2023-06-18 DIAGNOSIS — I5032 Chronic diastolic (congestive) heart failure: Secondary | ICD-10-CM | POA: Diagnosis present

## 2023-06-18 DIAGNOSIS — E119 Type 2 diabetes mellitus without complications: Secondary | ICD-10-CM | POA: Diagnosis not present

## 2023-06-18 DIAGNOSIS — Z7901 Long term (current) use of anticoagulants: Secondary | ICD-10-CM

## 2023-06-18 HISTORY — PX: LEFT ATRIAL APPENDAGE OCCLUSION: EP1229

## 2023-06-18 HISTORY — PX: TRANSESOPHAGEAL ECHOCARDIOGRAM (CATH LAB): EP1270

## 2023-06-18 LAB — ECHO TEE
AR max vel: 2.01 cm2
AV Area VTI: 2.24 cm2
AV Area mean vel: 2.27 cm2
AV Mean grad: 7 mmHg
AV Peak grad: 14.1 mmHg
Ao pk vel: 1.88 m/s

## 2023-06-18 LAB — TYPE AND SCREEN
ABO/RH(D): A NEG
Antibody Screen: NEGATIVE

## 2023-06-18 LAB — POCT ACTIVATED CLOTTING TIME: Activated Clotting Time: 428 s

## 2023-06-18 LAB — GLUCOSE, CAPILLARY
Glucose-Capillary: 142 mg/dL — ABNORMAL HIGH (ref 70–99)
Glucose-Capillary: 96 mg/dL (ref 70–99)

## 2023-06-18 LAB — SURGICAL PCR SCREEN
MRSA, PCR: NEGATIVE
Staphylococcus aureus: POSITIVE — AB

## 2023-06-18 SURGERY — LEFT ATRIAL APPENDAGE OCCLUSION
Anesthesia: General

## 2023-06-18 MED ORDER — MUPIROCIN 2 % EX OINT
TOPICAL_OINTMENT | CUTANEOUS | Status: AC
  Administered 2023-06-18: 1 via TOPICAL
  Filled 2023-06-18: qty 22

## 2023-06-18 MED ORDER — PANTOPRAZOLE SODIUM 40 MG PO TBEC
40.0000 mg | DELAYED_RELEASE_TABLET | Freq: Every day | ORAL | Status: DC
  Administered 2023-06-19: 40 mg via ORAL
  Filled 2023-06-18: qty 1

## 2023-06-18 MED ORDER — HYDROCHLOROTHIAZIDE 12.5 MG PO TABS
12.5000 mg | ORAL_TABLET | Freq: Every day | ORAL | Status: DC
  Administered 2023-06-19: 12.5 mg via ORAL
  Filled 2023-06-18: qty 1

## 2023-06-18 MED ORDER — APIXABAN 5 MG PO TABS
5.0000 mg | ORAL_TABLET | Freq: Two times a day (BID) | ORAL | Status: DC
  Administered 2023-06-18 – 2023-06-19 (×2): 5 mg via ORAL
  Filled 2023-06-18 (×2): qty 1

## 2023-06-18 MED ORDER — HEPARIN SODIUM (PORCINE) 1000 UNIT/ML IJ SOLN
INTRAMUSCULAR | Status: AC
  Filled 2023-06-18: qty 20

## 2023-06-18 MED ORDER — LIDOCAINE 2% (20 MG/ML) 5 ML SYRINGE
INTRAMUSCULAR | Status: DC | PRN
  Administered 2023-06-18: 100 mg via INTRAVENOUS

## 2023-06-18 MED ORDER — IOHEXOL 350 MG/ML SOLN
INTRAVENOUS | Status: DC | PRN
  Administered 2023-06-18: 30 mL

## 2023-06-18 MED ORDER — FENTANYL CITRATE (PF) 100 MCG/2ML IJ SOLN
INTRAMUSCULAR | Status: AC
  Filled 2023-06-18: qty 2

## 2023-06-18 MED ORDER — HEPARIN (PORCINE) IN NACL 1000-0.9 UT/500ML-% IV SOLN
INTRAVENOUS | Status: DC | PRN
  Administered 2023-06-18 (×3): 500 mL

## 2023-06-18 MED ORDER — ONDANSETRON HCL 4 MG/2ML IJ SOLN
INTRAMUSCULAR | Status: DC | PRN
  Administered 2023-06-18: 4 mg via INTRAVENOUS

## 2023-06-18 MED ORDER — PROPOFOL 10 MG/ML IV BOLUS
INTRAVENOUS | Status: DC | PRN
  Administered 2023-06-18: 100 mg via INTRAVENOUS

## 2023-06-18 MED ORDER — SODIUM CHLORIDE 0.9 % IV SOLN
INTRAVENOUS | Status: DC
Start: 2023-06-18 — End: 2023-06-18

## 2023-06-18 MED ORDER — LISINOPRIL-HYDROCHLOROTHIAZIDE 20-12.5 MG PO TABS: 1.0000 | ORAL_TABLET | Freq: Every day | ORAL | Status: DC

## 2023-06-18 MED ORDER — INSULIN ASPART 100 UNIT/ML IJ SOLN: 0.0000 [IU] | INTRAMUSCULAR | Status: DC | PRN

## 2023-06-18 MED ORDER — MAGNESIUM 400 MG PO CAPS: 400.0000 mg | ORAL_CAPSULE | Freq: Every day | ORAL | Status: DC

## 2023-06-18 MED ORDER — CHLORHEXIDINE GLUCONATE 0.12 % MT SOLN
OROMUCOSAL | Status: AC
  Administered 2023-06-18: 15 mL
  Filled 2023-06-18: qty 15

## 2023-06-18 MED ORDER — LISINOPRIL 20 MG PO TABS
20.0000 mg | ORAL_TABLET | Freq: Every day | ORAL | Status: DC
  Administered 2023-06-19: 20 mg via ORAL
  Filled 2023-06-18: qty 1

## 2023-06-18 MED ORDER — DAPAGLIFLOZIN PROPANEDIOL 10 MG PO TABS
10.0000 mg | ORAL_TABLET | Freq: Every day | ORAL | Status: DC
  Administered 2023-06-19: 10 mg via ORAL
  Filled 2023-06-18: qty 1

## 2023-06-18 MED ORDER — ROCURONIUM BROMIDE 10 MG/ML (PF) SYRINGE
PREFILLED_SYRINGE | INTRAVENOUS | Status: DC | PRN
Start: 2023-06-18 — End: 2023-06-18
  Administered 2023-06-18: 20 mg via INTRAVENOUS
  Administered 2023-06-18: 60 mg via INTRAVENOUS

## 2023-06-18 MED ORDER — SODIUM CHLORIDE 0.9% FLUSH
3.0000 mL | Freq: Two times a day (BID) | INTRAVENOUS | Status: DC
  Administered 2023-06-18 – 2023-06-19 (×2): 3 mL via INTRAVENOUS

## 2023-06-18 MED ORDER — SUGAMMADEX SODIUM 200 MG/2ML IV SOLN
INTRAVENOUS | Status: DC | PRN
  Administered 2023-06-18: 200 mg via INTRAVENOUS

## 2023-06-18 MED ORDER — MUPIROCIN 2 % EX OINT: 1.0000 | TOPICAL_OINTMENT | Freq: Once | CUTANEOUS | Status: AC

## 2023-06-18 MED ORDER — SODIUM CHLORIDE 0.9% FLUSH: 3.0000 mL | INTRAVENOUS | Status: DC | PRN

## 2023-06-18 MED ORDER — VANCOMYCIN HCL IN DEXTROSE 1-5 GM/200ML-% IV SOLN
1000.0000 mg | INTRAVENOUS | Status: AC
  Administered 2023-06-18: 1000 mg via INTRAVENOUS
  Filled 2023-06-18: qty 200

## 2023-06-18 MED ORDER — HEPARIN SODIUM (PORCINE) 1000 UNIT/ML IJ SOLN
INTRAMUSCULAR | Status: DC | PRN
  Administered 2023-06-18: 14000 [IU] via INTRAVENOUS

## 2023-06-18 MED ORDER — MAGNESIUM OXIDE -MG SUPPLEMENT 400 (240 MG) MG PO TABS
400.0000 mg | ORAL_TABLET | Freq: Every day | ORAL | Status: DC
  Administered 2023-06-19: 400 mg via ORAL
  Filled 2023-06-18: qty 1

## 2023-06-18 MED ORDER — ONDANSETRON HCL 4 MG/2ML IJ SOLN: 4.0000 mg | Freq: Four times a day (QID) | INTRAMUSCULAR | Status: DC | PRN

## 2023-06-18 MED ORDER — PROTAMINE SULFATE 10 MG/ML IV SOLN
INTRAVENOUS | Status: DC | PRN
  Administered 2023-06-18: 35 mg via INTRAVENOUS

## 2023-06-18 MED ORDER — LACTATED RINGERS IV SOLN: INTRAVENOUS | Status: DC | PRN

## 2023-06-18 MED ORDER — ACETAMINOPHEN 325 MG PO TABS: 650.0000 mg | ORAL_TABLET | ORAL | Status: DC | PRN

## 2023-06-18 MED ORDER — CHLORHEXIDINE GLUCONATE 4 % EX SOLN: Freq: Once | CUTANEOUS | Status: DC

## 2023-06-18 MED ORDER — FENTANYL CITRATE (PF) 250 MCG/5ML IJ SOLN
INTRAMUSCULAR | Status: DC | PRN
  Administered 2023-06-18: 100 ug via INTRAVENOUS

## 2023-06-18 MED ORDER — SODIUM CHLORIDE 0.9 % IV SOLN: 250.0000 mL | INTRAVENOUS | Status: DC | PRN

## 2023-06-18 MED ORDER — ROSUVASTATIN CALCIUM 5 MG PO TABS
5.0000 mg | ORAL_TABLET | Freq: Every day | ORAL | Status: DC
  Administered 2023-06-19: 5 mg via ORAL
  Filled 2023-06-18: qty 1

## 2023-06-18 SURGICAL SUPPLY — 3 items
INQWIRE 1.5J .035X260CM (WIRE) ×1 IMPLANT
WATCHMAN FLX PRO PROCEDURE (KITS) ×1 IMPLANT
WATCHMAN TRUSTEER PROCEDURE (KITS) ×1 IMPLANT

## 2023-06-18 NOTE — H&P (Signed)
 Electrophysiology Office Follow up Visit Note:     Date:  06/18/2023    ID:  [name], DOB 1946/03/13, MRN [id number]   PCP:  Joycelyn Rua, MD   [facility] Cardiologist:  Tessa Lerner, DO  [facility] Electrophysiologist:  Lanier Prude, MD      Interval History:       [name] is a 78 y.o. female who presents for a follow up visit.    She had a permanent pacemaker implanted December 04, 2022 for complete heart block following TAVR. Since implant remote interrogations have shown stable device function. She did have a PYP amyloid scan in October that was consistent with cardiac ATTR amyloidosis.   She saw Dr. Lucien Mons in the heart failure clinic March 03, 2023.  Dr. Shirlee Latch felt that the scan was equivocal and recommended a cardiac MRI to further investigate.   She is well today.  She is with her husband today in clinic.  She has been off her Eliquis for several weeks because of excessive costs.  She is interested in stroke risk mitigation strategies that avoid long-term exposure anticoagulation  Presents for LAAO today. Procedure reviewed.   Objective Past medical, surgical, social and family history were reviewed.   ROS:   Please see the history of present illness.    All other systems reviewed and are negative.   EKGs/Labs/Other Studies Reviewed:     The following studies were reviewed today:   April 14, 2023 in clinic device interrogation personally reviewed Battery and lead parameter stable.   I reviewed her July CT scan and her left atrial appendage appears suitable for closure         Physical Exam:     VS:  BP 117/80 (BP Location: Left Arm, Patient Position: Sitting)   Pulse 89   Ht 5\' 4"  (1.626 m)   Wt 198 lb 6.4 oz (90 kg)   SpO2 96%   BMI 34.06 kg/m         Wt Readings from Last 3 Encounters:  04/14/23 198 lb 6.4 oz (90 kg)  03/03/23 204 lb 12.8 oz (92.9 kg)  02/05/23 206 lb (93.4 kg)      GEN: no distress CARD: RRR, No  MRG.  Pacemaker pocket well-healed RESP: No IWOB. CTAB.     Assessment ASSESSMENT:     1. Complete heart block (HCC)   2. Pacemaker   3. Paroxysmal A-fib (HCC)   4. Chronic diastolic heart failure (HCC)     PLAN:     In order of problems listed above:   #Complete heart block #Permanent pacemaker in situ Doing well after pacemaker implant.  Continue remote monitoring.   #Atrial fibrillation Continue Eliquis for stroke prophylaxis.  She has missed several weeks of the medication due to excessive cost.  I discussed left atrial appendage occlusion during today's visit and she wishes to proceed with scheduling.  She will not need a CT scan prior to the procedure.   -------   I have seen [name] in the office today who is being considered for a Watchman left atrial appendage closure device. I believe they will benefit from this procedure given their history of atrial fibrillation, CHA2DS2-VASc score of 5 and unadjusted ischemic stroke rate of 7.2% per year. Unfortunately, the patient is not felt to be a long term anticoagulation candidate secondary to excessive costs leading to missed doses. The patient's chart has been reviewed and I feel that they would be  a candidate for short term oral anticoagulation after Watchman implant.    It is my belief that after undergoing a LAA closure procedure, [name] will not need long term anticoagulation which eliminates anticoagulation side effects and major bleeding risk.    Procedural risks for the Watchman implant have been reviewed with the patient including a 0.5% risk of stroke, <1% risk of perforation and <1% risk of device embolization. Other risks include bleeding, vascular damage, tamponade, worsening renal function, and death. The patient understands these risk and wishes to proceed.       The published clinical data on the safety and effectiveness of WATCHMAN include but are not limited to the following: - Holmes DR, Everlene Farrier,  Sick P et al. for the PROTECT AF Investigators. Percutaneous closure of the left atrial appendage versus warfarin therapy for prevention of stroke in patients with atrial fibrillation: a randomised non-inferiority trial. Lancet 2009; 374: 534-42. Everlene Farrier, Doshi SK, Isa Rankin D et al. on behalf of the PROTECT AF Investigators. Percutaneous Left Atrial Appendage Closure for Stroke Prophylaxis in Patients With Atrial Fibrillation 2.3-Year Follow-up of the PROTECT AF (Watchman Left Atrial Appendage System for Embolic Protection in Patients With Atrial Fibrillation) Trial. Circulation 2013; 127:720-729. - Alli O, Doshi S,  Kar S, Reddy VY, Sievert H et al. Quality of Life Assessment in the Randomized PROTECT AF (Percutaneous Closure of the Left Atrial Appendage Versus Warfarin Therapy for Prevention of Stroke in Patients With Atrial Fibrillation) Trial of Patients at Risk for Stroke With Nonvalvular Atrial Fibrillation. J Am Coll Cardiol 2013; 61:1790-8. Aline August DR, [place]ia [place], Price M, Whisenant B, Sievert H, Doshi S, Huber K, Reddy V. Prospective randomized evaluation of the Watchman left atrial appendage Device in patients with atrial fibrillation versus long-term warfarin therapy; the PREVAIL trial. Journal of the [employer] of Cardiology, Vol. 4, No. 1, 2014, 1-11. - Kar S, Doshi SK, Sadhu A, Horton R, Osorio J et al. Primary outcome evaluation of a next-generation left atrial appendage closure device: results from the PINNACLE FLX trial. Circulation 2021;143(18)1754-1762.      After today's visit with the patient which was dedicated solely for shared decision making visit regarding LAA closure device, the patient decided to proceed with the LAA appendage closure procedure scheduled to be done in the near future at [facility]. Prior to the procedure, I would like to obtain a gated CT scan of the chest with contrast timed for PV/LA visualization.      HAS-BLED score 2 Hypertension  Yes  Abnormal renal and liver function (Dialysis, transplant, Cr >2.26 mg/dL /Cirrhosis or Bilirubin >2x Normal or AST/ALT/AP >3x Normal) No  Stroke No  Bleeding No  Labile INR (Unstable/high INR) No  Elderly (>65) Yes  Drugs or alcohol (>= 8 drinks/week, anti-plt or NSAID) No    CHA2DS2-VASc Score = 5  The patient's score is based upon: CHF History: 0 HTN History: 1 Diabetes History: 1 Stroke History: 0 Vascular Disease History: 0 Age Score: 2 Gender Score: 1       #Chronic diastolic heart failure Follows with Dr. Shirlee Latch in the heart failure clinic.  Possible diagnosis of amyloid.    Presents for LAAO today. Procedure reviewed.     Signed, Steffanie Dunn, MD, [facility], [facility] 06/18/2023 Electrophysiology [facility]

## 2023-06-18 NOTE — Anesthesia Procedure Notes (Signed)
 Procedure Name: Intubation Date/Time: 06/18/2023 1:07 PM  Performed by: Gus Puma, CRNAPre-anesthesia Checklist: Patient identified, Emergency Drugs available, Suction available and Patient being monitored Patient Re-evaluated:Patient Re-evaluated prior to induction Oxygen Delivery Method: Circle System Utilized Preoxygenation: Pre-oxygenation with 100% oxygen Induction Type: IV induction Ventilation: Mask ventilation without difficulty Laryngoscope Size: Mac and 3 Grade View: Grade I Tube type: Oral Tube size: 7.5 mm Number of attempts: 1 Airway Equipment and Method: Stylet Placement Confirmation: ETT inserted through vocal cords under direct vision, positive ETCO2 and breath sounds checked- equal and bilateral Secured at: 21 cm Tube secured with: Tape Dental Injury: Teeth and Oropharynx as per pre-operative assessment

## 2023-06-18 NOTE — Discharge Instructions (Signed)
 [name] Procedure, Care After  Procedure MD: Dr. Isidoro Donning Clinical Coordinator: Karsten Fells, RN  This sheet gives you information about how to care for yourself after your procedure. Your health care provider may also give you more specific instructions. If you have problems or questions, contact your health care provider.  What can I expect after the procedure? After the procedure, it is common to have: Bruising around your puncture site. Tenderness around your puncture site. Tiredness (fatigue).  Medication instructions It is very important to continue to take your blood thinner as directed by your doctor after the Watchman procedure. Call your procedure doctor's office with question or concerns. If you are on Coumadin (warfarin), you will have your INR checked the week after your procedure, with a goal INR of 2.0 - 3.0. Please follow your medication instructions on your discharge summary. Only take the medications listed on your discharge paperwork.  Follow up You will be seen in 6 weeks after your procedure You will have a repeat CT scan or Echocardiogram approximately 8 weeks after your procedure mark to check your device You will follow up the MD/APP who performed your procedure 6 months after your procedure The Watchman Clinical Coordinator will check in with you from time to time, including 1 and 2 years after your procedure.  NO DENTAL CLEANINGS FOR 30 days. After that, you will require antibiotics for dental procedures the first 6 months.   Follow these instructions at home: Puncture site care  Follow instructions from your health care provider about how to take care of your puncture site. Make sure you: If present, leave stitches (sutures), skin glue, or adhesive strips in place.  If a large square bandage is present, this may be removed 24 hours after surgery.  Check your puncture site every day for signs of infection. Check for: Redness, swelling, or pain. Fluid  or blood. If your puncture site starts to bleed, lie down on your back, apply firm pressure to the area, and contact your health care provider. Warmth. Pus or a bad smell. Driving Do not drive yourself home if you received sedation Do not drive for at least 4 days after your procedure or however long your health care provider recommends. (Do not resume driving if you have previously been instructed not to drive for other health reasons.) Do not spend greater than 1 hour at a time in a car for the first 3 days. Stop and take a break with a 5 minute walk at least every hour.  Do not drive or use heavy machinery while taking prescription pain medicine.  Activity Avoid activities that take a lot of effort, including exercise, for at least 7 days after your procedure. For the first 3 days, avoid sitting for longer than one hour at a time.  Avoid alcoholic beverages, signing paperwork, or participating in legal proceedings for 24 hours after receiving sedation Do not lift anything that is heavier than 10 lb (4.5 kg) for one week.  No sexual activity for 1 week.  Return to your normal activities as told by your health care provider. Ask your health care provider what activities are safe for you. General instructions Take over-the-counter and prescription medicines only as told by your health care provider. Do not use any products that contain nicotine or tobacco, such as cigarettes and e-cigarettes. If you need help quitting, ask your health care provider. You may shower after 24 hours, but Do not take baths, swim, or use a hot tub for  1 week.  Do not drink alcohol for 24 hours after your procedure. Keep all follow-up visits as told by your health care provider. This is important. Dental Work: You will require antibiotics prior to any dental work, including cleanings, for 6 months after your Watchman implantation to help protect you from infection. After 6 months, antibiotics are no longer  required. Contact a health care provider if: You have redness, mild swelling, or pain around your puncture site. You have soreness in your throat or at your puncture site that does not improve after several days You have fluid or blood coming from your puncture site that stops after applying firm pressure to the area. Your puncture site feels warm to the touch. You have pus or a bad smell coming from your puncture site. You have a fever. You have chest pain or discomfort that spreads to your neck, jaw, or arm. You are sweating a lot. You feel nauseous. You have a fast or irregular heartbeat. You have shortness of breath. You are dizzy or light-headed and feel the need to lie down. You have pain or numbness in the arm or leg closest to your puncture site. Get help right away if: Your puncture site suddenly swells. Your puncture site is bleeding and the bleeding does not stop after applying firm pressure to the area. These symptoms may represent a serious problem that is an emergency. Do not wait to see if the symptoms will go away. Get medical help right away. Call your local emergency services (911 in the [place] Do not drive yourself to the hospital. Summary After the procedure, it is normal to have bruising and tenderness at the puncture site in your groin, neck, or forearm. Check your puncture site every day for signs of infection. Get help right away if your puncture site is bleeding and the bleeding does not stop after applying firm pressure to the area. This is a medical emergency.  This information is not intended to replace advice given to you by your health care provider. Make sure you discuss any questions you have with your health care provider.

## 2023-06-18 NOTE — Discharge Summary (Signed)
 Electrophysiology Discharge Summary   Patient ID: [name],  MRN: [id number], DOB/AGE: [birth year] 78 y.o.  Admit date: 06/18/2023 Discharge date: 06/19/2023  Primary Care Physician: Joycelyn Rua, MD  Primary Cardiologist: Tessa Lerner, DO  Electrophysiologist: Lanier Prude, MD  Primary Discharge Diagnosis:  Paroxysmal Atrial Fibrillation Poor candidacy for long term anticoagulation due to refusal of long-term oral anticoagulation due to cost of medication.   Secondary Discharge Diagnosis:  Complete Heart Block  Chronic Diastolic CHF   Procedures This Admission:  Transeptal Puncture Intra-procedural TEE which showed no LAA thrombus Left atrial appendage occlusive device placement on 06/18/23 by Dr. Lalla Brothers.  CONCLUSIONS:  1.Successful implantation of a WATCHMAN left atrial appendage occlusive device    2. TEE demonstrating no LAA thrombus 3. No early apparent complications.  ---- Post Implant Anticoagulation Strategy: Continue Eliquis 5mg  by mouth twice daily for 45 days after implant. After 45 days, stop Eliquis and start Plavix 75mg  by mouth once daily to complete 6 months of post implant medical therapy. Plan for CT scan 60 days after implant to assess appendage patency and Watchman position.  Brief HPI: [name] is a 78 y.o. female with a history of Paroxysmal Atrial Fibrillation, CHB s/p PPM (11/2022), breast CA s/p bilateral mastectomies, obesity, DM II, HTN, severe LFLG aortic stenosis s/p TAVR (11/2022), who was referred to Electrophysiology in the outpatient setting.     Hospital Course:  The patient was admitted and underwent left atrial appendage occlusive device placement as above.  The patient was monitored overnight and has done very well with no concerns. Groin site has been stable without evidence of hematoma or bleeding. Wound care and restrictions were reviewed with the patient.   The patient has been scheduled for post procedure follow up with the  EP team in approximately 6 weeks. Planned for  Eliquis and continue for 45 days then stop. At that time she will transition to Plavix 75mg  daily to complete 6 months of therapy.  They will require dental SBE for 6 month post op and should refrain from dental work or cleanings for the first 30 days post implant. SBE to be RXd at follow up.   A repeat CT scan will be performed in approximately 60 days to ensure proper seal of the device.    Physical Exam: Vitals:   06/18/23 1933 06/18/23 2245 06/19/23 0520 06/19/23 0654  BP: 134/70 109/72 (!) 123/50 (!) 106/95  Pulse: 85 77 80 (!) 118  Resp: 19 18 16 20   Temp: 98 F (36.7 C) 98.3 F (36.8 C) 98.5 F (36.9 C) 98.5 F (36.9 C)  TempSrc: Oral Oral Oral Oral  SpO2: 94% 96% 95% 93%  Weight:      Height:        GEN: Well nourished, well developed in no acute distress NECK: No JVD; No carotid bruits CARDIAC: Regular rate and rhythm, no murmurs, rubs, gallops RESPIRATORY:  Clear to auscultation without rales, wheezing or rhonchi  ABDOMEN: Soft, non-tender, non-distended EXTREMITIES:  No edema; No deformity. Groin site is stable, no bruit, hematoma, bleeding     Discharge Medications:  Allergies as of 06/19/2023       Reactions   Penicillins Shortness Of Breath, Rash   Bee Venom Swelling   Bee stings   Morphine Other (See Comments), Nausea And Vomiting        Medication List     TAKE these medications    ADVANCED COLLAGEN PO Take 1 capsule by mouth  daily.   albuterol 108 (90 Base) MCG/ACT inhaler Commonly known as: VENTOLIN HFA Inhale 2 puffs into the lungs every 4 (four) hours as needed for wheezing or shortness of breath.   apixaban 5 MG Tabs tablet Commonly known as: ELIQUIS Take 1 tablet (5 mg total) by mouth 2 (two) times daily.   B COMPLEX PO Take 1 capsule by mouth daily.   Farxiga 10 MG Tabs tablet Generic drug: dapagliflozin propanediol Take 1 tablet (10 mg total) by mouth daily before breakfast.    lisinopril-hydrochlorothiazide 20-12.5 MG tablet Commonly known as: ZESTORETIC Take 1 tablet by mouth daily.   Magnesium 400 MG Caps Take 400 mg by mouth daily.   MULTIVITAMINS PO Take 1 tablet by mouth daily.   naproxen sodium 220 MG tablet Commonly known as: ALEVE Take 220 mg by mouth daily.   Ozempic (0.25 or 0.5 MG/DOSE) 2 MG/3ML Sopn Generic drug: Semaglutide(0.25 or 0.5MG /DOS) Inject 0.5 mg into the skin every Saturday.   pantoprazole 40 MG tablet Commonly known as: PROTONIX Take 40 mg by mouth daily.   polyvinyl alcohol 1.4 % ophthalmic solution Commonly known as: LIQUIFILM TEARS Place 1 drop into both eyes daily.   potassium gluconate 595 (99 K) MG Tabs tablet Take 595 mg by mouth daily.   rosuvastatin 5 MG tablet Commonly known as: CRESTOR Take 5 mg by mouth daily.   torsemide 10 MG tablet Commonly known as: DEMADEX Take 1 tablet (10 mg total) by mouth every other day.   Vitamin D3 125 MCG (5000 UT) Tabs Take 5,000 Units by mouth daily.        Disposition: Home Discharge Instructions     Diet - low sodium heart healthy   Complete by: As directed    Increase activity slowly   Complete by: As directed         Duration of Discharge Encounter:  APP Time:   Signed, Francis Dowse, PA-C

## 2023-06-18 NOTE — Anesthesia Preprocedure Evaluation (Addendum)
 Anesthesia Evaluation  Patient identified by MRN, date of birth, ID band Patient awake    Reviewed: Allergy & Precautions, H&P , NPO status , Patient's Chart, lab work & pertinent test results  Airway Mallampati: II  TM Distance: >3 FB Neck ROM: Full    Dental no notable dental hx. (+) Upper Dentures, Lower Dentures, Dental Advisory Given   Pulmonary asthma , former smoker   Pulmonary exam normal breath sounds clear to auscultation       Cardiovascular hypertension, Pt. on medications +CHF  + dysrhythmias Atrial Fibrillation  Rhythm:Regular Rate:Normal     Neuro/Psych negative neurological ROS  negative psych ROS   GI/Hepatic Neg liver ROS, hiatal hernia,GERD  ,,  Endo/Other  diabetes, Type 2    Renal/GU negative Renal ROS  negative genitourinary   Musculoskeletal   Abdominal   Peds  Hematology negative hematology ROS (+)   Anesthesia Other Findings   Reproductive/Obstetrics negative OB ROS                             Anesthesia Physical Anesthesia Plan  ASA: 3  Anesthesia Plan: General   Post-op Pain Management: Tylenol PO (pre-op)*   Induction: Intravenous  PONV Risk Score and Plan: 4 or greater and Ondansetron, Dexamethasone and Treatment may vary due to age or medical condition  Airway Management Planned: Oral ETT  Additional Equipment:   Intra-op Plan:   Post-operative Plan: Extubation in OR  Informed Consent: I have reviewed the patients History and Physical, chart, labs and discussed the procedure including the risks, benefits and alternatives for the proposed anesthesia with the patient or authorized representative who has indicated his/her understanding and acceptance.     Dental advisory given  Plan Discussed with: CRNA  Anesthesia Plan Comments:        Anesthesia Quick Evaluation

## 2023-06-18 NOTE — Transfer of Care (Signed)
 Immediate Anesthesia Transfer of Care Note  Patient: [name]  Procedure(s) Performed: LEFT ATRIAL APPENDAGE OCCLUSION TRANSESOPHAGEAL ECHOCARDIOGRAM  Patient Location: Cath Lab  Anesthesia Type:General  Level of Consciousness: drowsy and patient cooperative  Airway & Oxygen Therapy: Patient Spontanous Breathing and Patient connected to nasal cannula oxygen  Post-op Assessment: Report given to RN and Post -op Vital signs reviewed and stable  Post vital signs: Reviewed and stable  Last Vitals:  Vitals Value Taken Time  BP 149/74 06/18/23 1449  Temp    Pulse 76 06/18/23 1452  Resp 15 06/18/23 1452  SpO2 93 % 06/18/23 1452  Vitals shown include unfiled device data.  Last Pain:  Vitals:   06/18/23 1108  TempSrc:   PainSc: 0-No pain      Patients Stated Pain Goal: 0 (06/18/23 1108)  Complications: There were no known notable events for this encounter.

## 2023-06-18 NOTE — Plan of Care (Cosign Needed)

## 2023-06-19 ENCOUNTER — Inpatient Hospital Stay (HOSPITAL_COMMUNITY)

## 2023-06-19 DIAGNOSIS — I110 Hypertensive heart disease with heart failure: Secondary | ICD-10-CM | POA: Diagnosis not present

## 2023-06-19 DIAGNOSIS — E8582 Wild-type transthyretin-related (ATTR) amyloidosis: Secondary | ICD-10-CM | POA: Diagnosis not present

## 2023-06-19 DIAGNOSIS — Z006 Encounter for examination for normal comparison and control in clinical research program: Secondary | ICD-10-CM | POA: Diagnosis not present

## 2023-06-19 DIAGNOSIS — I480 Paroxysmal atrial fibrillation: Secondary | ICD-10-CM | POA: Diagnosis not present

## 2023-06-19 MED FILL — Fentanyl Citrate Preservative Free (PF) Inj 100 MCG/2ML: INTRAMUSCULAR | Qty: 2 | Status: AC

## 2023-06-19 NOTE — Plan of Care (Signed)

## 2023-06-19 NOTE — Anesthesia Postprocedure Evaluation (Signed)
 Anesthesia Post Note  Patient: [name]  Procedure(s) Performed: LEFT ATRIAL APPENDAGE OCCLUSION TRANSESOPHAGEAL ECHOCARDIOGRAM     Patient location during evaluation: PACU Anesthesia Type: General Level of consciousness: awake and alert Pain management: pain level controlled Vital Signs Assessment: post-procedure vital signs reviewed and stable Respiratory status: spontaneous breathing, nonlabored ventilation, respiratory function stable and patient connected to nasal cannula oxygen Cardiovascular status: blood pressure returned to baseline and stable Postop Assessment: no apparent nausea or vomiting Anesthetic complications: no   There were no known notable events for this encounter.           Mariann Barter

## 2023-06-23 ENCOUNTER — Telehealth: Payer: Self-pay | Admitting: Cardiology

## 2023-06-23 ENCOUNTER — Telehealth: Payer: Self-pay

## 2023-06-23 NOTE — Telephone Encounter (Signed)
  [facility]   Watchman Team  Contacted the patient regarding discharge from [facility] on 06/19/2023  The patient understands to follow up with Otilio Saber on 08/05/2023  The patient understands discharge instructions? Yes  The patient understands medications and regimen? Yes   The patient reports groin site looks healthy with no S/S of infection or bleeding  The patient understands to call with any questions or concerns prior to scheduled visit.

## 2023-06-23 NOTE — Telephone Encounter (Signed)
 Received a page to call [name].  She was identified by name and date of birth.  She states that she was sitting down watching TV and checked her heart rate and noticed that it was 118 bpm.  Her blood pressure was 116/71.  She states that she was feeling weak throughout the day though this was not necessarily new for her as she underwent Watchman procedure on 3/6 and has not felt like she has regained her full energy baseline yet.  By the time I called, she stated that her heart rate was now less than 100 and her blood pressure was similar from prior and she feels completely at her baseline.  We discussed that it was likely that she was in atrial fibrillation at that time and it is possible that she is no longer in atrial fibrillation.  Since she is feeling completely asymptomatic with normal vitals, does not need urgent evaluation.  Reviewed reasons to present to the emergency room including persistent elevated heart rate especially in the setting of presyncope/syncope, chest discomfort, new significant dyspnea.  She was in agreement with this plan.  Fidela Juneau, MD

## 2023-06-23 NOTE — Telephone Encounter (Signed)
 Called to check on patient s/p LAAO and discharge 06/19/2023.  Left message to call back.

## 2023-06-30 NOTE — Progress Notes (Signed)
 Received fax from [facility] asking for clearance for this pt to restart virtual cardiopulmonary rehab after her Watchman procedure on 3/6. Spoke with Cline Crock, PA-C who stated it was okay to clear this pt and move forward with rehab. Dr. Odis Hollingshead has signed the form and it has been faxed back to [facility].

## 2023-07-01 DIAGNOSIS — Z09 Encounter for follow-up examination after completed treatment for conditions other than malignant neoplasm: Secondary | ICD-10-CM | POA: Diagnosis not present

## 2023-07-01 DIAGNOSIS — I480 Paroxysmal atrial fibrillation: Secondary | ICD-10-CM | POA: Diagnosis not present

## 2023-07-07 NOTE — Progress Notes (Signed)
 Remote pacemaker transmission.

## 2023-08-04 ENCOUNTER — Other Ambulatory Visit: Payer: Self-pay

## 2023-08-04 DIAGNOSIS — I480 Paroxysmal atrial fibrillation: Secondary | ICD-10-CM

## 2023-08-05 ENCOUNTER — Ambulatory Visit: Payer: Medicare HMO | Attending: Student | Admitting: Student

## 2023-08-05 ENCOUNTER — Encounter: Payer: Self-pay | Admitting: Student

## 2023-08-05 VITALS — BP 138/76 | HR 69 | Ht 64.0 in | Wt 202.2 lb

## 2023-08-05 DIAGNOSIS — I480 Paroxysmal atrial fibrillation: Secondary | ICD-10-CM

## 2023-08-05 DIAGNOSIS — Z952 Presence of prosthetic heart valve: Secondary | ICD-10-CM

## 2023-08-05 DIAGNOSIS — Z792 Long term (current) use of antibiotics: Secondary | ICD-10-CM | POA: Diagnosis not present

## 2023-08-05 DIAGNOSIS — I442 Atrioventricular block, complete: Secondary | ICD-10-CM | POA: Diagnosis not present

## 2023-08-05 DIAGNOSIS — Z95818 Presence of other cardiac implants and grafts: Secondary | ICD-10-CM | POA: Diagnosis not present

## 2023-08-05 DIAGNOSIS — I10 Essential (primary) hypertension: Secondary | ICD-10-CM | POA: Diagnosis not present

## 2023-08-05 LAB — CUP PACEART INCLINIC DEVICE CHECK
Battery Remaining Longevity: 116 mo
Battery Voltage: 2.99 V
Brady Statistic RA Percent Paced: 1.4 %
Brady Statistic RV Percent Paced: 99.88 %
Date Time Interrogation Session: 20250423122811
Implantable Lead Connection Status: 753985
Implantable Lead Connection Status: 753985
Implantable Lead Implant Date: 20240822
Implantable Lead Implant Date: 20240822
Implantable Lead Location: 753859
Implantable Lead Location: 753860
Implantable Pulse Generator Implant Date: 20240822
Lead Channel Impedance Value: 412.5 Ohm
Lead Channel Impedance Value: 475 Ohm
Lead Channel Pacing Threshold Amplitude: 0.625 V
Lead Channel Pacing Threshold Amplitude: 0.75 V
Lead Channel Pacing Threshold Pulse Width: 0.5 ms
Lead Channel Pacing Threshold Pulse Width: 0.5 ms
Lead Channel Sensing Intrinsic Amplitude: 3.3 mV
Lead Channel Sensing Intrinsic Amplitude: 7.8 mV
Lead Channel Setting Pacing Amplitude: 1 V
Lead Channel Setting Pacing Amplitude: 1.625
Lead Channel Setting Pacing Pulse Width: 0.5 ms
Lead Channel Setting Sensing Sensitivity: 4 mV
Pulse Gen Model: 2272
Pulse Gen Serial Number: 8206065

## 2023-08-05 MED ORDER — CLOPIDOGREL BISULFATE 75 MG PO TABS: 75.0000 mg | ORAL_TABLET | Freq: Every day | ORAL | 6 refills | Status: DC

## 2023-08-05 MED ORDER — AZITHROMYCIN 500 MG PO TABS: 500.0000 mg | ORAL_TABLET | Freq: Once | ORAL | 0 refills | Status: DC | PRN

## 2023-08-05 NOTE — Progress Notes (Signed)
  Electrophysiology Office Note:   ID:  Tayten, Heber 1945-11-29, MRN [id number]  Primary Cardiologist: Olinda Bertrand, DO Electrophysiologist: Boyce Byes, MD      History of Present Illness:   ANNICA MARINELLO is a 78 y.o. female with h/o CHB s/p PPM, diastolic CHF, HTN, and Atrial fib s/p Watchman procedure seen today for routine electrophysiology followup.   S/p Watchman 06/18/23. OAC plan: Continue Eliquis  5mg  by mouth twice daily for 45 days after implant. After 45 days, stop Eliquis  and start Plavix  75mg  by mouth once daily to complete 6 months of post implant medical therapy   Since last being seen in our clinic the patient reports doing well from the cardiac perspective. Had stomach virus last week. Otherwise, she denies chest pain,  dyspnea, PND, orthopnea, nausea, vomiting, syncope, edema, weight gain, or early satiety. She did have a brief episode of tachycardia and subsequent dizziness, but none since.   Review of systems complete and found to be negative unless listed in HPI.   EP Information / Studies Reviewed:    EKG is not ordered today. EKG from 06/19/2023 reviewed which showed AS-VP rhythm at 78 bpm        PPM Interrogation-  reviewed in detail today,  See PACEART report.  Arrhythmia/Device History Abbott Dual Chamber PPM 11/2022 for CHB   Physical Exam:   VS:  There were no vitals taken for this visit.   Wt Readings from Last 3 Encounters:  06/18/23 199 lb 3.2 oz (90.4 kg)  06/10/23 202 lb (91.6 kg)  04/30/23 201 lb (91.2 kg)     GEN: No acute distress  NECK: No JVD; No carotid bruits CARDIAC: Regular rate and rhythm, no murmurs, rubs, gallops RESPIRATORY:  Clear to auscultation without rales, wheezing or rhonchi  ABDOMEN: Soft, non-tender, non-distended EXTREMITIES:  No edema; No deformity   ASSESSMENT AND PLAN:    CHB s/p Medtronic PPM  Normal PPM function See Pace Art report No changes today  Persistent AF S/p Watchman 0% burden by device.   Stop Eliquis  Start Plavix  75 mg daily tomorrow CT pending, labs and letter given Dental prophylaxis reviewed and sent.   HTN Stable on current regimen   S/p TAVR Overall stable. Has annual follow up soon.     Disposition:   Follow up with EP APP in September.  Signed, Tylene Galla, PA-C

## 2023-08-05 NOTE — Patient Instructions (Signed)
 Medication Instructions:  1.Take azithromycin  500 mg one hr prior to all dental visits. 2.Take your last dose of eliquis  today 3.Take plavix  75 mg daily, starting tomorrow *If you need a refill on your cardiac medications before your next appointment, please call your pharmacy*  Lab Work: BMET-TODAY If you have labs (blood work) drawn today and your tests are completely normal, you will receive your results only by: MyChart Message (if you have MyChart) OR A paper copy in the mail If you have any lab test that is abnormal or we need to change your treatment, we will call you to review the results.  Follow-Up: At [facility], you and your health needs are our priority.  As part of our continuing mission to provide you with exceptional heart care, our providers are all part of one team.  This team includes your primary Cardiologist (physician) and Advanced Practice Providers or APPs (Physician Assistants and Nurse Practitioners) who all work together to provide you with the care you need, when you need it.  Your next appointment:   September 2025  Provider:   Bambi Lever "Jonelle Neri" [place], [place]      1st Floor: - Lobby - Registration  - Pharmacy  - Lab - Cafe  2nd Floor: - PV Lab - Diagnostic Testing (echo, CT, nuclear med)  3rd Floor: - Vacant  4th Floor: - TCTS (cardiothoracic surgery) - AFib Clinic - Structural Heart Clinic - Vascular Surgery  - Vascular Ultrasound  5th Floor: - HeartCare Cardiology (general and EP) - Clinical Pharmacy for coumadin, hypertension, lipid, weight-loss medications, and med management appointments    Valet parking services will be available as well.

## 2023-08-06 DIAGNOSIS — R300 Dysuria: Secondary | ICD-10-CM | POA: Diagnosis not present

## 2023-08-06 LAB — BASIC METABOLIC PANEL WITH GFR
BUN/Creatinine Ratio: 23 (ref 12–28)
BUN: 17 mg/dL (ref 8–27)
CO2: 28 mmol/L (ref 20–29)
Calcium: 9.6 mg/dL (ref 8.7–10.3)
Chloride: 98 mmol/L (ref 96–106)
Creatinine, Ser: 0.75 mg/dL (ref 0.57–1.00)
Glucose: 104 mg/dL — ABNORMAL HIGH (ref 70–99)
Potassium: 3.7 mmol/L (ref 3.5–5.2)
Sodium: 144 mmol/L (ref 134–144)
eGFR: 82 mL/min/{1.73_m2} (ref >59)

## 2023-08-07 ENCOUNTER — Other Ambulatory Visit: Payer: Self-pay

## 2023-08-09 ENCOUNTER — Encounter: Payer: Self-pay | Admitting: Cardiology

## 2023-08-24 ENCOUNTER — Ambulatory Visit (HOSPITAL_COMMUNITY)
Admission: RE | Admit: 2023-08-24 | Discharge: 2023-08-24 | Disposition: A | Discharge status: Home / Self Care | Source: Ambulatory Visit | Attending: Cardiology | Admitting: Cardiology

## 2023-08-24 DIAGNOSIS — I517 Cardiomegaly: Secondary | ICD-10-CM | POA: Diagnosis not present

## 2023-08-24 DIAGNOSIS — I700 Atherosclerosis of aorta: Secondary | ICD-10-CM | POA: Diagnosis not present

## 2023-08-24 DIAGNOSIS — I480 Paroxysmal atrial fibrillation: Secondary | ICD-10-CM | POA: Diagnosis not present

## 2023-08-24 DIAGNOSIS — I4891 Unspecified atrial fibrillation: Secondary | ICD-10-CM | POA: Diagnosis not present

## 2023-08-24 MED ORDER — IOHEXOL 350 MG/ML SOLN
100.0000 mL | Freq: Once | INTRAVENOUS | Status: AC | PRN
  Administered 2023-08-24: 100 mL via INTRAVENOUS

## 2023-08-27 ENCOUNTER — Ambulatory Visit: Payer: Self-pay

## 2023-09-03 ENCOUNTER — Other Ambulatory Visit: Payer: Self-pay | Admitting: Cardiology

## 2023-09-03 DIAGNOSIS — R0602 Shortness of breath: Secondary | ICD-10-CM

## 2023-09-04 ENCOUNTER — Ambulatory Visit (INDEPENDENT_AMBULATORY_CARE_PROVIDER_SITE_OTHER): Payer: Medicare HMO

## 2023-09-04 DIAGNOSIS — I442 Atrioventricular block, complete: Secondary | ICD-10-CM | POA: Diagnosis not present

## 2023-09-04 LAB — CUP PACEART REMOTE DEVICE CHECK
Battery Remaining Longevity: 116 mo
Battery Remaining Percentage: 95.5 %
Battery Voltage: 3.01 V
Brady Statistic AP VP Percent: 1 %
Brady Statistic AP VS Percent: 1 %
Brady Statistic AS VP Percent: 99 %
Brady Statistic AS VS Percent: 1 %
Brady Statistic RA Percent Paced: 1 %
Brady Statistic RV Percent Paced: 99 %
Date Time Interrogation Session: 20250523053523
Implantable Lead Connection Status: 753985
Implantable Lead Connection Status: 753985
Implantable Lead Implant Date: 20240822
Implantable Lead Implant Date: 20240822
Implantable Lead Location: 753859
Implantable Lead Location: 753860
Implantable Pulse Generator Implant Date: 20240822
Lead Channel Impedance Value: 430 Ohm
Lead Channel Impedance Value: 530 Ohm
Lead Channel Pacing Threshold Amplitude: 0.5 V
Lead Channel Pacing Threshold Amplitude: 0.875 V
Lead Channel Pacing Threshold Pulse Width: 0.5 ms
Lead Channel Pacing Threshold Pulse Width: 0.5 ms
Lead Channel Sensing Intrinsic Amplitude: 4.4 mV
Lead Channel Sensing Intrinsic Amplitude: 7.8 mV
Lead Channel Setting Pacing Amplitude: 1.125
Lead Channel Setting Pacing Amplitude: 1.5 V
Lead Channel Setting Pacing Pulse Width: 0.5 ms
Lead Channel Setting Sensing Sensitivity: 4 mV
Pulse Gen Model: 2272
Pulse Gen Serial Number: 8206065

## 2023-09-09 ENCOUNTER — Ambulatory Visit: Payer: Self-pay | Admitting: Cardiology

## 2023-10-13 NOTE — Progress Notes (Signed)
 Remote pacemaker transmission.

## 2023-10-19 DIAGNOSIS — E1159 Type 2 diabetes mellitus with other circulatory complications: Secondary | ICD-10-CM | POA: Diagnosis not present

## 2023-10-19 DIAGNOSIS — I10 Essential (primary) hypertension: Secondary | ICD-10-CM | POA: Diagnosis not present

## 2023-10-19 DIAGNOSIS — I350 Nonrheumatic aortic (valve) stenosis: Secondary | ICD-10-CM | POA: Diagnosis not present

## 2023-10-19 DIAGNOSIS — I4891 Unspecified atrial fibrillation: Secondary | ICD-10-CM | POA: Diagnosis not present

## 2023-11-12 DIAGNOSIS — E1159 Type 2 diabetes mellitus with other circulatory complications: Secondary | ICD-10-CM | POA: Diagnosis not present

## 2023-11-12 DIAGNOSIS — E782 Mixed hyperlipidemia: Secondary | ICD-10-CM | POA: Diagnosis not present

## 2023-11-12 DIAGNOSIS — I350 Nonrheumatic aortic (valve) stenosis: Secondary | ICD-10-CM | POA: Diagnosis not present

## 2023-11-12 DIAGNOSIS — I4891 Unspecified atrial fibrillation: Secondary | ICD-10-CM | POA: Diagnosis not present

## 2023-11-12 DIAGNOSIS — I10 Essential (primary) hypertension: Secondary | ICD-10-CM | POA: Diagnosis not present

## 2023-11-17 DIAGNOSIS — I350 Nonrheumatic aortic (valve) stenosis: Secondary | ICD-10-CM | POA: Diagnosis not present

## 2023-11-17 DIAGNOSIS — I10 Essential (primary) hypertension: Secondary | ICD-10-CM | POA: Diagnosis not present

## 2023-11-17 DIAGNOSIS — I4891 Unspecified atrial fibrillation: Secondary | ICD-10-CM | POA: Diagnosis not present

## 2023-11-17 DIAGNOSIS — E1159 Type 2 diabetes mellitus with other circulatory complications: Secondary | ICD-10-CM | POA: Diagnosis not present

## 2023-11-24 NOTE — Progress Notes (Unsigned)
 [facility]   MULTIDISCIPLINARY HEART VALVE CLINIC                                     Cardiology Office Note:    Date:  11/30/2023   ID:  [name], DOB 1945/12/17, MRN [id number]  PCP:  Nanci Senior, MD  [facility] Cardiologist:  Madonna Large, DO  [facility] Structural heart: Lurena MARLA Red, MD [facility] Electrophysiologist:  OLE ONEIDA HOLTS, MD   Referring MD: Nanci Senior, MD   1 year follow up s/p TAVR  History of Present Illness:    [name] is a 78 y.o. female with a hx of breast cancer s/p bilateral mastectomies (1998 and then 2007 with cancer recurrence), morbid obesity, former tobacco abuse, ventricular tachycardia, DMT2, HTN, severe LFLG aortic stenosis s/p TAVR (12/02/22), CHB s/p PPM (12/04/22)  and device detected PAF s/p LAAO  (06/18/23) who presents to clinic for follow up.   She reported progressive exertional shortness of breath, fatigue and generalized weakness in 06/2022. Echo in 08/2022 showed aortic stenosis, but it was not possible to determine the severity due to poor windows related to her mastectomies and reconstructions. TEE on 10/14/2022 showed EF 55% and severe LFLG AS with mean grad 31 mmHg, AVA 0.81 cm2, SVI 32 and mild-mod MR/TR. [facility] 10/14/22 showed no significant coronary artery disease. S/p TAVR with a 26 mm Edwards Sapien 3 Ultra Resilia THV via the TF approach on 12/02/22. Post operative echo showed stable valve function and gradients with a 26mm S3UR with an AVA at 3.17cm2 and mean gradient at . She was discharged with ZIO monitor due to underlying RBBB/LAFB, which showed CHB requiring PPM placement with St Jude dual chamber with Dr. HOLTS 12/04/22. Her device showed afib and ASA was transitioned to Eliquis . Follow up PYP scan was equivocal for cardiac amyloid and seen by Dr. Rolan. cMRI was not highly suggestive of cardiac amyloidosis.    Today the patient presents to clinic for follow up.  Here alone. Overall  doing well. No CP or SOB. Has occasional LE edema, but no orthopnea or PND. No dizziness or syncope. No blood in stool or urine. No palpitations.     Past Medical History:  Diagnosis Date   Allergic rhinitis    Asthma    Childhood per patient   Breast cancer, left (HCC) 2007   ductal carcinoma in situ, underwent left mastectomy 07/2005; reconstructive surgery with expander, permanent implant placed 03/2006, hospitalized January 2008 with breast cellulitis.   Breast cancer, right (HCC) 1998   1998; modified radical right mastectomy 1998 for infiltrating ductal adenocarcinoma with 7 of 12 axillary nodes positive. Underwent adjuvant chemotherapy and bone marrow transplant by Dr Seward as well as radiation therapy. Also had Tram flap reconstruction.   Diabetes mellitus without complication (HCC)    GERD (gastroesophageal reflux disease)    Hiatal hernia    History of ventricular tachycardia    Hypertension    Vitamin D deficiency      Current Medications: Current Meds  Medication Sig   albuterol  (VENTOLIN  HFA) 108 (90 Base) MCG/ACT inhaler Inhale 2 puffs into the lungs every 4 (four) hours as needed for wheezing or shortness of breath.   Cholecalciferol (VITAMIN D3) 125 MCG (5000 UT) TABS Take 5,000 Units by mouth daily.   clopidogrel  (PLAVIX ) 75 MG tablet Take 1 tablet (75 mg total) by  mouth daily.   FARXIGA 10 MG TABS tablet Take 1 tablet (10 mg total) by mouth daily before breakfast.   lisinopril-hydrochlorothiazide (ZESTORETIC) 20-12.5 MG tablet Take 1 tablet by mouth daily.   Magnesium 400 MG CAPS Take 400 mg by mouth daily.   Multiple Vitamin (MULTIVITAMINS PO) Take 1 tablet by mouth daily.   naproxen sodium (ALEVE) 220 MG tablet Take 220 mg by mouth daily.   OZEMPIC, 0.25 OR 0.5 MG/DOSE, 2 MG/3ML SOPN Inject 0.5 mg into the skin every Saturday.   pantoprazole (PROTONIX) 40 MG tablet Take 40 mg by mouth daily.   polyvinyl alcohol (LIQUIFILM TEARS) 1.4 % ophthalmic solution Place 1  drop into both eyes daily.   potassium chloride (KLOR-CON) 10 MEQ tablet 2 tablet Orally Twice a day   rosuvastatin (CRESTOR) 5 MG tablet Take 5 mg by mouth daily.   Specialty Vitamins Products (ADVANCED COLLAGEN PO) Take 1 capsule by mouth daily.   [DISCONTINUED] azithromycin (ZITHROMAX) 500 MG tablet Take 1 tablet (500 mg total) by mouth once as needed for up to 1 dose (Take 1 tablet (500 mg) 1 hr prior to any dental work. Only needed for first six months after WATCHMAN).   [DISCONTINUED] torsemide (DEMADEX) 10 MG tablet TAKE 1 TABLET EVERY MORNING (Patient taking differently: Take 10 mg by mouth every morning. Pt taking every day)      ROS:   Please see the history of present illness.    All other systems reviewed and are negative.  EKGs       Risk Assessment/Calculations:    CHA2DS2-VASc Score = 6   This indicates a 9.7% annual risk of stroke. The patient's score is based upon: CHF History: 1 HTN History: 1 Diabetes History: 1 Stroke History: 0 Vascular Disease History: 0 Age Score: 2 Gender Score: 1           Physical Exam:    VS:  BP 116/60   Pulse 74   Ht 5' 4 (1.626 m)   Wt 195 lb 3.2 oz (88.5 kg)   SpO2 96%   BMI 33.51 kg/m     Wt Readings from Last 3 Encounters:  11/30/23 195 lb 3.2 oz (88.5 kg)  08/05/23 202 lb 3.2 oz (91.7 kg)  06/18/23 199 lb 3.2 oz (90.4 kg)     GEN: Well nourished, well developed in no acute distress NECK: No JVD CARDIAC: RRR, no murmurs, rubs, gallops RESPIRATORY:  Clear to auscultation without rales, wheezing or rhonchi  ABDOMEN: Soft, non-tender, non-distended EXTREMITIES:  No edema; No deformity.*  ASSESSMENT:    1. S/P TAVR (transcatheter aortic valve replacement)   2. Paroxysmal A-fib (HCC)   3. Pacemaker   4. Chronic diastolic heart failure (HCC)   5. Essential hypertension   6. Presence of Watchman left atrial appendage closure device   7. Need for antibiotic prophylaxis for dental procedure   8. Shortness of  breath     PLAN:    In order of problems listed above:  Severe AS s/p TAVR:  -- Echo today shows EF 60%, moderate asymmetric LVH, normally functioning TAVR with a mean gradient of 7 mm hg and no PVL.  -- NYHA class I symptoms.  -- SBE prophylaxis discussed; I have RX'd azithromycin due to a PCN allergy.  -- Continue regular follow up with Dr. Michele.  PAF:  -- S/p LAAO with Watchman on 06/18/23 by Dr. Cindie. -- Continue on Plavix 75mg  daily for Watchman.    S/p PPM: -- Followed  by Dr. Cindie   Chronic diastolic CHF:  -- Appears euvolemic. -- Currently on Zestoric 20-12.5mg  daily, torsemide  10mg  every other day and Farixga 10mg  daily. -- Has some mild LE edema so change torsemide  to daily and check BMET in 2 weeks.    HTN: -- BP well controlled on Zestoric 20-12.5mg  daily and increase torsemide  to daily and check BMET in 2 weeks.    Medication Adjustments/Labs and Tests Ordered: Current medicines are reviewed at length with the patient today.  Concerns regarding medicines are outlined above.  Orders Placed This Encounter  Procedures   Basic Metabolic Panel (BMET)   Meds ordered this encounter  Medications   azithromycin  (ZITHROMAX ) 500 MG tablet    Sig: Take 1 tablet (500 mg total) by mouth once as needed for up to 1 dose (Take lifelong due to TAVR valve).    Dispense:  6 tablet    Refill:  6    Supervising Provider:   COOPER, MICHAEL [[id number]]   torsemide  (DEMADEX ) 10 MG tablet    Sig: Take 1 tablet (10 mg total) by mouth every morning. Pt taking every day    Dispense:  90 tablet    Refill:  3    Supervising Provider:   WONDA SHARPER [[id number]]    Patient Instructions  Medication Instructions:  Increase torsemide  10mg  from every other day to every day.      An antibiotic has been called into your pharmacy to take as needed for dental work. Please follow instructions on the bottle. This is required lifelong. *If you need a refill on your cardiac medications before  your next appointment, please call your pharmacy*  Lab Work: BMET in 2 weeks on 12/14/23 (lab slip printed for you) If you have labs (blood work) drawn today and your tests are completely normal, you will receive your results only by: MyChart Message (if you have MyChart) OR A paper copy in the mail If you have any lab test that is abnormal or we need to change your treatment, we will call you to review the results.  Testing/Procedures: none  Follow-Up: At [facility], you and your health needs are our priority.  As part of our continuing mission to provide you with exceptional heart care, our providers are all part of one team.  This team includes your primary Cardiologist (physician) and Advanced Practice Providers or APPs (Physician Assistants and Nurse Practitioners) who all work together to provide you with the care you need, when you need it.  Your next appointment:    Please continue regular follow up with your primary cardiologist.     Signed, Lamarr Hummer, PA-C  11/30/2023 2:52 PM    [facility]

## 2023-11-26 DIAGNOSIS — R319 Hematuria, unspecified: Secondary | ICD-10-CM | POA: Diagnosis not present

## 2023-11-26 DIAGNOSIS — M65331 Trigger finger, right middle finger: Secondary | ICD-10-CM | POA: Diagnosis not present

## 2023-11-26 DIAGNOSIS — N390 Urinary tract infection, site not specified: Secondary | ICD-10-CM | POA: Diagnosis not present

## 2023-11-26 DIAGNOSIS — Z6835 Body mass index (BMI) 35.0-35.9, adult: Secondary | ICD-10-CM | POA: Diagnosis not present

## 2023-11-30 ENCOUNTER — Ambulatory Visit (HOSPITAL_COMMUNITY)
Admission: RE | Admit: 2023-11-30 | Discharge: 2023-11-30 | Disposition: A | Discharge status: Home / Self Care | Payer: Medicare HMO | Source: Ambulatory Visit | Attending: Cardiovascular Disease | Admitting: Cardiovascular Disease

## 2023-11-30 ENCOUNTER — Ambulatory Visit: Payer: Medicare HMO | Admitting: Physician Assistant

## 2023-11-30 ENCOUNTER — Ambulatory Visit: Payer: Self-pay | Admitting: Physician Assistant

## 2023-11-30 VITALS — BP 116/60 | HR 74 | Ht 64.0 in | Wt 195.2 lb

## 2023-11-30 DIAGNOSIS — R0602 Shortness of breath: Secondary | ICD-10-CM | POA: Diagnosis not present

## 2023-11-30 DIAGNOSIS — Z95818 Presence of other cardiac implants and grafts: Secondary | ICD-10-CM | POA: Insufficient documentation

## 2023-11-30 DIAGNOSIS — Z950 Presence of cardiac pacemaker: Secondary | ICD-10-CM

## 2023-11-30 DIAGNOSIS — Z792 Long term (current) use of antibiotics: Secondary | ICD-10-CM | POA: Diagnosis not present

## 2023-11-30 DIAGNOSIS — I10 Essential (primary) hypertension: Secondary | ICD-10-CM | POA: Diagnosis not present

## 2023-11-30 DIAGNOSIS — Z952 Presence of prosthetic heart valve: Secondary | ICD-10-CM | POA: Insufficient documentation

## 2023-11-30 DIAGNOSIS — I480 Paroxysmal atrial fibrillation: Secondary | ICD-10-CM | POA: Diagnosis not present

## 2023-11-30 DIAGNOSIS — I5032 Chronic diastolic (congestive) heart failure: Secondary | ICD-10-CM

## 2023-11-30 LAB — ECHOCARDIOGRAM COMPLETE
AR max vel: 1.28 cm2
AV Area VTI: 1.52 cm2
AV Area mean vel: 1.39 cm2
AV Mean grad: 7 mmHg
AV Peak grad: 14.7 mmHg
Ao pk vel: 1.92 m/s
Area-P 1/2: 4.54 cm2
S' Lateral: 2.8 cm

## 2023-11-30 MED ORDER — AZITHROMYCIN 500 MG PO TABS
500.0000 mg | ORAL_TABLET | Freq: Once | ORAL | 6 refills | Status: AC | PRN
Start: 2023-11-30 — End: ?

## 2023-11-30 MED ORDER — TORSEMIDE 10 MG PO TABS: 10.0000 mg | ORAL_TABLET | Freq: Every morning | ORAL | 3 refills | Status: DC

## 2023-11-30 NOTE — Patient Instructions (Signed)
 Medication Instructions:  Increase torsemide  10mg  from every other day to every day.      An antibiotic has been called into your pharmacy to take as needed for dental work. Please follow instructions on the bottle. This is required lifelong. *If you need a refill on your cardiac medications before your next appointment, please call your pharmacy*  Lab Work: BMET in 2 weeks on 12/14/23 (lab slip printed for you) If you have labs (blood work) drawn today and your tests are completely normal, you will receive your results only by: MyChart Message (if you have MyChart) OR A paper copy in the mail If you have any lab test that is abnormal or we need to change your treatment, we will call you to review the results.  Testing/Procedures: none  Follow-Up: At [facility], you and your health needs are our priority.  As part of our continuing mission to provide you with exceptional heart care, our providers are all part of one team.  This team includes your primary Cardiologist (physician) and Advanced Practice Providers or APPs (Physician Assistants and Nurse Practitioners) who all work together to provide you with the care you need, when you need it.  Your next appointment:    Please continue regular follow up with your primary cardiologist.

## 2023-12-04 ENCOUNTER — Ambulatory Visit (INDEPENDENT_AMBULATORY_CARE_PROVIDER_SITE_OTHER): Payer: Medicare HMO

## 2023-12-04 DIAGNOSIS — I442 Atrioventricular block, complete: Secondary | ICD-10-CM | POA: Diagnosis not present

## 2023-12-04 LAB — CUP PACEART REMOTE DEVICE CHECK
Battery Remaining Longevity: 114 mo
Battery Remaining Percentage: 93 %
Battery Voltage: 2.99 V
Brady Statistic AP VP Percent: 1 %
Brady Statistic AP VS Percent: 1 %
Brady Statistic AS VP Percent: 99 %
Brady Statistic AS VS Percent: 1 %
Brady Statistic RA Percent Paced: 1 %
Brady Statistic RV Percent Paced: 99 %
Date Time Interrogation Session: 20250822040014
Implantable Lead Connection Status: 753985
Implantable Lead Connection Status: 753985
Implantable Lead Implant Date: 20240822
Implantable Lead Implant Date: 20240822
Implantable Lead Location: 753859
Implantable Lead Location: 753860
Implantable Pulse Generator Implant Date: 20240822
Lead Channel Impedance Value: 400 Ohm
Lead Channel Impedance Value: 480 Ohm
Lead Channel Pacing Threshold Amplitude: 0.625 V
Lead Channel Pacing Threshold Amplitude: 0.75 V
Lead Channel Pacing Threshold Pulse Width: 0.5 ms
Lead Channel Pacing Threshold Pulse Width: 0.5 ms
Lead Channel Sensing Intrinsic Amplitude: 2.8 mV
Lead Channel Sensing Intrinsic Amplitude: 7.8 mV
Lead Channel Setting Pacing Amplitude: 1 V
Lead Channel Setting Pacing Amplitude: 1.625
Lead Channel Setting Pacing Pulse Width: 0.5 ms
Lead Channel Setting Sensing Sensitivity: 4 mV
Pulse Gen Model: 2272
Pulse Gen Serial Number: 8206065

## 2023-12-07 ENCOUNTER — Ambulatory Visit: Payer: Self-pay | Admitting: Cardiology

## 2023-12-13 DIAGNOSIS — E782 Mixed hyperlipidemia: Secondary | ICD-10-CM | POA: Diagnosis not present

## 2023-12-13 DIAGNOSIS — I10 Essential (primary) hypertension: Secondary | ICD-10-CM | POA: Diagnosis not present

## 2023-12-13 DIAGNOSIS — I4891 Unspecified atrial fibrillation: Secondary | ICD-10-CM | POA: Diagnosis not present

## 2023-12-13 DIAGNOSIS — E1159 Type 2 diabetes mellitus with other circulatory complications: Secondary | ICD-10-CM | POA: Diagnosis not present

## 2023-12-13 DIAGNOSIS — I350 Nonrheumatic aortic (valve) stenosis: Secondary | ICD-10-CM | POA: Diagnosis not present

## 2023-12-15 DIAGNOSIS — I5032 Chronic diastolic (congestive) heart failure: Secondary | ICD-10-CM | POA: Diagnosis not present

## 2023-12-16 ENCOUNTER — Ambulatory Visit: Payer: Self-pay | Admitting: Physician Assistant

## 2023-12-16 LAB — BASIC METABOLIC PANEL WITH GFR
BUN/Creatinine Ratio: 27 (ref 12–28)
BUN: 22 mg/dL (ref 8–27)
CO2: 24 mmol/L (ref 20–29)
Calcium: 9.5 mg/dL (ref 8.7–10.3)
Chloride: 99 mmol/L (ref 96–106)
Creatinine, Ser: 0.83 mg/dL (ref 0.57–1.00)
Glucose: 92 mg/dL (ref 70–99)
Potassium: 4.2 mmol/L (ref 3.5–5.2)
Sodium: 141 mmol/L (ref 134–144)
eGFR: 72 mL/min/1.73 (ref >59)

## 2023-12-17 ENCOUNTER — Telehealth: Payer: Self-pay

## 2023-12-17 DIAGNOSIS — R0602 Shortness of breath: Secondary | ICD-10-CM

## 2023-12-17 MED ORDER — TORSEMIDE 10 MG PO TABS: 10.0000 mg | ORAL_TABLET | ORAL | Status: DC

## 2023-12-17 NOTE — Telephone Encounter (Signed)
 The patient reports low BP and high HR since yesterday. HR ranging from 108-120 with minimal exertion vacuuming. BP ~100/50. Currently, her BP is 120/48 and HR 108 (she checked on the phone).  She feels fine now, but has had intermittent episodes of dizziness, lightheadedness, and sweating. She denied CP. She has lost 4 pounds since her visit with Izetta Hummer last week.  Discussed with Izetta Hummer. Instructed the patient to decrease torsemide  back to every other day. She will monitor BP and HR and call if symptoms worsen. Scheduled her for sooner follow-up with Dr. Michele next week on 12/24/2023. The patient was grateful for call and agreed with plan.

## 2023-12-24 ENCOUNTER — Ambulatory Visit: Attending: Cardiology | Admitting: Cardiology

## 2023-12-24 ENCOUNTER — Encounter: Payer: Self-pay | Admitting: Cardiology

## 2023-12-24 VITALS — BP 134/78 | HR 74 | Resp 16 | Ht 64.0 in | Wt 200.0 lb

## 2023-12-24 DIAGNOSIS — Z950 Presence of cardiac pacemaker: Secondary | ICD-10-CM | POA: Diagnosis not present

## 2023-12-24 DIAGNOSIS — I480 Paroxysmal atrial fibrillation: Secondary | ICD-10-CM

## 2023-12-24 DIAGNOSIS — Z79899 Other long term (current) drug therapy: Secondary | ICD-10-CM

## 2023-12-24 DIAGNOSIS — I10 Essential (primary) hypertension: Secondary | ICD-10-CM

## 2023-12-24 DIAGNOSIS — Z95818 Presence of other cardiac implants and grafts: Secondary | ICD-10-CM

## 2023-12-24 DIAGNOSIS — R9431 Abnormal electrocardiogram [ECG] [EKG]: Secondary | ICD-10-CM

## 2023-12-24 DIAGNOSIS — I350 Nonrheumatic aortic (valve) stenosis: Secondary | ICD-10-CM | POA: Diagnosis not present

## 2023-12-24 DIAGNOSIS — Z952 Presence of prosthetic heart valve: Secondary | ICD-10-CM | POA: Diagnosis not present

## 2023-12-24 DIAGNOSIS — Z792 Long term (current) use of antibiotics: Secondary | ICD-10-CM | POA: Diagnosis not present

## 2023-12-24 DIAGNOSIS — E119 Type 2 diabetes mellitus without complications: Secondary | ICD-10-CM

## 2023-12-24 DIAGNOSIS — I442 Atrioventricular block, complete: Secondary | ICD-10-CM

## 2023-12-24 DIAGNOSIS — I5032 Chronic diastolic (congestive) heart failure: Secondary | ICD-10-CM

## 2023-12-24 MED ORDER — TORSEMIDE 10 MG PO TABS: 10.0000 mg | ORAL_TABLET | Freq: Every day | ORAL | 1 refills | Status: AC

## 2023-12-24 MED ORDER — LOSARTAN POTASSIUM 25 MG PO TABS
25.0000 mg | ORAL_TABLET | Freq: Every day | ORAL | 6 refills | Status: DC
Start: 2023-12-24 — End: 2023-12-24

## 2023-12-24 MED ORDER — LOSARTAN POTASSIUM 25 MG PO TABS: 25.0000 mg | ORAL_TABLET | Freq: Every day | ORAL | 6 refills | Status: DC

## 2023-12-24 NOTE — Patient Instructions (Signed)
 Medication Instructions:  Your physician has recommended you make the following change in your medication:  STOP Lisinopril -Hydrochlorothiazide  Change the way you take Torsemide  -- take daily START Losartan  25 mg every evening  *If you need a refill on your cardiac medications before your next appointment, please call your pharmacy*  Lab Work: Your physician recommends that you return for lab work in: 1 week for a BMP & BNP  If you have any lab test that is abnormal or we need to change your treatment, we will call you to review the results.  Testing/Procedures: Your physician recommends amyloid testing.   Follow-Up: At [facility], you and your health needs are our priority.  As part of our continuing mission to provide you with exceptional heart care, our providers are all part of one team.  This team includes your primary Cardiologist (physician) and Advanced Practice Providers or APPs (Physician Assistants and Nurse Practitioners) who all work together to provide you with the care you need, when you need it.   You have been referred to pharmD   Your next appointment:   6 month(s)  Provider:   Madonna Large, DO    Thank you for choosing [facility]!!   [phone]   Other Instructions  Losartan  Tablets What is this medication? LOSARTAN  (loe SAR tan) treats high blood pressure. It may also be used to prevent a stroke in people with heart disease and high blood pressure. It can be used to prevent kidney damage in people with diabetes. It works by relaxing the blood vessels, which helps decrease the amount of work your heart has to do. It belongs to a group of medications called ARBs. This medicine may be used for other purposes; ask your health care provider or pharmacist if you have questions. COMMON BRAND NAME(S): Cozaar  What should I tell my care team before I take this medication? They need to know if you have any of these conditions: Diabetes Heart  failure Kidney disease Liver disease An unusual or allergic reaction to losartan , other medications, food, dyes, or preservatives Pregnant or trying to get pregnant Breastfeeding How should I use this medication? Take this medication by mouth. Take it as directed on the prescription label at the same time every day. You can take it with or without food. If it upsets your stomach, take it with food. Keep taking it unless your care team tells you to stop. Talk to your care team about the use of this medication in children. While it may be prescribed for children as young as 6 years for selected conditions, precautions do apply. Overdosage: If you think you have taken too much of this medicine contact a poison control center or emergency room at once. NOTE: This medicine is only for you. Do not share this medicine with others. What if I miss a dose? If you miss a dose, take it as soon as you can. If it is almost time for your next dose, take only that dose. Do not take double or extra doses. What may interact with this medication? Do not take this medication with any of the following: Sparsentan Tranylcypromine This medication may also interact with the following: ACE inhibitors, such as benazepril, enalapril, lisinopril  Aliskiren Lithium NSAIDs, medications for pain and inflammation, such as ibuprofen or naproxen Potassium salts or supplements Some diuretics This list may not describe all possible interactions. Give your health care provider a list of all the medicines, herbs, non-prescription drugs, or dietary supplements you use.  Also tell them if you smoke, drink alcohol, or use illegal drugs. Some items may interact with your medicine. What should I watch for while using this medication? Visit your care team for regular checks on your progress. Check your blood pressure as directed. Know what your blood pressure should be and when to contact your care team. This medication may affect  your coordination, reaction time, or judgment. Do not drive or operate machinery until you know how this medication affects you. Sit up or stand slowly to reduce the risk of dizzy or fainting spells. Drinking alcohol with this medication can increase the risk of these side effects. Do not treat yourself for coughs, colds, or pain while you are using this medication without asking your care team for advice. Some medications may increase your blood pressure. Taking this medication is only part of a total heart healthy program. Ask your care team if there are other changes you can make to improve your overall health. Avoid salt substitutes unless you are told otherwise by your care team. Talk to your care team if you may be pregnant. Serious fetal side effects can occur if you take this medication during the second and third trimesters. Discuss other treatment options with your care team. There are benefits and risks to taking medications during pregnancy. Your care team can help you find the option that works for you. Do not breastfeed while taking this medication and for 2 days after the last dose. What side effects may I notice from receiving this medication? Side effects that you should report to your care team as soon as possible: Allergic reactions--skin rash, itching, hives, swelling of the face, lips, tongue, or throat High potassium level--muscle weakness, fast or irregular heartbeat Kidney injury--decrease in the amount of urine, swelling of the ankles, hands, or feet Low blood pressure--dizziness, feeling faint or lightheaded, blurry vision Side effects that usually do not require medical attention (report to your care team if they continue or are bothersome): Dizziness Headache Runny or stuffy nose This list may not describe all possible side effects. Call your doctor for medical advice about side effects. You may report side effects to FDA at 1-800-FDA-1088. Where should I keep my  medication? Keep out of the reach of children and pets. Store at room temperature between 20 and 25 degrees C (68 and 77 degrees F). Protect from light. Keep the container tightly closed. Get rid of any unused medication after the expiration date. To get rid of medications that are no longer needed or have expired: Take the medication to a take-back program. Check with your pharmacy or law enforcement to find a location. If you cannot return the medication, check the label or package insert to see if the medication should be thrown out in the garbage or flushed down the toilet. If you are not sure, ask your care team. If it is safe to put in the trash, empty the medication out of the container. Mix it with cat litter, dirt, coffee grounds, or another unwanted substance. Seal the mixture in a bag or container. Put it in the trash. NOTE: This sheet is a summary. It may not cover all possible information. If you have questions about this medicine, talk to your doctor, pharmacist, or health care provider.  2025 Elsevier/Gold Standard (2023-07-06 00:00:00)

## 2023-12-24 NOTE — Progress Notes (Signed)
 Cardiology Office Note:  .   Date:  01/02/2024  ID:  [name], DOB March 10, 1946, MRN [id number] PCP:  Nanci Senior, MD  [facility] Providers Cardiologist:  Madonna Large, [facility] (established care 09/04/2022)  Electrophysiologist:  OLE ONEIDA HOLTS, MD  Structural Heart:  Arun K Thukkani, MD,  Click to update primary MD,subspecialty MD or APP then REFRESH:1}    History of Present Illness: .   [name] is a 78 y.o. Caucasian female whose past medical history and cardiovascular risk factors includes: Aortic stenosis s/p 26 mm Edwards SAPIEN 3 via TF approach, status post pacemaker implant due to complete heart block (August 2024) with Dr. HOLTS), device detected paroxysmal atrial fibrillation s/p LAAO (06/2023), diabetes mellitus type 2, HTN, former smoker, hx breast cancer x2 (status postmastectomy, radiation, chemotherapy), history of ventricular tachycardia.   Referred to the practice for evaluation of shortness of breath.  She was noted to have aortic stenosis and she underwent 26 mm Edwards SAPIEN TAVR valve implant on December 10, 2022 with Dr. Wendel.   Post TAVR workup she was also noted to have complete heart block underwent pacemaker implant with Dr. HOLTS and is enrolled into the pacemaker clinic.  She is also noted to have paroxysmal atrial fibrillation after the device implant and was placed on anticoagulation for thromboembolic prophylaxis.  Thereafter, patient did undergo left atrial appendage occlusion device implant.  She is no longer on Eliquis .  Since last office visit patient had a PYP study in October 2024 and the study was concerning for cardiac ATTR amyloidosis.  She was seen by advanced heart failure, Dr. Rolan, who felt that scan was equivocal.  Patient was recommended to have a cardiac MR. Cardiac MRI was not highly suggestive of cardiac amyloidosis.  Since last office visit she has been evaluated by EP APP given her pacemaker as well as structural  heart team given her recent TAVR implant.   At her last appointment with Izetta Hummer on 11/30/2023 GDMT was uptitrated from torsemide  10 mg every other day to daily use for better volume management.  However, patient had called on 9//2025 due to concerns for elevated heart rate and soft blood pressures.  Torsemide  was reduced back down to every other day and patient presents today for follow-up.    Patient denies anginal chest pain. In the recent past patient had gained weight such that the home weights were close to 200 pounds. With uptitration of diuretics patient feels that she is back to baseline and at times may be having softer blood pressures.  In addition, she takes all of her GDMT in the morning which may also be contributory. No change in physical endurance.  Review of Systems: .   Review of Systems  Constitutional: Positive for weight gain.  Cardiovascular:  Positive for dyspnea on exertion and leg swelling. Negative for chest pain, claudication, irregular heartbeat, near-syncope, orthopnea, palpitations, paroxysmal nocturnal dyspnea and syncope.  Respiratory:  Negative for shortness of breath.   Hematologic/Lymphatic: Negative for bleeding problem.    Studies Reviewed:   EKG: EKG Interpretation Date/Time:  Thursday December 24 2023 08:11:59 EDT Ventricular Rate:  80 PR Interval:  186 QRS Duration:  140 QT Interval:  438 QTC Calculation: 505 R Axis:   162  Text Interpretation: Normal sinus rhythm Right axis deviation Non-specific intra-ventricular conduction block Cannot rule out Anteroseptal infarct , age undetermined When compared with ECG of 19-Jun-2023 06:58, Sinus rhythm has replaced Electronic ventricular pacemaker Confirmed by Large Madonna (  [id number]) on 12/24/2023 8:14:43 AM  Echocardiogram: 01/12/2023 (finger print)  1. Left ventricular ejection fraction, by estimation, is 55%. The left  ventricle has normal function. Left ventricular endocardial border not   optimally defined to evaluate regional wall motion. There is mild  concentric left ventricular hypertrophy. Left   ventricular diastolic parameters are consistent with Grade I diastolic  dysfunction (impaired relaxation).   2. Right ventricular systolic function is mildly reduced. The right  ventricular size is normal. There is normal pulmonary artery systolic  pressure. The estimated right ventricular systolic pressure is 27.0 mmHg.   3. Left atrial size was mildly dilated.   4. The mitral valve is normal in structure. Trivial mitral valve  regurgitation. No evidence of mitral stenosis.   5. Bioprosthetic aortic valve s/p TAVR with 26 mm Edwards Sapien THV.  Very poor images, mean gradient 4 mmHg is likely inaccurate. No  peri-valvular leakage noted.   6. The inferior vena cava is dilated in size with >50% respiratory  variability, suggesting right atrial pressure of 8 mmHg.   7. Technically difficult study with very poor images. No apical windows.   11/30/2023  1. Left ventricular ejection fraction, by estimation, is 60 to 65%. The  left ventricle has normal function. Left ventricular endocardial border  not optimally defined to evaluate regional wall motion. There is moderate  asymmetric left ventricular  hypertrophy. Left ventricular diastolic parameters are indeterminate.   2. Right ventricular systolic function is normal. The right ventricular  size is normal. There is normal pulmonary artery systolic pressure.   3. Left atrial size was mildly dilated.   4. The mitral valve is grossly normal. No evidence of mitral valve  regurgitation.   5. The aortic valve has been repaired/replaced. Aortic valve  regurgitation is not visualized. No aortic stenosis is present. There is a  26 mm Sapien prosthetic (TAVR) valve present in the aortic position.  Procedure Date: 12/12/2022. Echo findings are  consistent with normal structure and function of the aortic valve  prosthesis. Aortic  valve mean gradient measures 7.0 mmHg.   6. The inferior vena cava is normal in size with greater than 50%  respiratory variability, suggesting right atrial pressure of 3 mmHg.   7. Technically difficult study- patient has poor subcostal imaging,  similar to other images in series.   Stress Testing: Regadenoson  Nuclear stress test 09/12/2022: There is a moderate sized moderate reversible defect in the lateral, inferior and infero-apical regions.  Overall LV systolic function is abnormal with regional wall motion abnormalities in the same distribution (RCA) Stress LV EF: 46%.  Nondiagnostic ECG stress. The heart rate response was consistent with Regadenoson .  No previous exam available for comparison. Intermediate risk study.   CCTA:  July 2023 1. Annular measurements support a 26 mm S3 or 29 mm Evolut Pro.   2. There is moderate subannular calcium  under the LCC extending to the AMVL.   3. Sufficient coronary to annulus distance.   4. Optimal Fluoroscopic Angle for Delivery: LAO 17 CRA 2   5. Dilated pulmonary artery suggestive of pulmonary hypertension.  Heart Catheterization: 10/14/2022 LM: Normal. No significant disease LAD: Normal. No significant disease Lcx: Dominant. No significant disease RCA: Nondominant. No significant disease   RA: 8 mmHg RV: 49/6 mmHg PA:46/25 mmHg, mPAP 31 mmHg PCW: 17 mmHg   CO: 5.3 L.min CI: 2.6 L/min/m2   Simultaneous LV-AO measurement using Langston cathteer Aortic valve mean PG 25 mmHg, AVA 0.94 cm2   Conclusion: No significant  coronary artery disease Mildly elevated filling pressures Mild PH, WHO Grp II Aortic stenosis, moderate by gradients, severe by area. In the correct context, aortic stenosis could possibly be paradoxically low flow low gradient. Alternatively, dyspnea could be due to mild PH, possible diastolic dysfunction. Recommend clinical correlation.   Carotid duplex: Carotid artery duplex 09/22/2022 Duplex suggests  stenosis in the right internal carotid artery (minimal). Duplex suggests stenosis in the left internal carotid artery (1-15%). Both vertebral arteries are patent with antegrade flow. No prior studies for comparison. Follow up in one year is appropriate if clinically indicated.   Risk Assessment/Calculations:   Click Here to Calculate/Change CHADS2VASc Score The patient's CHADS2-VASc score is 6, indicating a 9.7% annual risk of stroke. CHF History: Yes HTN History: Yes Diabetes History: Yes Stroke History: No Vascular Disease History: No  Labs:       Latest Ref Rng & Units 06/10/2023    4:06 PM 03/03/2023   10:19 AM 01/06/2023    1:55 PM  CBC  WBC 3.4 - 10.8 x10E3/uL 6.7  6.6  5.8   Hemoglobin 11.1 - 15.9 g/dL 85.4  86.3  86.7   Hematocrit 34.0 - 46.6 % 44.0  41.8  40.3   Platelets 150 - 450 x10E3/uL 170  178  178        Latest Ref Rng & Units 12/31/2023   10:56 AM 12/15/2023    2:04 PM 08/05/2023    1:08 PM  BMP  Glucose 70 - 99 mg/dL 894  92  895   BUN 8 - 27 mg/dL 21  22  17    Creatinine 0.57 - 1.00 mg/dL 9.13  9.16  9.24   BUN/Creat Ratio 12 - 28 24  27  23    Sodium 134 - 144 mmol/L 141  141  144   Potassium 3.5 - 5.2 mmol/L 4.2  4.2  3.7   Chloride 96 - 106 mmol/L 101  99  98   CO2 20 - 29 mmol/L 23  24  28    Calcium  8.7 - 10.3 mg/dL 9.6  9.5  9.6       Latest Ref Rng & Units 12/31/2023   10:56 AM 12/15/2023    2:04 PM 08/05/2023    1:08 PM  CMP  Glucose 70 - 99 mg/dL 894  92  895   BUN 8 - 27 mg/dL 21  22  17    Creatinine 0.57 - 1.00 mg/dL 9.13  9.16  9.24   Sodium 134 - 144 mmol/L 141  141  144   Potassium 3.5 - 5.2 mmol/L 4.2  4.2  3.7   Chloride 96 - 106 mmol/L 101  99  98   CO2 20 - 29 mmol/L 23  24  28    Calcium  8.7 - 10.3 mg/dL 9.6  9.5  9.6     No results found for: CHOL, HDL, LDLCALC, LDLDIRECT, TRIG, CHOLHDL No results for input(s): LIPOA in the last 8760 hours. No components found for: NTPROBNP No results for input(s): PROBNP in  the last 8760 hours. No results for input(s): TSH in the last 8760 hours.   Physical Exam:    Today's Vitals   12/24/23 0809  BP: 134/78  Pulse: 74  Resp: 16  SpO2: 93%  Weight: 200 lb (90.7 kg)  Height: 5' 4 (1.626 m)   Body mass index is 34.33 kg/m. Wt Readings from Last 3 Encounters:  12/24/23 200 lb (90.7 kg)  11/30/23 195 lb 3.2 oz (88.5 kg)  08/05/23  202 lb 3.2 oz (91.7 kg)    Physical Exam  Constitutional: No distress.  Appears older than stated age, ambulates with a cane,, hemodynamically stable.   Neck: No JVD present.  Cardiovascular: Normal rate, regular rhythm, S1 normal, S2 normal, intact distal pulses and normal pulses. Exam reveals no gallop, no S3 and no S4.  No murmur heard. Pulmonary/Chest: Effort normal and breath sounds normal. No stridor. She has no wheezes. She has no rales.  Bilateral mastectomies, left sided pacemaker.   Abdominal: Soft. Bowel sounds are normal. She exhibits no distension. There is no abdominal tenderness.  Musculoskeletal:        General: Edema present. No tenderness.     Cervical back: Neck supple.  Neurological: She is alert and oriented to person, place, and time. She has intact cranial nerves (2-12).  Skin: Skin is warm and moist.     Impression & Recommendation(s):  Impression:   ICD-10-CM   1. Paroxysmal A-fib (HCC)  I48.0 MYOCARDIAL AMYLOID IMAGING PLANAR AND SPECT    2. Presence of Watchman left atrial appendage closure device  Z95.818 aspirin  EC 81 MG tablet    3. Nonrheumatic aortic valve stenosis  I35.0     4. S/P TAVR (transcatheter aortic valve replacement)  Z95.2 aspirin  EC 81 MG tablet    5. Need for antibiotic prophylaxis for dental procedure  Z79.2     6. Complete heart block (HCC)  I44.2 EKG 12-Lead    7. Presence of permanent cardiac pacemaker  Z95.0 EKG 12-Lead    8. Chronic diastolic heart failure (HCC)  P49.67 MYOCARDIAL AMYLOID IMAGING PLANAR AND SPECT    Pro b natriuretic peptide (BNP)     Basic Metabolic Panel (BMET)    Basic Metabolic Panel (BMET)    Pro b natriuretic peptide (BNP)    AMB Referral to [facility] Pharm-D    losartan  (COZAAR ) 25 MG tablet    torsemide  (DEMADEX ) 10 MG tablet    9. Abnormal electrocardiogram (ECG) (EKG)  R94.31 MYOCARDIAL AMYLOID IMAGING PLANAR AND SPECT    10. Diabetes mellitus without complication (HCC)  E11.9     11. Essential hypertension  I10        Recommendation(s):  Paroxysmal A-fib (HCC) Presence of Watchman left atrial appendage closure device EKG illustrates sinus rhythm. Given her high CHA2DS2-VASc SCORE and oral anticoagulation being cost prohibitive patient underwent watchman implant. Will transition her from Plavix  to aspirin  81 mg p.o. daily  Nonrheumatic aortic valve stenosis S/P TAVR (transcatheter aortic valve replacement) Need for antibiotic prophylaxis for dental procedure December 02, 2022:  Edwards Sapien 3 Ultra Resilia THV (size 26 mm, model # 9755RSL, serial # [id number])  Fingerprint echo January 12 2023 Patient is aware that she needs antibiotic prophylaxis  Complete heart block (HCC) Presence of permanent cardiac pacemaker Patient implanted in August 2024 with Dr. Cindie, dual-chamber pacemaker. Currently enrolled in the pacemaker clinic. Last device check results reviewed.  Chronic diastolic heart failure (HCC) Abnormal electrocardiogram (ECG) (EKG) NYHA class II - Discontinue lisinopril  and hydrochlorothiazide . - Initiate torsemide  10 mg daily in the morning. - Start losartan  25 mg at night. - Order blood work in one week to monitor kidney function. - Refer to PharmD clinic for GDMT management. - Advise to avoid NSAIDs; recommend Tylenol  for pain management. - Instruct to monitor weight and aim for 192-193 pounds. Seen by advanced heart failure specialist Dr. Ezra Shuck November 2024.  Patient was recommended to have a cardiac MRI to evaluate for possible cardiac amyloidosis.  If MRI is not  suggestive of cardiac amyloidosis is recommended a repeat PYP study in 6 months to evaluate for progression. Cardiac MRI was not highly suggestive of cardiac amyloidosis as of January 2025 Molecular genetic report for familial amyloidosis hATTR via TTR gene reported negative (see media section 03/03/2023). Will order PYP scan again for re-evaluation.   Diabetes mellitus without complication (HCC) Currently on Farxiga , ARB, statin, Ozempic  Essential hypertension Office blood pressures are acceptable. Medication changes as discussed above  Orders Placed:  Orders Placed This Encounter  Procedures   Pro b natriuretic peptide (BNP)    Standing Status:   Future    Number of Occurrences:   1    Expiration Date:   03/24/2024   Basic Metabolic Panel (BMET)    Standing Status:   Future    Number of Occurrences:   1    Expiration Date:   03/24/2024    Remote health to draw?:   No   AMB Referral to [facility] Pharm-D    Referral Priority:   Routine    Referral Type:   Consultation    Referral Reason:   Specialty Services Required    Number of Visits Requested:   1   MYOCARDIAL AMYLOID IMAGING PLANAR AND SPECT    Standing Status:   Future    Expiration Date:   12/23/2024    Where [facility] be [facility]    Patient weight in lbs:   200   EKG 12-Lead    Final Medication List:    Meds ordered this encounter  Medications   DISCONTD: losartan  (COZAAR ) 25 MG tablet    Sig: Take 1 tablet (25 mg total) by mouth daily.    Dispense:  30 tablet    Refill:  6    Stopping Lisinopril -Hydrochlorothiazide    losartan  (COZAAR ) 25 MG tablet    Sig: Take 1 tablet (25 mg total) by mouth daily.    Dispense:  30 tablet    Refill:  6    Stopping Lisinopril -Hydrochlorothiazide    torsemide  (DEMADEX ) 10 MG tablet    Sig: Take 1 tablet (10 mg total) by mouth daily.    Dispense:  90 tablet    Refill:  1   aspirin  EC 81 MG tablet    Sig: Take 1 tablet (81 mg total) by mouth  daily. Swallow whole.    Medications Discontinued During This Encounter  Medication Reason   B Complex Vitamins (B COMPLEX PO) Patient Preference   lisinopril -hydrochlorothiazide  (ZESTORETIC ) 20-12.5 MG tablet Discontinued by provider   torsemide  (DEMADEX ) 10 MG tablet Change in therapy   losartan  (COZAAR ) 25 MG tablet    clopidogrel  (PLAVIX ) 75 MG tablet Change in therapy     Current Outpatient Medications:    albuterol  (VENTOLIN  HFA) 108 (90 Base) MCG/ACT inhaler, Inhale 2 puffs into the lungs every 4 (four) hours as needed for wheezing or shortness of breath., Disp: , Rfl:    aspirin  EC 81 MG tablet, Take 1 tablet (81 mg total) by mouth daily. Swallow whole., Disp: , Rfl:    azithromycin  (ZITHROMAX ) 500 MG tablet, Take 1 tablet (500 mg total) by mouth once as needed for up to 1 dose (Take lifelong due to TAVR valve)., Disp: 6 tablet, Rfl: 6   Cholecalciferol (VITAMIN D3) 125 MCG (5000 UT) TABS, Take 5,000 Units by mouth daily., Disp: , Rfl:    Magnesium  400 MG CAPS, Take 400 mg by mouth daily., Disp: , Rfl:  Multiple Vitamin (MULTIVITAMINS PO), Take 1 tablet by mouth daily., Disp: , Rfl:    naproxen sodium (ALEVE) 220 MG tablet, Take 220 mg by mouth daily., Disp: , Rfl:    OZEMPIC, 0.25 OR 0.5 MG/DOSE, 2 MG/3ML SOPN, Inject 0.5 mg into the skin every Saturday., Disp: , Rfl:    pantoprazole  (PROTONIX ) 40 MG tablet, Take 40 mg by mouth daily., Disp: , Rfl:    polyvinyl alcohol (LIQUIFILM TEARS) 1.4 % ophthalmic solution, Place 1 drop into both eyes daily., Disp: , Rfl:    potassium chloride  (KLOR-CON ) 10 MEQ tablet, 2 tablet Orally Twice a day, Disp: , Rfl:    rosuvastatin  (CRESTOR ) 5 MG tablet, Take 5 mg by mouth daily., Disp: , Rfl:    Specialty Vitamins Products (ADVANCED COLLAGEN PO), Take 1 capsule by mouth daily., Disp: , Rfl:    torsemide  (DEMADEX ) 10 MG tablet, Take 1 tablet (10 mg total) by mouth daily., Disp: 90 tablet, Rfl: 1   vitamin B-12 (CYANOCOBALAMIN) 100 MCG tablet,  Take 100 mcg by mouth daily., Disp: , Rfl:    FARXIGA  10 MG TABS tablet, Take 1 tablet (10 mg total) by mouth daily. PLEASE SCHEDULE APPOINTMENT FOR MORE REFILLS CALL [phone] OPTION 2, Disp: 90 tablet, Rfl: 3   losartan  (COZAAR ) 25 MG tablet, Take 1 tablet (25 mg total) by mouth daily., Disp: 30 tablet, Rfl: 6  Consent:   NA  Disposition:   6 months follow-up sooner if needed  Her questions and concerns were addressed to her satisfaction. She voices understanding of the recommendations provided during this encounter.    Signed, Madonna Michele HAS, [facility]  A Division of [facility] [address], [place], [place] [address]

## 2023-12-29 ENCOUNTER — Other Ambulatory Visit: Payer: Self-pay | Admitting: Cardiology

## 2023-12-29 ENCOUNTER — Other Ambulatory Visit (HOSPITAL_COMMUNITY): Payer: Self-pay | Admitting: Cardiology

## 2023-12-29 DIAGNOSIS — I350 Nonrheumatic aortic (valve) stenosis: Secondary | ICD-10-CM | POA: Diagnosis not present

## 2023-12-29 DIAGNOSIS — E1159 Type 2 diabetes mellitus with other circulatory complications: Secondary | ICD-10-CM | POA: Diagnosis not present

## 2023-12-29 DIAGNOSIS — I10 Essential (primary) hypertension: Secondary | ICD-10-CM | POA: Diagnosis not present

## 2023-12-29 DIAGNOSIS — R9431 Abnormal electrocardiogram [ECG] [EKG]: Secondary | ICD-10-CM

## 2023-12-29 DIAGNOSIS — I4891 Unspecified atrial fibrillation: Secondary | ICD-10-CM | POA: Diagnosis not present

## 2023-12-30 NOTE — Progress Notes (Signed)
 Remote PPM Transmission

## 2023-12-31 ENCOUNTER — Telehealth (HOSPITAL_COMMUNITY): Payer: Self-pay | Admitting: *Deleted

## 2023-12-31 DIAGNOSIS — I480 Paroxysmal atrial fibrillation: Secondary | ICD-10-CM | POA: Diagnosis not present

## 2023-12-31 DIAGNOSIS — Z79899 Other long term (current) drug therapy: Secondary | ICD-10-CM | POA: Diagnosis not present

## 2023-12-31 DIAGNOSIS — R9431 Abnormal electrocardiogram [ECG] [EKG]: Secondary | ICD-10-CM | POA: Diagnosis not present

## 2023-12-31 NOTE — Telephone Encounter (Signed)
 Pt given instructions for Amyloid study.

## 2024-01-01 LAB — BASIC METABOLIC PANEL WITH GFR
BUN/Creatinine Ratio: 24 (ref 12–28)
BUN: 21 mg/dL (ref 8–27)
CO2: 23 mmol/L (ref 20–29)
Calcium: 9.6 mg/dL (ref 8.7–10.3)
Chloride: 101 mmol/L (ref 96–106)
Creatinine, Ser: 0.86 mg/dL (ref 0.57–1.00)
Glucose: 105 mg/dL — ABNORMAL HIGH (ref 70–99)
Potassium: 4.2 mmol/L (ref 3.5–5.2)
Sodium: 141 mmol/L (ref 134–144)
eGFR: 69 mL/min/1.73 (ref >59)

## 2024-01-02 ENCOUNTER — Encounter: Payer: Self-pay | Admitting: Cardiology

## 2024-01-02 MED ORDER — ASPIRIN 81 MG PO TBEC: 81.0000 mg | DELAYED_RELEASE_TABLET | Freq: Every day | ORAL | Status: AC

## 2024-01-04 ENCOUNTER — Telehealth: Payer: Self-pay

## 2024-01-04 NOTE — Telephone Encounter (Signed)
 Spoke with the patient and she is aware to discontinue Plavix  and start ASA 81 mg daily.

## 2024-01-04 NOTE — Telephone Encounter (Signed)
-----   Message from [facility] sent at 01/02/2024  3:41 PM EDT ----- Regarding: Medication change Please call the patient and have her stop Plavix  and start ASA 81mg  po qday.   Sunit [place], DO, FACC

## 2024-01-05 ENCOUNTER — Ambulatory Visit: Payer: Self-pay | Admitting: Cardiology

## 2024-01-07 ENCOUNTER — Ambulatory Visit (HOSPITAL_COMMUNITY)
Admission: RE | Admit: 2024-01-07 | Discharge: 2024-01-07 | Disposition: A | Discharge status: Home / Self Care | Source: Ambulatory Visit | Attending: Internal Medicine | Admitting: Internal Medicine

## 2024-01-07 DIAGNOSIS — I5032 Chronic diastolic (congestive) heart failure: Secondary | ICD-10-CM

## 2024-01-07 DIAGNOSIS — R9431 Abnormal electrocardiogram [ECG] [EKG]: Secondary | ICD-10-CM | POA: Diagnosis not present

## 2024-01-07 DIAGNOSIS — I700 Atherosclerosis of aorta: Secondary | ICD-10-CM | POA: Diagnosis not present

## 2024-01-07 DIAGNOSIS — I480 Paroxysmal atrial fibrillation: Secondary | ICD-10-CM | POA: Diagnosis not present

## 2024-01-07 LAB — MYOCARDIAL AMYLOID PLANAR & SPECT: H/CL Ratio: 1.05

## 2024-01-07 MED ORDER — TECHNETIUM TC 99M PYROPHOSPHATE
21.9000 | Freq: Once | INTRAVENOUS | Status: AC
  Administered 2024-01-07: 21.9 via INTRAVENOUS

## 2024-01-11 ENCOUNTER — Telehealth (HOSPITAL_COMMUNITY): Payer: Self-pay

## 2024-01-11 ENCOUNTER — Other Ambulatory Visit (HOSPITAL_COMMUNITY): Payer: Self-pay

## 2024-01-11 NOTE — Telephone Encounter (Signed)
 Advanced Heart Failure Patient Advocate Encounter  Review of patient chart for upcoming [employer] grant expiration. Test billing for Farxiga  and Eliquis  return a copay of $0 for 90 days, grant renewal not needed at this time. Will be available if the patient needs medication assistance in the start of the 2026 coverage year.  Rachel DEL, CPhT Rx Patient Advocate Phone: [phone]

## 2024-01-12 DIAGNOSIS — I10 Essential (primary) hypertension: Secondary | ICD-10-CM | POA: Diagnosis not present

## 2024-01-12 DIAGNOSIS — I350 Nonrheumatic aortic (valve) stenosis: Secondary | ICD-10-CM | POA: Diagnosis not present

## 2024-01-12 DIAGNOSIS — E1159 Type 2 diabetes mellitus with other circulatory complications: Secondary | ICD-10-CM | POA: Diagnosis not present

## 2024-01-12 DIAGNOSIS — I4891 Unspecified atrial fibrillation: Secondary | ICD-10-CM | POA: Diagnosis not present

## 2024-01-12 DIAGNOSIS — E782 Mixed hyperlipidemia: Secondary | ICD-10-CM | POA: Diagnosis not present

## 2024-01-19 ENCOUNTER — Ambulatory Visit: Payer: Self-pay | Admitting: Cardiology

## 2024-01-22 ENCOUNTER — Ambulatory Visit: Admitting: Pharmacist

## 2024-01-22 ENCOUNTER — Other Ambulatory Visit (HOSPITAL_COMMUNITY): Payer: Self-pay

## 2024-01-22 VITALS — BP 136/80 | HR 78

## 2024-01-22 DIAGNOSIS — I5033 Acute on chronic diastolic (congestive) heart failure: Secondary | ICD-10-CM

## 2024-01-22 MED ORDER — LOSARTAN POTASSIUM 50 MG PO TABS: 50.0000 mg | ORAL_TABLET | Freq: Every day | ORAL | 3 refills | Status: DC

## 2024-01-22 NOTE — Assessment & Plan Note (Signed)
 Assessment: Patient with lower extremity edema present on exam. Patient instructed to wear her compression stockings Was advised that she can take an extra 1/2 tablet of torsemide  if she gains 3 more pounds in a day or 5 or more pounds in a week No shortness of breath Blood pressure elevated on initial check.  Improved but still elevated on recheck Already on SGLT2  Plan: Increase losartan  to 50 mg daily for better blood pressure control take an extra 1/2 tablet of torsemide  if she gains 3 more pounds in a day or 5 or more pounds in a week Wear compression stockings Follow-up in 1 month-Will need BMP at that time

## 2024-01-22 NOTE — Patient Instructions (Addendum)
 Take an extra 1/2 tablet of torsemide  if you gain 3 or more pounds in a day or 5 lb in a week  Wear compression stockings  Please increase losartan  to 50mg  daily  Please bring both your blood pressure machines to next visit Bring a list of your blood pressure readings  Your blood pressure goal is < 130/22mmHg  Important lifestyle changes to control high blood pressure  Intervention  Effect on the BP   Weight loss Weight loss is one of the most effective lifestyle changes for controlling blood pressure. If you're overweight or obese, losing even a small amount of weight can help reduce blood pressure.    Blood pressure can decrease by 1 millimeter of mercury (mmHg) with each kilogram (about 2.2 pounds) of weight lost.   Exercise regularly As a general goal, aim for 30 minutes of moderate physical activity every day.    Regular physical activity can lower blood pressure by 5 - 8 mmHg.   Eat a healthy diet Eat a diet rich in whole grains, fruits, vegetables, lean meat, and low-fat dairy products. Limit processed foods, saturated fat, and sweets.    A heart-healthy diet can lower high blood pressure by 10 mmHg.   Reduce salt (sodium) in your diet Aim for 000mg  of sodium each day. Avoid deli meats, canned food, and frozen microwave meals which are high in sodium.     Limiting sodium can reduce blood pressure by 5 mmHg.   Limit alcohol One drink equals 12 ounces of beer, 5 ounces of wine, or 1.5 ounces of 80-proof liquor.    Limiting alcohol to < 1 drink a day for women or < 2 drinks a day for men can help lower blood pressure by about 4 mmHg.   To check your pressure at home you will need to:   Sit up in a chair, with feet flat on the floor and back supported. Do not cross your ankles or legs. Rest your left arm so that the cuff is about heart level. If the cuff goes on your upper arm, then just relax your arm on the table, arm of the chair, or your lap. If you have a  wrist cuff, hold your wrist against your chest at heart level. Place the cuff snugly around your arm, about 1 inch above the crease of your elbow. The cords should be inside the groove of your elbow.  Sit quietly, with the cuff in place, for about 5 minutes. Then press the power button to start a reading. Do not talk or move while the reading is taking place.  Record your readings on a sheet of paper. Although most cuffs have a memory, it is often easier to see a pattern developing when the numbers are all in front of you.  You can repeat the reading after 1-3 minutes if it is recommended.   Make sure your bladder is empty and you have not had caffeine or tobacco within the last 30 minutes   Always bring your blood pressure log with you to your appointments. If you have not brought your monitor in to be double checked for accuracy, please bring it to your next appointment.   You can find a list of validated (accurate) blood pressure cuffs at: validatebp.org

## 2024-01-22 NOTE — Telephone Encounter (Signed)
 Her copay may be coming up as $0 if she is in the payment plan. I wonder if she is getting reimbursed. Can you call her and ask?

## 2024-01-22 NOTE — Progress Notes (Signed)
 Patient ID: [name]                 DOB: 06-Jul-1945                      MRN: [id number]     HPI: [name] is a 78 y.o. female referred by Dr. Michele to pharmacy clinic for HF medication management. PMH is significant for Aortic stenosis s/p 26 mm Edwards SAPIEN 3 via TF approach, status post pacemaker implant due to complete heart block (August 2024) with Dr. Cindie), device detected paroxysmal atrial fibrillation s/p LAAO (06/2023), diabetes mellitus type 2, HTN, former smoker, hx breast cancer x2 (status postmastectomy, radiation, chemotherapy), history of ventricular tachycardia, HFpEF. Most recent LVEF 60-65% on 11/30/23.  Patient presents today to Pharm.D. clinic.  She reports that her blood pressure at home using the blood pressure cuff [facility] has given her and tracks virtually has been 130-135/70-75.  Her personal blood pressure cuff gives her readings of 116-125/70.  At last visit with Dr. Michele her lisinopril /hydrochlorothiazide  was stopped and patient was started on losartan  25 mg and torsemide  was changed to 10 mg daily instead of every other day.  She reports swelling worse in her left leg than in her right, possibly due to a Achilles tendon issue.  She does have compression stockings but does not wear them.  Has not taken her torsemide  yet today since she had to travel to the appointment. Denies any shortness of breath.  Occasional lightheadedness that lasts 1 or 2 minutes and then goes away.  Heart rate typically 75-80.  She does try to limit sodium and also caffeine (about 2 caffeinated beverages per day) she reports that her weight at home has been ranging between 191 to 197 pounds.  No shortness of breath.  States that sometimes her weight does increase more than 3 pounds in a day but has never been instructed on what to do if that happens. Lower extremity edema is present.  Left leg tight and painful.   Current CHF meds: losartan  25mg  daily, Farxiga  10mg  daily,  torsemide  10mg  daily Previously tried:  Adherence Assessment  Do you ever forget to take your medication? [] Yes [] No  Do you ever skip doses due to side effects? [] Yes [] No  Do you have trouble affording your medicines? [] Yes [] No  Are you ever unable to pick up your medication due to transportation difficulties? [] Yes [] No  Do you ever stop taking your medications because you don't believe they are helping? [] Yes [] No  Do you check your weight daily? [] Yes [] No    BP goal: <130/80  Family History:  Family History  Problem Relation Age of Onset   Dementia Mother    Prostate cancer Father    Arrhythmia Sister    Hypertension Sister    Breast cancer Paternal Aunt    Hypertension Maternal Grandmother    Heart Problems Maternal Grandfather    Diabetes Paternal Grandmother    Heart Problems Paternal Grandmother    Blindness Paternal Grandfather    Neuropathy Neg Hx     Social History: Not discussed today  Diet: cheese and crackers Doesn't add salt most of the time Cooks at home Coffee in AM 1-2 cups Coke zero 1/2 bottle daily Homemade lemonade with stevia  Exercise: walks 20 min daily, carioglider MWF  Home BP readings: 130-135/70-75 [facility] virtual monitor 116-125/70 personal BP machine HR 70's  Wt Readings from Last 3 Encounters:  12/24/23 200  lb (90.7 kg)  11/30/23 195 lb 3.2 oz (88.5 kg)  08/05/23 202 lb 3.2 oz (91.7 kg)   BP Readings from Last 3 Encounters:  01/22/24 136/80  12/24/23 134/78  11/30/23 116/60   Pulse Readings from Last 3 Encounters:  01/22/24 78  12/24/23 74  11/30/23 74    Renal function: CrCl cannot be calculated (Patient's most recent lab result is older than the maximum 21 days allowed.).  Past Medical History:  Diagnosis Date   Allergic rhinitis    Asthma    Childhood per patient   Breast cancer, left (HCC) 2007   ductal carcinoma in situ, underwent left mastectomy 07/2005; reconstructive surgery with expander, permanent  implant placed 03/2006, hospitalized January 2008 with breast cellulitis.   Breast cancer, right (HCC) 1998   1998; modified radical right mastectomy 1998 for infiltrating ductal adenocarcinoma with 7 of 12 axillary nodes positive. Underwent adjuvant chemotherapy and bone marrow transplant by Dr Seward as well as radiation therapy. Also had Tram flap reconstruction.   Diabetes mellitus without complication (HCC)    GERD (gastroesophageal reflux disease)    Hiatal hernia    History of ventricular tachycardia    Hypertension    Vitamin D deficiency     Current Outpatient Medications on File Prior to Visit  Medication Sig Dispense Refill   albuterol  (VENTOLIN  HFA) 108 (90 Base) MCG/ACT inhaler Inhale 2 puffs into the lungs every 4 (four) hours as needed for wheezing or shortness of breath.     aspirin  EC 81 MG tablet Take 1 tablet (81 mg total) by mouth daily. Swallow whole.     Cholecalciferol (VITAMIN D3) 125 MCG (5000 UT) TABS Take 5,000 Units by mouth daily.     Cyanocobalamin (VITAMIN B 12 PO) Take 1,000 mcg by mouth daily at 12 noon.     FARXIGA  10 MG TABS tablet Take 1 tablet (10 mg total) by mouth daily. PLEASE SCHEDULE APPOINTMENT FOR MORE REFILLS CALL [phone] OPTION 2 90 tablet 3   Magnesium  400 MG CAPS Take 400 mg by mouth daily. (Patient taking differently: Take 400 mg by mouth 2 (two) times daily.)     Misc Natural Products (ELDERBERRY IMMUNE COMPLEX) CHEW Chew 1 each by mouth daily.     Multiple Vitamin (MULTIVITAMINS PO) Take 1 tablet by mouth daily.     OZEMPIC, 0.25 OR 0.5 MG/DOSE, 2 MG/3ML SOPN Inject 0.5 mg into the skin every Saturday.     pantoprazole  (PROTONIX ) 40 MG tablet Take 40 mg by mouth daily.     polyvinyl alcohol (LIQUIFILM TEARS) 1.4 % ophthalmic solution Place 1 drop into both eyes daily.     rosuvastatin  (CRESTOR ) 5 MG tablet Take 5 mg by mouth daily.     torsemide  (DEMADEX ) 10 MG tablet Take 1 tablet (10 mg total) by mouth daily. 90 tablet 1   azithromycin   (ZITHROMAX ) 500 MG tablet Take 1 tablet (500 mg total) by mouth once as needed for up to 1 dose (Take lifelong due to TAVR valve). 6 tablet 6   No current facility-administered medications on file prior to visit.    Allergies  Allergen Reactions   Penicillins Shortness Of Breath and Rash   Bee Venom Swelling    Bee stings   Morphine Other (See Comments) and Nausea And Vomiting     Assessment/Plan:  1. CHF -  Acute on chronic heart failure with preserved ejection fraction (HFpEF) (HCC) Assessment: Patient with lower extremity edema present on exam. Patient instructed to wear  her compression stockings Was advised that she can take an extra 1/2 tablet of torsemide  if she gains 3 more pounds in a day or 5 or more pounds in a week No shortness of breath Blood pressure elevated on initial check.  Improved but still elevated on recheck Already on SGLT2  Plan: Increase losartan  to 50 mg daily for better blood pressure control take an extra 1/2 tablet of torsemide  if she gains 3 more pounds in a day or 5 or more pounds in a week Wear compression stockings Follow-up in 1 month-Will need BMP at that time   Thank you   Audry Pecina D Stori Royse, Pharm.[name], CPP [facility] Division of [facility] [address], [place], [place] [address]  Phone: [phone]; Fax: [phone]

## 2024-01-22 NOTE — Telephone Encounter (Signed)
 [name] is going to expire 01/31/24. Can we please renew?

## 2024-01-28 DIAGNOSIS — I350 Nonrheumatic aortic (valve) stenosis: Secondary | ICD-10-CM | POA: Diagnosis not present

## 2024-01-28 DIAGNOSIS — E1159 Type 2 diabetes mellitus with other circulatory complications: Secondary | ICD-10-CM | POA: Diagnosis not present

## 2024-01-28 DIAGNOSIS — I4891 Unspecified atrial fibrillation: Secondary | ICD-10-CM | POA: Diagnosis not present

## 2024-01-28 DIAGNOSIS — I10 Essential (primary) hypertension: Secondary | ICD-10-CM | POA: Diagnosis not present

## 2024-02-11 DIAGNOSIS — Z6834 Body mass index (BMI) 34.0-34.9, adult: Secondary | ICD-10-CM | POA: Diagnosis not present

## 2024-02-11 DIAGNOSIS — M65341 Trigger finger, right ring finger: Secondary | ICD-10-CM | POA: Diagnosis not present

## 2024-02-11 DIAGNOSIS — E1159 Type 2 diabetes mellitus with other circulatory complications: Secondary | ICD-10-CM | POA: Diagnosis not present

## 2024-02-11 DIAGNOSIS — E559 Vitamin D deficiency, unspecified: Secondary | ICD-10-CM | POA: Diagnosis not present

## 2024-02-11 DIAGNOSIS — I5032 Chronic diastolic (congestive) heart failure: Secondary | ICD-10-CM | POA: Diagnosis not present

## 2024-02-11 DIAGNOSIS — E782 Mixed hyperlipidemia: Secondary | ICD-10-CM | POA: Diagnosis not present

## 2024-02-11 DIAGNOSIS — R202 Paresthesia of skin: Secondary | ICD-10-CM | POA: Diagnosis not present

## 2024-02-11 DIAGNOSIS — I10 Essential (primary) hypertension: Secondary | ICD-10-CM | POA: Diagnosis not present

## 2024-02-11 DIAGNOSIS — Z1331 Encounter for screening for depression: Secondary | ICD-10-CM | POA: Diagnosis not present

## 2024-02-11 DIAGNOSIS — Z0000 Encounter for general adult medical examination without abnormal findings: Secondary | ICD-10-CM | POA: Diagnosis not present

## 2024-02-11 DIAGNOSIS — Z23 Encounter for immunization: Secondary | ICD-10-CM | POA: Diagnosis not present

## 2024-02-12 DIAGNOSIS — E1159 Type 2 diabetes mellitus with other circulatory complications: Secondary | ICD-10-CM | POA: Diagnosis not present

## 2024-02-12 DIAGNOSIS — E782 Mixed hyperlipidemia: Secondary | ICD-10-CM | POA: Diagnosis not present

## 2024-02-12 DIAGNOSIS — I350 Nonrheumatic aortic (valve) stenosis: Secondary | ICD-10-CM | POA: Diagnosis not present

## 2024-02-12 DIAGNOSIS — I4891 Unspecified atrial fibrillation: Secondary | ICD-10-CM | POA: Diagnosis not present

## 2024-02-12 DIAGNOSIS — I10 Essential (primary) hypertension: Secondary | ICD-10-CM | POA: Diagnosis not present

## 2024-02-23 DIAGNOSIS — R200 Anesthesia of skin: Secondary | ICD-10-CM | POA: Diagnosis not present

## 2024-02-25 NOTE — Progress Notes (Unsigned)
 Patient ID: MARRANDA ARAKELIAN                 DOB: 08-09-1945                      MRN: [id number]     HPI: [name] is a 78 y.o. female referred by Dr. Michele to pharmacy clinic for HF medication management. PMH is significant for Aortic stenosis s/p 26 mm Edwards SAPIEN 3 via TF approach, status post pacemaker implant due to complete heart block (August 2024) with Dr. Cindie), device detected paroxysmal atrial fibrillation s/p LAAO (06/2023), diabetes mellitus type 2, HTN, former smoker, hx breast cancer x2 (status postmastectomy, radiation, chemotherapy), history of ventricular tachycardia, HFpEF. Most recent LVEF 60-65% on 11/30/23.  At last Pharm D visit 10/10 She reported  that her blood pressure at home has been 130-135/70-75.  Her personal blood pressure cuff gives her readings of 116-125/70. Denied any shortness of breath.  Occasional lightheadedness that lasts 1 or 2 minutes and then goes away.  Heart rate typically 75-80.  She does try to limit sodium and also caffeine (about 2 caffeinated beverages per day) she reported that her weight at home has been ranging between 191 to 197 pounds. No shortness of breath. Stated that sometimes her weight does increase more than 3 pounds in a day but has never been instructed on what to do if that happens. Lower extremity was edema is present.  Left leg tight and painful. Losartan  was increased to 5 mg daily for better blood pressure control. The patient was instructed to take an extra 1/2 tablet of torsemide  if she gains 3 more pounds in a day or 5 more pounds in a week. She was told to wear compression stockings and follow up in 1 month.   Patient presents today to PharmD. clinic.   Current CHF meds: losartan  50mg  daily, Farxiga  10mg  daily, torsemide  10mg  daily Previously tried:  Adherence Assessment  Do you ever forget to take your medication? [] Yes [] No  Do you ever skip doses due to side effects? [] Yes [] No  Do you have trouble affording your  medicines? [] Yes [] No  Are you ever unable to pick up your medication due to transportation difficulties? [] Yes [] No  Do you ever stop taking your medications because you don't believe they are helping? [] Yes [] No  Do you check your weight daily? [] Yes [] No    BP goal: <130/80  Family History:  Family History  Problem Relation Age of Onset   Dementia Mother    Prostate cancer Father    Arrhythmia Sister    Hypertension Sister    Breast cancer Paternal Aunt    Hypertension Maternal Grandmother    Heart Problems Maternal Grandfather    Diabetes Paternal Grandmother    Heart Problems Paternal Grandmother    Blindness Paternal Grandfather    Neuropathy Neg Hx     Social History: Not discussed today  Diet: cheese and crackers Doesn't add salt most of the time Cooks at home Coffee in AM 1-2 cups Coke zero 1/2 bottle daily Homemade lemonade with stevia  Exercise: walks 20 min daily, carioglider MWF  Home BP readings: 130-135/70-75 [facility] virtual monitor 116-125/70 personal BP machine HR 70's  Wt Readings from Last 3 Encounters:  12/24/23 200 lb (90.7 kg)  11/30/23 195 lb 3.2 oz (88.5 kg)  08/05/23 202 lb 3.2 oz (91.7 kg)   BP Readings from Last 3 Encounters:  01/22/24 136/80  12/24/23  134/78  11/30/23 116/60   Pulse Readings from Last 3 Encounters:  01/22/24 78  12/24/23 74  11/30/23 74    Renal function: CrCl cannot be calculated (Patient's most recent lab result is older than the maximum 21 days allowed.).  Past Medical History:  Diagnosis Date   Allergic rhinitis    Asthma    Childhood per patient   Breast cancer, left (HCC) 2007   ductal carcinoma in situ, underwent left mastectomy 07/2005; reconstructive surgery with expander, permanent implant placed 03/2006, hospitalized January 2008 with breast cellulitis.   Breast cancer, right (HCC) 1998   1998; modified radical right mastectomy 1998 for infiltrating ductal adenocarcinoma with 7 of 12 axillary  nodes positive. Underwent adjuvant chemotherapy and bone marrow transplant by Dr Seward as well as radiation therapy. Also had Tram flap reconstruction.   Diabetes mellitus without complication (HCC)    GERD (gastroesophageal reflux disease)    Hiatal hernia    History of ventricular tachycardia    Hypertension    Vitamin D deficiency     Current Outpatient Medications on File Prior to Visit  Medication Sig Dispense Refill   albuterol  (VENTOLIN  HFA) 108 (90 Base) MCG/ACT inhaler Inhale 2 puffs into the lungs every 4 (four) hours as needed for wheezing or shortness of breath.     aspirin  EC 81 MG tablet Take 1 tablet (81 mg total) by mouth daily. Swallow whole.     azithromycin  (ZITHROMAX ) 500 MG tablet Take 1 tablet (500 mg total) by mouth once as needed for up to 1 dose (Take lifelong due to TAVR valve). 6 tablet 6   Cholecalciferol (VITAMIN D3) 125 MCG (5000 UT) TABS Take 5,000 Units by mouth daily.     Cyanocobalamin (VITAMIN B 12 PO) Take 1,000 mcg by mouth daily at 12 noon.     FARXIGA  10 MG TABS tablet Take 1 tablet (10 mg total) by mouth daily. PLEASE SCHEDULE APPOINTMENT FOR MORE REFILLS CALL [phone] OPTION 2 90 tablet 3   losartan  (COZAAR ) 50 MG tablet Take 1 tablet (50 mg total) by mouth daily. 90 tablet 3   Magnesium  400 MG CAPS Take 400 mg by mouth daily. (Patient taking differently: Take 400 mg by mouth 2 (two) times daily.)     Misc Natural Products (ELDERBERRY IMMUNE COMPLEX) CHEW Chew 1 each by mouth daily.     Multiple Vitamin (MULTIVITAMINS PO) Take 1 tablet by mouth daily.     OZEMPIC, 0.25 OR 0.5 MG/DOSE, 2 MG/3ML SOPN Inject 0.5 mg into the skin every Saturday.     pantoprazole  (PROTONIX ) 40 MG tablet Take 40 mg by mouth daily.     polyvinyl alcohol (LIQUIFILM TEARS) 1.4 % ophthalmic solution Place 1 drop into both eyes daily.     rosuvastatin  (CRESTOR ) 5 MG tablet Take 5 mg by mouth daily.     torsemide  (DEMADEX ) 10 MG tablet Take 1 tablet (10 mg total) by mouth daily.  90 tablet 1   No current facility-administered medications on file prior to visit.    Allergies  Allergen Reactions   Penicillins Shortness Of Breath and Rash   Bee Venom Swelling    Bee stings   Morphine Other (See Comments) and Nausea And Vomiting     Assessment/Plan:  1. CHF -  No problem-specific Assessment & Plan notes found for this encounter.    Thank you  Lum Ricks, PharmD Candidate  [facility] [employer]    Eleanor JONETTA Crews, Pharm.D, BCACP,  CPP [facility] of [facility] [address], [place], [place] [address]  Phone: [phone]; Fax: [phone]

## 2024-02-26 ENCOUNTER — Ambulatory Visit: Attending: Cardiovascular Disease | Admitting: Pharmacist

## 2024-02-26 VITALS — BP 140/80 | HR 103

## 2024-02-26 DIAGNOSIS — I10 Essential (primary) hypertension: Secondary | ICD-10-CM

## 2024-02-26 MED ORDER — IRBESARTAN 300 MG PO TABS: 300.0000 mg | ORAL_TABLET | Freq: Every day | ORAL | 3 refills | Status: AC

## 2024-02-26 NOTE — Assessment & Plan Note (Addendum)
 Assessment:  Blood pressure is still uncontrolled after increasing losartan  to 50 mg.  Patient was started on meloxicam 15 mg daily and has had more lower extremity edema since starting this medication.   Plan:  -STOP losartan  50 mg daily  -START Irbesartan 300 mg daily  -Continue Farxiga  10 mg daily, torsemide  10 mg daily -Take an extra 1/2 of torsemide  when you gain 3 pounds in one day or 5 pounds in 1 week.  -Only take meloxicam 15 mg when necessary -Come labs when she has cardiology visit in December  Follow-up: 04/20/2024

## 2024-02-26 NOTE — Patient Instructions (Signed)
 Please STOP taking losartan  and START taking irbesartan 300mg  daily  Please continue Farxiga  and torsemide  Please bring your blood pressure machine to next visit Please come for lab work about 2 weeks after starting new medication

## 2024-02-27 DIAGNOSIS — E1159 Type 2 diabetes mellitus with other circulatory complications: Secondary | ICD-10-CM | POA: Diagnosis not present

## 2024-02-27 DIAGNOSIS — I10 Essential (primary) hypertension: Secondary | ICD-10-CM | POA: Diagnosis not present

## 2024-02-27 DIAGNOSIS — I350 Nonrheumatic aortic (valve) stenosis: Secondary | ICD-10-CM | POA: Diagnosis not present

## 2024-02-27 DIAGNOSIS — I4891 Unspecified atrial fibrillation: Secondary | ICD-10-CM | POA: Diagnosis not present

## 2024-02-29 DIAGNOSIS — J45909 Unspecified asthma, uncomplicated: Secondary | ICD-10-CM | POA: Diagnosis not present

## 2024-02-29 DIAGNOSIS — E119 Type 2 diabetes mellitus without complications: Secondary | ICD-10-CM | POA: Diagnosis not present

## 2024-02-29 DIAGNOSIS — Z823 Family history of stroke: Secondary | ICD-10-CM | POA: Diagnosis not present

## 2024-02-29 DIAGNOSIS — Z7984 Long term (current) use of oral hypoglycemic drugs: Secondary | ICD-10-CM | POA: Diagnosis not present

## 2024-02-29 DIAGNOSIS — I358 Other nonrheumatic aortic valve disorders: Secondary | ICD-10-CM | POA: Diagnosis not present

## 2024-02-29 DIAGNOSIS — K219 Gastro-esophageal reflux disease without esophagitis: Secondary | ICD-10-CM | POA: Diagnosis not present

## 2024-02-29 DIAGNOSIS — Z95810 Presence of automatic (implantable) cardiac defibrillator: Secondary | ICD-10-CM | POA: Diagnosis not present

## 2024-02-29 DIAGNOSIS — Z9181 History of falling: Secondary | ICD-10-CM | POA: Diagnosis not present

## 2024-02-29 DIAGNOSIS — I099 Rheumatic heart disease, unspecified: Secondary | ICD-10-CM | POA: Diagnosis not present

## 2024-02-29 DIAGNOSIS — I442 Atrioventricular block, complete: Secondary | ICD-10-CM | POA: Diagnosis not present

## 2024-02-29 DIAGNOSIS — Z87891 Personal history of nicotine dependence: Secondary | ICD-10-CM | POA: Diagnosis not present

## 2024-02-29 DIAGNOSIS — I872 Venous insufficiency (chronic) (peripheral): Secondary | ICD-10-CM | POA: Diagnosis not present

## 2024-02-29 DIAGNOSIS — I700 Atherosclerosis of aorta: Secondary | ICD-10-CM | POA: Diagnosis not present

## 2024-02-29 DIAGNOSIS — I472 Ventricular tachycardia, unspecified: Secondary | ICD-10-CM | POA: Diagnosis not present

## 2024-02-29 DIAGNOSIS — Z9481 Bone marrow transplant status: Secondary | ICD-10-CM | POA: Diagnosis not present

## 2024-02-29 DIAGNOSIS — I4891 Unspecified atrial fibrillation: Secondary | ICD-10-CM | POA: Diagnosis not present

## 2024-02-29 DIAGNOSIS — I10 Essential (primary) hypertension: Secondary | ICD-10-CM | POA: Diagnosis not present

## 2024-02-29 DIAGNOSIS — Z7982 Long term (current) use of aspirin: Secondary | ICD-10-CM | POA: Diagnosis not present

## 2024-02-29 DIAGNOSIS — E785 Hyperlipidemia, unspecified: Secondary | ICD-10-CM | POA: Diagnosis not present

## 2024-02-29 DIAGNOSIS — M1990 Unspecified osteoarthritis, unspecified site: Secondary | ICD-10-CM | POA: Diagnosis not present

## 2024-02-29 DIAGNOSIS — I2510 Atherosclerotic heart disease of native coronary artery without angina pectoris: Secondary | ICD-10-CM | POA: Diagnosis not present

## 2024-02-29 DIAGNOSIS — Z809 Family history of malignant neoplasm, unspecified: Secondary | ICD-10-CM | POA: Diagnosis not present

## 2024-02-29 DIAGNOSIS — I509 Heart failure, unspecified: Secondary | ICD-10-CM | POA: Diagnosis not present

## 2024-02-29 DIAGNOSIS — Z91038 Other insect allergy status: Secondary | ICD-10-CM | POA: Diagnosis not present

## 2024-02-29 DIAGNOSIS — Z6834 Body mass index (BMI) 34.0-34.9, adult: Secondary | ICD-10-CM | POA: Diagnosis not present

## 2024-02-29 DIAGNOSIS — Z853 Personal history of malignant neoplasm of breast: Secondary | ICD-10-CM | POA: Diagnosis not present

## 2024-02-29 DIAGNOSIS — Z833 Family history of diabetes mellitus: Secondary | ICD-10-CM | POA: Diagnosis not present

## 2024-03-04 ENCOUNTER — Ambulatory Visit: Payer: Medicare HMO

## 2024-03-04 DIAGNOSIS — G5601 Carpal tunnel syndrome, right upper limb: Secondary | ICD-10-CM | POA: Diagnosis not present

## 2024-03-04 DIAGNOSIS — I480 Paroxysmal atrial fibrillation: Secondary | ICD-10-CM | POA: Diagnosis not present

## 2024-03-04 DIAGNOSIS — G5602 Carpal tunnel syndrome, left upper limb: Secondary | ICD-10-CM | POA: Diagnosis not present

## 2024-03-04 DIAGNOSIS — G5603 Carpal tunnel syndrome, bilateral upper limbs: Secondary | ICD-10-CM | POA: Diagnosis not present

## 2024-03-08 ENCOUNTER — Ambulatory Visit: Admitting: Cardiology

## 2024-03-08 LAB — CUP PACEART REMOTE DEVICE CHECK
Battery Remaining Longevity: 109 mo
Battery Remaining Percentage: 91 %
Battery Voltage: 2.98 V
Brady Statistic AP VP Percent: 1 %
Brady Statistic AP VS Percent: 1 %
Brady Statistic AS VP Percent: 99 %
Brady Statistic AS VS Percent: 1 %
Brady Statistic RA Percent Paced: 1 %
Brady Statistic RV Percent Paced: 99 %
Date Time Interrogation Session: 20251125035650
Implantable Lead Connection Status: 753985
Implantable Lead Connection Status: 753985
Implantable Lead Implant Date: 20240822
Implantable Lead Implant Date: 20240822
Implantable Lead Location: 753859
Implantable Lead Location: 753860
Implantable Pulse Generator Implant Date: 20240822
Lead Channel Impedance Value: 390 Ohm
Lead Channel Impedance Value: 460 Ohm
Lead Channel Pacing Threshold Amplitude: 0.5 V
Lead Channel Pacing Threshold Amplitude: 0.875 V
Lead Channel Pacing Threshold Pulse Width: 0.5 ms
Lead Channel Pacing Threshold Pulse Width: 0.5 ms
Lead Channel Sensing Intrinsic Amplitude: 2 mV
Lead Channel Sensing Intrinsic Amplitude: 7.8 mV
Lead Channel Setting Pacing Amplitude: 1.125
Lead Channel Setting Pacing Amplitude: 1.5 V
Lead Channel Setting Pacing Pulse Width: 0.5 ms
Lead Channel Setting Sensing Sensitivity: 4 mV
Pulse Gen Model: 2272
Pulse Gen Serial Number: 8206065

## 2024-03-08 NOTE — Progress Notes (Signed)
 Remote PPM Transmission

## 2024-03-09 ENCOUNTER — Ambulatory Visit: Payer: Self-pay | Admitting: Cardiology

## 2024-03-13 DIAGNOSIS — I10 Essential (primary) hypertension: Secondary | ICD-10-CM | POA: Diagnosis not present

## 2024-03-13 DIAGNOSIS — I350 Nonrheumatic aortic (valve) stenosis: Secondary | ICD-10-CM | POA: Diagnosis not present

## 2024-03-13 DIAGNOSIS — E1159 Type 2 diabetes mellitus with other circulatory complications: Secondary | ICD-10-CM | POA: Diagnosis not present

## 2024-03-13 DIAGNOSIS — E782 Mixed hyperlipidemia: Secondary | ICD-10-CM | POA: Diagnosis not present

## 2024-03-13 DIAGNOSIS — I4891 Unspecified atrial fibrillation: Secondary | ICD-10-CM | POA: Diagnosis not present

## 2024-03-25 ENCOUNTER — Ambulatory Visit: Attending: Cardiology | Admitting: Cardiology

## 2024-03-25 ENCOUNTER — Encounter: Payer: Self-pay | Admitting: Cardiology

## 2024-03-25 VITALS — BP 136/62 | HR 86 | Ht 64.0 in | Wt 198.0 lb

## 2024-03-25 DIAGNOSIS — Z952 Presence of prosthetic heart valve: Secondary | ICD-10-CM

## 2024-03-25 DIAGNOSIS — Z792 Long term (current) use of antibiotics: Secondary | ICD-10-CM

## 2024-03-25 DIAGNOSIS — I442 Atrioventricular block, complete: Secondary | ICD-10-CM | POA: Diagnosis not present

## 2024-03-25 DIAGNOSIS — Z950 Presence of cardiac pacemaker: Secondary | ICD-10-CM | POA: Diagnosis not present

## 2024-03-25 DIAGNOSIS — I350 Nonrheumatic aortic (valve) stenosis: Secondary | ICD-10-CM | POA: Diagnosis not present

## 2024-03-25 DIAGNOSIS — I5032 Chronic diastolic (congestive) heart failure: Secondary | ICD-10-CM

## 2024-03-25 DIAGNOSIS — I10 Essential (primary) hypertension: Secondary | ICD-10-CM | POA: Diagnosis not present

## 2024-03-25 DIAGNOSIS — E119 Type 2 diabetes mellitus without complications: Secondary | ICD-10-CM

## 2024-03-25 DIAGNOSIS — I480 Paroxysmal atrial fibrillation: Secondary | ICD-10-CM | POA: Diagnosis not present

## 2024-03-25 DIAGNOSIS — Z95818 Presence of other cardiac implants and grafts: Secondary | ICD-10-CM | POA: Diagnosis not present

## 2024-03-25 NOTE — Patient Instructions (Signed)
 Medication Instructions:  Your physician recommends that you continue on your current medications as directed. Please refer to the Current Medication list given to you today.  *If you need a refill on your cardiac medications before your next appointment, please call your pharmacy*  Lab Work: None ordered If you have labs (blood work) drawn today and your tests are completely normal, you will receive your results only by: MyChart Message (if you have MyChart) OR A paper copy in the mail If you have any lab test that is abnormal or we need to change your treatment, we will call you to review the results.  Testing/Procedures: None ordered  Follow-Up: At [facility], you and your health needs are our priority.  As part of our continuing mission to provide you with exceptional heart care, our providers are all part of one team.  This team includes your primary Cardiologist (physician) and Advanced Practice Providers or APPs (Physician Assistants and Nurse Practitioners) who all work together to provide you with the care you need, when you need it.  Your next appointment:   1 year(s)  Provider:   Madonna Large, DO    We recommend signing up for the patient portal called MyChart.  Sign up information is provided on this After Visit Summary.  MyChart is used to connect with patients for Virtual Visits (Telemedicine).  Patients are able to view lab/test results, encounter notes, upcoming appointments, etc.  Non-urgent messages can be sent to your provider as well.   To learn more about what you can do with MyChart, go to [email or web address].

## 2024-03-25 NOTE — Progress Notes (Signed)
 Cardiology Office Note:  .   Date:  03/25/2024  ID:  [name], DOB Sep 24, 1945, MRN [id number] PCP:  Nanci Senior, MD  [facility] Providers Cardiologist:  Madonna Large, [facility] (established care 09/04/2022)  Electrophysiologist:  OLE ONEIDA HOLTS, MD  Structural Heart:  Arun K Thukkani, MD,  Click to update primary MD,subspecialty MD or APP then REFRESH:1}    History of Present Illness: .   [name] is a 78 y.o. Caucasian female whose past medical history and cardiovascular risk factors includes: Aortic stenosis s/p 26 mm Edwards SAPIEN 3 via TF approach, status post pacemaker implant due to complete heart block (August 2024) with Dr. Holts), device detected paroxysmal atrial fibrillation s/p LAAO (06/2023), diabetes mellitus type 2, HTN, former smoker, hx breast cancer x2 (status postmastectomy, radiation, chemotherapy), history of ventricular tachycardia.   Referred to the practice for evaluation of shortness of breath.  She was noted to have aortic stenosis and she underwent 26 mm Edwards SAPIEN TAVR valve implant on December 10, 2022 with Dr. Wendel. Post TAVR she had complete heart block underwent pacemaker implant with Dr. Holts and now enrolled in device clinic.    After device implant noted to have new onset of atrial fibrillation, paroxysmal.  Placed on anticoagulation for thromboembolic prophylaxis and subsequently underwent left atrial appendage occlusion.  She is no longer on anticoagulation.  Post aortic stenosis workup and echocardiographic findings patient did undergo PYP study in October 2024. The study was concerning for cardiac ATTR amyloidosis.  She was seen by advanced heart failure, Dr. Rolan, who felt that scan was equivocal.  Patient was recommended to have a cardiac MR. Cardiac MRI was not highly suggestive of cardiac amyloidosis.  Patient underwent a repeat PYP study in September 2025 which was not suggestive of cardiac ATTR  amyloidosis.  Patient has been followed by Pharm.D. clinic for titration of GDMT.  Last progress note from Pharm.D. clinic from 02/26/2024 reviewed as part of today's visit.  Losartan  was transitioned to Avapro , Farxiga  and torsemide  continued.  Patient advised to take an additional 0.5 tablet of torsemide  if she gains more than 3 pounds in a day or 5 pounds in a week.  Her most recent pacemaker interrogation report from November 2025 reviewed device function is appropriate.    She presents today for 78-month follow-up visit  Denies anginal chest pain or heart failure symptoms. Compliant with medical therapy. Home blood pressures are very well-controlled. Patient states that her weight overall has downtrended significantly.  At 1 time she used to be 227 pounds and now down to 191 pounds.  Unfortunately recently has gained weight back to 198 pounds.  Review of Systems: .   Review of Systems  Constitutional: Positive for weight loss.  Cardiovascular:  Negative for chest pain, claudication, irregular heartbeat, leg swelling, near-syncope, orthopnea, palpitations, paroxysmal nocturnal dyspnea and syncope.  Respiratory:  Negative for shortness of breath.   Hematologic/Lymphatic: Negative for bleeding problem.    Studies Reviewed:    Echocardiogram: 01/12/2023 (finger print)  1. Left ventricular ejection fraction, by estimation, is 55%. The left  ventricle has normal function. Left ventricular endocardial border not  optimally defined to evaluate regional wall motion. There is mild  concentric left ventricular hypertrophy. Left   ventricular diastolic parameters are consistent with Grade I diastolic  dysfunction (impaired relaxation).   2. Right ventricular systolic function is mildly reduced. The right  ventricular size is normal. There is normal pulmonary artery systolic  pressure. The estimated  right ventricular systolic pressure is 27.0 mmHg.   3. Left atrial size was mildly dilated.    4. The mitral valve is normal in structure. Trivial mitral valve  regurgitation. No evidence of mitral stenosis.   5. Bioprosthetic aortic valve s/p TAVR with 26 mm Edwards Sapien THV.  Very poor images, mean gradient 4 mmHg is likely inaccurate. No  peri-valvular leakage noted.   6. The inferior vena cava is dilated in size with >50% respiratory  variability, suggesting right atrial pressure of 8 mmHg.   7. Technically difficult study with very poor images. No apical windows.   11/30/2023  1. Left ventricular ejection fraction, by estimation, is 60 to 65%. The  left ventricle has normal function. Left ventricular endocardial border  not optimally defined to evaluate regional wall motion. There is moderate  asymmetric left ventricular  hypertrophy. Left ventricular diastolic parameters are indeterminate.   2. Right ventricular systolic function is normal. The right ventricular  size is normal. There is normal pulmonary artery systolic pressure.   3. Left atrial size was mildly dilated.   4. The mitral valve is grossly normal. No evidence of mitral valve  regurgitation.   5. The aortic valve has been repaired/replaced. Aortic valve  regurgitation is not visualized. No aortic stenosis is present. There is a  26 mm Sapien prosthetic (TAVR) valve present in the aortic position.  Procedure Date: 12/12/2022. Echo findings are  consistent with normal structure and function of the aortic valve  prosthesis. Aortic valve mean gradient measures 7.0 mmHg.   6. The inferior vena cava is normal in size with greater than 50%  respiratory variability, suggesting right atrial pressure of 3 mmHg.   7. Technically difficult study- patient has poor subcostal imaging,  similar to other images in series.   Stress Testing: Regadenoson  Nuclear stress test 09/12/2022: There is a moderate sized moderate reversible defect in the lateral, inferior and infero-apical regions.  Overall LV systolic function is  abnormal with regional wall motion abnormalities in the same distribution (RCA) Stress LV EF: 46%.  Nondiagnostic ECG stress. The heart rate response was consistent with Regadenoson .  No previous exam available for comparison. Intermediate risk study.   CCTA:  July 2023 1. Annular measurements support a 26 mm S3 or 29 mm Evolut Pro.   2. There is moderate subannular calcium  under the LCC extending to the AMVL.   3. Sufficient coronary to annulus distance.   4. Optimal Fluoroscopic Angle for Delivery: LAO 17 CRA 2   5. Dilated pulmonary artery suggestive of pulmonary hypertension.  Heart Catheterization: 10/14/2022 LM: Normal. No significant disease LAD: Normal. No significant disease Lcx: Dominant. No significant disease RCA: Nondominant. No significant disease   RA: 8 mmHg RV: 49/6 mmHg PA:46/25 mmHg, mPAP 31 mmHg PCW: 17 mmHg   CO: 5.3 L.min CI: 2.6 L/min/m2   Simultaneous LV-AO measurement using Langston cathteer Aortic valve mean PG 25 mmHg, AVA 0.94 cm2   Conclusion: No significant coronary artery disease Mildly elevated filling pressures Mild PH, WHO Grp II Aortic stenosis, moderate by gradients, severe by area. In the correct context, aortic stenosis could possibly be paradoxically low flow low gradient. Alternatively, dyspnea could be due to mild PH, possible diastolic dysfunction. Recommend clinical correlation.  PYP study 01/07/2024  Findings are not suggestive of cardiac ATTR amyloidosis. The myocardium was negative for radiotracer uptake.  The visual grade of myocardial uptake relative to the ribs was Grade 0 (No myocardial uptake and normal bone uptake).  CT  images were obtained for attenuation correction and were examined for the presence of coronary calcium  when appropriate.  Prior study available for comparison from 02/05/2023. Radiology overread 1. No acute or significant extracardiac findings. 2. Cholelithiasis. 3. Tiny hiatal hernia. 4.  Aortic atherosclerosis (ICD10-I70.0).   Carotid duplex: Carotid artery duplex 09/22/2022 Duplex suggests stenosis in the right internal carotid artery (minimal). Duplex suggests stenosis in the left internal carotid artery (1-15%). Both vertebral arteries are patent with antegrade flow. No prior studies for comparison. Follow up in one year is appropriate if clinically indicated.   Risk Assessment/Calculations:   Click Here to Calculate/Change CHADS2VASc Score The patient's CHADS2-VASc score is 6, indicating a 9.7% annual risk of stroke. CHF History: Yes HTN History: Yes Diabetes History: Yes Stroke History: No Vascular Disease History: No  Labs:       Latest Ref Rng & Units 06/10/2023    4:06 PM 03/03/2023   10:19 AM 01/06/2023    1:55 PM  CBC  WBC 3.4 - 10.8 x10E3/uL 6.7  6.6  5.8   Hemoglobin 11.1 - 15.9 g/dL 85.4  86.3  86.7   Hematocrit 34.0 - 46.6 % 44.0  41.8  40.3   Platelets 150 - 450 x10E3/uL 170  178  178        Latest Ref Rng & Units 12/31/2023   10:56 AM 12/15/2023    2:04 PM 08/05/2023    1:08 PM  BMP  Glucose 70 - 99 mg/dL 894  92  895   BUN 8 - 27 mg/dL 21  22  17    Creatinine 0.57 - 1.00 mg/dL 9.13  9.16  9.24   BUN/Creat Ratio 12 - 28 24  27  23    Sodium 134 - 144 mmol/L 141  141  144   Potassium 3.5 - 5.2 mmol/L 4.2  4.2  3.7   Chloride 96 - 106 mmol/L 101  99  98   CO2 20 - 29 mmol/L 23  24  28    Calcium  8.7 - 10.3 mg/dL 9.6  9.5  9.6       Latest Ref Rng & Units 12/31/2023   10:56 AM 12/15/2023    2:04 PM 08/05/2023    1:08 PM  CMP  Glucose 70 - 99 mg/dL 894  92  895   BUN 8 - 27 mg/dL 21  22  17    Creatinine 0.57 - 1.00 mg/dL 9.13  9.16  9.24   Sodium 134 - 144 mmol/L 141  141  144   Potassium 3.5 - 5.2 mmol/L 4.2  4.2  3.7   Chloride 96 - 106 mmol/L 101  99  98   CO2 20 - 29 mmol/L 23  24  28    Calcium  8.7 - 10.3 mg/dL 9.6  9.5  9.6     No results found for: CHOL, HDL, LDLCALC, LDLDIRECT, TRIG, CHOLHDL No results for  input(s): LIPOA in the last 8760 hours. No components found for: NTPROBNP No results for input(s): PROBNP in the last 8760 hours. No results for input(s): TSH in the last 8760 hours.   Physical Exam:    Today's Vitals   03/25/24 1002  BP: 136/62  Pulse: 86  SpO2: 96%  Weight: 198 lb (89.8 kg)  Height: 5' 4 (1.626 m)   Body mass index is 33.99 kg/m. Wt Readings from Last 3 Encounters:  03/25/24 198 lb (89.8 kg)  12/24/23 200 lb (90.7 kg)  11/30/23 195 lb 3.2 oz (88.5 kg)  Physical Exam  Constitutional: No distress.  Appears older than stated age, ambulates with a cane,, hemodynamically stable.   Neck: No JVD present.  Cardiovascular: Normal rate, regular rhythm, S1 normal, S2 normal, intact distal pulses and normal pulses. Exam reveals no gallop, no S3 and no S4.  No murmur heard. Pulmonary/Chest: Effort normal and breath sounds normal. No stridor. She has no wheezes. She has no rales.  Bilateral mastectomies, left sided pacemaker.   Abdominal: Soft. Bowel sounds are normal. She exhibits no distension. There is no abdominal tenderness.  Musculoskeletal:        General: Edema present. No tenderness.     Cervical back: Neck supple.  Neurological: She is alert and oriented to person, place, and time. She has intact cranial nerves (2-12).  Skin: Skin is warm and moist.     Impression & Recommendation(s):  Impression:   ICD-10-CM   1. Paroxysmal A-fib (HCC)  I48.0     2. Presence of Watchman left atrial appendage closure device  Z95.818     3. Nonrheumatic aortic valve stenosis  I35.0     4. S/P TAVR (transcatheter aortic valve replacement)  Z95.2     5. Need for antibiotic prophylaxis for dental procedure  Z79.2     6. Complete heart block (HCC)  I44.2     7. Presence of permanent cardiac pacemaker  Z95.0     8. Chronic diastolic CHF (congestive heart failure) (HCC)  I50.32     9. Diabetes mellitus without complication (HCC)  E11.9     10. Essential  hypertension  I10         Recommendation(s):  Paroxysmal A-fib (HCC) Presence of Watchman left atrial appendage closure device Overall doing well from atrial fibrillation standpoint, no significant burden. Given her high CHA2DS2-VASc SCORE and oral anticoagulation being cost prohibitive patient underwent watchman implant. Cardiac CT May 2025 confirms no peridevice leak and no device related thrombus.  Nonrheumatic aortic valve stenosis S/P TAVR (transcatheter aortic valve replacement) Need for antibiotic prophylaxis for dental procedure December 02, 2022:  Edwards Sapien 3 Ultra Resilia THV (size 26 mm, model # 9755RSL, serial # [id number])  Fingerprint echo January 12 2023 Patient is aware that she needs antibiotic prophylaxis. Remains on aspirin  81 mg p.o. daily  Complete heart block (HCC) Presence of permanent cardiac pacemaker Patient implanted in August 2024 with Dr. Cindie, dual-chamber pacemaker. Currently enrolled in the pacemaker clinic. Last device check results reviewed -normal device function  Chronic diastolic heart failure (HCC) Abnormal electrocardiogram (ECG) (EKG) NYHA class II Euvolemic on physical examination. Continue Avapro  300 mg p.o. daily. Continue Farxiga  10 mg p.o. daily. Continue torsemide  10 mg p.o. daily. Continue to follow-up with Pharm.D. clinic for GDMT titration and follow-up She has undergone 2 PYP studies which note no significant findings to suggest ATTR amyloidosis at this time. Has undergone cardiac MRI as well findings not suggestive of cardiac amyloidosis Molecular genetic report for familial amyloidosis hATTR via TTR gene reported negative (see media section 03/03/2023).  Diabetes mellitus without complication (HCC) Currently on Farxiga , ARB, statin, Ozempic  Essential hypertension Office blood pressures are acceptable. Medication changes as discussed above  Orders Placed:  No orders of the defined types were placed in this  encounter.   Final Medication List:    No orders of the defined types were placed in this encounter.   There are no discontinued medications.    Current Outpatient Medications:    albuterol  (VENTOLIN  HFA) 108 (90 Base) MCG/ACT inhaler, Inhale  2 puffs into the lungs every 4 (four) hours as needed for wheezing or shortness of breath., Disp: , Rfl:    aspirin  EC 81 MG tablet, Take 1 tablet (81 mg total) by mouth daily. Swallow whole., Disp: , Rfl:    azithromycin  (ZITHROMAX ) 500 MG tablet, Take 1 tablet (500 mg total) by mouth once as needed for up to 1 dose (Take lifelong due to TAVR valve)., Disp: 6 tablet, Rfl: 6   Cholecalciferol (VITAMIN D3) 125 MCG (5000 UT) TABS, Take 5,000 Units by mouth daily., Disp: , Rfl:    Cyanocobalamin (VITAMIN B 12 PO), Take 1,000 mcg by mouth daily at 12 noon., Disp: , Rfl:    FARXIGA  10 MG TABS tablet, Take 1 tablet (10 mg total) by mouth daily. PLEASE SCHEDULE APPOINTMENT FOR MORE REFILLS CALL [phone] OPTION 2, Disp: 90 tablet, Rfl: 3   irbesartan  (AVAPRO ) 300 MG tablet, Take 1 tablet (300 mg total) by mouth daily. STOP losartan , Disp: 90 tablet, Rfl: 3   Magnesium  400 MG CAPS, Take 400 mg by mouth daily., Disp: , Rfl:    meloxicam (MOBIC) 15 MG tablet, Take 15 mg by mouth daily., Disp: , Rfl:    Misc Natural Products (ELDERBERRY IMMUNE COMPLEX) CHEW, Chew 1 each by mouth daily., Disp: , Rfl:    Multiple Vitamin (MULTIVITAMINS PO), Take 1 tablet by mouth daily., Disp: , Rfl:    OZEMPIC, 0.25 OR 0.5 MG/DOSE, 2 MG/3ML SOPN, Inject 0.5 mg into the skin every Saturday., Disp: , Rfl:    pantoprazole  (PROTONIX ) 40 MG tablet, Take 40 mg by mouth daily., Disp: , Rfl:    polyvinyl alcohol (LIQUIFILM TEARS) 1.4 % ophthalmic solution, Place 1 drop into both eyes daily., Disp: , Rfl:    rosuvastatin  (CRESTOR ) 5 MG tablet, Take 5 mg by mouth daily., Disp: , Rfl:    torsemide  (DEMADEX ) 10 MG tablet, Take 1 tablet (10 mg total) by mouth daily., Disp: 90 tablet, Rfl:  1  Consent:   NA  Disposition:   1 year follow-up sooner if needed  Her questions and concerns were addressed to her satisfaction. She voices understanding of the recommendations provided during this encounter.    Signed, Madonna Michele HAS, [facility]  A Division of [facility] [address], [place], [place] [address]

## 2024-03-26 LAB — BASIC METABOLIC PANEL WITH GFR
BUN/Creatinine Ratio: 23 (ref 12–28)
BUN: 21 mg/dL (ref 8–27)
CO2: 23 mmol/L (ref 20–29)
Calcium: 9.5 mg/dL (ref 8.7–10.3)
Chloride: 99 mmol/L (ref 96–106)
Creatinine, Ser: 0.91 mg/dL (ref 0.57–1.00)
Glucose: 106 mg/dL — ABNORMAL HIGH (ref 70–99)
Potassium: 4.3 mmol/L (ref 3.5–5.2)
Sodium: 138 mmol/L (ref 134–144)
eGFR: 65 mL/min/1.73 (ref >59)

## 2024-03-28 ENCOUNTER — Ambulatory Visit: Payer: Self-pay | Admitting: Pharmacist

## 2024-04-01 ENCOUNTER — Telehealth (HOSPITAL_BASED_OUTPATIENT_CLINIC_OR_DEPARTMENT_OTHER): Payer: Self-pay | Admitting: *Deleted

## 2024-04-01 ENCOUNTER — Encounter: Payer: Self-pay | Admitting: Cardiology

## 2024-04-01 ENCOUNTER — Telehealth: Payer: Self-pay

## 2024-04-01 NOTE — Progress Notes (Signed)
 Error

## 2024-04-01 NOTE — Telephone Encounter (Signed)
"  ° °  Pre-operative Risk Assessment    Patient Name: [name]  DOB: 01/18/1946 MRN: [id number]   Date of last office visit: 03/25/24 DR. TOLIA Date of next office visit: NONE   Request for Surgical Clearance    Procedure:  RIGHT LONG A1 PULLEY RELEASE; RIGHT HAND CARPAL TUNNEL REVISION CARPAL TUNNEL RELEASE  Date of Surgery:  Clearance 04/25/24                                Surgeon:  DR. FRED [facility] Surgeon's Group or Practice Name:  [facility] Phone number:  [phone] MEGAN DAVIS Fax number:  [phone]   Type of Clearance Requested:   - Medical  - Pharmacy:  Hold Aspirin      Type of Anesthesia:  CHOICE   Additional requests/questions:    Signed, Kristopher Attwood   04/01/2024, 3:28 PM   "

## 2024-04-01 NOTE — Progress Notes (Signed)
 PERIOPERATIVE PRESCRIPTION FOR IMPLANTED CARDIAC DEVICE PROGRAMMING  Patient Information: Name:  [name]  DOB:  Oct 29, 1945  MRN:  [id number]  Procedure:  RIGHT LONG A1 PULLEY RELEASE; RIGHT HAND CARPAL TUNNEL REVISION CARPAL TUNNEL RELEASE   Date of Surgery:  Clearance 04/25/24                                  Surgeon:  DR. FRED [facility] Surgeon's Group or Practice Name:  [facility] Phone number:  [phone] MEGAN DAVIS Fax number:  [phone]   Type of Clearance Requested:   - Medical  - Pharmacy:  Hold Aspirin      Type of Anesthesia:  CHOICE  Device Information:  Clinic EP Physician:  Dr. Fonda Kitty  Device Type:  Pacemaker Manufacturer and Phone #:  St. Jude/Abbott: [phone] Pacemaker Dependent?:  Yes.   Date of Last Device Check:  03/08/2024 Normal Device Function?:  Yes.    Electrophysiologist's Recommendations:  Have magnet available. Provide continuous ECG monitoring when magnet is used or reprogramming is to be performed.  Procedure may interfere with device function.  Magnet should be placed over device during procedure.  Per Device Clinic Standing Orders, Delon DELENA Sharps, RN  4:15 PM 04/01/2024

## 2024-04-01 NOTE — Telephone Encounter (Signed)
 error

## 2024-04-04 NOTE — Telephone Encounter (Signed)
 Acceptable risk for upcoming noncardiac surgery. May hold aspirin  7 days prior to the procedure and restart as per the surgeons recommendation once hemostasis is achieved.  Dr. Akina Maish

## 2024-04-04 NOTE — Telephone Encounter (Signed)
"  ° °  Patient Name: [name]  DOB: 1945-08-22 MRN: [id number]  Primary Cardiologist: Madonna Large, DO  Chart reviewed as part of pre-operative protocol coverage. Per Dr. Large, Acceptable risk for upcoming noncardiac surgery. May hold aspirin  7 days prior to the procedure and restart as per the surgeons recommendation once hemostasis is achieved. Device clinic has already completed periop device documentation in a separate note.  Will route this bundled recommendation to requesting provider via Epic fax function. Please call with questions.   Corbet Hanley N Ayad Nieman, PA-C 04/04/2024, 11:36 AM   "

## 2024-04-15 ENCOUNTER — Other Ambulatory Visit (HOSPITAL_COMMUNITY): Payer: Self-pay

## 2024-04-15 ENCOUNTER — Telehealth (HOSPITAL_COMMUNITY): Payer: Self-pay

## 2024-04-15 NOTE — Telephone Encounter (Signed)
 Advanced Heart Failure Patient Advocate Encounter  The patient was approved for a [employer] grant that will help cover the cost of Eliquis , Farxiga , Losartan .  Total amount awarded, $7,500.  Effective: 03/16/2024 - 03/15/2025.  BIN [id number] PCN [id number] Group [id number] ID [id number]  Patient informed via voicemail and [employer].  Rachel DEL, CPhT Rx Patient Advocate Phone: [phone]

## 2024-04-20 ENCOUNTER — Ambulatory Visit: Attending: Cardiovascular Disease | Admitting: Pharmacist

## 2024-04-20 DIAGNOSIS — I10 Essential (primary) hypertension: Secondary | ICD-10-CM | POA: Diagnosis not present

## 2024-04-20 MED ORDER — SPIRONOLACTONE 25 MG PO TABS: 25.0000 mg | ORAL_TABLET | Freq: Every day | ORAL | 11 refills | Status: AC

## 2024-04-20 NOTE — Patient Instructions (Addendum)
 After procedure, start taking spironolactone  25mg  daily Continue irbesartan  300mg  daily Please have labs rechecked 1/26 Please write down your blood pressure readings and bring them to your next appointment.   Your blood pressure goal is < 130/52mmHg   Important lifestyle changes to control high blood pressure  Intervention  Effect on the BP   Weight loss Weight loss is one of the most effective lifestyle changes for controlling blood pressure. If you're overweight or obese, losing even a small amount of weight can help reduce blood pressure.    Blood pressure can decrease by 1 millimeter of mercury (mmHg) with each kilogram (about 2.2 pounds) of weight lost.   Exercise regularly As a general goal, aim for 30 minutes of moderate physical activity every day.    Regular physical activity can lower blood pressure by 5 - 8 mmHg.   Eat a healthy diet Eat a diet rich in whole grains, fruits, vegetables, lean meat, and low-fat dairy products. Limit processed foods, saturated fat, and sweets.    A heart-healthy diet can lower high blood pressure by 10 mmHg.   Reduce salt (sodium) in your diet Aim for 000mg  of sodium each day. Avoid deli meats, canned food, and frozen microwave meals which are high in sodium.     Limiting sodium can reduce blood pressure by 5 mmHg.   Limit alcohol One drink equals 12 ounces of beer, 5 ounces of wine, or 1.5 ounces of 80-proof liquor.    Limiting alcohol to < 1 drink a day for women or < 2 drinks a day for men can help lower blood pressure by about 4 mmHg.   To check your pressure at home you will need to:   Sit up in a chair, with feet flat on the floor and back supported. Do not cross your ankles or legs. Rest your left arm so that the cuff is about heart level. If the cuff goes on your upper arm, then just relax your arm on the table, arm of the chair, or your lap. If you have a wrist cuff, hold your wrist against your chest at heart  level. Place the cuff snugly around your arm, about 1 inch above the crease of your elbow. The cords should be inside the groove of your elbow.  Sit quietly, with the cuff in place, for about 5 minutes. Then press the power button to start a reading. Do not talk or move while the reading is taking place.  Record your readings on a sheet of paper. Although most cuffs have a memory, it is often easier to see a pattern developing when the numbers are all in front of you.  You can repeat the reading after 1-3 minutes if it is recommended.   Make sure your bladder is empty and you have not had caffeine or tobacco within the last 30 minutes   Always bring your blood pressure log with you to your appointments. If you have not brought your monitor in to be double checked for accuracy, please bring it to your next appointment.   You can find a list of validated (accurate) blood pressure cuffs at: validatebp.org

## 2024-04-20 NOTE — Assessment & Plan Note (Signed)
 Assessment: Blood pressure in clinic above goal of less than 130/80 Although I do not have any specific home readings it sounds as though it is above goal most of the time at home as well Home blood pressure cuff found to be accurate Patient with HFrEF  Plan: Start spironolactone  25 mg daily Continue irbesartan  300 mg daily, Farxiga  10 mg daily and torsemide  10 mg daily Repeat BMP in about 2 weeks Follow-up in office in 2 months

## 2024-04-20 NOTE — Progress Notes (Signed)
 Patient ID: [name]                 DOB: January 12, 1946                      MRN: [id number]     HPI: [name] is a 79 y.o. female referred by Dr. Michele to pharmacy clinic for HF medication management. PMH is significant for Aortic stenosis s/p 26 mm Edwards SAPIEN 3 via TF approach, status post pacemaker implant due to complete heart block (August 2024) with Dr. Cindie), device detected paroxysmal atrial fibrillation s/p LAAO (06/2023), diabetes mellitus type 2, HTN, former smoker, hx breast cancer x2 (status postmastectomy, radiation, chemotherapy), history of ventricular tachycardia, HFpEF. Most recent LVEF 60-65% on 11/30/23.  At apt with Dr. Michele 12/24/23, her lisinopril /hydrochlorothiazide  was stopped and she started on losartan  25mg  daily and torsemide  10mg  daily.    At [name] visit 10/10 she reported  that her blood pressure at home has been 130-135/70-75 with monitor provided by PCP (that reports readings to PCP).  Her personal blood pressure cuff gives her readings of 116-125/70. Losartan  was increased to 50mg  daily.    At last PharmD visit, BP was 140/80. The few home readings reported were variable. Losartan  was changed to irbesartan  300mg  daily. She was provided instructions on what to do if she had increase swelling. Does wear calf high compression socks.   She was seen by Dr. Michele 03/25/24. Office BP reported to be well controlled. Follow up BMP was stable.  Patient presents today for follow up.  She brings in her home blood pressure cuff which was found to be accurate.  She reports variable blood pressures at home but does not have exact readings for me.  Does sound like it is above 130/80.  She is taking meloxicam for pain but reports not every day.  Denies any dizziness or lightheadedness.  Rare headache.  Some swelling, wears compression stockings.  Home BP machine: 151/77 HR 83 Home BP machine: 152/75 HR 84 Manual clinic: 148/76 Home BP machine: 132/72 HR 82 Manual  130/80  Current CHF meds: irbesartan  300mg  daily, Farxiga  10mg  daily, torsemide  10mg  daily Previously tried:   BP goal: <130/80  Family History:  Family History  Problem Relation Age of Onset   Dementia Mother    Prostate cancer Father    Arrhythmia Sister    Hypertension Sister    Breast cancer Paternal Aunt    Hypertension Maternal Grandmother    Heart Problems Maternal Grandfather    Diabetes Paternal Grandmother    Heart Problems Paternal Grandmother    Blindness Paternal Grandfather    Neuropathy Neg Hx     Social History: Not discussed today  Diet: cheese and crackers Doesn't add salt most of the time Cooks at home Coffee in AM 1-2 cups Coke zero 1/2 bottle daily Homemade lemonade with stevia  (Diets 3 days per week)  Breakfast: 5 saltines and a boiled egg Apple and toast with protein Lunch: Protein and two vegetables Dinner: Protein and several vegetables Drinks black tea and maybe one diet soda day.   Exercise: walks 40 min daily with her dog, carioglider MWF  Home BP readings:  No specific readings available  Wt Readings from Last 3 Encounters:  03/25/24 198 lb (89.8 kg)  12/24/23 200 lb (90.7 kg)  11/30/23 195 lb 3.2 oz (88.5 kg)   BP Readings from Last 3 Encounters:  03/25/24 136/62  02/26/24 (!) 140/80  01/22/24 136/80   Pulse Readings from Last 3 Encounters:  03/25/24 86  02/26/24 (!) 103  01/22/24 78    Renal function: CrCl cannot be calculated (Patient's most recent lab result is older than the maximum 21 days allowed.).  Past Medical History:  Diagnosis Date   Allergic rhinitis    Asthma    Childhood per patient   Breast cancer, left (HCC) 2007   ductal carcinoma in situ, underwent left mastectomy 07/2005; reconstructive surgery with expander, permanent implant placed 03/2006, hospitalized January 2008 with breast cellulitis.   Breast cancer, right (HCC) 1998   1998; modified radical right mastectomy 1998 for infiltrating ductal  adenocarcinoma with 7 of 12 axillary nodes positive. Underwent adjuvant chemotherapy and bone marrow transplant by Dr Seward as well as radiation therapy. Also had Tram flap reconstruction.   Diabetes mellitus without complication (HCC)    GERD (gastroesophageal reflux disease)    Hiatal hernia    History of ventricular tachycardia    Hypertension    Vitamin [name] deficiency     Current Outpatient Medications on File Prior to Visit  Medication Sig Dispense Refill   acetaminophen  (APAP EXTRA STRENGTH) 500 MG tablet Take 500 mg by mouth every 6 (six) hours as needed.     aspirin  EC 81 MG tablet Take 1 tablet (81 mg total) by mouth daily. Swallow whole.     azithromycin  (ZITHROMAX ) 500 MG tablet Take 1 tablet (500 mg total) by mouth once as needed for up to 1 dose (Take lifelong due to TAVR valve). 6 tablet 6   Cholecalciferol (VITAMIN D3) 50 MCG (2000 UT) capsule Take 2,000 Units by mouth daily.     Cyanocobalamin (VITAMIN B 12 PO) Take 1,000 mcg by mouth daily at 12 noon.     FARXIGA  10 MG TABS tablet Take 1 tablet (10 mg total) by mouth daily. PLEASE SCHEDULE APPOINTMENT FOR MORE REFILLS CALL [phone] OPTION 2 90 tablet 3   irbesartan  (AVAPRO ) 300 MG tablet Take 1 tablet (300 mg total) by mouth daily. STOP losartan  90 tablet 3   Magnesium  250 MG TABS Take 1 tablet by mouth daily.     meloxicam (MOBIC) 15 MG tablet Take 15 mg by mouth daily.     Misc Natural Products (ELDERBERRY IMMUNE COMPLEX) CHEW Chew 1 each by mouth daily.     Multiple Vitamin (MULTIVITAMINS PO) Take 1 tablet by mouth daily.     OZEMPIC, 0.25 OR 0.5 MG/DOSE, 2 MG/3ML SOPN Inject 0.5 mg into the skin every Saturday.     pantoprazole  (PROTONIX ) 40 MG tablet Take 40 mg by mouth daily.     polyvinyl alcohol (LIQUIFILM TEARS) 1.4 % ophthalmic solution Place 1 drop into both eyes daily.     rosuvastatin  (CRESTOR ) 5 MG tablet Take 5 mg by mouth daily.     torsemide  (DEMADEX ) 10 MG tablet Take 1 tablet (10 mg total) by mouth daily.  90 tablet 1   albuterol  (VENTOLIN  HFA) 108 (90 Base) MCG/ACT inhaler Inhale 2 puffs into the lungs every 4 (four) hours as needed for wheezing or shortness of breath.     No current facility-administered medications on file prior to visit.    Allergies  Allergen Reactions   Penicillins Shortness Of Breath and Rash   Bee Venom Swelling    Bee stings   Morphine Other (See Comments) and Nausea And Vomiting     Assessment/Plan:  1. CHF -  Hypertension Assessment: Blood pressure in clinic above goal of less than  130/80 Although I do not have any specific home readings it sounds as though it is above goal most of the time at home as well Home blood pressure cuff found to be accurate Patient with HFrEF  Plan: Start spironolactone  25 mg daily Continue irbesartan  300 mg daily, Farxiga  10 mg daily and torsemide  10 mg daily Repeat BMP in about 2 weeks Follow-up in office in 2 months    [name], Pharm.[name], CPP [facility] A [facility] of [facility] [address], [place], [place] [address]  Phone: [phone]; Fax: [phone]

## 2024-05-11 ENCOUNTER — Telehealth: Payer: Self-pay

## 2024-05-11 NOTE — Telephone Encounter (Signed)
 Called to check in with patient, who had LAAO on 06/18/2023. The patient reports doing well with no issues.  The patient understands to call with questions or concerns.

## 2024-05-17 ENCOUNTER — Encounter: Payer: Self-pay | Admitting: Pharmacist

## 2024-06-06 ENCOUNTER — Ambulatory Visit: Admitting: Pharmacist
